# Patient Record
Sex: Male | Born: 1960 | Hispanic: Yes | Marital: Single | Smoking: Never smoker
Health system: Southern US, Community
[De-identification: ages and names within clinical notes are randomized; demographics above are authoritative.]

## PROBLEM LIST (undated history)

## (undated) DIAGNOSIS — I5032 Chronic diastolic (congestive) heart failure: Secondary | ICD-10-CM

## (undated) DIAGNOSIS — I10 Essential (primary) hypertension: Secondary | ICD-10-CM

## (undated) DIAGNOSIS — E039 Hypothyroidism, unspecified: Secondary | ICD-10-CM

## (undated) DIAGNOSIS — E119 Type 2 diabetes mellitus without complications: Secondary | ICD-10-CM

## (undated) DIAGNOSIS — I2510 Atherosclerotic heart disease of native coronary artery without angina pectoris: Secondary | ICD-10-CM

## (undated) DIAGNOSIS — G4733 Obstructive sleep apnea (adult) (pediatric): Secondary | ICD-10-CM

## (undated) DIAGNOSIS — D649 Anemia, unspecified: Secondary | ICD-10-CM

## (undated) DIAGNOSIS — I4819 Other persistent atrial fibrillation: Secondary | ICD-10-CM

## (undated) DIAGNOSIS — E785 Hyperlipidemia, unspecified: Secondary | ICD-10-CM

## (undated) DIAGNOSIS — N183 Chronic kidney disease, stage 3 unspecified: Secondary | ICD-10-CM

## (undated) DIAGNOSIS — I712 Thoracic aortic aneurysm, without rupture, unspecified: Secondary | ICD-10-CM

## (undated) DIAGNOSIS — I4510 Unspecified right bundle-branch block: Secondary | ICD-10-CM

## (undated) DIAGNOSIS — I480 Paroxysmal atrial fibrillation: Secondary | ICD-10-CM

## (undated) DIAGNOSIS — I358 Other nonrheumatic aortic valve disorders: Secondary | ICD-10-CM

## (undated) DIAGNOSIS — G8929 Other chronic pain: Secondary | ICD-10-CM

## (undated) DIAGNOSIS — R7989 Other specified abnormal findings of blood chemistry: Secondary | ICD-10-CM

## (undated) HISTORY — PX: CORONARY ARTERY BYPASS GRAFT: SHX141

## (undated) HISTORY — PX: AORTIC VALVE REPLACEMENT: SHX41

---
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
## 2012-07-03 LAB — COMPREHENSIVE METABOLIC PANEL
Albumin: 3.6 g/dL (ref 3.4–5.0)
Alkaline Phosphatase: 118 U/L (ref 50–136)
Anion Gap: 11 (ref 7–16)
Calcium, Total: 7.9 mg/dL — ABNORMAL LOW (ref 8.5–10.1)
Chloride: 109 mmol/L — ABNORMAL HIGH (ref 98–107)
EGFR (African American): >60
EGFR (Non-African Amer.): >60
Glucose: 130 mg/dL — ABNORMAL HIGH (ref 65–99)
Osmolality: 286 (ref 275–301)
Potassium: 3.4 mmol/L — ABNORMAL LOW (ref 3.5–5.1)
SGOT(AST): 29 U/L (ref 15–37)
Sodium: 142 mmol/L (ref 136–145)
Total Protein: 7.4 g/dL (ref 6.4–8.2)

## 2012-07-03 LAB — TROPONIN I: Troponin-I: 0.05 ng/mL

## 2012-07-03 LAB — PRO B NATRIURETIC PEPTIDE: B-Type Natriuretic Peptide: 1026 pg/mL — ABNORMAL HIGH (ref 0–125)

## 2012-07-03 LAB — CBC
HCT: 37.1 % — ABNORMAL LOW (ref 40.0–52.0)
HGB: 12.3 g/dL — ABNORMAL LOW (ref 13.0–18.0)
MCHC: 33.2 g/dL (ref 32.0–36.0)
MCV: 89 fL (ref 80–100)
RBC: 4.16 10*6/uL — ABNORMAL LOW (ref 4.40–5.90)
WBC: 18.9 10*3/uL — ABNORMAL HIGH (ref 3.8–10.6)

## 2012-07-03 LAB — CK TOTAL AND CKMB (NOT AT ARMC): CK, Total: 311 U/L — ABNORMAL HIGH (ref 35–232)

## 2012-07-04 ENCOUNTER — Inpatient Hospital Stay: Payer: Self-pay | Admitting: Family Medicine

## 2012-07-04 LAB — CBC WITH DIFFERENTIAL/PLATELET
Basophil #: 0.1 10*3/uL (ref 0.0–0.1)
Eosinophil %: 1.1 %
HCT: 34.1 % — ABNORMAL LOW (ref 40.0–52.0)
HGB: 11.2 g/dL — ABNORMAL LOW (ref 13.0–18.0)
Lymphocyte %: 13.1 %
MCHC: 32.8 g/dL (ref 32.0–36.0)
MCV: 89 fL (ref 80–100)
Monocyte %: 7.9 %
Neutrophil #: 12.5 10*3/uL — ABNORMAL HIGH (ref 1.4–6.5)
Neutrophil %: 77.4 %
RBC: 3.84 10*6/uL — ABNORMAL LOW (ref 4.40–5.90)
RDW: 14.2 % (ref 11.5–14.5)
WBC: 16.1 10*3/uL — ABNORMAL HIGH (ref 3.8–10.6)

## 2012-07-04 LAB — COMPREHENSIVE METABOLIC PANEL
Alkaline Phosphatase: 112 U/L (ref 50–136)
Calcium, Total: 7.7 mg/dL — ABNORMAL LOW (ref 8.5–10.1)
Co2: 28 mmol/L (ref 21–32)
Creatinine: 1.12 mg/dL (ref 0.60–1.30)
EGFR (African American): >60
EGFR (Non-African Amer.): >60
Glucose: 99 mg/dL (ref 65–99)
Osmolality: 282 (ref 275–301)
Potassium: 3.2 mmol/L — ABNORMAL LOW (ref 3.5–5.1)
Sodium: 141 mmol/L (ref 136–145)

## 2012-07-04 LAB — LIPID PANEL
Cholesterol: 126 mg/dL (ref 0–200)
HDL Cholesterol: 24 mg/dL — ABNORMAL LOW (ref 40–60)
Triglycerides: 75 mg/dL (ref 0–200)

## 2012-07-04 LAB — TROPONIN I
Troponin-I: 0.36 ng/mL — ABNORMAL HIGH
Troponin-I: 1.7 ng/mL — ABNORMAL HIGH

## 2012-07-04 LAB — CK TOTAL AND CKMB (NOT AT ARMC)
CK, Total: 298 U/L — ABNORMAL HIGH (ref 35–232)
CK-MB: 4.2 ng/mL — ABNORMAL HIGH (ref 0.5–3.6)

## 2012-07-04 LAB — TSH: Thyroid Stimulating Horm: 1.37 u[IU]/mL

## 2012-07-05 LAB — CBC WITH DIFFERENTIAL/PLATELET
Eosinophil #: 0.8 10*3/uL — ABNORMAL HIGH (ref 0.0–0.7)
Eosinophil %: 6.7 %
HCT: 36.6 % — ABNORMAL LOW (ref 40.0–52.0)
HGB: 12.2 g/dL — ABNORMAL LOW (ref 13.0–18.0)
MCV: 88 fL (ref 80–100)
Monocyte #: 0.8 x10 3/mm (ref 0.2–1.0)
Monocyte %: 6.5 %
Neutrophil %: 75.1 %
Platelet: 250 10*3/uL (ref 150–440)
RDW: 14.1 % (ref 11.5–14.5)
WBC: 12.1 10*3/uL — ABNORMAL HIGH (ref 3.8–10.6)

## 2012-07-05 LAB — CK TOTAL AND CKMB (NOT AT ARMC): CK-MB: 1.7 ng/mL (ref 0.5–3.6)

## 2012-07-05 LAB — BASIC METABOLIC PANEL
Anion Gap: 6 — ABNORMAL LOW (ref 7–16)
Calcium, Total: 7.9 mg/dL — ABNORMAL LOW (ref 8.5–10.1)
Chloride: 98 mmol/L (ref 98–107)
Co2: 32 mmol/L (ref 21–32)
Glucose: 109 mg/dL — ABNORMAL HIGH (ref 65–99)
Osmolality: 273 (ref 275–301)
Sodium: 136 mmol/L (ref 136–145)

## 2012-07-05 LAB — HEMOGLOBIN
HGB: 12.3 g/dL — ABNORMAL LOW (ref 13.0–18.0)
HGB: 12.5 g/dL — ABNORMAL LOW (ref 13.0–18.0)

## 2012-07-05 LAB — LIPID PANEL
Cholesterol: 114 mg/dL (ref 0–200)
Ldl Cholesterol, Calc: 67 mg/dL (ref 0–100)
VLDL Cholesterol, Calc: 21 mg/dL (ref 5–40)

## 2012-07-05 LAB — TROPONIN I: Troponin-I: 1.1 ng/mL — ABNORMAL HIGH

## 2012-07-05 LAB — POTASSIUM: Potassium: 3.1 mmol/L — ABNORMAL LOW (ref 3.5–5.1)

## 2012-07-06 LAB — BASIC METABOLIC PANEL
Anion Gap: 7 (ref 7–16)
BUN: 11 mg/dL (ref 7–18)
Chloride: 104 mmol/L (ref 98–107)
Co2: 29 mmol/L (ref 21–32)
Creatinine: 0.99 mg/dL (ref 0.60–1.30)
EGFR (Non-African Amer.): >60
Osmolality: 279 (ref 275–301)
Potassium: 3.4 mmol/L — ABNORMAL LOW (ref 3.5–5.1)

## 2012-07-06 LAB — MAGNESIUM: Magnesium: 2.3 mg/dL

## 2012-07-06 LAB — HEMOGLOBIN: HGB: 12.4 g/dL — ABNORMAL LOW (ref 13.0–18.0)

## 2012-07-09 LAB — CULTURE, BLOOD (SINGLE)

## 2012-10-21 ENCOUNTER — Inpatient Hospital Stay: Payer: Self-pay | Admitting: Internal Medicine

## 2012-10-21 LAB — CBC
HGB: 12.4 g/dL — ABNORMAL LOW (ref 13.0–18.0)
MCH: 29.2 pg (ref 26.0–34.0)
Platelet: 205 10*3/uL (ref 150–440)
RBC: 4.24 10*6/uL — ABNORMAL LOW (ref 4.40–5.90)
RDW: 14.1 % (ref 11.5–14.5)
WBC: 13.7 10*3/uL — ABNORMAL HIGH (ref 3.8–10.6)

## 2012-10-21 LAB — COMPREHENSIVE METABOLIC PANEL
BUN: 14 mg/dL (ref 7–18)
Calcium, Total: 8.5 mg/dL (ref 8.5–10.1)
Co2: 28 mmol/L (ref 21–32)
Creatinine: 1.2 mg/dL (ref 0.60–1.30)
EGFR (African American): >60
Glucose: 118 mg/dL — ABNORMAL HIGH (ref 65–99)
SGPT (ALT): 18 U/L (ref 12–78)
Total Protein: 7.4 g/dL (ref 6.4–8.2)

## 2012-10-21 LAB — HEMOGLOBIN A1C: Hemoglobin A1C: 5.9 % (ref 4.2–6.3)

## 2012-10-21 LAB — PROTIME-INR: INR: 1.1

## 2012-10-21 LAB — CK TOTAL AND CKMB (NOT AT ARMC): CK-MB: 1.4 ng/mL (ref 0.5–3.6)

## 2012-10-21 LAB — LIPID PANEL
HDL Cholesterol: 31 mg/dL — ABNORMAL LOW (ref 40–60)
Triglycerides: 87 mg/dL (ref 0–200)
VLDL Cholesterol, Calc: 17 mg/dL (ref 5–40)

## 2012-10-21 LAB — TROPONIN I: Troponin-I: 0.31 ng/mL — ABNORMAL HIGH

## 2012-10-21 LAB — PRO B NATRIURETIC PEPTIDE: B-Type Natriuretic Peptide: 4511 pg/mL — ABNORMAL HIGH (ref 0–125)

## 2012-10-22 LAB — CBC WITH DIFFERENTIAL/PLATELET
Basophil %: 0.1 %
Eosinophil #: 0 10*3/uL (ref 0.0–0.7)
HCT: 37.2 % — ABNORMAL LOW (ref 40.0–52.0)
Lymphocyte #: 0.9 10*3/uL — ABNORMAL LOW (ref 1.0–3.6)
MCH: 29.2 pg (ref 26.0–34.0)
Monocyte #: 0.9 x10 3/mm (ref 0.2–1.0)
Monocyte %: 5.8 %
Neutrophil #: 14.4 10*3/uL — ABNORMAL HIGH (ref 1.4–6.5)
RBC: 4.39 10*6/uL — ABNORMAL LOW (ref 4.40–5.90)
RDW: 14.4 % (ref 11.5–14.5)
WBC: 16.2 10*3/uL — ABNORMAL HIGH (ref 3.8–10.6)

## 2012-10-22 LAB — BASIC METABOLIC PANEL
Anion Gap: 9 (ref 7–16)
Chloride: 102 mmol/L (ref 98–107)
Co2: 26 mmol/L (ref 21–32)
Creatinine: 0.92 mg/dL (ref 0.60–1.30)
EGFR (Non-African Amer.): >60
Glucose: 138 mg/dL — ABNORMAL HIGH (ref 65–99)

## 2012-10-22 LAB — CK TOTAL AND CKMB (NOT AT ARMC)
CK, Total: 76 U/L (ref 35–232)
CK-MB: 0.6 ng/mL (ref 0.5–3.6)

## 2012-12-04 ENCOUNTER — Inpatient Hospital Stay: Payer: Self-pay | Admitting: Internal Medicine

## 2012-12-04 LAB — COMPREHENSIVE METABOLIC PANEL
Albumin: 4 g/dL (ref 3.4–5.0)
Alkaline Phosphatase: 110 U/L (ref 50–136)
Anion Gap: 7 (ref 7–16)
BUN: 14 mg/dL (ref 7–18)
Bilirubin,Total: 0.6 mg/dL (ref 0.2–1.0)
Calcium, Total: 8.9 mg/dL (ref 8.5–10.1)
Chloride: 102 mmol/L (ref 98–107)
EGFR (African American): >60
Osmolality: 278 (ref 275–301)
Potassium: 3.5 mmol/L (ref 3.5–5.1)
SGOT(AST): 24 U/L (ref 15–37)
SGPT (ALT): 26 U/L (ref 12–78)
Sodium: 137 mmol/L (ref 136–145)
Total Protein: 7.8 g/dL (ref 6.4–8.2)

## 2012-12-04 LAB — TROPONIN I
Troponin-I: <0.02 ng/mL
Troponin-I: 0.16 ng/mL — ABNORMAL HIGH

## 2012-12-04 LAB — CK TOTAL AND CKMB (NOT AT ARMC)
CK, Total: 74 U/L (ref 35–232)
CK-MB: 1.4 ng/mL (ref 0.5–3.6)
CK-MB: 1.3 ng/mL (ref 0.5–3.6)

## 2012-12-04 LAB — PROTIME-INR: Prothrombin Time: 13.3 secs (ref 11.5–14.7)

## 2012-12-04 LAB — APTT: Activated PTT: 48.5 secs — ABNORMAL HIGH (ref 23.6–35.9)

## 2012-12-04 LAB — CBC
HGB: 14.4 g/dL (ref 13.0–18.0)
MCH: 28.7 pg (ref 26.0–34.0)
MCHC: 33.5 g/dL (ref 32.0–36.0)
Platelet: 239 10*3/uL (ref 150–440)
RDW: 13.6 % (ref 11.5–14.5)

## 2012-12-04 LAB — PRO B NATRIURETIC PEPTIDE: B-Type Natriuretic Peptide: 2563 pg/mL — ABNORMAL HIGH (ref 0–125)

## 2012-12-04 LAB — HEMOGLOBIN A1C: Hemoglobin A1C: 6.2 % (ref 4.2–6.3)

## 2012-12-05 LAB — CBC WITH DIFFERENTIAL/PLATELET
Basophil #: 0.1 10*3/uL (ref 0.0–0.1)
Basophil %: 1 %
Eosinophil %: 11.9 %
HGB: 12.8 g/dL — ABNORMAL LOW (ref 13.0–18.0)
Lymphocyte %: 26.2 %
MCH: 28.7 pg (ref 26.0–34.0)
Monocyte #: 0.6 x10 3/mm (ref 0.2–1.0)
Monocyte %: 7.7 %
Neutrophil #: 4.2 10*3/uL (ref 1.4–6.5)
Neutrophil %: 53.2 %
RBC: 4.45 10*6/uL (ref 4.40–5.90)
RDW: 13.3 % (ref 11.5–14.5)
WBC: 7.8 10*3/uL (ref 3.8–10.6)

## 2012-12-05 LAB — URINALYSIS, COMPLETE
Blood: NEGATIVE
Glucose,UR: NEGATIVE mg/dL (ref 0–75)
Ketone: NEGATIVE
Leukocyte Esterase: NEGATIVE
Ph: 5 (ref 4.5–8.0)
Protein: NEGATIVE
RBC,UR: 2 /HPF (ref 0–5)
Squamous Epithelial: <1

## 2012-12-05 LAB — BASIC METABOLIC PANEL
Anion Gap: 6 — ABNORMAL LOW (ref 7–16)
Calcium, Total: 8.3 mg/dL — ABNORMAL LOW (ref 8.5–10.1)
Creatinine: 1.1 mg/dL (ref 0.60–1.30)
Potassium: 3.1 mmol/L — ABNORMAL LOW (ref 3.5–5.1)
Sodium: 138 mmol/L (ref 136–145)

## 2012-12-05 LAB — CK TOTAL AND CKMB (NOT AT ARMC): CK, Total: 69 U/L (ref 35–232)

## 2012-12-05 LAB — CK-MB: CK-MB: 0.9 ng/mL (ref 0.5–3.6)

## 2012-12-05 LAB — TROPONIN I
Troponin-I: 0.12 ng/mL — ABNORMAL HIGH
Troponin-I: 0.05 ng/mL

## 2012-12-05 LAB — APTT: Activated PTT: 62.8 secs — ABNORMAL HIGH (ref 23.6–35.9)

## 2012-12-06 LAB — BASIC METABOLIC PANEL
Anion Gap: 4 — ABNORMAL LOW (ref 7–16)
Calcium, Total: 8.7 mg/dL (ref 8.5–10.1)
Creatinine: 1.01 mg/dL (ref 0.60–1.30)
Glucose: 114 mg/dL — ABNORMAL HIGH (ref 65–99)
Osmolality: 273 (ref 275–301)
Potassium: 3.4 mmol/L — ABNORMAL LOW (ref 3.5–5.1)

## 2012-12-06 LAB — CBC WITH DIFFERENTIAL/PLATELET
Eosinophil #: 1.2 10*3/uL — ABNORMAL HIGH (ref 0.0–0.7)
Eosinophil %: 14.7 %
Lymphocyte %: 19.9 %
MCH: 29.2 pg (ref 26.0–34.0)
Monocyte %: 9.5 %
Neutrophil #: 4.4 10*3/uL (ref 1.4–6.5)
Neutrophil %: 55 %
RDW: 13.3 % (ref 11.5–14.5)
WBC: 8 10*3/uL (ref 3.8–10.6)

## 2012-12-07 LAB — BASIC METABOLIC PANEL
Creatinine: 1 mg/dL (ref 0.60–1.30)
EGFR (African American): >60
EGFR (Non-African Amer.): >60
Potassium: 3.9 mmol/L (ref 3.5–5.1)
Sodium: 138 mmol/L (ref 136–145)

## 2012-12-07 LAB — CBC WITH DIFFERENTIAL/PLATELET
Basophil #: 0.1 10*3/uL (ref 0.0–0.1)
Basophil %: 0.6 %
Eosinophil #: 1.2 10*3/uL — ABNORMAL HIGH (ref 0.0–0.7)
Eosinophil %: 10.1 %
HCT: 32.2 % — ABNORMAL LOW (ref 40.0–52.0)
Lymphocyte #: 1.3 10*3/uL (ref 1.0–3.6)
Monocyte #: 0.7 x10 3/mm (ref 0.2–1.0)
Neutrophil %: 73.1 %

## 2012-12-07 LAB — APTT
Activated PTT: 51.3 secs — ABNORMAL HIGH (ref 23.6–35.9)
Activated PTT: 45.7 secs — ABNORMAL HIGH (ref 23.6–35.9)

## 2012-12-08 LAB — BASIC METABOLIC PANEL
Anion Gap: 5 — ABNORMAL LOW (ref 7–16)
BUN: 12 mg/dL (ref 7–18)
Co2: 29 mmol/L (ref 21–32)
Creatinine: 0.93 mg/dL (ref 0.60–1.30)
EGFR (Non-African Amer.): >60
Glucose: 105 mg/dL — ABNORMAL HIGH (ref 65–99)
Osmolality: 274 (ref 275–301)
Potassium: 3.8 mmol/L (ref 3.5–5.1)

## 2012-12-08 LAB — URINALYSIS, COMPLETE
Bilirubin,UR: NEGATIVE
Blood: NEGATIVE
Ketone: NEGATIVE
Leukocyte Esterase: NEGATIVE
Nitrite: NEGATIVE
RBC,UR: <1 /HPF (ref 0–5)
Specific Gravity: 1.015 (ref 1.003–1.030)
Squamous Epithelial: <1

## 2012-12-08 LAB — CBC WITH DIFFERENTIAL/PLATELET
HCT: 34.1 % — ABNORMAL LOW (ref 40.0–52.0)
HGB: 11.7 g/dL — ABNORMAL LOW (ref 13.0–18.0)
Lymphocyte #: 2.2 10*3/uL (ref 1.0–3.6)
MCH: 29.2 pg (ref 26.0–34.0)
MCHC: 34.3 g/dL (ref 32.0–36.0)
Monocyte #: 0.9 x10 3/mm (ref 0.2–1.0)
Monocyte %: 7.8 %
RBC: 4 10*6/uL — ABNORMAL LOW (ref 4.40–5.90)
WBC: 12.2 10*3/uL — ABNORMAL HIGH (ref 3.8–10.6)

## 2012-12-08 LAB — APTT
Activated PTT: 140.8 secs — ABNORMAL HIGH (ref 23.6–35.9)
Activated PTT: 111.2 secs — ABNORMAL HIGH (ref 23.6–35.9)

## 2012-12-08 LAB — PROTIME-INR
INR: 1.3
Prothrombin Time: 15.8 secs — ABNORMAL HIGH (ref 11.5–14.7)

## 2012-12-09 LAB — CBC WITH DIFFERENTIAL/PLATELET
Basophil #: 0.1 10*3/uL (ref 0.0–0.1)
Basophil #: 0.1 10*3/uL (ref 0.0–0.1)
Basophil %: 0.5 %
Eosinophil #: 1.2 10*3/uL — ABNORMAL HIGH (ref 0.0–0.7)
Eosinophil #: 1 10*3/uL — ABNORMAL HIGH (ref 0.0–0.7)
Eosinophil %: 8.8 %
Eosinophil %: 8.4 %
HCT: 34.3 % — ABNORMAL LOW (ref 40.0–52.0)
HGB: 11.9 g/dL — ABNORMAL LOW (ref 13.0–18.0)
HGB: 11.9 g/dL — ABNORMAL LOW (ref 13.0–18.0)
Lymphocyte #: 1.5 10*3/uL (ref 1.0–3.6)
Lymphocyte %: 12.6 %
Lymphocyte %: 13 %
MCH: 29.1 pg (ref 26.0–34.0)
MCHC: 34.6 g/dL (ref 32.0–36.0)
Monocyte #: 1 x10 3/mm (ref 0.2–1.0)
Monocyte #: 1 x10 3/mm (ref 0.2–1.0)
Monocyte %: 7.6 %
Monocyte %: 8.9 %
Neutrophil %: 70.5 %
Neutrophil %: 69.2 %
Platelet: 186 10*3/uL (ref 150–440)
Platelet: 188 10*3/uL (ref 150–440)
RBC: 4.08 10*6/uL — ABNORMAL LOW (ref 4.40–5.90)
RDW: 13.4 % (ref 11.5–14.5)
WBC: 11.8 10*3/uL — ABNORMAL HIGH (ref 3.8–10.6)

## 2012-12-09 LAB — PROTIME-INR
INR: 1.3
Prothrombin Time: 15.8 secs — ABNORMAL HIGH (ref 11.5–14.7)

## 2012-12-09 LAB — APTT: Activated PTT: 116.2 secs — ABNORMAL HIGH (ref 23.6–35.9)

## 2012-12-10 LAB — CBC WITH DIFFERENTIAL/PLATELET
Basophil #: 0.1 10*3/uL (ref 0.0–0.1)
Basophil %: 0.8 %
Eosinophil %: 10.9 %
HCT: 35.1 % — ABNORMAL LOW (ref 40.0–52.0)
Lymphocyte %: 14.2 %
MCH: 28.8 pg (ref 26.0–34.0)
MCHC: 34.2 g/dL (ref 32.0–36.0)
MCV: 84 fL (ref 80–100)
Neutrophil #: 7.5 10*3/uL — ABNORMAL HIGH (ref 1.4–6.5)
Neutrophil %: 64.8 %
Platelet: 189 10*3/uL (ref 150–440)
RBC: 4.17 10*6/uL — ABNORMAL LOW (ref 4.40–5.90)
WBC: 11.6 10*3/uL — ABNORMAL HIGH (ref 3.8–10.6)

## 2012-12-10 LAB — APTT: Activated PTT: 103.4 secs — ABNORMAL HIGH (ref 23.6–35.9)

## 2012-12-11 LAB — CBC WITH DIFFERENTIAL/PLATELET
Basophil #: 0.1 10*3/uL (ref 0.0–0.1)
Basophil %: 1.1 %
HCT: 34.6 % — ABNORMAL LOW (ref 40.0–52.0)
MCHC: 34.6 g/dL (ref 32.0–36.0)
Neutrophil #: 6.5 10*3/uL (ref 1.4–6.5)
Neutrophil %: 59.9 %
Platelet: 199 10*3/uL (ref 150–440)
RBC: 4.1 10*6/uL — ABNORMAL LOW (ref 4.40–5.90)
RDW: 13.5 % (ref 11.5–14.5)
WBC: 10.8 10*3/uL — ABNORMAL HIGH (ref 3.8–10.6)

## 2012-12-11 LAB — APTT: Activated PTT: 111.2 secs — ABNORMAL HIGH (ref 23.6–35.9)

## 2012-12-12 LAB — PROTIME-INR
INR: 1.9
INR: 2
Prothrombin Time: 22.2 s — ABNORMAL HIGH

## 2012-12-12 LAB — APTT: Activated PTT: 107.5 secs — ABNORMAL HIGH (ref 23.6–35.9)

## 2013-02-24 LAB — COMPREHENSIVE METABOLIC PANEL
Albumin: 4.1 g/dL (ref 3.4–5.0)
Alkaline Phosphatase: 127 U/L (ref 50–136)
Anion Gap: 5 — ABNORMAL LOW (ref 7–16)
BUN: 16 mg/dL (ref 7–18)
Bilirubin,Total: 0.2 mg/dL (ref 0.2–1.0)
Calcium, Total: 9.5 mg/dL (ref 8.5–10.1)
Co2: 28 mmol/L (ref 21–32)
EGFR (African American): >60
EGFR (Non-African Amer.): >60
Glucose: 187 mg/dL — ABNORMAL HIGH (ref 65–99)
Potassium: 3.4 mmol/L — ABNORMAL LOW (ref 3.5–5.1)
SGOT(AST): 24 U/L (ref 15–37)
SGPT (ALT): 31 U/L (ref 12–78)
Sodium: 139 mmol/L (ref 136–145)

## 2013-02-24 LAB — CBC
HCT: 39.7 % — ABNORMAL LOW (ref 40.0–52.0)
MCV: 86 fL (ref 80–100)
Platelet: 259 10*3/uL (ref 150–440)
RBC: 4.65 10*6/uL (ref 4.40–5.90)
RDW: 14.1 % (ref 11.5–14.5)

## 2013-02-25 ENCOUNTER — Inpatient Hospital Stay: Payer: Self-pay | Admitting: Family Medicine

## 2013-02-25 ENCOUNTER — Ambulatory Visit: Payer: Self-pay | Admitting: Hospice and Palliative Medicine

## 2013-02-25 LAB — CK TOTAL AND CKMB (NOT AT ARMC)
CK, Total: 102 U/L (ref 35–232)
CK, Total: 71 U/L (ref 35–232)
CK, Total: 63 U/L (ref 35–232)
CK-MB: 1 ng/mL (ref 0.5–3.6)
CK-MB: 1.2 ng/mL (ref 0.5–3.6)
CK-MB: 1 ng/mL (ref 0.5–3.6)

## 2013-02-25 LAB — TROPONIN I: Troponin-I: 0.07 ng/mL — ABNORMAL HIGH

## 2013-02-25 LAB — PROTIME-INR
INR: 1
Prothrombin Time: 13.2 secs (ref 11.5–14.7)

## 2013-02-26 LAB — BASIC METABOLIC PANEL
Anion Gap: 7 (ref 7–16)
Co2: 26 mmol/L (ref 21–32)
Creatinine: 0.98 mg/dL (ref 0.60–1.30)
EGFR (African American): >60
EGFR (Non-African Amer.): >60
Osmolality: 276 (ref 275–301)
Potassium: 3.5 mmol/L (ref 3.5–5.1)

## 2013-02-26 LAB — CBC WITH DIFFERENTIAL/PLATELET
Basophil #: 0.1 10*3/uL (ref 0.0–0.1)
Basophil %: 1.1 %
Eosinophil #: 1 10*3/uL — ABNORMAL HIGH (ref 0.0–0.7)
Eosinophil %: 8.1 %
HGB: 13.2 g/dL (ref 13.0–18.0)
Lymphocyte #: 2.7 10*3/uL (ref 1.0–3.6)
Lymphocyte %: 21.5 %
MCH: 29.5 pg (ref 26.0–34.0)
MCHC: 34.7 g/dL (ref 32.0–36.0)
MCV: 85 fL (ref 80–100)
Monocyte #: 0.8 x10 3/mm (ref 0.2–1.0)
Monocyte %: 6.3 %
Neutrophil #: 7.9 10*3/uL — ABNORMAL HIGH (ref 1.4–6.5)

## 2013-02-26 LAB — PROTIME-INR: INR: 1.1

## 2013-02-27 LAB — PROTIME-INR
INR: 1.1
Prothrombin Time: 14.7 secs (ref 11.5–14.7)

## 2013-03-19 ENCOUNTER — Ambulatory Visit: Payer: Self-pay | Admitting: Hospice and Palliative Medicine

## 2013-03-25 ENCOUNTER — Inpatient Hospital Stay: Payer: Self-pay | Admitting: Internal Medicine

## 2013-03-25 LAB — CBC
HCT: 41.8 % (ref 40.0–52.0)
HGB: 14.3 g/dL (ref 13.0–18.0)
MCH: 29 pg (ref 26.0–34.0)
RBC: 4.94 10*6/uL (ref 4.40–5.90)
RDW: 13.7 % (ref 11.5–14.5)

## 2013-03-25 LAB — CK TOTAL AND CKMB (NOT AT ARMC)
CK, Total: 165 U/L
CK-MB: 1.1 ng/mL

## 2013-03-25 LAB — COMPREHENSIVE METABOLIC PANEL
Anion Gap: 8 (ref 7–16)
BUN: 10 mg/dL (ref 7–18)
Bilirubin,Total: 0.8 mg/dL (ref 0.2–1.0)
Calcium, Total: 9 mg/dL (ref 8.5–10.1)
Co2: 29 mmol/L (ref 21–32)
SGOT(AST): 23 U/L (ref 15–37)
SGPT (ALT): 33 U/L (ref 12–78)
Sodium: 139 mmol/L (ref 136–145)
Total Protein: 8.2 g/dL (ref 6.4–8.2)

## 2013-03-25 LAB — PRO B NATRIURETIC PEPTIDE: B-Type Natriuretic Peptide: 2206 pg/mL — ABNORMAL HIGH (ref 0–125)

## 2013-03-25 LAB — PROTIME-INR
INR: 1.4
Prothrombin Time: 17.6 secs — ABNORMAL HIGH (ref 11.5–14.7)

## 2013-03-25 LAB — TROPONIN I: Troponin-I: 0.04 ng/mL

## 2013-03-26 LAB — URINALYSIS, COMPLETE
Bilirubin,UR: NEGATIVE
Squamous Epithelial: <1
WBC UR: 14 /HPF (ref 0–5)

## 2013-03-26 LAB — CBC WITH DIFFERENTIAL/PLATELET
Basophil %: 1.2 %
Eosinophil #: 0.8 10*3/uL — ABNORMAL HIGH (ref 0.0–0.7)
HGB: 14 g/dL (ref 13.0–18.0)
Lymphocyte #: 2.2 10*3/uL (ref 1.0–3.6)
Lymphocyte %: 27 %
MCH: 29.6 pg (ref 26.0–34.0)
Monocyte #: 0.7 x10 3/mm (ref 0.2–1.0)
Neutrophil %: 53.1 %
Platelet: 226 10*3/uL (ref 150–440)
WBC: 8.2 10*3/uL (ref 3.8–10.6)

## 2013-03-26 LAB — BASIC METABOLIC PANEL
Anion Gap: 5 — ABNORMAL LOW (ref 7–16)
BUN: 17 mg/dL (ref 7–18)
Calcium, Total: 8.7 mg/dL (ref 8.5–10.1)
Creatinine: 1.04 mg/dL (ref 0.60–1.30)
EGFR (Non-African Amer.): >60
Glucose: 117 mg/dL — ABNORMAL HIGH (ref 65–99)
Osmolality: 278 (ref 275–301)
Potassium: 3.3 mmol/L — ABNORMAL LOW (ref 3.5–5.1)
Sodium: 138 mmol/L (ref 136–145)

## 2013-03-26 LAB — PROTIME-INR
INR: 1.5
Prothrombin Time: 18.4 secs — ABNORMAL HIGH (ref 11.5–14.7)

## 2013-03-27 LAB — BASIC METABOLIC PANEL
Chloride: 106 mmol/L (ref 98–107)
Creatinine: 1.08 mg/dL (ref 0.60–1.30)
EGFR (Non-African Amer.): >60
Osmolality: 281 (ref 275–301)
Sodium: 139 mmol/L (ref 136–145)

## 2013-03-27 LAB — PROTIME-INR: Prothrombin Time: 20.6 secs — ABNORMAL HIGH (ref 11.5–14.7)

## 2013-05-06 ENCOUNTER — Emergency Department: Payer: Self-pay | Admitting: Emergency Medicine

## 2013-05-06 LAB — CBC
HCT: 41.9 % (ref 40.0–52.0)
HGB: 14.1 g/dL (ref 13.0–18.0)
MCH: 28.8 pg (ref 26.0–34.0)
MCHC: 33.6 g/dL (ref 32.0–36.0)
Platelet: 260 10*3/uL (ref 150–440)
RBC: 4.9 10*6/uL (ref 4.40–5.90)
RDW: 13.9 % (ref 11.5–14.5)
WBC: 10.4 10*3/uL (ref 3.8–10.6)

## 2013-05-06 LAB — COMPREHENSIVE METABOLIC PANEL
Alkaline Phosphatase: 100 U/L
Anion Gap: 6 — ABNORMAL LOW (ref 7–16)
BUN: 21 mg/dL — ABNORMAL HIGH (ref 7–18)
Bilirubin,Total: 0.4 mg/dL (ref 0.2–1.0)
Calcium, Total: 8.8 mg/dL (ref 8.5–10.1)
Chloride: 102 mmol/L (ref 98–107)
Co2: 30 mmol/L (ref 21–32)
Creatinine: 1.79 mg/dL — ABNORMAL HIGH (ref 0.60–1.30)
EGFR (African American): 50 — ABNORMAL LOW
EGFR (Non-African Amer.): 43 — ABNORMAL LOW
Glucose: 133 mg/dL — ABNORMAL HIGH (ref 65–99)
Osmolality: 281 (ref 275–301)
SGPT (ALT): 34 U/L (ref 12–78)
Sodium: 138 mmol/L (ref 136–145)

## 2013-05-06 LAB — PROTIME-INR: Prothrombin Time: 13.4 secs (ref 11.5–14.7)

## 2013-05-06 LAB — CK TOTAL AND CKMB (NOT AT ARMC): CK-MB: 1.4 ng/mL (ref 0.5–3.6)

## 2013-05-06 LAB — TROPONIN I: Troponin-I: 0.05 ng/mL

## 2013-06-23 ENCOUNTER — Emergency Department: Payer: Self-pay | Admitting: Emergency Medicine

## 2013-08-07 ENCOUNTER — Inpatient Hospital Stay: Payer: Self-pay | Admitting: Family Medicine

## 2013-08-07 LAB — COMPREHENSIVE METABOLIC PANEL
ALBUMIN: 3.6 g/dL (ref 3.4–5.0)
ALK PHOS: 95 U/L
AST: 35 U/L (ref 15–37)
Anion Gap: 7 (ref 7–16)
BUN: 15 mg/dL (ref 7–18)
Bilirubin,Total: 0.6 mg/dL (ref 0.2–1.0)
CHLORIDE: 106 mmol/L (ref 98–107)
CO2: 28 mmol/L (ref 21–32)
CREATININE: 1.15 mg/dL (ref 0.60–1.30)
Calcium, Total: 8.3 mg/dL — ABNORMAL LOW (ref 8.5–10.1)
EGFR (Non-African Amer.): >60
Glucose: 117 mg/dL — ABNORMAL HIGH (ref 65–99)
Osmolality: 283 (ref 275–301)
Potassium: 3.5 mmol/L (ref 3.5–5.1)
SGPT (ALT): 39 U/L (ref 12–78)
Sodium: 141 mmol/L (ref 136–145)
Total Protein: 7.5 g/dL (ref 6.4–8.2)

## 2013-08-07 LAB — APTT
ACTIVATED PTT: 80.6 s — AB (ref 23.6–35.9)
Activated PTT: 25.8 secs (ref 23.6–35.9)

## 2013-08-07 LAB — CBC
HCT: 38.1 % — ABNORMAL LOW (ref 40.0–52.0)
HGB: 12.7 g/dL — ABNORMAL LOW (ref 13.0–18.0)
MCH: 29 pg (ref 26.0–34.0)
MCHC: 33.2 g/dL (ref 32.0–36.0)
MCV: 87 fL (ref 80–100)
Platelet: 215 10*3/uL (ref 150–440)
RBC: 4.37 10*6/uL — AB (ref 4.40–5.90)
RDW: 14 % (ref 11.5–14.5)
WBC: 17.5 10*3/uL — ABNORMAL HIGH (ref 3.8–10.6)

## 2013-08-07 LAB — URINALYSIS, COMPLETE
Bilirubin,UR: NEGATIVE
Glucose,UR: NEGATIVE mg/dL (ref 0–75)
KETONE: NEGATIVE
Leukocyte Esterase: NEGATIVE
NITRITE: NEGATIVE
Ph: 5 (ref 4.5–8.0)
RBC,UR: 13 /HPF (ref 0–5)
Specific Gravity: 1.017 (ref 1.003–1.030)
Squamous Epithelial: 1
WBC UR: 4 /HPF (ref 0–5)

## 2013-08-07 LAB — CK TOTAL AND CKMB (NOT AT ARMC)
CK, Total: 186 U/L
CK-MB: 1.6 ng/mL (ref 0.5–3.6)

## 2013-08-07 LAB — PROTIME-INR
INR: 1.1
Prothrombin Time: 14 secs (ref 11.5–14.7)

## 2013-08-07 LAB — TROPONIN I
TROPONIN-I: 0.08 ng/mL — AB
Troponin-I: 0.08 ng/mL — ABNORMAL HIGH
Troponin-I: 0.11 ng/mL — ABNORMAL HIGH

## 2013-08-07 LAB — TSH: THYROID STIMULATING HORM: 1.58 u[IU]/mL

## 2013-08-07 LAB — PRO B NATRIURETIC PEPTIDE: B-Type Natriuretic Peptide: 2326 pg/mL — ABNORMAL HIGH (ref 0–125)

## 2013-08-08 LAB — BASIC METABOLIC PANEL
ANION GAP: 4 — AB (ref 7–16)
BUN: 21 mg/dL — ABNORMAL HIGH (ref 7–18)
CALCIUM: 7.3 mg/dL — AB (ref 8.5–10.1)
CREATININE: 1.92 mg/dL — AB (ref 0.60–1.30)
Chloride: 103 mmol/L (ref 98–107)
Co2: 32 mmol/L (ref 21–32)
GFR CALC AF AMER: 46 — AB
GFR CALC NON AF AMER: 40 — AB
Glucose: 145 mg/dL — ABNORMAL HIGH (ref 65–99)
Osmolality: 283 (ref 275–301)
Potassium: 3.3 mmol/L — ABNORMAL LOW (ref 3.5–5.1)
SODIUM: 139 mmol/L (ref 136–145)

## 2013-08-08 LAB — CBC WITH DIFFERENTIAL/PLATELET
BASOS ABS: 0.2 10*3/uL — AB (ref 0.0–0.1)
Basophil %: 1.1 %
Eosinophil #: 0.6 10*3/uL (ref 0.0–0.7)
Eosinophil %: 4.5 %
HCT: 32.7 % — AB (ref 40.0–52.0)
HGB: 11.1 g/dL — ABNORMAL LOW (ref 13.0–18.0)
LYMPHS ABS: 2 10*3/uL (ref 1.0–3.6)
Lymphocyte %: 14 %
MCH: 29.9 pg (ref 26.0–34.0)
MCHC: 33.8 g/dL (ref 32.0–36.0)
MCV: 89 fL (ref 80–100)
MONOS PCT: 6.8 %
Monocyte #: 1 x10 3/mm (ref 0.2–1.0)
NEUTROS PCT: 73.6 %
Neutrophil #: 10.3 10*3/uL — ABNORMAL HIGH (ref 1.4–6.5)
Platelet: 176 10*3/uL (ref 150–440)
RBC: 3.69 10*6/uL — AB (ref 4.40–5.90)
RDW: 14.2 % (ref 11.5–14.5)
WBC: 14.1 10*3/uL — ABNORMAL HIGH (ref 3.8–10.6)

## 2013-08-08 LAB — LIPID PANEL
Cholesterol: 141 mg/dL (ref 0–200)
HDL Cholesterol: 22 mg/dL — ABNORMAL LOW (ref 40–60)
Triglycerides: 422 mg/dL — ABNORMAL HIGH (ref 0–200)

## 2013-08-08 LAB — MAGNESIUM: Magnesium: 1.6 mg/dL — ABNORMAL LOW

## 2013-08-08 LAB — APTT: Activated PTT: 65.4 secs — ABNORMAL HIGH (ref 23.6–35.9)

## 2013-08-08 LAB — TSH: THYROID STIMULATING HORM: 0.4 u[IU]/mL — AB

## 2013-08-09 LAB — BASIC METABOLIC PANEL
Anion Gap: 2 — ABNORMAL LOW (ref 7–16)
BUN: 14 mg/dL (ref 7–18)
CO2: 32 mmol/L (ref 21–32)
Calcium, Total: 7.7 mg/dL — ABNORMAL LOW (ref 8.5–10.1)
Chloride: 105 mmol/L (ref 98–107)
Creatinine: 1.51 mg/dL — ABNORMAL HIGH (ref 0.60–1.30)
EGFR (African American): >60
EGFR (Non-African Amer.): 53 — ABNORMAL LOW
GLUCOSE: 123 mg/dL — AB (ref 65–99)
Osmolality: 279 (ref 275–301)
Potassium: 3.1 mmol/L — ABNORMAL LOW (ref 3.5–5.1)
Sodium: 139 mmol/L (ref 136–145)

## 2013-08-09 LAB — APTT
ACTIVATED PTT: 46.2 s — AB (ref 23.6–35.9)
Activated PTT: 48.7 secs — ABNORMAL HIGH (ref 23.6–35.9)
Activated PTT: 51.3 secs — ABNORMAL HIGH (ref 23.6–35.9)

## 2013-08-09 LAB — CBC WITH DIFFERENTIAL/PLATELET
Basophil #: 0.1 10*3/uL (ref 0.0–0.1)
Basophil %: 0.6 %
EOS ABS: 0.7 10*3/uL (ref 0.0–0.7)
Eosinophil %: 7.9 %
HCT: 31.5 % — ABNORMAL LOW (ref 40.0–52.0)
HGB: 10.6 g/dL — ABNORMAL LOW (ref 13.0–18.0)
LYMPHS ABS: 1 10*3/uL (ref 1.0–3.6)
Lymphocyte %: 11.6 %
MCH: 29.5 pg (ref 26.0–34.0)
MCHC: 33.7 g/dL (ref 32.0–36.0)
MCV: 88 fL (ref 80–100)
Monocyte #: 0.7 x10 3/mm (ref 0.2–1.0)
Monocyte %: 7.6 %
NEUTROS ABS: 6.5 10*3/uL (ref 1.4–6.5)
Neutrophil %: 72.3 %
PLATELETS: 154 10*3/uL (ref 150–440)
RBC: 3.6 10*6/uL — AB (ref 4.40–5.90)
RDW: 14 % (ref 11.5–14.5)
WBC: 9 10*3/uL (ref 3.8–10.6)

## 2013-08-09 LAB — PHOSPHORUS: Phosphorus: 3.2 mg/dL (ref 2.5–4.9)

## 2013-08-09 LAB — MAGNESIUM: MAGNESIUM: 2.5 mg/dL — AB

## 2013-08-10 LAB — APTT
ACTIVATED PTT: 43.7 s — AB (ref 23.6–35.9)
Activated PTT: 46.9 secs — ABNORMAL HIGH (ref 23.6–35.9)
Activated PTT: 56.4 secs — ABNORMAL HIGH (ref 23.6–35.9)

## 2013-08-10 LAB — CBC WITH DIFFERENTIAL/PLATELET
BASOS ABS: 0.1 10*3/uL (ref 0.0–0.1)
Basophil %: 1 %
EOS ABS: 0.8 10*3/uL — AB (ref 0.0–0.7)
Eosinophil %: 6.5 %
HCT: 32.8 % — ABNORMAL LOW (ref 40.0–52.0)
HGB: 10.5 g/dL — ABNORMAL LOW (ref 13.0–18.0)
LYMPHS PCT: 7.2 %
Lymphocyte #: 0.8 10*3/uL — ABNORMAL LOW (ref 1.0–3.6)
MCH: 28.4 pg (ref 26.0–34.0)
MCHC: 32.2 g/dL (ref 32.0–36.0)
MCV: 88 fL (ref 80–100)
Monocyte #: 0.9 x10 3/mm (ref 0.2–1.0)
Monocyte %: 7.4 %
Neutrophil #: 9.1 10*3/uL — ABNORMAL HIGH (ref 1.4–6.5)
Neutrophil %: 77.9 %
Platelet: 182 10*3/uL (ref 150–440)
RBC: 3.7 10*6/uL — AB (ref 4.40–5.90)
RDW: 14.3 % (ref 11.5–14.5)
WBC: 11.7 10*3/uL — AB (ref 3.8–10.6)

## 2013-08-10 LAB — BASIC METABOLIC PANEL
ANION GAP: 3 — AB (ref 7–16)
BUN: 12 mg/dL (ref 7–18)
CO2: 32 mmol/L (ref 21–32)
CREATININE: 1.22 mg/dL (ref 0.60–1.30)
Calcium, Total: 7.7 mg/dL — ABNORMAL LOW (ref 8.5–10.1)
Chloride: 104 mmol/L (ref 98–107)
EGFR (Non-African Amer.): >60
Glucose: 142 mg/dL — ABNORMAL HIGH (ref 65–99)
Osmolality: 280 (ref 275–301)
Potassium: 3.3 mmol/L — ABNORMAL LOW (ref 3.5–5.1)
Sodium: 139 mmol/L (ref 136–145)

## 2013-08-10 LAB — POTASSIUM: Potassium: 3.9 mmol/L (ref 3.5–5.1)

## 2013-08-10 LAB — MAGNESIUM: Magnesium: 1.8 mg/dL

## 2013-08-10 LAB — PHOSPHORUS: PHOSPHORUS: 2.6 mg/dL (ref 2.5–4.9)

## 2013-08-11 LAB — URINALYSIS, COMPLETE
BILIRUBIN, UR: NEGATIVE
Glucose,UR: NEGATIVE mg/dL (ref 0–75)
Ketone: NEGATIVE
NITRITE: NEGATIVE
Ph: 5 (ref 4.5–8.0)
Protein: 30
Specific Gravity: 1.027 (ref 1.003–1.030)
Transitional Epi: <1

## 2013-08-11 LAB — BASIC METABOLIC PANEL
Anion Gap: 6 — ABNORMAL LOW (ref 7–16)
BUN: 19 mg/dL — AB (ref 7–18)
CALCIUM: 8.2 mg/dL — AB (ref 8.5–10.1)
CHLORIDE: 103 mmol/L (ref 98–107)
Co2: 32 mmol/L (ref 21–32)
Creatinine: 1.33 mg/dL — ABNORMAL HIGH (ref 0.60–1.30)
Glucose: 133 mg/dL — ABNORMAL HIGH (ref 65–99)
OSMOLALITY: 285 (ref 275–301)
POTASSIUM: 3.3 mmol/L — AB (ref 3.5–5.1)
Sodium: 141 mmol/L (ref 136–145)

## 2013-08-11 LAB — RAPID HIV-1/2 QL/CONFIRM: HIV-1/2,Rapid Ql: NEGATIVE

## 2013-08-11 LAB — APTT
Activated PTT: 58.2 secs — ABNORMAL HIGH (ref 23.6–35.9)
Activated PTT: 75.2 secs — ABNORMAL HIGH (ref 23.6–35.9)

## 2013-08-11 LAB — MAGNESIUM: Magnesium: 1.9 mg/dL

## 2013-08-12 LAB — CBC WITH DIFFERENTIAL/PLATELET
Basophil #: 0.1 10*3/uL (ref 0.0–0.1)
Basophil %: 0.5 %
Eosinophil #: 1 10*3/uL — ABNORMAL HIGH (ref 0.0–0.7)
Eosinophil %: 7.6 %
HCT: 32.7 % — ABNORMAL LOW (ref 40.0–52.0)
HGB: 10.5 g/dL — AB (ref 13.0–18.0)
LYMPHS PCT: 10.2 %
Lymphocyte #: 1.3 10*3/uL (ref 1.0–3.6)
MCH: 28.5 pg (ref 26.0–34.0)
MCHC: 32.2 g/dL (ref 32.0–36.0)
MCV: 89 fL (ref 80–100)
MONOS PCT: 9.5 %
Monocyte #: 1.2 x10 3/mm — ABNORMAL HIGH (ref 0.2–1.0)
NEUTROS ABS: 9.3 10*3/uL — AB (ref 1.4–6.5)
Neutrophil %: 72.2 %
PLATELETS: 214 10*3/uL (ref 150–440)
RBC: 3.69 10*6/uL — ABNORMAL LOW (ref 4.40–5.90)
RDW: 14.1 % (ref 11.5–14.5)
WBC: 12.8 10*3/uL — ABNORMAL HIGH (ref 3.8–10.6)

## 2013-08-12 LAB — EXPECTORATED SPUTUM ASSESSMENT W GRAM STAIN, RFLX TO RESP C

## 2013-08-12 LAB — BASIC METABOLIC PANEL
Anion Gap: 3 — ABNORMAL LOW (ref 7–16)
BUN: 20 mg/dL — ABNORMAL HIGH (ref 7–18)
CHLORIDE: 102 mmol/L (ref 98–107)
CREATININE: 1.16 mg/dL (ref 0.60–1.30)
Calcium, Total: 8 mg/dL — ABNORMAL LOW (ref 8.5–10.1)
Co2: 33 mmol/L — ABNORMAL HIGH (ref 21–32)
EGFR (African American): >60
EGFR (Non-African Amer.): >60
Glucose: 144 mg/dL — ABNORMAL HIGH (ref 65–99)
Osmolality: 281 (ref 275–301)
Potassium: 3.4 mmol/L — ABNORMAL LOW (ref 3.5–5.1)
SODIUM: 138 mmol/L (ref 136–145)

## 2013-08-12 LAB — POTASSIUM: POTASSIUM: 4.1 mmol/L (ref 3.5–5.1)

## 2013-08-12 LAB — VANCOMYCIN, TROUGH: Vancomycin, Trough: 8 ug/mL — ABNORMAL LOW (ref 10–20)

## 2013-08-12 LAB — APTT: ACTIVATED PTT: 74.5 s — AB (ref 23.6–35.9)

## 2013-08-12 LAB — CULTURE, BLOOD (SINGLE)

## 2013-08-12 LAB — URINE CULTURE

## 2013-08-13 LAB — BASIC METABOLIC PANEL
Anion Gap: 2 — ABNORMAL LOW (ref 7–16)
BUN: 23 mg/dL — ABNORMAL HIGH (ref 7–18)
CHLORIDE: 101 mmol/L (ref 98–107)
CO2: 33 mmol/L — AB (ref 21–32)
Calcium, Total: 8.1 mg/dL — ABNORMAL LOW (ref 8.5–10.1)
Creatinine: 1.02 mg/dL (ref 0.60–1.30)
EGFR (Non-African Amer.): >60
GLUCOSE: 173 mg/dL — AB (ref 65–99)
Osmolality: 280 (ref 275–301)
Potassium: 3.7 mmol/L (ref 3.5–5.1)
Sodium: 136 mmol/L (ref 136–145)

## 2013-08-13 LAB — PHOSPHORUS: Phosphorus: 2.7 mg/dL (ref 2.5–4.9)

## 2013-08-13 LAB — MAGNESIUM: Magnesium: 2.1 mg/dL

## 2013-08-13 LAB — APTT: Activated PTT: 87.1 s — ABNORMAL HIGH

## 2013-08-13 LAB — BRONCHIAL WASH CULTURE

## 2013-08-13 LAB — VANCOMYCIN, TROUGH: VANCOMYCIN, TROUGH: 8 ug/mL — AB (ref 10–20)

## 2013-08-14 LAB — CBC WITH DIFFERENTIAL/PLATELET
BASOS ABS: 0.1 10*3/uL (ref 0.0–0.1)
Basophil %: 0.8 %
Eosinophil #: 0.9 10*3/uL — ABNORMAL HIGH (ref 0.0–0.7)
Eosinophil %: 5.9 %
HCT: 31.7 % — AB (ref 40.0–52.0)
HGB: 10.5 g/dL — ABNORMAL LOW (ref 13.0–18.0)
LYMPHS PCT: 9.3 %
Lymphocyte #: 1.4 10*3/uL (ref 1.0–3.6)
MCH: 29.1 pg (ref 26.0–34.0)
MCHC: 33.1 g/dL (ref 32.0–36.0)
MCV: 88 fL (ref 80–100)
MONOS PCT: 9.2 %
Monocyte #: 1.4 x10 3/mm — ABNORMAL HIGH (ref 0.2–1.0)
NEUTROS ABS: 11.6 10*3/uL — AB (ref 1.4–6.5)
Neutrophil %: 74.8 %
Platelet: 291 10*3/uL (ref 150–440)
RBC: 3.61 10*6/uL — ABNORMAL LOW (ref 4.40–5.90)
RDW: 14 % (ref 11.5–14.5)
WBC: 15.5 10*3/uL — AB (ref 3.8–10.6)

## 2013-08-14 LAB — POTASSIUM: Potassium: 3.6 mmol/L (ref 3.5–5.1)

## 2013-08-14 LAB — CLOSTRIDIUM DIFFICILE(ARMC)

## 2013-08-14 LAB — PHOSPHORUS: PHOSPHORUS: 2.9 mg/dL (ref 2.5–4.9)

## 2013-08-14 LAB — URINE CULTURE

## 2013-08-14 LAB — CREATININE, SERUM
CREATININE: 0.95 mg/dL (ref 0.60–1.30)
EGFR (Non-African Amer.): >60

## 2013-08-14 LAB — APTT: ACTIVATED PTT: 88 s — AB (ref 23.6–35.9)

## 2013-08-14 LAB — MAGNESIUM: Magnesium: 2.3 mg/dL

## 2013-08-14 LAB — VANCOMYCIN, TROUGH: Vancomycin, Trough: 13 ug/mL (ref 10–20)

## 2013-08-15 LAB — BASIC METABOLIC PANEL
Anion Gap: 3 — ABNORMAL LOW (ref 7–16)
BUN: 22 mg/dL — ABNORMAL HIGH (ref 7–18)
Calcium, Total: 8.3 mg/dL — ABNORMAL LOW (ref 8.5–10.1)
Chloride: 101 mmol/L (ref 98–107)
Co2: 35 mmol/L — ABNORMAL HIGH (ref 21–32)
Creatinine: 1.03 mg/dL (ref 0.60–1.30)
EGFR (African American): >60
Glucose: 126 mg/dL — ABNORMAL HIGH (ref 65–99)
Osmolality: 282 (ref 275–301)
POTASSIUM: 3.2 mmol/L — AB (ref 3.5–5.1)
Sodium: 139 mmol/L (ref 136–145)

## 2013-08-15 LAB — VANCOMYCIN, TROUGH: Vancomycin, Trough: 18 ug/mL (ref 10–20)

## 2013-08-15 LAB — CBC WITH DIFFERENTIAL/PLATELET
Basophil #: 0.1 10*3/uL (ref 0.0–0.1)
Basophil %: 0.5 %
EOS ABS: 1.1 10*3/uL — AB (ref 0.0–0.7)
Eosinophil %: 8.6 %
HCT: 31.8 % — ABNORMAL LOW (ref 40.0–52.0)
HGB: 10.5 g/dL — ABNORMAL LOW (ref 13.0–18.0)
LYMPHS PCT: 16.6 %
Lymphocyte #: 2.1 10*3/uL (ref 1.0–3.6)
MCH: 28.7 pg (ref 26.0–34.0)
MCHC: 32.9 g/dL (ref 32.0–36.0)
MCV: 87 fL (ref 80–100)
MONO ABS: 0.9 x10 3/mm (ref 0.2–1.0)
MONOS PCT: 6.7 %
NEUTROS PCT: 67.6 %
Neutrophil #: 8.6 10*3/uL — ABNORMAL HIGH (ref 1.4–6.5)
Platelet: 294 10*3/uL (ref 150–440)
RBC: 3.65 10*6/uL — ABNORMAL LOW (ref 4.40–5.90)
RDW: 14 % (ref 11.5–14.5)
WBC: 12.8 10*3/uL — ABNORMAL HIGH (ref 3.8–10.6)

## 2013-08-15 LAB — APTT
ACTIVATED PTT: 28.8 s (ref 23.6–35.9)
Activated PTT: 122.7 secs — ABNORMAL HIGH (ref 23.6–35.9)

## 2013-08-15 LAB — MAGNESIUM: MAGNESIUM: 2.2 mg/dL

## 2013-08-15 LAB — LIPASE, BLOOD: Lipase: 349 U/L (ref 73–393)

## 2013-08-15 LAB — POTASSIUM: POTASSIUM: 3.8 mmol/L (ref 3.5–5.1)

## 2013-08-15 LAB — PHOSPHORUS: Phosphorus: 3.7 mg/dL (ref 2.5–4.9)

## 2013-08-16 LAB — CBC WITH DIFFERENTIAL/PLATELET
BASOS PCT: 0.4 %
Basophil #: 0.1 10*3/uL (ref 0.0–0.1)
EOS ABS: 0.4 10*3/uL (ref 0.0–0.7)
Eosinophil %: 2.4 %
HCT: 32 % — AB (ref 40.0–52.0)
HGB: 10.5 g/dL — ABNORMAL LOW (ref 13.0–18.0)
Lymphocyte #: 1.1 10*3/uL (ref 1.0–3.6)
Lymphocyte %: 7 %
MCH: 28.9 pg (ref 26.0–34.0)
MCHC: 32.9 g/dL (ref 32.0–36.0)
MCV: 88 fL (ref 80–100)
Monocyte #: 0.7 x10 3/mm (ref 0.2–1.0)
Monocyte %: 4.9 %
NEUTROS ABS: 12.9 10*3/uL — AB (ref 1.4–6.5)
Neutrophil %: 85.3 %
PLATELETS: 346 10*3/uL (ref 150–440)
RBC: 3.65 10*6/uL — AB (ref 4.40–5.90)
RDW: 14.1 % (ref 11.5–14.5)
WBC: 15.1 10*3/uL — AB (ref 3.8–10.6)

## 2013-08-16 LAB — BASIC METABOLIC PANEL
Anion Gap: 4 — ABNORMAL LOW (ref 7–16)
BUN: 25 mg/dL — ABNORMAL HIGH (ref 7–18)
Calcium, Total: 8.6 mg/dL (ref 8.5–10.1)
Chloride: 103 mmol/L (ref 98–107)
Co2: 35 mmol/L — ABNORMAL HIGH (ref 21–32)
Creatinine: 1.06 mg/dL (ref 0.60–1.30)
EGFR (African American): >60
GLUCOSE: 123 mg/dL — AB (ref 65–99)
OSMOLALITY: 289 (ref 275–301)
Potassium: 3.4 mmol/L — ABNORMAL LOW (ref 3.5–5.1)
Sodium: 142 mmol/L (ref 136–145)

## 2013-08-16 LAB — CULTURE, BLOOD (SINGLE)

## 2013-08-16 LAB — POTASSIUM: POTASSIUM: 3.2 mmol/L — AB (ref 3.5–5.1)

## 2013-08-16 LAB — MAGNESIUM: Magnesium: 1.9 mg/dL

## 2013-08-16 LAB — PHOSPHORUS: Phosphorus: 3.4 mg/dL (ref 2.5–4.9)

## 2013-08-17 LAB — CBC WITH DIFFERENTIAL/PLATELET
BASOS ABS: 0.1 10*3/uL (ref 0.0–0.1)
Basophil %: 0.5 %
Eosinophil #: 0.2 10*3/uL (ref 0.0–0.7)
Eosinophil %: 1.1 %
HCT: 33.5 % — ABNORMAL LOW (ref 40.0–52.0)
HGB: 11.1 g/dL — AB (ref 13.0–18.0)
LYMPHS PCT: 6.3 %
Lymphocyte #: 1 10*3/uL (ref 1.0–3.6)
MCH: 29 pg (ref 26.0–34.0)
MCHC: 33.2 g/dL (ref 32.0–36.0)
MCV: 88 fL (ref 80–100)
Monocyte #: 0.8 x10 3/mm (ref 0.2–1.0)
Monocyte %: 5.2 %
Neutrophil #: 13.2 10*3/uL — ABNORMAL HIGH (ref 1.4–6.5)
Neutrophil %: 86.9 %
Platelet: 363 10*3/uL (ref 150–440)
RBC: 3.83 10*6/uL — AB (ref 4.40–5.90)
RDW: 14.5 % (ref 11.5–14.5)
WBC: 15.1 10*3/uL — ABNORMAL HIGH (ref 3.8–10.6)

## 2013-08-17 LAB — BASIC METABOLIC PANEL
ANION GAP: 4 — AB (ref 7–16)
BUN: 21 mg/dL — ABNORMAL HIGH (ref 7–18)
CALCIUM: 8.6 mg/dL (ref 8.5–10.1)
CO2: 35 mmol/L — AB (ref 21–32)
Chloride: 103 mmol/L (ref 98–107)
Creatinine: 0.9 mg/dL (ref 0.60–1.30)
EGFR (African American): >60
EGFR (Non-African Amer.): >60
GLUCOSE: 152 mg/dL — AB (ref 65–99)
Osmolality: 289 (ref 275–301)
Potassium: 3.7 mmol/L (ref 3.5–5.1)
SODIUM: 142 mmol/L (ref 136–145)

## 2013-08-17 LAB — POTASSIUM: Potassium: 3.1 mmol/L — ABNORMAL LOW (ref 3.5–5.1)

## 2013-08-17 LAB — MAGNESIUM: Magnesium: 1.9 mg/dL

## 2013-08-18 LAB — CBC WITH DIFFERENTIAL/PLATELET
BASOS ABS: 0.1 10*3/uL (ref 0.0–0.1)
Basophil %: 0.6 %
Eosinophil #: 0.5 10*3/uL (ref 0.0–0.7)
Eosinophil %: 4.7 %
HCT: 34.5 % — AB (ref 40.0–52.0)
HGB: 11.5 g/dL — AB (ref 13.0–18.0)
Lymphocyte #: 1.3 10*3/uL (ref 1.0–3.6)
Lymphocyte %: 12.7 %
MCH: 28.9 pg (ref 26.0–34.0)
MCHC: 33.2 g/dL (ref 32.0–36.0)
MCV: 87 fL (ref 80–100)
MONO ABS: 0.8 x10 3/mm (ref 0.2–1.0)
Monocyte %: 7.4 %
NEUTROS ABS: 7.7 10*3/uL — AB (ref 1.4–6.5)
NEUTROS PCT: 74.6 %
Platelet: 372 10*3/uL (ref 150–440)
RBC: 3.96 10*6/uL — ABNORMAL LOW (ref 4.40–5.90)
RDW: 13.9 % (ref 11.5–14.5)
WBC: 10.3 10*3/uL (ref 3.8–10.6)

## 2013-08-18 LAB — BASIC METABOLIC PANEL
Anion Gap: 4 — ABNORMAL LOW (ref 7–16)
BUN: 20 mg/dL — AB (ref 7–18)
Calcium, Total: 8.5 mg/dL (ref 8.5–10.1)
Chloride: 102 mmol/L (ref 98–107)
Co2: 35 mmol/L — ABNORMAL HIGH (ref 21–32)
Creatinine: 0.87 mg/dL (ref 0.60–1.30)
EGFR (African American): >60
EGFR (Non-African Amer.): >60
Glucose: 134 mg/dL — ABNORMAL HIGH (ref 65–99)
Osmolality: 286 (ref 275–301)
Potassium: 3.4 mmol/L — ABNORMAL LOW (ref 3.5–5.1)
Sodium: 141 mmol/L (ref 136–145)

## 2013-08-18 LAB — MAGNESIUM: MAGNESIUM: 1.8 mg/dL

## 2013-08-18 LAB — PHOSPHORUS: Phosphorus: 2.6 mg/dL (ref 2.5–4.9)

## 2013-08-19 LAB — BASIC METABOLIC PANEL
Anion Gap: 6 — ABNORMAL LOW (ref 7–16)
BUN: 18 mg/dL (ref 7–18)
CREATININE: 0.93 mg/dL (ref 0.60–1.30)
Calcium, Total: 9.1 mg/dL (ref 8.5–10.1)
Chloride: 105 mmol/L (ref 98–107)
Co2: 29 mmol/L (ref 21–32)
EGFR (Non-African Amer.): >60
Glucose: 173 mg/dL — ABNORMAL HIGH (ref 65–99)
Osmolality: 285 (ref 275–301)
Potassium: 4 mmol/L (ref 3.5–5.1)
Sodium: 140 mmol/L (ref 136–145)

## 2013-08-19 LAB — CBC WITH DIFFERENTIAL/PLATELET
BASOS ABS: 0.1 10*3/uL (ref 0.0–0.1)
Basophil %: 1 %
Eosinophil #: 0.5 10*3/uL (ref 0.0–0.7)
Eosinophil %: 4 %
HCT: 36.2 % — AB (ref 40.0–52.0)
HGB: 11.9 g/dL — ABNORMAL LOW (ref 13.0–18.0)
LYMPHS PCT: 15.3 %
Lymphocyte #: 1.8 10*3/uL (ref 1.0–3.6)
MCH: 29 pg (ref 26.0–34.0)
MCHC: 33 g/dL (ref 32.0–36.0)
MCV: 88 fL (ref 80–100)
Monocyte #: 0.7 x10 3/mm (ref 0.2–1.0)
Monocyte %: 6.4 %
Neutrophil #: 8.4 10*3/uL — ABNORMAL HIGH (ref 1.4–6.5)
Neutrophil %: 73.3 %
PLATELETS: 414 10*3/uL (ref 150–440)
RBC: 4.12 10*6/uL — AB (ref 4.40–5.90)
RDW: 14.3 % (ref 11.5–14.5)
WBC: 11.5 10*3/uL — ABNORMAL HIGH (ref 3.8–10.6)

## 2013-08-19 LAB — PHOSPHORUS: PHOSPHORUS: 3 mg/dL (ref 2.5–4.9)

## 2013-08-19 LAB — MAGNESIUM: Magnesium: 1.8 mg/dL

## 2013-08-22 LAB — CULTURE, BLOOD (SINGLE)

## 2013-09-22 ENCOUNTER — Emergency Department: Payer: Self-pay | Admitting: Emergency Medicine

## 2013-09-22 LAB — TROPONIN I

## 2013-09-22 LAB — CBC
HCT: 36.8 % — ABNORMAL LOW (ref 40.0–52.0)
HGB: 12.2 g/dL — AB (ref 13.0–18.0)
MCH: 28.3 pg (ref 26.0–34.0)
MCHC: 33.1 g/dL (ref 32.0–36.0)
MCV: 85 fL (ref 80–100)
Platelet: 361 10*3/uL (ref 150–440)
RBC: 4.31 10*6/uL — ABNORMAL LOW (ref 4.40–5.90)
RDW: 13.8 % (ref 11.5–14.5)
WBC: 14.7 10*3/uL — ABNORMAL HIGH (ref 3.8–10.6)

## 2013-09-22 LAB — URINALYSIS, COMPLETE
BACTERIA: NONE SEEN
Bilirubin,UR: NEGATIVE
Glucose,UR: NEGATIVE mg/dL (ref 0–75)
Ketone: NEGATIVE
LEUKOCYTE ESTERASE: NEGATIVE
NITRITE: NEGATIVE
PH: 5 (ref 4.5–8.0)
Protein: NEGATIVE
RBC,UR: 6 /HPF (ref 0–5)
Specific Gravity: >1.06 (ref 1.003–1.030)

## 2013-09-22 LAB — BASIC METABOLIC PANEL
ANION GAP: 9 (ref 7–16)
BUN: 12 mg/dL (ref 7–18)
Calcium, Total: 8.7 mg/dL (ref 8.5–10.1)
Chloride: 104 mmol/L (ref 98–107)
Co2: 23 mmol/L (ref 21–32)
Creatinine: 0.77 mg/dL (ref 0.60–1.30)
EGFR (African American): >60
GLUCOSE: 114 mg/dL — AB (ref 65–99)
OSMOLALITY: 273 (ref 275–301)
POTASSIUM: 3.7 mmol/L (ref 3.5–5.1)
SODIUM: 136 mmol/L (ref 136–145)

## 2013-09-22 LAB — TSH: THYROID STIMULATING HORM: 0.426 u[IU]/mL — AB

## 2013-09-22 LAB — T4, FREE: FREE THYROXINE: 1.59 ng/dL — AB (ref 0.76–1.46)

## 2014-09-08 NOTE — Consult Note (Signed)
CC: coughing up blood from nose.  Pt denies vomiting blood, nurse thinks it was on the canula.  Humidified air now.  No EGD and will give clear liquids tonight and full liquids in morning.   Continue Plavix and asprin as needed.  Electronic Signatures: Scot JunElliott, Shakur Lembo T (MD)  (Signed on 17-Feb-14 18:38)  Authored  Last Updated: 17-Feb-14 18:38 by Scot JunElliott, Enolia Koepke T (MD)

## 2014-09-08 NOTE — Consult Note (Signed)
CC bleeding,  no evidence of GI bleed, no abd pain, no nausea, no vomiting.  Will advance diet and I will sign off, reconsult if needed.  Electronic Signatures: Scot JunElliott, Robert T (MD)  (Signed on 18-Feb-14 08:12)  Authored  Last Updated: 18-Feb-14 08:12 by Scot JunElliott, Robert T (MD)

## 2014-09-08 NOTE — Consult Note (Signed)
PATIENT NAME:  Craig Reese, Mayer MR#:  [id number] DATE OF BIRTH:  [birth year]  DATE OF CONSULTATION:  07/04/2012  REFERRING PHYSICIAN:      Ramonita LabAruna Gouru, MD CONSULTING PHYSICIAN:  Laurier NancyShaukat A. Markitta Ausburn, MD  REASON FOR CONSULTATION:  Cardiology evaluation.  HISTORY OF PRESENT ILLNESS:  This is a 54 year old Hispanic male who does not speak English who came into the hospital with severe episode of shortness of breath, PND, orthopnea and chest tightness. His blood pressure was 260/120 when he came in and he was given Lasix 40 mg IV and follow up blood pressures revealed 147/73. He felt like he was dying, he had a heaviness and like an elephant sitting on his chest when he first came in.  His BNP was 1026 and initial troponin was 0.05 and has steadily gone up.    PAST MEDICAL HISTORY:  He states that he has been short of breath for a couple of weeks and he was given tetracycline by [place] doctors in [place] which he has been taking, but without any improvement. He denies history of hypertension, diabetes, hypercholesterolemia, coronary artery disease or heart failure.  His past medical history is insignificant otherwise.  ALLERGIES: None.   SOCIAL HISTORY: He denies smoking but he drinks 12 beers on weekends and family says he drinks too much during the weekends.   FAMILY HISTORY: His father and mother are deceased. Etiology is unknown.   PHYSICAL EXAMINATION: GENERAL: He is alert, oriented x 3, in no acute distress. VITAL SIGNS:  His blood pressure right now is 144/90, respirations 18, temperature 98.9, pulse 97, pulse oximetry 96.  NECK: Positive jugular venous distention.  LUNGS: Clear.  HEART: Regular rate and rhythm. Normal S1, S2. No audible murmur.  ABDOMEN: Soft, nontender, positive bowel sounds.  EXTREMITIES: No pedal edema.  NEUROLOGIC: Appears to be intact.   LABORATORY AND DIAGNOSTIC DATA:  EKG shows sinus tachycardia, 112 beats per minute, LVH, nonspecific ST-T changes. His initial  troponin was 0.05. The second troponin came back 0.36, CPK total was 298, MB fraction 4.2. His LDL was 87, total cholesterol 126, triglycerides 75 and HDL 24.  His BUN is 15 and creatinine is 1.12, potassium 3.2.   ASSESSMENT AND PLAN: The patient is in congestive heart failure. According to chest x-ray had bilateral pulmonary congestion.  Also the troponins are trending up.  His symptoms are suggestive of coronary artery disease.   PLAN: Do left heart catheterization in a.m.   Thank you very much for the referral.    ____________________________ Laurier NancyShaukat A. Jozalyn Baglio, MD sak:ct D: 07/04/2012 09:53:26 ET T: 07/04/2012 12:28:36 ET JOB#: [id number]  cc: Laurier NancyShaukat A. Jsaon Yoo, MD, <Dictator> Laurier NancySHAUKAT A Vinh Sachs MD ELECTRONICALLY SIGNED 08/10/2012 9:04

## 2014-09-08 NOTE — Discharge Summary (Signed)
PATIENT NAME:  Craig Reese, Craig Reese MR#:  [id number] DATE OF BIRTH:  1961/04/23  DATE OF ADMISSION:  07/04/2012 DATE OF DISCHARGE:  07/06/2012  DISPOSITION:  Home.   ADMISSION DIAGNOSIS:  Shortness of breath.   DISCHARGE DIAGNOSES:   1.  Non-ST elevation myocardial infarction. 2.  Acute coronary syndrome. 3.  Congestive heart failure with flash pulmonary edema. 4.  Diastolic dysfunction, ejection fraction 50%. 5.  Underlying pneumonia. 6.  New diagnosis hypertension. 7.  Alcohol dependency.   IMPORTANT RESULTS:  Cardiac catheterization:  Ventricular function was mildly depressed, with ejection fraction calculated at 45%. Transthoracic EKG actually showed ejection fraction was much better than that.   First lesion intervention with a stent performed on a 70% lesion in the proximal LAD. Second lesion, percutaneous intervention was performed on the lesion in the proximal circumflex. Summary: Two-vessel disease with mild left ventricular dysfunction. PCI successful. Stent to proximal LAD.   LAD: 70% stenosis, mid-LAD 75% stenosis,   Proximal circumflex: 70% stenosis.   Proximal RCA: 40% stenosis.   EKG has depression of ST segments.   Potassium was decreased at 3.2, 2.8, and at discharge 4.5. Glucose was 106, 109. Creatinine was 0.99. Magnesium was 1.4, replaced; 2.3 at discharge.    LDL was 87, triglycerides were 75. HDL was decreased at 24. Hemoglobin A1c 6.2.   LFTs were within normal limits. Troponin up to 1.7.   White count was 18.9 on admission and at discharge 12. Hemoglobin was 12 on admission. Blood cultures were negative.   DISPOSITION: Home.   MEDICATIONS ON DISCHARGE: Nitroglycerin 0.4 sublingual, aspirin 81 mg once a day, Plavix 75 mg once a day, metoprolol 25 mg take 1/2 tablet twice daily, levofloxacin 750 mg once a day, Protonix 40 mg once a day, furosemide 20 mg once a day, potassium chloride 10 mEq once a day, lovastatin 20 mg extended-release once a day.   The patient  was not discharged on an ACE inhibitor at this moment. Will plan to initiate later on with Dr. Welton FlakesKhan.   HOSPITAL COURSE: Mr. Craig JamaicaVasquez is a very nice 54 year old gentleman from [place] who has history of being overall healthy or at least not having any medical complaints. He does not see a doctor. He has not been to a doctor in a very long time. He works really hard and his diet is not good.  He drinks a good amount of beers over the weekend, and he presented with a history of a cough for the past week, dry in nature, but it became productive, started bringing yellowish sputum.   He was given tetracycline from a friend who got it from [place], and that did not really make an improvement. For that reason, he came to the ER. He became very short of breath 1 or 2 hours prior to admission, and he felt like he was about to die.   The patient felt like he was drowning inside his chest. His blood pressure was 260/120, his white count was elevated at 18,000, as mentioned above, his BNP was elevated at 1026.   His initial troponin was 0.05. He received Lasix and was put on levofloxacin. He was admitted to the floor and he was doing okay. The day he was going for his echocardiogram, he started developing significant shortness of breath. The patient went down to the echocardiogram, but was rushed back to his room.   At that moment, the patient was severely diaphoretic and started having some chest pain. EKG was done right away  showing changes of ischemia with depression of ST segments and T-wave inversions in lateral leads and septal leads.   The patient was transferred to the CCU. He was put on IV beta blocker, nitroglycerin, aspirin, Plavix, and Integrilin drip was given. The patient was stable during that day, and the following day, he underwent a cardiac catheterization.  His cardiac catheterization, as indicated, showed blockages of the LAD in proximal and distal, for which he had a PCA and a stent placement  at the LAD.  The patient was discharged home on a beta blocker, aspirin, Plavix. Since all of these were new medications for him, we held on an ACE inhibitor, as his blood pressures were low, 100/50, so he needs an ACE inhibitor to be started on soon as his ejection fraction was slightly decreased at 40%.   The patient is discharged in good condition. Dr. Adrian Blackwater is going to see him in the office. They will provide prescriptions and continuation of his medications.   TIME SPENT: I spent about 40 minutes with discharge of this patient the day of discharge.    ____________________________ Felipa Furnace, MD rsg:dm D: 07/10/2012 22:22:00 ET T: 07/11/2012 07:34:00 ET JOB#: [id number]  cc: Felipa Furnace, MD, <Dictator> Laurier Nancy, MD    Regan Rakers Juanda Chance MD ELECTRONICALLY SIGNED 08/03/2012 12:49

## 2014-09-08 NOTE — Consult Note (Signed)
PATIENT NAME:  Craig Reese, Craig Reese MR#:  [id number] DATE OF BIRTH:  November 15, 1960  DATE OF CONSULTATION:  10/21/2012  REFERRING PHYSICIAN:   CONSULTING PHYSICIAN:  Laurier NancyShaukat A. Kristena Wilhelmi, MD  HISTORY OF PRESENT ILLNESS: This is a 54 year old, Hispanic male, who does not speak [place], who came into the hospital with diaphoresis and chest pain, which started this morning, associated with shortness of breath and diaphoresis.   PAST MEDICAL HISTORY: He has a past medical history of PCI and stenting of the LAD. He has other past medical history of hypertension and hypercholesterolemia.   SOCIAL HISTORY:  Unremarkable.  PHYSICAL EXAMINATION:  GENERAL: He is alert and oriented x 3. He is diaphoretic. VITAL SIGNS: Stable. HEENT: Revealed no JVD. LUNGS: Clear. HEART: Regular, rate and rhythm. Normal S1, S2. No audible murmur. ABDOMEN: Soft and nontender. Positive bowel sounds. EXTREMITIES: No pedal edema. NEUROLOGIC: The patient appears to be intact.  EKG shows normal sinus rhythm at 88 beats per minute, LVH and non-specific ST-T changes. First Troponin is 0.34. BUN and creatinine is 14/1.24.  ASSESSMENT:  Non-ST elevation myocardial infarction.  PLAN: To do left heart catheterization.   ____________________________ Laurier NancyShaukat A. Sarena Jezek, MD sak:aw D: 10/21/2012 09:07:30 ET T: 10/21/2012 09:24:14 ET JOB#: [id number]  cc: Laurier NancyShaukat A. Nelsy Madonna, MD, <Dictator> Laurier NancySHAUKAT A Silvester Reierson MD ELECTRONICALLY SIGNED 10/28/2012 14:27

## 2014-09-08 NOTE — Consult Note (Signed)
PATIENT NAME:  Craig Reese, Craig Reese MR#:  [id number] DATE OF BIRTH:  1961/02/14  DATE OF CONSULTATION:  03/25/2013  REFERRING PHYSICIAN:   CONSULTING PHYSICIAN:  Laurier NancyShaukat A. Thierno Hun, MD  INDICATION FOR CONSULTATION:  Chest pain and palpitations and shortness of breath.  HISTORY OF PRESENT ILLNESS:  This is a 54 year old Hispanic male with a history of chronic atrial fibrillation, coronary artery disease status post PCI and stenting, hypertension, CHF, hyperlipidemia, who came into the hospital with chest pain. He was given to 20 mg of IV Cardizem and was started on a drip. It controlled his a-fib, ventricular rate. He started feeling better after that. He denies any chest pain or shortness of breath right now.   PAST MEDICAL HISTORY:  A history of paroxysmal atrial fibrillation, noncompliance with medications.   MEDICATIONS:  He was started on amiodarone tapering dose. Right now he is on 400 mg a day, Cardizem CD 180, Plavix 75 mg p.o. daily, Coumadin 5 mg tablets Monday, Wednesday, Friday and 5 mg 1 tablet the rest of the days, furosemide 40 mg, lisinopril 20 mg, metoprolol tartrate 25 b.i.d., lisinopril 20 mg, Protonix 40 mg once a day, simvastatin 20 mg a 1/2 tablet a day.   SOCIAL HISTORY:  Denies EtOH abuse or smoking.   ALLERGIES:  None.   FAMILY HISTORY:  Positive for coronary artery disease.   PHYSICAL EXAMINATION:  GENERAL:   Alert, oriented x 3, in no acute distress.  VITAL SIGNS:  Heart rate is 85 and the monitor shows atrial fibrillation. Blood pressure is 118/57, respirations 18, temperature 98.7.  HEENT:  No JVD.  LUNGS:  Clear.  HEART:  Irregularly irregular. Normal S1, S2. No audible murmur.  ABDOMEN:  Soft, nontender, positive bowel sounds.  EXTREMITIES:  No pedal edema.   DIAGNOSTIC DATA:  EKG done this morning shows atrial fibrillation, 134 beats per minute with rapid ventricular response rates, LVH, nonspecific ST-T changes.   ASSESSMENT AND PLAN:  The patient has paroxysmal  atrial fibrillation because of noncompliance. Agree with starting metoprolol, Cardizem, along with amiodarone for possible chemical cardioversion, continue Coumadin. He is noncompliant with his medication and that may be the reason why he keeps going back into atrial fibrillation. He was in atrial fibrillation on the last admission and converted to sinus rhythm by starting beta blockers. Right now his INR is 1.4 suggestive that he is not taking his Coumadin. He needs to see social worker to advise him getting these medications because of language problem. He may not be taking his medicines either every day or not taking it properly. This is the second time he is being admitted with noncompliance of medication. We will advise transferring the patient to telemetry and continue these rate control for atrial fibrillation and give 10 mg of Coumadin today to get the INR therapeutic.  Thank you very much for the referral.   ____________________________ Laurier NancyShaukat A. Zyra Parrillo, MD sak:jm D: 03/25/2013 17:35:53 ET T: 03/25/2013 17:56:24 ET JOB#: [id number]  cc: Laurier NancyShaukat A. Kason Benak, MD, <Dictator> Laurier NancySHAUKAT A Lavonna Lampron MD ELECTRONICALLY SIGNED 03/31/2013 9:02

## 2014-09-08 NOTE — H&P (Signed)
PATIENT NAME:  Craig Reese, Craig Reese MR#:  [name] DATE OF BIRTH:  1960/12/05  DATE OF ADMISSION:  03/25/2013  PRIMARY DOCTOR:  Nonlocal.  CARDIOLOGIST:  Dr. Adrian BlackwaterShaukat Khan.   CHIEF COMPLAINT:  Chest pain and shortness of breath.   HISTORY OF PRESENT ILLNESS:  The patient is a 54 year old male patient with a history of chronic atrial fibrillation, coronary artery disease with stent placement, hypertension, CHF, hyperlipidemia, came in because of chest pain and shortness of breath. The patient's heart rate was in the 150s. When he came with rapid a-fib, the patient received Cardizem 20 mg IV push and Cardizem drip being started. The patient's heart rate right now is around 130. Started to have chest pain this morning around 6:30 a.m., left-sided chest pain, not radiating to the arms or the back, also associated with shortness of breath. The patient also has some cough, but no dizziness. No nausea. No vomiting. No relieving factors and no aggravating factors. Shortness of breath since this morning, has some pedal edema.   PAST MEDICAL HISTORY:  Significant for chronic atrial fibrillation, a history of left atrial smoke and has coronary artery disease, hypertension, chronic CHF, and hyperlipidemia, and the patient was admitted in October on October 10th and discharged on October 12th.   The patient's MEDICATIONS:  1.  Amiodarone 400 mg 1 tablet p.o. daily.  2.  Cardizem CD 180 mg po  daily.  3.  Plavix 75 mg p.o. daily.  4.  Coumadin 5 mg 2 tablets Monday, Wednesday, Friday and 5 mg 1 tablet the rest of the days. 5.  Furosemide 40 mg p.o. daily.  6.  Imodium 230 mg p.o. daily.  7.  KCL 20 mEq p.o. daily.  8.  Lisinopril 20 mg daily.  9.  Metoprolol tartrate 25 mg p.o. b.i.d.  10.  Lisinopril 20 mg daily.  11.  Protonix 40 mg p.o. daily.  12.  Simvastatin 20 mg 1/2 tablet daily.   SOCIAL HISTORY:  No smoking, no drinking, no drugs.   PAST SURGICAL HISTORY:  Significant for coronary artery disease  and stent placement.   ALLERGIES:  None.   FAMILY HISTORY:  Cardiac history in the family.  REVIEW OF SYSTEMS:  CONSTITUTIONAL:  No fever. No chills. No fatigue. No weight loss.  EYES:  No blurred vision, no glaucoma.  ENT:  No tinnitus. No ear pain. No hearing loss. No epistaxis.  RESPIRATORY:  Shortness of breath today and also mild cough, but no wheezing.  CARDIOVASCULAR:  Has chest pain and palpitations today. No syncope.  GASTROINTESTINAL:  No nausea. No vomiting. No diarrhea.  GENITOURINARY:  No dysuria.  HEMATOLOGICALLY:  No anemia or easy bruising.  SKIN:  No skin rashes.  MUSCULOSKELETAL:  No joint pain.  NEUROLOGIC:  No CVAs, no TIAs. No seizures.  PSYCHIATRIC:  No anxiety or insomnia.   PHYSICAL EXAMINATION:  VITAL SIGNS:  Temperature 98.3, heart rate 155, initially blood pressure 137/92, sats 98% on room air. During my visit, the heart rate was around 130s. The patient says he feels tired.  GENERAL:  Well-developed, well-nourished, Spanish male not in distress.  HEENT:  Head normocephalic, atraumatic.  EYES:  Pupils equally reacting to light and no conjunctival pallor. No scleral icterus.  NOSE: turbinate hypertrophy  No drainage.  EARS:  No tympanic membrane congestion.  MOUTH:  No lesions. No exudates.  NECK:  Supple. No JVD. No carotid bruit. Thyroid is in the midline. Normal range of motion.  LUNGS:  Clear to auscultation. No  wheeze. No rales.  CARDIOVASCULAR:  S1, S2 regular. Tachycardic. Does have 1+ pitting edema up to ankles.   GASTROINTESTINAL:  Abdomen is soft, nontender, nondistended. Bowel sounds present. No organomegaly.  MUSCULOSKELETAL:  The patient is able to move all extremities. No tenderness or effusion.  SKIN:  Inspection is within normal limits.  VASCULAR:  Good pedal pulses.  NEUROLOGIC:  Cranial nerves II through XII intact. Power 5/5 in upper and lower extremities. Senses are intact. DTRs 2+ bilaterally. PSYCHIATRIC:  Judgment and insight are  adequate.   LABORATORY AND RADIOLOGICAL DATA:  Sodium 139, potassium 3.1, chloride 102, bicarb 29, BUN 10, creatinine 0.96. Glucose 131. CK total is 165, CPK-MB 1.1. Chest x-ray shows suboptimal inspiration. No acute cardiopulmonary abnormalities. Troponin 0.04. INR is 1.4, WBC 14.3, hemoglobin 14.3, hematocrit 41.8, platelets 280. BNP is 2206. EKG shows atrial fibrillation with RVR, 134 beats per minute.   ASSESSMENT AND PLAN:  This is a 54 year old male patient with chronic atrial fibrillation, comes in with atrial fibrillation, rapid ventricular response.  1.  The patient is right now started on Cardizem drip and I already ordered a morning dose of Cardizem CD along with his amiodarone and see if patient can be weaned off the drip. Also Cardiology consult with Dr. Adrian Blackwater is placed.  2.  Anticoagulation for atrial fibrillation with Coumadin. INR is 1.4. The patient needs an extra dose of Coumadin today.  3.  Coronary artery disease with stent placement. Continue aspirin, beta blockers, statins, ACE inhibitors, and the patient has mild chest pain due to the atrial fibrillation with rapid ventricular response but his troponins are negative. Continue to cycle the troponins and use Imdur and also nitroglycerin patch as needed.  3.  A history of chronic systolic heart failure and the patient's ejection fraction was like 45% by June. Cardiac cath. He is already on ACE inhibitors and Lasix. Continue them.  4.  Mild hypokalemia, replace the potassium.  5.  The patient's condition is stable.   CODE STATUS:  FULL CODE.   TIME SPENT:  About 55 minutes. This is a critical care H and P.   ____________________________ Katha Hamming, MD sk:jm D: 03/25/2013 09:15:19 ET T: 03/25/2013 11:09:47 ET JOB#: [id number]  cc: Katha Hamming, MD, <Dictator> Katha Hamming MD ELECTRONICALLY SIGNED 04/18/2013 15:23

## 2014-09-08 NOTE — Consult Note (Signed)
52 YOHM came with chest pain since this am, appears unstabel angina. Plan cardiac cath.  Electronic Signatures: Radene KneeKhan, Evelynne Spiers Ali (MD)  (Signed on 05-Jun-14 09:05)  Authored  Last Updated: 05-Jun-14 09:05 by Radene KneeKhan, Daniell Paradise Ali (MD)

## 2014-09-08 NOTE — Discharge Summary (Signed)
PATIENT NAME:  Craig Reese, Craig Reese MR#:  [id number] DATE OF BIRTH:  09-Mar-1961  DATE OF ADMISSION:  02/25/2013 DATE OF DISCHARGE:  02/27/2013  REASON FOR ADMISSION: Palpitations and heart racing.   DISCHARGE DIAGNOSES:  1. Atrial fibrillation with rapid ventricular response, possible noncompliance with medications due to the patient not able to afford it.  2. Chest pain secondary to atrial fibrillation, with mild elevation of troponins due to demand ischemia.  3. Chronic anticoagulation.  4. History of noncompliance.  5. History of chronic congestive heart failure, diastolic, stable.  6. Hyperlipidemia.  7. Hypertension.  8. Coronary artery disease.   HOSPITAL COURSE: Craig Reese is a very nice 54 year old gentleman who I know from previous hospitalizations. The patient has a history of coronary artery disease, atrial fibrillation. He had a cardiac catheterization in June 2014 showing ejection fraction of 45% with moderate decrease in mid circumflex, mid LAD stent with first septal branch with 60% disease. The patient came with a previous history of left atrial smoke, meaning that there was a possibility of clot formation on his left atrial for which he was started on anticoagulation. He INR has not been therapeutic. The patient was [facility] due to similar complaints, and he was discharged on medications. Apparently, the patient has been off medications for several days due to the fact that he is not able to afford it, and he started having episode of palpitations, but all of a sudden, it hit him really hard, heart rate spiked up and started having chest discomfort, for which he came to the ER. In the ER, his heart rate was 127, atrial fibrillation with RVR and up to 150s. He received Cardizem, and then he was put on oral medications with Cardizem as well. The patient was monitored closely. His heart rate has slowed down to a safe basis in the 70s. I had a long conversation with the  patient as far as why he was not making his appointments. He states that he has been going to the clinic, and the last time he was told that he did not have to come back again, and they did not prescribe any medications. The patient apparently has not been showing up, and this is from records of the clinic, for which they have not been giving him any medication refills.   DISCHARGE MEDICATIONS: The patient is going to be discharged on his home medications that include:  1. Lisinopril 20 mg daily.  2. Isosorbide 30 mg daily.  3. Amiodarone 400 mg daily. 4. Coumadin 5 mg 2 tablets once a day.  5. Lovastatin 20 mg once a day. 6. Plavix 75 mg once a day.  7. Metoprolol 25 mg 2 times daily. 8. Cardizem 60 mg 3 times daily.  9. Furosemide 40 mg once a day. 10. Klor-Con 20 mEq once daily. 11. Omeprazole 20 mg.  FOLLOWUP: open [facility] next week. need INF checks periodically    After his rate was controlled, no other issues were identified. The patient was discharged in good condition.   As far as his other medical problems, they were all stable during this hospitalization. His congestive failure was compensated. His coronary artery disease has been stable. The patient is to continue his medications, Lasix, ACE inhibitor, calcium channel blocker.   TIME SPENT: I spent about 45 minutes with this patient at discharge.  ____________________________ Felipa Furnaceoberto Sanchez Gutierrez, MD rsg:lb D: 03/02/2013 07:18:57 ET T: 03/02/2013 08:15:50 ET JOB#: [id number]  cc: Felipa Furnaceoberto Sanchez Gutierrez, MD, <  Dictator> Bobie Caris Juanda Chance MD ELECTRONICALLY SIGNED 03/12/2013 22:53

## 2014-09-08 NOTE — Consult Note (Signed)
Brief Consult Note: Diagnosis: A fib with RVR.   Patient was seen by consultant.   Consult note dictated.   Orders entered.   Comments: Patient with CAD, h/o afib with SMOKE on previous echo, who has been out of meds for couple of days. He presented with palpitations, SOB, rapid HR and found to be in a fib with RVR, put on Diltiazem, still in afib with HR in 90's. Feels better today and denies CP, elevated TNI likely from demand ischemia. Will get echo to r/o thrombus and check wall motion, d/c Diltiazem gtt and change to Amio gtt. Will follow.  Electronic Signatures: Radene KneeKhan, Shaukat Ali (MD)   (Signed 12-Oct-14 14:28)  Co-Signer: Brief Consult Note Maia PlanManzi, Per Beagley A (PA-C)   (Signed 10-Oct-14 08:54)  Authored: Brief Consult Note  Last Updated: 12-Oct-14 14:28 by Radene KneeKhan, Shaukat Ali (MD)

## 2014-09-08 NOTE — Discharge Summary (Signed)
PATIENT NAME:  Craig Reese, Craig Reese MR#:  [id number] DATE OF BIRTH:  1960-09-13  DATE OF ADMISSION:  12/04/2012 DATE OF DISCHARGE:  12/12/2012  Refer to interim discharge dictation done by Dr. Auburn BilberryShreyang Patel which covers from July 19th through 25th.  I followed the patient from July 26th and 27th.  PATIENT'S DISCHARGE DIAGNOSES: 1.  Syncope, secondary to rapid ventricular response. Atrial fibrillation with rapid ventricular response status post cardioversion. The patient was found a lot of smoke  on the transesophageal echocardiogram. .  2.  Chest pain, secondary to gastroesophageal reflux disease.  3.  History of coronary artery disease.  4.  Hyperlipidemia.  5.  Chronic systolic heart failure.  6.  Hypertension. The patient has right groin cellulitis.   CONSULTATIONS: Cardiology consult: The patient's EF is around 45%.   Refer to interim discharge dictation done by Dr. Auburn BilberryShreyang Patel.   The patient is a 54 year old Spanish-speaking male admitted for recurrent episodes. Had a previous MI and stent placed in the LAD in February. The found to have a-fib with  RVR, and the patient was on Coumadin before, but he was admitted to hospitalist service, started on amiodarone drip along with heparin drip. The patient had a TEE which showed a lot of smoke BY ECHO,pt had cardioversion. The patient continued on heparin and  Coumadin. INR today is 2, so he will be going home with 10 mg of Coumadin. The patient was kept in the hospital because of the echo findings which showed a lot of smoke, so cardiologist recommended   INR to be 2. Today, the INR is 2, before discharge.so he can go home with Coumadin.  chest pain, so GI cocktail helped  him. The patient's chest pain, s improved with GI cocktail, so he will be going home with PPIs.  The patient has right groin pain and  worse with movement,.  so I started him on a heating pad and also Keflex. He said it is better today, so I am going to discharge him with Keflex  and suggest to continue heating pad. The patient can continue the aspirin and Plavix for his stent, and also he can continue metoprolol, aspirin 81 mg daily.   DISCHARGE MEDICATIONS: Include aspirin 81 mg daily, Coumadin 10 mg daily, Keppra 500 mg every 8 hours for 7 days, Imdur 30 mg p.o. daily, amiodarone 400 mg p.o. daily, furosemide 40 mg p.o. daily, potassium chloride 20 mEq p.o. daily, aspirin 81 mg daily, Plavix 75 mg p.o. daily, metoprolol 25 mg, one-half tablet p.o. b.i.d. and Skelaxin 800 mg p.o. t.i.d. for 5 days as needed for muscle spasms, omeprazole 40 mg p.o. b.i.d.  Follow up with primary cardiologist Dr. Adrian BlackwaterShaukat Khan in 1 week to 2 weeks.   Time spent on discharge preparation: More than 30 minutes.    ____________________________ Katha HammingSnehalatha Lyan Holck, MD sk:dm D: 12/12/2012 10:24:13 ET T: 12/12/2012 10:45:30 ET JOB#: [id number]  cc: Katha HammingSnehalatha Julyana Woolverton, MD, <Dictator> Laurier NancyShaukat A. Khan, MD Katha HammingSNEHALATHA Rosalie Gelpi MD ELECTRONICALLY SIGNED 12/13/2012 19:20

## 2014-09-08 NOTE — H&P (Signed)
PATIENT NAME:  Craig Reese, Craig Reese MR#:  [id number] DATE OF BIRTH:  08/24/1960  DATE OF ADMISSION:  12/04/2012  PRIMARY CARE PROVIDER: None.   PRIMARY CARDIOLOGIST: Laurier NancyShaukat A. Khan, MD  EMERGENCY DEPARTMENT REFERRING PHYSICIAN: Sheryl L. Mindi JunkerGottlieb, MD  CHIEF COMPLAINT: Syncope x 2.   HISTORY OF PRESENT ILLNESS: The patient is a 54 year old Spanish-speaking male who has had a history of having a non-ST MI in February 2014. At that time, he had a cardiac catheterization and had a stent placed in for a 75% stenosis, who was doing okay up until date of 06/05 when he got readmitted with cough, dyspnea and chest pain. At that time, he was diagnosed with acute systolic CHF. The patient stayed in the hospital for a few days and was subsequently discharged home. The patient reports that since being discharged, he has had 3 episodes of syncope with collapse. First episode happened on June 16. The second episode happened 2 weeks ago. He states that throughout this past few weeks, he has been having a feeling of pressure in his chest. Has had palpitations and has felt like his heart is beating very fast. He also has been short of breath, which he describes as coming and going, worse in the morning. He came to the ED because he passed out again and completely collapsed. Came to the ED when his heart rate was noted to be in the 140s with A. fib with RVR. The patient has no history of having atrial fibrillation. In the ED, he received Cardizem x 1. Heart rate continued to stay elevated. He was given nitroglycerin and his blood pressure dropped, so he had to receive a fluid bolus. The patient was started on IV Cardizem drip. He continues to feel unwell. A feeling of pressure-like sensation in his chest and continued palpitations. His heart rate has continued to be 130s. He otherwise denies any fevers, chills or cough. He denies any significant swelling in his lower extremity. Denies any abdominal pain or swelling. Denies any  nausea, vomiting or diarrhea.   PAST MEDICAL HISTORY:  1.  Significant for a history of non-ST MI with repeat cardiac catheterization done in 10/2012, which showed EF was 45%. There was moderate disease in the mid circumflex, mid LAD stent with jailed first septal branch which has 60% disease. His echo showed a 45% EF.  2.  History of malignant hypertension.  3.  Chronic CHF.  4.  Hyperlipidemia.   ALLERGIES: None.   CURRENT MEDICATIONS: Imdur 30 daily, nitroglycerin 0.4 sublingual p.r.n., lovastatin 20 daily, aspirin 81 one tab p.o. daily, Plavix 75 p.o. daily, Robitussin 5 mL q.6 p.r.n. cough, Lasix 40 daily, metoprolol 12.5 p.o. b.i.d., Klor-Con 20 mEq p.o. daily.   PAST SURGICAL HISTORY: None.   SOCIAL HISTORY: Denies any smoking. He used to drink alcohol in the past, but stopped several years ago.   FAMILY HISTORY: Both parents are deceased and of unknown cause. No cardiac history as per he can remember.   REVIEW OF SYSTEMS:    CONSTITUTIONAL: Denies any fevers. Complains of fatigue, weakness, chest pressure. No weight loss. No weight gains.  EYES: No blurred or double vision. No pain. No redness. No  inflammation. No glaucoma. No cataracts.  EARS, NOSE, THROAT: No tinnitus. No ear pain. No hearing loss. No epistaxis. No nasal discharge.  RESPIRATORY: Complains of shortness of breath. No cough. No wheezing. No hemoptysis. No COPD.  CARDIOVASCULAR: Complains of chest pressure, palpitations, syncope. Complains of dyspnea on exertion. Denies any orthopnea  or lower extremity swelling.  GASTROINTESTINAL: Complains of some nausea, but no vomiting or diarrhea. No abdominal pain. No hematemesis or melena.  GENITOURINARY: Denies any dysuria, hematuria, renal calculus or frequency.  ENDOCRINE: Denies any polyuria, nocturia or thyroid problems.  HEMATOLOGIC AND LYMPHATIC: Denies any major bruisability or bleeding.  SKIN: No acne. No rash. No changes in mole, hair or skin.  MUSCULOSKELETAL:  Denies any pain in the neck, back or shoulder.  NEUROLOGIC: No numbness. No CVA. No TIA. No seizures.  PSYCHIATRIC: No anxiety. No insomnia. No ADD.   PHYSICAL EXAMINATION: VITAL SIGNS: Temperature 99.1. Pulse currently is in the 130s, respirations 18, blood pressure 105/64.  GENERAL: The patient is a well-developed [place] male, is in distress secondary to his A. fib with RVR.  HEENT: Normocephalic, atraumatic. Pupils equally round, reactive to light and accommodation. Extraocular movements intact. Oropharynx is clear. There are no exudates. Mouth: There are no exudate.  NECK: Supple and symmetric. No masses. Thyroid midline.  RESPIRATORY: Good respiratory effort. Clear to auscultation. There are no rales, rhonchi or wheezing.  CARDIOVASCULAR: Irregularly irregular rhythm. No murmurs, rubs, clicks, gallops or heaves.  ABDOMEN: Soft, nontender, nondistended. Positive bowel sounds x 4. No hepatosplenomegaly.  GENITOURINARY: Deferred.  MUSCULOSKELETAL: There is no erythema or swelling.  SKIN: There is no rash.  LYMPHATICS: No cervical lymph nodes or any other lymphadenopathy palpable.  VASCULAR: Good DP, PT pulses.  NEUROLOGICAL: Cranial nerves II through XII grossly intact. DTRs are symmetric. Strength intact.  PSYCHIATRIC: Not anxious or depressed.   LABORATORY, DIAGNOSTIC AND RADIOLOGICAL DATA: His glucose is 164, BUN 14, creatinine 1.17, sodium 137, potassium 3.5, chloride 102, CO2 was 28, calcium 8.9. LFTs were normal. CPK 90, CK-MB 1.4, troponin less than 0.02. WBC 9.9, hemoglobin 14.4. INR 1.0.   EKG: A. fib with RVR with nonspecific ST-T wave changes.   CT scan of the head without contrast showed no acute intracranial processes. Chest x-ray shows no pulmonary infiltrates noted.   ASSESSMENT AND PLAN: The patient is a 54 year old Spanish speaking male with history of coronary artery disease, history of stent in February, readmitted in June with acute congestive heart failure, who  presents with 3 episodes of syncope with collapse, noted to be in atrial fibrillation with rapid ventricular response.  1.  Atrial fibrillation with rapid ventricular response: Currently on Cardizem drip. At this time due to his heart rate still being poorly controlled, he was hypotensive. We will go ahead and switch him to amiodarone drip. I have discussed the case with Dr. Adrian Blackwater, who agrees with the plan. The patient also has a CHADS score of 2 based on his congestive heart failure and hypertension. We will start him on a heparin drip. Risk and benefits of long-term anticoagulation will need to be weighed in light of patient being on aspirin, Plavix, and then now he will require chronic long-term anticoagulation. Will continue his low-dose metoprolol as taking at home. I will check a TSH. Since he had an echo done recently, we will not go ahead and repeat the echo for the time being. We will check serial cardiac enzymes. 2.  Coronary artery disease: Continue aspirin and Plavix. I will hold his Imdur in light of his blood pressure being low. We will use sublingual nitroglycerin as needed. 3.  Chronic systolic congestive heart failure: Appears compensated based on chest x-ray and exam. We will hold his Lasix for the time being.  4.  Hypertension: We will continue metoprolol.  5.  Hyperlipidemia: Continue  lovastatin as taking at home.  6.  Miscellaneous: We will use heparin for deep vein thrombosis prophylaxis.   Based on the patient's significant arrhythmia requiring intravenous amiodarone, the patient is critically ill and is at risk of cardiovascular collapse and complications. This is a critical care history and physical.   TIME SPENT: 45 minutes of critical care time spent.     ____________________________ Lacie Scotts. Allena Katz, MD shp:jm D: 12/04/2012 13:01:30 ET T: 12/04/2012 14:00:49 ET JOB#: [id number]  cc: Rendy Lazard H. Allena Katz, MD, <Dictator> Charise Carwin MD ELECTRONICALLY SIGNED  12/05/2012 13:12

## 2014-09-08 NOTE — Consult Note (Signed)
PATIENT NAME:  Craig Reese, Octavius MR#:  [id number] DATE OF BIRTH:  March 24, 1961  DATE OF CONSULTATION:  12/04/2012  REFERRING PHYSICIAN:   CONSULTING PHYSICIAN:  Laurier NancyShaukat A. Khan, MD  INDICATION FOR CONSULTATION: Syncope and atrial fibrillation.   HISTORY OF PRESENT ILLNESS: This is a 54 year old Hispanic male with a past medical history of non-STEMI in February 2014, came in with an episode where he passed out x 2. In the past, in February, he had a PCI and stenting of the LAD.   PAST MEDICAL HISTORY:  History of EXCEPT FOR of 45% and history of CHF, history of malignant hypertension.  ALLERGIES:  None.   MEDICATIONS:  Imdur, lovastatin, aspirin, Plavix and Robitussin cough syrup.    SOCIAL HISTORY:  Denies EtOH abuse or smoking.   FAMILY HISTORY:  Positive for coronary artery disease.   PHYSICAL EXAMINATION: GENERAL: He is alert, oriented x 3, in no acute distress.  VITAL SIGNS: Stable.  NECK: No JVD.  LUNGS: Clear.  HEART: Irregularly irregular pulse. Normal S1, S2. No audible murmur.  ABDOMEN: Soft, nontender, positive bowel sounds.  EXTREMITIES: No pedal edema.  NEUROLOGIC: The patient appears to be intact.   DIAGNOSTIC STUDIES: EKG shows Afib with rapid ventricular response. There is nonspecific ST-T changes. CT of the head was negative. His BUN/creatinine is normal. His troponin is negative.   ASSESSMENT AND PLAN:  The patient has atrial fibrillation with rapid ventricular response rate. CHADS score is at least 2 to 3. Advised IV heparin. Since he has not converted to IV amiodarone, will set him up for tomorrow for TEE and cardioversion.   Thank you very much for the referral.  ____________________________ Laurier NancyShaukat A. Khan, MD sak:NTS D: 12/05/2012 10:38:00 ET T: 12/05/2012 11:38:11 ET JOB#: [id number]  cc: Laurier NancyShaukat A. Khan, MD, <Dictator> Laurier NancySHAUKAT A KHAN MD ELECTRONICALLY SIGNED 12/27/2012 8:12

## 2014-09-08 NOTE — H&P (Signed)
PATIENT NAME:  Craig Reese, Craig Reese MR#:  [id number] DATE OF BIRTH:  12-06-1960  DATE OF ADMISSION:  07/04/2012  PRIMARY CARE PHYSICIAN:  None.  REFERRING PHYSICIAN:  Dr. Margarita Grizzle.   CHIEF COMPLAINT:  Shortness of breath and cough.   HISTORY OF PRESENT ILLNESS:  The patient is a 54 year old Hispanic male with no past medical history and not seen by any doctors in the past 20 years, has been experiencing cough for the past one week.  It was dry in nature, but today became productive and he started bringing up whitish-yellow phlegm.  The patient has been using tetracycline which he brought in from [place] and started using that since last Monday.  The patient did not notice any improvement.  Denies any sick contacts.  The cough has been getting worse and today patient suddenly became short of breath approximately for 1 to 2 hours without any chest pain.  The patient came into the ER for shortness of breath as he was unable to breathe.  Initially his blood pressure was 260/120.  White count was elevated at 18,000.  Chest x-ray has revealed bilateral pulmonary edema and his BNP was elevated at 1026.  Initial troponin is at 0.05.  The patient was given Lasix 40 mg IV and blood cultures were obtained prior to giving levofloxacin in the IV form.  Nitroglycerin 1 inch paste was attached to the right shoulder as his blood pressure was severely elevated.  The patient denies any headache or blurry vision.  After giving Lasix and applying nitro paste his blood pressure dropped to 147/73 and eventually patient's shortness of breath is significantly improved.  The patient denies any other complaints.   PAST MEDICAL HISTORY:  None.   PAST SURGICAL HISTORY:  None.   ALLERGIES:  No known drug allergies.   HOME MEDICATIONS:  Occasionally he takes Tylenol or Advil as needed basis and he has been using tetracycline for the past five days regarding cough.   PSYCHOSOCIAL HISTORY:  Lives at home.  Denies smoking.   Denies illicit drugs.  Drinks alcohol 12 beer on weekends.   FAMILY HISTORY:  Both his father and mother are deceased and he does not know the etiology of the death.    REVIEW OF SYSTEMS:  CONSTITUTIONAL:  Denies any fever, but complaining of fatigue.  Denies any weight gain or weight loss.  EYES:  No blurry vision, glaucoma, cataracts.  EARS, NOSE, THROAT:  Denies tinnitus, ear pain.  Denies hearing loss.  RESPIRATORY:  Denies any COPD.  The patient is complaining of cough for the past one week.  Denies any asthma.  Positive shortness of breath.  CARDIOVASCULAR:  Denies chest pain, palpitations, syncope. GASTROINTESTINAL:  The patient denies any nausea, vomiting, diarrhea.  No abdominal pain.  Denies GERD. GENITOURINARY:  No dysuria, hematuria.  ENDOCRINE:  Denies polyuria, nocturia, thyroid problems. HEMATOLOGIC AND LYMPHATIC:  Denies anemia, easy bruising, bleeding.  INTEGUMENTARY:  No acne, rash, lesions.  MUSCULOSKELETAL:  No joint pain.  Denies any neck pain, back pain, gout.  NEUROLOGIC:  Denies any vertigo, ataxia, dementia, headache.  PSYCHIATRIC:  Denies insomnia, ADD, OCD.   PHYSICAL EXAMINATION: VITAL SIGNS:  Temperature 97.5, pulse initially 125, eventually it was 107, respirations 28, blood pressure 147/73, pulse oximetry 95% on 2 liters.  GENERAL APPEARANCE:  Not under acute distress, well-built and well-nourished.  HEENT:  Normocephalic, atraumatic.  Pupils are equally reacting to light and accommodation.  No conjunctival injection.  No scleral icterus.  No sinus tenderness.  No postnasal drip.  NECK:  Supple.  No JVD.  No thyromegaly.  LUNGS:  Some distant breath sounds, positive rales and rhonchi.  No anterior chest wall tenderness on palpation.  CARDIOVASCULAR:  S1, S2 normal.  Regular rate and rhythm.  No anterior chest wall tenderness on palpation.  ABDOMEN:  Soft.  Bowel sounds are positive in all four quadrants.  Nontender, nondistended.  No masses felt.  NEUROLOGIC:   Awake, alert, oriented x 3.  Motor and sensory are grossly intact.  Answering questions appropriately.  SKIN:  No rashes.  No lesions.  No acne.  MUSCULOSKELETAL:  No joint effusion, tenderness or erythema.  PSYCHIATRIC:  Normal mood and affect.   LABORATORY AND IMAGING STUDIES:  CK total 311, CPK-MB 3.3, troponin 0.05.  BNP 1026, glucose 130, BUN is 16, creatinine 0.95, sodium 140, potassium 3.4, chloride 109, CO2 22.  GFR is greater than 60, anion gap is 11, serum osmolality 286, calcium 7.9, WBC 18.9, hemoglobin 12.3, hematocrit 37.1, platelet count is 262, MCV 89.  Chest x-ray, bilateral pulmonary edema, ? underlying infiltrate.  A 12-lead EKG reveals sinus tachycardia at 112 beats per minute.  Normal PR and QRS interval, nonspecific ST-T wave changes were noticed, no ST-T elevations or depressions are noticed.   ASSESSMENT AND PLAN:  This is a 54 year old Hispanic male coming to the ER with a chief complaint of one week history of cough which has been productive since today, presents with sudden onset of shortness of breath, will be admitted with the following assessment and plan.  1.  Congestive heart failure with flash pulmonary edema.  This is new onset.  We will rule out underlying acute myocardial infarction.  Provide oxygen 2 liters by nasal cannula, Lasix 40 mg IV q. 8 hours.  We will give him metoprolol 25 mg by mouth twice daily.  Aspirin is added to regimn.  We will get echocardiogram.  Cardiology consult is placed to Dr. Welton FlakesKhan.  We will check daily weights and ins and outs.  2.  Productive cough with probable underlying pneumonia.  We will give him antibiotic IV levofloxacin.  3.  New diagnosis of hypertension.  The patient is placed on metoprolol and ACE inhibitor and titrate meds as needed basis.  4.  Alcohol dependence.  The patient was counseled to stop drinking alcohol.  We will check CBC and CMP in a.m.  5.  We will provide him GI and deep vein thrombosis prophylaxis with Protonix  and Lovenox subQ.   CODE STATUS:  HE IS A FULL CODE.   The diagnosis and plan of care was discussed in detail with the help of Spanish interpreter, Mr. Alinda MoneyRaefel.    TOTAL TIME SPENT ON THE ADMISSION:  Is 60 minutes.    The patient needs to be followed up by primary care physician and rounding physician to consider sending him to an unassigned primary care physician in a.m.    ____________________________ Ramonita LabAruna Anton Cheramie, MD ag:ea D: 07/04/2012 00:33:37 ET T: 07/04/2012 06:54:08 ET JOB#: [id number]  cc: Ramonita LabAruna Inas Avena, MD, <Dictator> Ramonita LabARUNA Reinaldo Helt MD ELECTRONICALLY SIGNED 07/06/2012 6:44

## 2014-09-08 NOTE — Consult Note (Signed)
PATIENT NAME:  Craig Reese, Brylon MR#:  [id number] DATE OF BIRTH:  05-02-1961  DATE OF CONSULTATION:  02/25/2013  REFERRING PHYSICIAN:  Huey Bienenstockawood Elgergawy, MD CONSULTING PHYSICIAN:  Adrian BlackwaterShaukat Khan, MD / Verta EllenMonica A. Stana Bayon, PA-C  REASON FOR CONSULTATION: A-fib with RVR.   HISTORY OF PRESENT ILLNESS: This is a 54 year old Hispanic male who is known to our practice. He has a past medical history significant for atrial fibrillation with history of left atrial smoke (was on anticoagulation), coronary artery disease, hypertension, congestive heart failure, hyperlipidemia and hypertension. The patient yesterday complained of chest tightness, palpitations and shortness of breath, and was found to have A-fib with rapid ventricular response. The patient [facility] and was given 2 months of medication; however, he ran out of medication several days ago. The patient had IV Cardizem given and  heart rate reduced from the 150s down into the 90s. The patient has been started on oral anticoagulation. Denies any chest pain at this time.   PAST MEDICAL HISTORY: 1.  Atrial fibrillation with multiple admissions with rapid ventricular response. Had left atrial smoke on transesophageal echocardiogram and is now on warfarin. 2.  History of coronary artery disease with cardiac cath in June 2014 with LVEF 45%, moderate disease in the mid circumflex, mid LAD stent, first septal branch had 60% occlusion.  3.  Hypertension.  4.  Chronic congestive heart failure.  5.  Hyperlipidemia.  6.  Medical noncompliance.   ALLERGIES: No known drug allergies.   HOME MEDICATIONS: (The patient has not been taking any medicine for the last several days to 2 weeks). Previous medicines include the following: 1.  Aspirin 81 mg p.o. daily.  2.  Lisinopril 20 mg p.o. daily.  3.  Imdur 30 mg daily.  4.  Sublingual nitro as needed.  5.  Amiodarone 200 mg 1 tablet daily.  6.  Lovenox 100 mg subcutaneously twice  daily.  7.  Warfarin 8 mg p.o. daily.  8.  Lovastatin 20 mg at bedtime.  9.  Metoprolol tartrate 25 mg b.i.d.  10.  Lasix 60 mg p.o. daily.  11.  Omeprazole 40 mg daily.   PAST SURGICAL HISTORY: None.   SOCIAL HISTORY: The patient denies any smoking, illicit drug use. He did have heavy alcohol use in the past.  FAMILY HISTORY: No cardiac history.  REVIEW OF SYSTEMS: Very limited at this time due to language barrier. He denies any chest pain.   PHYSICAL EXAMINATION: GENERAL: This is a pleasant Hispanic male who is not in acute distress. He is alert and oriented x 3.  VITAL SIGNS: Temperature 98.6, heart rate 70, respiratory rate 20, blood pressure 133/54 and O2 sat is 96% on room air.  HEENT: Head atraumatic, normocephalic. Eyes: Pupils are round, equal. Conjunctivae are pale, pink. Ears and nose are normal to external inspection. Mouth: Moist mucous membranes.  NECK: Supple. Trachea is midline. No carotid bruit. No JVD.  LUNGS: Clear to auscultation bilaterally.  HEART: Irregularly irregular rate. No murmurs appreciated.  ABDOMEN: Nondistended. Bowel sounds are present. It is soft.  EXTREMITIES: No cyanosis, clubbing or edema.   ANCILLARY DATA: EKG initially with A-fib with rapid ventricular response.   Chest x-ray: Mild cardiomegaly.   LABORATORY DATA: Glucose 108, BUN 16, creatinine 0.89, sodium 139, potassium 3.4, chloride 106, CO2 28. Estimated GFR is greater than 60. Total protein 8.2, albumin 4.1, total bilirubin 0.2, alkaline phosphatase 127, AST 24, ALT 31. Total CK 71, CK-MB 1.2. Troponin I  0.09 followed by 0.13. White blood cell count is 10.8, hemoglobin 13.9, hematocrit 39.7 and platelet count 259,000. PT 13.2. INR 1.  ASSESSMENT AND PLAN: 1.  Atrial fibrillation with rapid ventricular response. The patient had multiple admissions for atrial fibrillation and has a history of left atrial smoke on previous TEE. He has been noncompliant with his medication. The patient was  given Cardizem which did help to control his rate, but will switch from diltiazem drip to amiodarone drip in an attempt to chemically convert. The patient has already been started on anticoagulation at this time.  2.  Elevated troponin. Most likely this is secondary to demand ischemia with atrial fibrillation with rapid ventricular response. We will get echocardiogram to look at wall motion abnormalities, also rule out any potential thrombus from atrial fibrillation. We will continue to cycle troponins. Continue aspirin, beta blocker and ACE inhibitor.  3.  Noncompliance with medication. Case management consult has been ordered by the hospitalist.  4.  History of congestive heart failure. No signs of acute decompensation at this time.  5.  Hyperlipidemia. The patient is currently on a statin.  6.  Hypotension. The patient's blood pressure has been low in the CCU and currently is on beta blocker. Lasix, ACE inhibitor, and Imdur have been on hold.   Thank you very much for this consultation and allowing [place] to participate in this patient's care. We will continue to follow this patient with you. ____________________________ Verta Ellen, PA-C mam:sb D: 02/25/2013 09:01:21 ET T: 02/25/2013 09:18:13 ET JOB#: [id number]  cc: Verta Ellen, PA-C, <Dictator> Erendida Wrenn A Jalisia Puchalski PA ELECTRONICALLY SIGNED 02/25/2013 14:43

## 2014-09-08 NOTE — Discharge Summary (Signed)
PATIENT NAME:  Craig Reese, Craig Reese MR#:  [id number] DATE OF BIRTH:  October 27, 1960  DATE OF ADMISSION:  03/25/2013 DATE OF DISCHARGE:  03/27/2013  ADMITTING PHYSICIAN: Katha Hamming, MD  DISCHARGING PHYSICIAN: Enid Baas, MD  PRIMARY CARE PHYSICIAN: At [facility].   PRIMARY CARDIOLOGIST: Laurier Nancy, MD   CONSULTATIONS IN THE HOSPITAL:  Cardiology consultation by Dr. Adrian Blackwater.   DISCHARGE DIAGNOSES: 1.  Stable angina.  2.  Atrial fibrillation with rapid ventricular response.  3.  Coronary artery disease, status post percutaneous coronary intervention.   4.  Chronic systolic congestive heart failure with ejection fraction 45%.  5.  Hypertension.  6.  Medication noncompliance.   DISCHARGE HOME MEDICATIONS:  1.  Lisinopril 20 mg p.o. daily.  2.  Imdur 30 mg p.o. b.i.d.  3.  Sublingual nitroglycerin 0.4 mg every 5 minutes p.r.n. for chest pain.  4.  Amiodarone 400 mg p.o. daily.  5.  Coumadin 7.5 mg p.o. daily.  6.  Plavix 75 mg p.o. daily.  7.  Metoprolol 25 mg p.o. b.i.d.  8.  Cardizem 180 mg p.o. daily.  9.  Lasix 40 mg p.o. daily.  10.  Potassium chloride 20 mEq p.o. daily.  11.  Pravachol 20 mg p.o. daily.   DISCHARGE DIET: Low-sodium diet.   DISCHARGE ACTIVITY: As tolerated.    FOLLOWUP INSTRUCTIONS: 1.  Follow up with Dr. Adrian Blackwater on 03/29/2013 at 3:00 p.m.  2.  Follow up with the [facility] 1 to 2 weeks.  4.  PT/INR check in 3 days at Dr. Milta Deiters office.   LABS AND IMAGING STUDIES PRIOR TO DISCHARGE:  1.  INR prior to discharge is 1.8.  2.  Sodium 139, potassium 3.5, chloride 106, bicarb 29, BUN 19, creatinine 1.08, glucose 115 and calcium of 8.7.  3.  Urinalysis negative for any infection.  4.  WBC 8.2, hemoglobin 14.3, hematocrit 40.2, platelet count 226.  5.  Chest x-ray on admission showing suboptimal inspiration, stable cardiomegaly. No acute cardiopulmonary disease. Lungs are clear on exam.  6.  BNP slightly elevated at 2206 on  admission.   BRIEF HOSPITAL COURSE: [name] is a Spanish-speaking Hispanic male with past medical history significant for coronary artery disease, status post stent placement in 2014; hypertension, systolic CHF with EF of 45%, presents to the hospital secondary to chest pain and dyspnea. The patient has had multiple admissions secondary to chronic angina in the past and also being off of his medications due to financial concerns. The patient was also noted to be in atrial fibrillation with heart rate in the 150s.  1.  Atrial fibrillation with rapid ventricular response. This time, the patient states that he has been taking his medications other than his Cardizem which he ran out of recently. He was started back on his p.o. Cardizem and also metoprolol. His INR was subtherapeutic at 1.4 so he was started on Coumadin and Lovenox at the same time. INR improved up to 1.8 prior to discharge. The patient does not have any history of prior TIAs or CVAs and so does not need immediate bridging and will have an INR check with Dr. Welton Flakes as an outpatient. His heart rate was in the 60 to 70 range prior to discharge on metoprolol and Cardizem CD. The patient is also on amiodarone.  2.  Stable angina and history of coronary artery disease, status post prior PCI. The patient has been compliant with his medications. This time, according to him he did not  have any chest pain. It was more he had dyspnea and chest tightness with deep inspiration. Troponins were negative and Dr. Park BreedKahn did not recommend any stress test this admission. Imdur dose was increased to 30 mg p.o. b.i.d. at this time. The patient's symptoms have improved since admission, not completely resolved though.  He will follow up with Dr. Welton FlakesKhan as an outpatient. His other cardiac medications including aspirin, Plavix, statin, metoprolol, lisinopril along with Imdur were being continued at this time.   3.  Chronic systolic congestive heart failure, EF of 45%. Does  not appear to be in fluid overload at this time.  He is well compensated. He is on Lasix and potassium supplements for the same, and all his other cardiac medications were continued.   His course has been otherwise uneventful in the hospital.   DISCHARGE CONDITION: Stable.   DISCHARGE DISPOSITION: Home.   TIME SPENT ON DISCHARGE: 45 minutes.   ____________________________ Enid Baasadhika Delayni Streed, MD rk:cs D: 03/27/2013 11:20:02 ET T: 03/27/2013 18:15:34 ET JOB#: [id number]  cc: Enid Baasadhika Jericho Alcorn, MD, <Dictator> [facility] Laurier NancyShaukat A. Khan, MD Enid BaasADHIKA Georgeanne Frankland MD ELECTRONICALLY SIGNED 04/10/2013 15:26

## 2014-09-08 NOTE — Discharge Summary (Signed)
PATIENT NAME:  Craig Reese, Craig Reese MR#:  [id number] DATE OF BIRTH:  May 12, 1961  DATE OF ADMISSION:  10/21/2012 DATE OF DISCHARGE:  10/22/2012  ADMITTING PHYSICIAN: Enid Baasadhika Yolanda Dockendorf, MD   DISCHARGING PHYSICIAN: Enid Baasadhika Nakeyia Menden, MD   PRIMARY CARE PHYSICIAN: Adrian BlackwaterShaukat Khan, MD   CONSULTATIONS IN THE HOSPITAL: Cardiology consultation by Dr. Adrian BlackwaterShaukat Khan.   DISCHARGE DIAGNOSES: 1.  Non-ST segment elevation myocardial infarction.  2. Acute on chronic congestive heart failure with ejection fraction of 45% with systolic dysfunction.  3.  Malignant hypertension.  4.  Intractable cough on admission secondary to congestive heart failure and bronchospasm.   DISCHARGE MEDICATIONS: 1.  Imdur 30 mg p.o. daily.  2.  Nitroglycerin sublingual 0.4 mg every 5 minutes as needed for chest pain.  3.  Lovastatin 20 mg p.o. daily.  4.  Aspirin 81 mg p.o. daily.  5.  Plavix 75 mg p.o. daily.  6.  Robitussin 5 mL q. 6 hours p.r.n. for cough.  7.  Lasix 40 mg p.o. daily.  8.  Metoprolol 12.5 mg p.o. b.i.d.  9.  Klor-Con 20 mEq tablet p.o. daily.  10.  Levaquin 500 mg p.o. daily for 3 more days.   DISCHARGE DIET: Low-sodium diet.   DISCHARGE ACTIVITY: As tolerated.    FOLLOWUP INSTRUCTIONS:  Follow up with Dr. Adrian BlackwaterShaukat Khan in 1 week.   LABS AND IMAGING STUDIES:  Prior to discharge, WBC was elevated at 16.2; that was secondary to Solu-Medrol given on admission. Hemoglobin 12.8, hematocrit 37.2, platelet count 223.   Sodium 137, potassium 3.1, chloride 102, bicarbonate 26, BUN 22, creatinine 0.92, glucose 138 and calcium of 8.4. Troponin was elevated at 0.18 and 0.12. CK and CK-MB were within normal limits. Cardiac catheterization showing global left ventricular function was mildly depressed, EF was calculated to be around 45%, moderate disease in mid circumflex and mid LAD stent with jailed first septal branch, which has 60% disease, mild LV dysfunction is also noted.  D-dimer was within normal limits. BNP is  elevated at 4511. HbA1c 5.9. LDL 57, HDL 31, total cholesterol 295105, triglycerides 37 and VLDL of 17. Chest x-ray on admission revealing findings consistent with congestive heart failure and mild interstitial edema.  No focal pneumonia is seen.   BRIEF HOSPITAL COURSE: The patient is a 54 year old, Spanish-speaking male with past medical history significant for coronary artery disease, hypertension, who was recently in the hospital about 3 months ago in 06/2012 for acute myocardial infarction and had a stent put in his mid LAD.  He comes to the hospital secondary to sudden onset of chest pain, dyspnea and intractable cough.   1.  Acute non-ST segment elevation myocardial infarction. The patient had some ST depression changes in the lateral leads with chest pain and also elevated troponin. He was admitted to Critical Care Unit, started on heparin and Integrilin drip, was taken to the cardiac catheterization the same evening, which showed a jailed septal branch from the LAD being blocked by his previous stent. He already had collaterals supplying  in that area.  Therefore, medical management was recommended. His medications were adjusted. Imdur was added. He was on nitroglycerin patch while in the hospital with improvement in his symptoms. He will follow up with Dr. Welton FlakesKhan as an outpatient. All his other cardiac medications, including his aspirin, Plavix, metoprolol and statin were continued. His stent was patent. 2.  Acute on chronic congestive heart failure with systolic dysfunction, ejection fraction of 45%, did have congestive heart failure exacerbation on admission. Diuresed with  intravenous Lasix. Changed over to p.o. Lasix at the time of discharge.  3.  Hypertension. Fairly well controlled with metoprolol, Imdur and Lasix at this point.  4.  Intractable cough was seen to be secondary to bronchospasm from his coronary artery disease and also from his congestive heart failure. Did improve after 1 dose of  Solu-Medrol intravenous, and also was on Levaquin. He will finish off a 3-day course of Levaquin for the same. Cough has improved at the time of discharge. His course has been otherwise uneventful in the hospital.   DISCHARGE CONDITION: Stable.   DISCHARGE DISPOSITION: Home.   TIME SPENT ON DISCHARGE: 45 minutes.   ____________________________ Enid Baas, MD rk:rw D: 10/25/2012 15:40:13 ET T: 10/25/2012 16:08:27 ET JOB#: [id number]  cc: Enid Baas, MD, <Dictator> Laurier Nancy, MD  Enid Baas MD ELECTRONICALLY SIGNED 11/08/2012 20:38

## 2014-09-08 NOTE — Discharge Summary (Signed)
PATIENT NAME:  Craig Reese, Craig Reese MR#:  [id number] DATE OF BIRTH:  [birth year]  DATE OF ADMISSION:  07/04/2012 DATE OF DISCHARGE:  07/06/2012  DISPOSITION:  Home.   ADMISSION DIAGNOSIS:  Shortness of breath.   DISCHARGE DIAGNOSES:   1.  Non-ST elevation myocardial infarction. 2.  Acute coronary syndrome. 3.  Congestive heart failure with flash pulmonary edema. 4.  Diastolic dysfunction, ejection fraction 50%. 5.  Underlying pneumonia. 6.  New diagnosis hypertension. 7.  Alcohol dependency.   IMPORTANT RESULTS:  Cardiac catheterization:  Ventricular function was mildly depressed, with ejection fraction calculated at 45%. Transthoracic EKG actually showed ejection fraction was much better than that.   First lesion intervention with a stent performed on a 70% lesion in the proximal LAD. Second lesion, percutaneous intervention was performed on the lesion in the proximal circumflex. Summary: Two-vessel disease with mild left ventricular dysfunction. PCI successful. Stent to proximal LAD.   LAD: 70% stenosis, mid-LAD 75% stenosis,   Proximal circumflex: 70% stenosis.   Proximal RCA: 40% stenosis.   EKG has depression of ST segments.   Potassium was decreased at 3.2, 2.8, and at discharge 4.5. Glucose was 106, 109. Creatinine was 0.99. Magnesium was 1.4, replaced; 2.3 at discharge.    LDL was 87, triglycerides were 75. HDL was decreased at 24. Hemoglobin A1c 6.2.   LFTs were within normal limits. Troponin up to 1.7.   White count was (Dictation Anomaly) <<MISSING TEXT>>. Hemoglobin was 12 on admission. Blood cultures were negative.   DISPOSITION:  Home.   MEDICATIONS ON DISCHARGE: Nitroglycerin 0.4 sublingual, aspirin 81 mg once a day, Plavix 75 mg once a day, metoprolol 25 mg take 1/2 tablet twice daily, levofloxacin 750 mg once a day, Protonix 40 mg once a day, furosemide 20 mg once a day, potassium chloride 10 mEq once a day, lovastatin 20 mg extended-release once a day.   The  patient was not discharged on an ACE inhibitor at this moment. Will plan to initiate later on with Dr. Rudell Cobb.   HOSPITAL COURSE:  Mr. Ellingson is a very nice 54 year old gentleman from [place] who has history of being overall healthy or at least not having any medical complaints. He does not see a doctor. He has not been to a doctor in a very long time. 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 Drank a good amount of beers over the weekend, and he presented with a history of a cough for the past week, dry in nature, but it became productive, started bringing yellowish sputum.   He was given tetracycline from a friend who got it from [place], and that did not really make an improvement. For that reason, he came to the ER. He became very short of breath 1 or 2 hours prior to admission, and he felt like he was about to die.   The patient felt like he was drowning inside his chest. His blood pressure was 260/120, his white count was elevated at 18,000, as mentioned above, his BNP was elevated at 1026.   His initial troponin was 0.05. He received Lasix and was put on levofloxacin. He was admitted to the (Dictation Anomaly) <<MISSING TEXT>>. He was doing okay. The day he was going for his echocardiogram, he started developing significant shortness of breath. The patient went down to the echocardiogram, but was rushed back to his room.   At that moment, the patient was severely diaphoretic and started having some chest pain. EKG was done right away  showing changes of (Dictation Anomaly) <<MISSING TEXT>> with depression of ST segments and T-wave inversions in lateral leads and septal leads.   The patient was transferred to the CCU. He was put on IV beta blocker, nitroglycerin, aspirin, Plavix, and Integrilin drip was given. The patient was stable during that day, and the following day, he underwent a cardiac catheterization.  His cardiac catheterization, as indicated,  showed blockages of the LAD in proximal and distal, for which he had a PCA placement and a stent placement (Dictation Anomaly) <<MISSING TEXT>>.  The patient was discharged home on a beta blocker, aspirin, Plavix. Since all of these were new medications for him, we held on an ACE inhibitor, as his blood pressures were low, 100/50, so he needs an ACE inhibitor to be started on soon as his ejection fraction was slightly decreased at 40%.   The patient is discharged in good condition. Dr. (Dictation Anomaly) <<MISSING TEXT>> is going to see him in the office. They will provide prescriptions and continuation of his medications.   TIME SPENT: I spent about 40 minutes with discharge of this patient the day of discharge.     ____________________________ Felipa Furnaceoberto Sanchez Gutierrez, MD rsg:dm D: 07/10/2012 22:22:00 ET T: 07/11/2012 07:34:00 ET JOB#: [id number]  cc: Felipa Furnaceoberto Sanchez Gutierrez, MD, <Dictator> Dr. Rudell CobbKent

## 2014-09-08 NOTE — Consult Note (Signed)
  DATE OF BIRTH:  [birth year]  DATE OF CONSULTATION:  07/04/2012  CONSULTING PHYSICIAN:  Scot Junobert T. Elliott, MD  REPORT OF CONSULTATION:  The patient is a 54 year old Hispanic male who was admitted with flash pulmonary edema, and thought to have a non-ST MI. Was taken to the cath lab, and a stent was put in the LAD. He was given Integrilin drip as well as nitroglycerin drip and Lovenox shots, a loading dose of Plavix and aspirin. He had nasal cannula on, and there was noted some bleeding, some blood in the cannula, and the nurse thought that there was a nosebleed and that he may have been coughing up some blood that trickled down his throat. He has not seen any blood since this morning. He has not had any vomiting, not had any melena. No bright red blood per rectum. He denies ever having any of these in the past.   Currently he denies any abdominal pain. When he drinks water and takes his medicine, there is no chest pain or esophageal pain. His hemoglobin has remained stable.   PHYSICAL EXAMINATION:  GENERAL:  Hispanic male in no acute distress, interviewed with an interpreter.  VITAL SIGNS:  Temp 99.3, pulse 84, respirations 24, blood pressure 137/60, 92% on 2 liters.  HEENT:  Sclerae nonicteric, conjunctivae negative.  HEAD:  Atraumatic.  CHEST:  Clear in anterolateral fields.  HEART:  Shows no murmurs or gallops I can hear.  ABDOMEN: Soft, nontender. No hepatosplenomegaly. No masses. No bruits.   LAB DATA:  White count 18.9 on the 15th, 16.1 on the 16th, hemoglobin 11.2 on the 16th, hemoglobin this morning 12.2, hemoglobin this afternoon 12.3, white count 12.1, platelet count 250.   ASSESSMENT: Bleeding, probably from nasal cannula irritation in the nose and then coughing up some blood rather than vomiting. His hemoglobin has remained stable. He is still on Integrilin, which can be continued, and he can continue to take his Plavix and aspirin. Lovenox has been held. This can be restarted if  felt necessary. I have ordered him to have a clear liquid diet tonight and a full liquid diet in the morning.      ____________________________ Scot Junobert T. Elliott, MD rte:mr D: 07/05/2012 18:42:00 ET T: 07/05/2012 19:03:15 ET JOB#: [id number]  cc: Felipa Furnaceoberto Sanchez Gutierrez, MD Yevonne PaxSaadat A. Khan, MD Scot Junobert T. Elliott, MD, <Dictator>      Scot JunOBERT T ELLIOTT MD ELECTRONICALLY SIGNED 07/08/2012 7:59

## 2014-09-08 NOTE — H&P (Signed)
PATIENT NAME:  Craig Reese, Craig Reese MR#:  [id number] DATE OF BIRTH:  [birth year]  DATE OF ADMISSION:  02/25/2013  REFERRING PHYSICIAN:  Dr. Janalyn Harder.    PRIMARY CARE PHYSICIAN: None.   CARDIOLOGY:  Dr. Adrian Blackwater.   CHIEF COMPLAINT: Palpitations.   Spanish interpreter  [place] was used.   CHIEF COMPLAINT: Palpitation, heart racing  HISTORY OF PRESENT ILLNESS: This is a 54 year old male with significant past medical history of atrial fibrillation with a history of left atrial smoke, where he is on anticoagulation for that, history of coronary artery disease, hypertension, chronic congestive heart failure, hyperlipidemia, hypertension, who presents with complaints of heart racing, palpitation. The patient reports he was recently and [facility] for similar complaints last month, where he ran out initially of his medication.  He was admitted to [facility] where he was discharged with a total of two months' supply of oral meds, including subcutaneous Lovenox, bridging dose until his INR becomes therapeutic. The patient reports ran out for two weeks assist and since then has not been taking any medication. The patient reports this evening he started to have an episode of palpitations and heart racing all of a sudden, as well was accompanied by some mild shortness of breath and chest discomfort. Upon presentation to the ED, the patient was found to be in atrial fibrillation with RVR. EKG showing heart rate at 127. The patient's heart rate at one point was in the 150s. The patient is received IV Cardizem 20 mg x 2 and p.o. Cardizem 30 mg p.o. with temporary improvement of his heart rate, so he was started on Cardizem drip currently. The patient currently denies any chest pain, any shortness of breath. The patient was ordered 324 mg oral aspirin. The patient's first troponin came back elevated at 0.09.  By reviewing the patient's old blood work, it seems it has been elevated as well in the past, but last value was  within normal limit 0.05. The patient's INR was at 1. As mentioned earlier, he was not taking any meds for the last two weeks, hospitalist service was requested to admit the patient for further management and work-up of his atrial fibrillation with RVR.   PAST MEDICAL HISTORY: 1.  Atrial fibrillation with multiple admissions with RVR due to noncompliance with meds. As well, the patient was found to have atrial smoke on transesophageal echocardiogram where he is being fully anticoagulated.  2.  History of coronary artery disease with cardiac cath done in June 2014, showing ejection fraction of 45%, with moderate disease in the mid circumflex, mid LAD stent with first septal branch with which has 60% disease.  3.  History of hypertension.  4.  Chronic congestive heart failure.  5.  Hyperlipidemia.   ALLERGIES: None.   HOME MEDICATIONS: A stated earlier, the patient has not been taking any meds for the last two weeks, but he was discharged from [facility] on these meds:  1.  Aspirin 81 mg oral daily.  2.  Lisinopril 20 mg oral daily.  3.  Imdur 30 mg oral daily.  4.  Sublingual nitroglycerin as needed.  5.  Amiodarone 200 mg oral daily.  6.  Lovenox 100 mg subcutaneous 2 times a day.  7.  Warfarin 8 mg oral daily.  8.  Lovastatin 20 mg oral daily.  9.  Metoprolol tartrate 25 mg oral 2 times a day.  10.  Lasix 60 mg oral daily.  11.  Omeprazole 40 mg oral daily.   PAST SURGICAL HISTORY: None.  SOCIAL HISTORY: Denies any smoking, any alcohol abuse or illicit drug abuse. Currently he is unemployed.   FAMILY HISTORY: There is cardiac history in the family, as the patient recalls.    REVIEW OF SYSTEMS: CONSTITUTIONAL: The patient denies any fever, chills, fatigue, weakness, weight gain, weight loss.  EYES: Denies blurry vision, double vision, inflammation, glaucoma.  ENT: Denies any tinnitus, ear pain, hearing loss, epistaxis or discharge.   RESPIRATORY: Denies any cough, productive sputum,  wheezing, chronic obstructive pulmonary disease, reports mild shortness of breath initially, currently resolved.  CARDIOVASCULAR: Has complaints of some chest pressure and palpitations. Denies any syncope, dyspnea on exertion, orthopnea or lower extremity swelling.  GASTROINTESTINAL: Denies any nausea or vomiting, diarrhea, abdominal pain, hematemesis, melena.  GENITOURINARY: Denies dysuria, hematuria, or renal colic.  ENDOCRINE:  Denies polyuria, polydipsia, heat or cold intolerance.  HEMATOLOGY: Denies easy bleeding or bruising.  SKIN:  Denies acne, rash or skin the skin lesions.  MUSCULOSKELETAL: Denies any pain in the neck, shoulder, joints.  NEUROLOGIC: Denies any history of CVA, transient ischemic attack, seizures, headache, lightheadedness or dizziness.  PSYCHIATRIC: Denies any anxiety, insomnia, no ADD.   PHYSICAL EXAMINATION: VITAL SIGNS: Temperature 98.3, pulse 100, respiratory rate 20, blood pressure 94/63, saturating 96% on room air.  GENERAL: Well-nourished male, appears comfortable and no apparent distress.  HEENT: Head atraumatic, normocephalic. Pupils equal, reactive to light. Pink conjunctivae. Anicteric sclerae. Moist oral mucosa.  NECK: Supple. No thyromegaly. No JVD.  CHEST: Good air entry bilaterally. No wheezes, rales, rhonchi.  CARDIOVASCULAR: S1, S2 heard. No rubs, murmurs, gallops. Regular rate, tachycardiac.  ABDOMEN: Obese, soft, nontender, nondistended. Bowel sounds present.  EXTREMITIES: No edema. No clubbing. No cyanosis. Dorsalis pedis pulse and radial pulse felt bilaterally.  PSYCHIATRIC: Appropriate affect. Awake, alert x 3. Intact judgment and insight.  NEUROLOGIC: Cranial nerves grossly intact. Motor 5/5. No focal deficits.  LYMPHATIC: No cervical or supraclavicular lymphadenopathy.  MUSCULOSKELETAL: No joint effusion or erythema.   PERTINENT LABORATORY DATA: Glucose 187, BUN 16, creatinine 0.89, sodium 139, potassium 3.4, chloride 106, CO2 28. Troponin  0.09. White blood cells 10.8, hemoglobin 13.9, hematocrit 39.7, platelets 259, INR is 1.   EKG showing atrial fibrillation with RVR at 127 beats per minute.    ASSESSMENT AND PLAN: 1.  Atrial fibrillation with rapid ventricular response.  This is most likely due to noncompliance with medication. The patient was given IV Cardizem push x 2 in the ED, with oral Cardizem only with temporary relief, so he is started on Cardizem drip. He will be admitted to Critical Care Unit. He will be continued on Cardizem drip. He will be resumed back on his oral metoprolol and amiodarone, and hopefully will transition from IV to oral meds. Meanwhile, we will resume him back on anticoagulation especially with transesophageal echo in past showing evidence of smoke,  so he will be started on Lovenox and will consult pharmacy to dose his warfarin.  2.  Chest pain with elevated troponins. This is most likely due to atrial fibrillation with rapid ventricular rate, and demand ischemia, currently his chest pain is resolved. The patient was given 324 mg of aspirin. Continue to cycle his troponin and follow the trend. We will consult cardiology. The patient is already on aspirin, beta blocker, ACE inhibitor and on anticoagulation.  3.  History of noncompliance with medication. The patient was counseled at length. Case management consult was ordered.  4.  History of chronic congestive heart failure. Seems to be stable and compensated at this  point. The patient is already on beta blockers, ACE inhibitor, aspirin and statin.  5.  Hyperlipidemia. Continue with statin.  6.  Hypertension. Currently, blood pressure seems to be on the lower side, so will hold his Imdur,  Lasix and ACE inhibitor. We will continue only with beta blockers, mainly for heart rate control.  7.  Deep vein thrombosis prophylaxis. The patient is on full dose anticoagulation for his atrial fibrillation.   CODE STATUS: Full code.   Total time spent on admission  and patient care: 55 minutes    ____________________________ Starleen Arms, MD dse:cc D: 02/25/2013 01:31:17 ET T: 02/25/2013 01:44:58 ET JOB#: [id number]  cc: Starleen Arms, MD, <Dictator> Carleigh Buccieri Teena Irani MD ELECTRONICALLY SIGNED 02/26/2013 3:39

## 2014-09-08 NOTE — H&P (Signed)
PATIENT NAME:  Craig Reese, MCCLANAHAN MR#:  [id number] DATE OF BIRTH:  1960/06/09  DATE OF ADMISSION:  10/21/2012  ADMITTING PHYSICIAN:  Dr. Gladstone Lighter  PRIMARY CARE PHYSICIAN:  Dr. Neoma Laming  REASON FOR ADMISSION:  Cough, dyspnea and chest pain.   HISTORY OF PRESENT ILLNESS:  Mr. Craig Reese is a 54 year old Spanish-speaking Hispanic male with past medical history significant for hypertension and recent admission February 2014 for her non-ST segment elevation MI at which time he had a cardiac cath and had a LAD stent placed in for 75% stenosis. He was doing fine up until this morning. 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 This morning he was sleeping and got awakened secondary to very dry cough associated with severe diaphoresis, chest tightness and also dyspnea; so he presented to the Emergency Room. His lab work showed a slightly elevated white count of 13,000 and first set of troponins elevated at 0.31. He appears in extreme distress secondary to his dry cough especially when talking associated with diaphoresis and also dyspnea. Chest x-ray shows some pulmonary edema and he is being admitted for CHF and also NSTEMI.  PAST MEDICAL HISTORY: 1.  Hypertension.  2.  Coronary artery disease with recent LAD stent in February 2014.  3.  Congestive heart failure with systolic dysfunction, EF of 45% from the last cardiac catheterization. 4.  Hypertension.  PAST SURGICAL HISTORY:  None.   ALLERGIES:  No known drug allergies.  CURRENT HOME MEDICATIONS: 1.  Aspirin 81 mg p.o. daily.  2.  Plavix 75 mg p.o. daily.  3.  Nitroglycerin sublingual 0.4 mg p.o. every 5 minutes for chest pain as needed. 4.  Metoprolol 12.5 mg p.o. b.i.d.  5.  Protonix 1 tab 40 mg p.o. daily.  6.  Lasix 20 mg p.o. daily. 7.  Potassium chloride 10 mEq p.o. daily.  8.  Lovastatin 20 mg p.o. daily.   SOCIAL HISTORY:  The patient denies any smoking history. He used to  drink alcohol in the past, but stopped several years ago.   FAMILY HISTORY:  Both parents are deceased and deceased  of unknown causes. No cardiac history as far as he can remember.  REVIEW OF SYSTEMS:  CONSTITUTIONAL:  No fever, fatigue or weakness.  EYES:  No blurred vision, double vision, glaucoma or cataracts.  ENT:  No tinnitus, ear pain, hearing loss, epistaxis or discharge. RESPIRATORY:  Positive for dry cough. No wheeze, hemoptysis or COPD.  CARDIOVASCULAR: Positive for chest pain and orthopnea. No arrhythmias. Positive for dyspnea on exertion. No consultations or syncope.  GASTROINTESTINAL: Positive for nausea.  No vomiting, diarrhea, abdominal pain, hematemesis or melena.  GENITOURINARY:  No dysuria, hematuria, renal calculus, frequency or incontinence.  ENDOCRINE:  No polyuria, nocturia, thyroid problems, heat or cold intolerance.  HEMATOLOGY:  No anemia, easy bruising or bleeding.  SKIN:  No acne, rash or lesions.  MUSCULOSKELETAL:  No neck, back, shoulder pain, arthritis or gout.  NEUROLOGIC:  No numbness, weakness, CVA, TIA or seizures.  PSYCHOLOGICAL:  No anxiety, insomnia or depression.   PHYSICAL EXAMINATION: VITAL SIGNS:  Temperature 98.4 degrees Fahrenheit, pulse 91, respirations 18, blood pressure 182/77, pulse ox 88% on room air.  GENERAL: Well-built, well-nourished man sitting in bed, appears to be in acute respiratory distress secondary to cough and dyspnea.  HEENT: Normocephalic, atraumatic. Pupils equal, round, reacting to light. Anicteric sclerae. Extraocular movements intact. Oropharynx is clear. Normal tongue. No obvious swelling seen. No asthma,  mass or exudates. Nasopharynx showing no lesions or drainage. Ears:  No external lesions or drainage noted.  NECK:  Supple. No thyromegaly, JVD or carotid bruits. No lymphadenopathy. Full range of motion of neck without pain is present.  LUNGS:  Moving air bilaterally but has diffuse crackles at the bases. No minimal use  of accessory muscles on minimal exertion. No wheeze.  CARDIOVASCULAR:  S1, S2 regular rate and rhythm. No murmurs, rubs or gallops.  ABDOMEN: Obese, soft, nontender, nondistended. No hepatosplenomegaly. Normal bowel sounds. No guarding, rigidity or rebound tenderness.  MUSCULOSKELETAL: Normal gait and station.  EXTREMITIES:  Moving.  No joint effusion or tenderness seen.  No pedal edema.  2+ dorsalis pedis pulses palpable bilaterally.  No clubbing or cyanosis.  SKIN:  No acne, rash or lesions.  LYMPHATICS: No cervical lymphadenopathy. NEUROLOGIC:  Cranial nerves II through XII are intact. Normal deep tendon reflexes. Motor strength is symmetrical and equal in all 4 extremities and normal sensation intact and deep tendon reflexes are 2+ bilateral lower and upper extremities. PSYCHOLOGIC:  The patient's mood is within normal limits and he is alert, oriented x 3.   LABORATORY DATA:  WBC 13.7, hemoglobin 12.4, hematocrit 36.7, platelet count 205.  Sodium 139, potassium 3.3, chloride 105, bicarb 28, BUN 14, creatinine 1.2, glucose 118 and calcium of 8.5.  ALT 18, AST 18, alk phos 104, total bilirubin 0.9 and albumin of 3.6. D-dimer is 0.35. BNP is slightly elevated at 4511. First troponin is 0.3, CK 123, CK-MB 1.4.  Chest x-ray showing pulmonary interstitial edema findings. No focal pneumonia.  EKG showing normal sinus rhythm, heart rate of 88. T-wave inversions noted in the lateral leads V4 to V6.   ASSESSMENT AND PLAN: A 54 year old Spanish-speaking male with past medical history significant for hypertension and congestive heart failure and coronary artery disease with recent left anterior descending stent in February 2014 admitted for chest pain, dyspnea and intractable cough.  1.  Non-ST segment elevation myocardial infarction. Will admit to Critical Care Unit. Cardiology has been consulted for possible cardiac catheterization today. Continue to recycle enzymes and monitor on telemetry. He is  started on heparin drip, Integrilin drip. Continue aspirin, Plavix, beta blocker and statin. 2.  Acute on chronic congestive heart failure exacerbation with acute hypoxic respiratory failure. He was hypoxic when he came in last year for 45%. Will give one dose of IV Lasix stat and start on Lasix b.i.d., continue medications and follow cardiac catheterization reports.  3.  Dry cough intractable, likely from his congestive heart failure. No bronchospasm noted, but cannot be ruled out from his underlying coronary artery disease. Will give one dose of Solu-Medrol, empiric Levaquin though chest x-ray did not show any infiltrates and cough medicine p.r.n.  Hopefully the Lasix IV should help and hold off any IV fluids at this time.  4.  Hypertension. Continue metoprolol and IV hydralazine p.r.n. for now.  5.  Gastrointestinal and deep vein thrombosis prophylaxis.  Ranitidine and on heparin drip.  CODE STATUS:  FULL CODE.  TIME SPENT ON ADMISSION:  65 minutes    ____________________________ Gladstone Lighter, MD rk:ce D: 10/21/2012 10:43:17 ET T: 10/21/2012 11:00:10 ET JOB#: [id number]  cc: Gladstone Lighter, MD, <Dictator> Dionisio David, MD  Gladstone Lighter MD ELECTRONICALLY SIGNED 11/08/2012 13:46

## 2014-09-09 NOTE — Consult Note (Signed)
PATIENT NAME:  Craig Reese, Craig Reese MR#:  [id number] DATE OF BIRTH:  1961-04-15  DATE OF CONSULTATION:  08/07/2013  CONSULTING PHYSICIAN:  Laurier NancyShaukat A. Annaliah Rivenbark, MD  INDICATION FOR CONSULTATION: CHF.  HISTORY OF PRESENT ILLNESS: This is a 54 year old Hispanic male with a past medical history of chronic atrial fibrillation, coronary artery disease, came to the Emergency Room with severe shortness of breath. He ended up being intubated after initially being put on BiPAP. He got worse and got intubated. I was asked to evaluate the patient because of CHF. The patient is sedated, unable to get much history.   PAST MEDICAL HISTORY: History of hypertension, CHF, chronic A. fib, coronary artery disease, hyperlipidemia.   SOCIAL HISTORY: No history documented of smoking or drinking.   PAST SURGICAL HISTORY: Apparently had a PCI and stenting in the past.   ALLERGIES: None.   FAMILY HISTORY: Positive for coronary artery disease.   PHYSICAL EXAMINATION: GENERAL: He is alert and oriented x 0, intubated, sedated.  VITAL SIGNS: Blood pressure is 101/50, respirations 14. Pulse is 80. Saturation is 96. Temperature is 99.3.  NECK: Positive JVD.  LUNGS: Good air entry.  HEART: Tachycardic. Normal S1, S2. No audible murmur.  ABDOMEN: Soft, nontender, positive bowel sounds.  EXTREMITIES: No pedal edema.  NEUROLOGIC: The patient is intubated and sedated.   LABORATORY, DIAGNOSTIC, AND RADIOLOGICAL DATA: EKG shows sinus tachycardia, 115 beats per minute, left axis deviation, left atrial enlargement, nonspecific ST-T changes. Chest x-ray showed bilateral pulmonary edema. Troponin is mildly elevated at 0.11 and 0.08. Blood cultures are negative. ABG shows a pH of 7.24, pCO2 of 60, pO2 of 123. White count is 17.5; hemoglobin is 12.7 and hematocrit 38.1.   ASSESSMENT AND PLAN: The patient has congestive heart failure with history of coronary artery disease. The patient is currently on heparin drip, Lasix 40 IV q.12. We will  get an echocardiogram to evaluate ejection fraction, also will place the patient on enalapril and Coreg, and will follow the patient closely with you.   Thank you very much for the referral.   ____________________________ Laurier NancyShaukat A. Avid Guillette, MD sak:jcm D: 08/07/2013 18:35:17 ET T: 08/07/2013 18:52:40 ET JOB#: [id number]  cc: Laurier NancyShaukat A. Christina Gintz, MD, <Dictator> Laurier NancySHAUKAT A Ariadna Setter MD ELECTRONICALLY SIGNED 09/23/2013 11:02

## 2014-09-09 NOTE — Discharge Summary (Signed)
PATIENT NAME:  Craig Reese, Craig Reese MR#:  [id number] DATE OF BIRTH:  1960/11/28  DATE OF ADMISSION:  08/07/2013 DATE OF DISCHARGE:  08/19/2013   DISPOSITION: Skilled nursing facility.   CONSULTANTS: Dr. Erin FullingKurian Kasa, Dr. Clydie Braunavid Fitzgerald, Dr. Adrian BlackwaterShaukat Khan of cardiology.   Please refer to discharge summary dictated by Dr. Imogene Burnhen on August 17, 2013. My discharge summary will cover mostly the days of the 2nd and 3rd.   DISCHARGE DIAGNOSES: Remain the same: 1.  Acute respiratory failure.  1.  Acute diastolic congestive heart failure.  3.  Pneumonia.  4.  Sepsis.  5.  Gardnerella bacteremia.  6.  Urinary tract infection.  7.  Elevation of troponins due to demand ischemia.  8.  Acute kidney failure.  9.  Coronary artery disease.  10.  Atrial fibrillation with rapid ventricular response.  11.  The patient underwent intubation and was on a ventilator.   MEDICATIONS AT DISCHARGE: Isosorbide 30 mg twice daily, nitroglycerin 0.4 mg as needed for chest pain, Plavix 75 mg once a day, metoprolol 25 mg twice daily, potassium 20 mEq once a day, Pravachol 20 mg once a day, metronidazole 500 mg every 8 hours for 2 days, lisinopril 5 mg once a day, Flomax 0.4 mg once a day, amiodarone 200 mg once a day, Lovenox 40 mg once a day, aspirin 81 mg once daily, diltiazem 60 mg every 6 hours, furosemide 40 mg twice daily.   FOLLOWUP: Dr. Adrian BlackwaterShaukat Khan, cardiology at [facility]. in 2 weeks. Referral to [facility].   HOSPITAL COURSE: As mentioned above, there is a discharge summary by Dr. Imogene Burnhen dictated on April 1 that covers the date of hospitalization up to 08/16/2013. The patient has the same discharge diagnoses. The patient was admitted to the hospital with acute respiratory failure on severe CHF, pulmonary edema. The patient unfortunately is noncompliant with his medications and with medical visits. He says he was taking his medications on and off since the last discharge, but he stopped taking most of  them 2 months prior to this admission, especially Coumadin. He has been off Coumadin and blood thinners for over 2 months. The patient was evaluated in the Emergency Department, was intubated and transferred to the Critical Care Unit. As far as his acute respiratory failure, the patient had this due to congestive heart failure. The patient was extubated on 08/16/2013 and had increased secretions and cough. Incentive spirometry was started. The patient was on high-flow nasal cannula, and he was able to come out of that on 08/18/2013. Right now, the patient is on room air. The patient continued to have Lasix at 40 mg twice daily orally since the patient had significant pulmonary edema. At this moment, he is euvolemic. As far as his acute respiratory failure, since this is secondary to CHF and the patient has underlying coronary artery disease, is status post PCI, the patient continues on aspirin, Plavix, Coreg, and statin.  As far as his sepsis, the patient had gram-negative rod sepsis secondary to Gardnerella. Blood cultures were negative on the repeat blood cultures. The patient was treated with meropenem and Flagyl. A total of 14 days was recommended starting on March 22. The patient is going to be going to the facility with 2 more days of Flagyl alone. I discussed this with Dr. Sampson GoonFitzgerald. He also had a UTI, which was already treated.   His elevation of troponins was secondary to acute CHF and atrial fibrillation.   He developed acute kidney failure that resolved.  For his atrial fibrillation, the patient was on a heparin drip but developed vomiting with black coffee-ground emesis, for which he was taken off the heparin and started on low dose of Lovenox. At this moment, I do not feel like he is safe to be anticoagulated, again due to his noncompliance, for which we are going to allow him to be off anticoagulation, only on aspirin, and let him follow up with Dr. Adrian Blackwater if possible. If the patient  shows signs of compliance, will start on medication again.   Today, I have talked to them at length. We used an interpreter to talk to the Child psychotherapist. I helped with interpretation myself. We had long discussions about his care. I have been in and out of his room helping him with understanding what his medical situation is, and I spoke with social workers at length as well.   I spent 75 minutes with this discharge today.    ____________________________ Felipa Furnace, MD rsg:jcm D: 08/19/2013 16:27:19 ET T: 08/19/2013 17:04:43 ET JOB#: [id number]  cc: Felipa Furnace, MD, <Dictator> Milessa Hogan Juanda Chance MD ELECTRONICALLY SIGNED 08/23/2013 21:23

## 2014-09-09 NOTE — Discharge Summary (Signed)
PATIENT NAME:  Craig Reese, Craig Reese MR#:  [id number] DATE OF BIRTH:  Jan 30, 1961  DATE OF ADMISSION:  08/07/2013  DATE OF DISCHARGE: 08/16/2013   CONSULTANTS:  Dr. Belia HemanKasa, cardiology Dr. Welton FlakesKhan.   CURRENT DIAGNOSES:  1.  Acute respiratory failure due to acute diastolic congestive heart failure.  2.  Acute on chronic diastolic congestive heart failure.  3.  Pneumonia.  4.  Sepsis.  5.  Urinary tract infection.  6.  Elevated troponin due to demand ischemia.  7.  Acute renal failure.  8.  Coronary artery disease.  9.  Atrial fibrillation with rapid ventricular response.   PROCEDURE: Intubation, central line placement, and ventilation.   REASON FOR ADMISSION: Shortness of breath.   HOSPITAL COURSE: The patient is a 54 year old male with a history of CHF, chronic AFib, CAD, was sent to ED due to shortness of breath two hours prior to this admission. The patient was intubated in the ED due to respiratory failure and then put on ventilation. For detailed history and physical examination, please refer to the admission note dictated by me.   1.  Acute respiratory failure, possibly due to acute congestive heart failure and pneumonia. After admission, the patient has been placed on ventilation for about 10 days. During this period, the patient has been treated with Lasix, antibiotics. The patient, we tried several times to wean off ventilation, but failed. Patient was finally extubated 2 days ago and now on high flow oxygen.  2.  Acute on chronic diastolic congestive heart failure. After admission, the patient has been treated with Lasix b.i.d. IV. Patient has also had elevated troponin, probably due to congestive heart failure. Dr. Welton FlakesKhan suggests continue Lasix and start aspirin, Plavix, Coreg and statin. The patient was on heparin drip.  3.   Atrial fibrillation. Patient developed atrial fibrillation with rapid ventricular response. The patient was put on Cardizem and amiodarone drip with heparin drip according  to Dr. Milta DeitersKhan's suggestion. Since patient has coffee ground vomiting heparin drip was discontinued. The patient's heart rate is under control.   4.  Sepsis with pneumonia and urinary tract infection. The patient's blood culture showed gram-negative rods. ID consult was requested. Dr. Sampson GoonFitzgerald suggested meropenem and Flagyl for 14 days.  5.  Acute renal failure likely due to ATN, improved after IV fluid support. The patient is still on high flow oxygen. We will continue current treatment.   FOLLOWUP:   Dr. Welton FlakesKhan and Dr. Belia HemanKasa.    ____________________________ Shaune PollackQing Aldon Hengst, MD qc:tc D: 08/17/2013 16:10:23 ET T: 08/17/2013 20:53:14 ET JOB#: [id number]  cc: Shaune PollackQing Yurani Fettes, MD, <Dictator> Shaune PollackQING Manha Amato MD ELECTRONICALLY SIGNED 08/18/2013 18:05

## 2014-09-09 NOTE — H&P (Signed)
PATIENT NAME:  Craig Reese, Craig Reese MR#:  [id number] DATE OF BIRTH:  [birth year]  DATE OF ADMISSION:  08/07/2013  PRIMARY CARE PHYSICIAN: None local.   REFERRING PHYSICIAN: Dr. Dolores Frame   CHIEF COMPLAINT: Shortness of breath today.   HISTORY OF PRESENT ILLNESS: A 54 year old male with a history of congestive heart failure, chronic atrial fibrillation, coronary artery disease, was sent by EMS to ED due to shortness of breath two hours prior to the ED. The patient was intubated in the ED. According to Dr. Dolores Frame, the patient suddenly developed shortness of breath this morning two hours prior to ED. EMS sent the patient to ED. The patient was noted to have respiratory failure with oxygen saturation 80s,  blood pressure was high more than 200. The patient's chest x-ray shows severe pulmonary edema. The patient was put on BiPAP initially without relief. Then, the patient was intubated and placed on ventilation. The patient had some pink secretion suctioned from ventilation tube. The patient is placed on observation, heparin drip. Since the patient has low-grade fever the patient was treated with Vanco and Zosyn. Dr. Shaune Pollack tried to contact a family member. Since the patient's family members speaks Spanish, Dr. Shaune Pollack is waiting for interpreter.   PAST MEDICAL HISTORY: Hypertension, chronic CHF, chronic atrial fibrillation, coronary artery disease, hyperlipidemia.   SOCIAL HISTORY: According to previous documents, no smoking or drinking or illicit drugs.   PAST SURGICAL HISTORY: Coronary artery disease with stent placement.   ALLERGIES: No.   FAMILY HISTORY: Cardiac history in his family.   HOME MEDICATIONS: Valium 5 mg p.o. 4 times a day p.r.n., Pravachol 20 mg p.o. once daily, potassium 20 mEq once daily, nitroglycerin 0.4 mg 1 tablet sublingual every five minutes p.r.n. for chest pain, Lopressor 25 mg p.o. b.i.d., lisinopril 20 mg p.o. once a day, Imdur 30 mg p.o. b.i.d., ibuprofen 800 mg p.o. t.i.d. p.r.n., Lasix 40  mg p.o. once a day Lovenox 18 mg injectable every 12 hours, Coumadin 7.5 mg p.o. daily, Plavix 75 mg p.o. daily, Cardizem 180 mg p.o. once daily, amiodarone 400 mg p.o. daily.   REVIEW OF SYSTEMS: Unable to obtain at this time.   PHYSICAL EXAMINATION: VITAL SIGNS: Temperature 100.7, blood pressure was 199/99 and now decreased to 175/83, decreased  to 103/75, pulse 115, oxygen saturation 100% on ventilation.  GENERAL: The patient is intubated without response on sedation.  HEENT: Pupils round, equal and reactive to light. A lot of pink secretions suctioned from trach tube. No discharge from nose or ear.  NECK: Supple. No JVD or carotid bruit. No lymphadenopathy. no thyromegaly.  CARDIOVASCULAR: S1 and S2. Regular rate and tachycardia. No murmurs or gallops.  PULMONARY: Bilateral air entry, bilateral  crackles. No wheezing.  ABDOMEN: Obese, soft, weak bowel sounds, no organomegaly, no distention.  EXTREMITIES: Trace edema. No clubbing or cyanosis. Bilateral pedal pulses present.  SKIN: No rash or jaundice.  NEUROLOGY: Unable to examine due to the patient's ventilation status.  LABORATORY DATA: ABG showed pH of 7.24, pCO2 of 60, pO2 123 with FiO2 of 75%.   Chest x-ray shows bilateral pulmonary edema, endotracheal tube grossly in good position.    CK 186, CK-MB 1.6, glucose 117, BUN 15, creatinine 1.15. Electrolytes are normal. WBC 17.5, hemoglobin 12.7, platelets 215. INR 1.1. Troponin 0.08. TSH 1.57, BNP 2326.   EKG shows sinus tachycardia at 115 BPM, left axis deviation   IMPRESSIONS: 1. Acute respiratory failure, possibly due to acute congestive heart failure.  2. Acute on chronic congestive heart  failure, ejection fraction 45%, possible diastolic dysfunction.  3. Hypertension.  4. Coronary artery disease.  5. Hyperlipidemia.  6. History of atrial fibrillation, now sinus tachycardia.  7. Obesity.   PLAN OF TREATMENT: 1. The patient will be admitted to Critical Care Unit. We will  keep n.p.o. Continue ventilation. Pulmonary consult.  2. Start Lasix IV. Start congestive heart failure protocol. Follow up troponin level. The elevated troponin level is possibly due to demand ischemia from congestive heart failure and respiratory failure.  3. We will follow up cardiology consult from Dr. Welton FlakesKhan, who is the patient's cardiologist.  4. The patient blood pressure now is 103/75. The patient may need Levophed if the patient's pressure decreased. We will continue heparin drip.  5. Since the patient has low high-grade fever, we need to draw blood culture. We will follow up CBC, blood culture, urinalysis, BMP and magnesium level. We will continue vanc and Zosyn. I discussed the patient's critical condition with Dr. Dolores FrameSung and Dr. Shaune PollackLord. Dr. Shaune PollackLord is going to put a central line. She is waiting for interpreter to contact to talk with patient's family member.   CRITICAL TIME: About 66 minutes    ____________________________ Shaune PollackQing Zerek Litsey, MD qc:sg D: 08/07/2013 08:08:00 ET T: 08/07/2013 11:09:54 ET JOB#: [id number]  cc: Shaune PollackQing Brendaliz Kuk, MD, <Dictator> Shaune PollackQING Dyani Babel MD ELECTRONICALLY SIGNED 08/07/2013 16:06

## 2014-09-09 NOTE — Consult Note (Signed)
PATIENT NAME:  Craig Reese, Craig Reese MR#:  [id number] DATE OF BIRTH:  16-May-1961  DATE OF CONSULTATION:  08/11/2013  REFERRING PHYSICIAN:  Enid Baasadhika Kalisetti, MD CONSULTING PHYSICIAN:  Stann Mainlandavid P. Sampson GoonFitzgerald, MD  REASON FOR CONSULTATION: Fevers and respiratory failure and bacteremia.   HISTORY OF PRESENT ILLNESS: This is a 54 year old gentleman with a history of CHF, chronic A-fib, and coronary artery disease who was admitted March 22nd with acute onset of shortness of breath. When he was admitted he was found to be in respiratory failure and required intubation. His white count was 17,000. It was felt he likely had pulmonary edema due to his CHF. The patient was initially started on Zosyn on admission due to his leukocytosis and his temperature of 100.7. However, he has continued to have fevers despite the Zosyn. His blood cultures are also growing gram-negative rods yet to be further identified. In fact his fever curve has been trending up. On the 23rd, his T-max was 101.1, on the 24th 101.8, on the 25th 101.4, and then over the last 24 hours 102.9. He is relatively hemodynamically stable, not on pressors but is having difficulty weaning off the vent. He has thick, than secretions from his ET tube per nursing. There is no evidence of rash or joint pain or swelling. There are no bed sores.   PAST MEDICAL HISTORY: 1.  CHF.  2.  A-fib. 3.  Coronary artery disease.  4.  Hypertension.  5.  Hyperlipidemia.   SOCIAL HISTORY: Unable to be obtained directly from the patient as he is intubated. However, per prior documents, he does not smoke, drink or use drugs. No family is present.   PAST SURGICAL HISTORY: Apparently he had stent placement.   ALLERGIES: No known drug allergies.   FAMILY HISTORY: Per records there is cardiac history in his family.  REVIEW OF SYSTEMS: Unable to be obtained as the patient is intubated.   ANTIBIOTICS SINCE ADMISSION: Include Zosyn begun 03/21 through 03/25 and vancomycin started  03/25.   PHYSICAL EXAMINATION: VITAL SIGNS: T-max over the last 24 hours 102.9, fever curve as discussed in his HPI. Pulse 132, blood pressure 102/46, respirations 20, sat 92% on 35% FiO2 on the ventilator.  GENERAL: He is intubated, sedated, has an ET tube in place and OG tube. He has a left neck central line. He has a Foley catheter in place.  HEENT: Pupils equal, round, and reactive to light and accommodation. Extraocular movements are intact. His sclerae are anicteric. His oropharynx has a ET tube in place.  NECK: Appears supple to passive flexion. There is no lymphadenopathy noted.  HEART: Tachy but regular.  LUNGS: Have rhonchi bilaterally.  ABDOMEN: Mildly distended. Does not appear tender. The patient does not grimace. Normal bowel sounds.  EXTREMITIES: He has trace edema bilaterally.  SKIN: There is no obvious rash, ecchymoses or petechiae.  MUSCULOSKELETAL: No obvious joint swelling, tenderness.  NEUROLOGIC: He is sedated.   DIAGNOSTIC DATA: Chest x-ray done March 26th shows low lung volumes, enlarged cardiac silhouette, NG tube in place, increased density within the left retrocardiac region.   White blood count on admission was 17.5; currently it is down to 11.7 yesterday. Hemoglobin 10.5, platelets 182,000. LFTs on admission were normal when last checked March 22nd. Renal function shows a creatinine of 1.33. TSH slightly low at 0.4. Urinalysis on admission showed 4 white cells.   Blood cultures March 22nd, on admission, are growing gram-negative rods in 2 of 2 bottles and on March 24th sputum culture is growing  no growth. Repeat blood cultures are pending from today.   IMPRESSION: A 54 year old with congestive heart failure, atrial fibrillation, coronary artery disease admitted with what appeared to be pulmonary edema and respiratory failure. He also had a temperature of 100.7 and white count 17. Blood cultures are growing gram-negative rods without a source. They have yet to be  further. He has been persistently febrile on Zosyn.   RECOMMENDATIONS: 1.  Repeat blood cultures, which are pending.  2.  Check HIV test, which I have ordered.  3.  We will change the Zosyn to meropenem as Zosyn can cause drug fever and we do not want to confuse the issue. I agree with adding vancomycin until this is further clear.  4.  I will discuss with the micro lab further. We may need to add other gram-negative coverage if he continues to be febrile.  5.  Consider CT of the chest and abdomen if fevers continue.  6.  His line appears clear and no evidence of an active line infection, but that is always a source especially with candidemia.  7.  I would also check a urinalysis and urine culture given that he has a Foley catheter in place.   Thank you for the consult. I will be glad to follow with you.   ____________________________ Stann Mainland. Sampson Goon, MD dpf:sb D: 08/11/2013 11:01:17 ET T: 08/11/2013 11:30:19 ET JOB#: [id number]  cc: Stann Mainland. Sampson Goon, MD, <Dictator> Panagiota Perfetti Sampson Goon MD ELECTRONICALLY SIGNED 08/28/2013 16:21

## 2017-06-10 DIAGNOSIS — Z85850 Personal history of malignant neoplasm of thyroid: Secondary | ICD-10-CM | POA: Insufficient documentation

## 2018-04-14 ENCOUNTER — Emergency Department
Admission: EM | Admit: 2018-04-14 | Discharge: 2018-04-14 | Disposition: A | Discharge status: Home / Self Care | Payer: Self-pay | Attending: Emergency Medicine | Admitting: Emergency Medicine

## 2018-04-14 ENCOUNTER — Other Ambulatory Visit: Payer: Self-pay

## 2018-04-14 DIAGNOSIS — Z7901 Long term (current) use of anticoagulants: Secondary | ICD-10-CM | POA: Insufficient documentation

## 2018-04-14 DIAGNOSIS — L918 Other hypertrophic disorders of the skin: Secondary | ICD-10-CM | POA: Insufficient documentation

## 2018-04-14 DIAGNOSIS — R58 Hemorrhage, not elsewhere classified: Secondary | ICD-10-CM

## 2018-04-14 MED ORDER — LIDOCAINE-EPINEPHRINE 2 %-1:100000 IJ SOLN
INTRAMUSCULAR | Status: AC
  Administered 2018-04-14: 19:00:00
  Filled 2018-04-14: qty 1

## 2018-04-14 MED ORDER — SILVER NITRATE-POT NITRATE 75-25 % EX MISC
CUTANEOUS | Status: AC
  Administered 2018-04-14: 19:00:00
  Filled 2018-04-14: qty 1

## 2018-04-14 NOTE — ED Triage Notes (Signed)
Pt comes from home after his arm has been bleeding for the past two days. He has a skin tag/wart that he "popped" and it has been bleeding since. Pt is on blood thinners for PE dx in June. Pt did not take them today. Pt has dressing saturated in blood that was changed an hour ago.

## 2018-04-14 NOTE — ED Provider Notes (Signed)
[facility] Emergency Department Provider Note       Time seen: ----------------------------------------- 5:49 PM on 04/14/2018 -----------------------------------------   I have reviewed the triage vital signs and the nursing notes.  HISTORY   Chief Complaint Wound Check    HPI Craig Reese is a 57 y.o. male with a history of coronary artery disease, diabetes who presents to the ED for bleeding from his left arm for the past 2 days.  Patient has a skin tag that has started bleeding and he has tried numerous things to get the bleeding to stop.  He is on blood thinners after he was diagnosed with a pulmonary embolus in June.  Patient did not take anticoagulants today.  Dressing was saturated with blood on arrival. History reviewed. No pertinent past medical history.  There are no active problems to display for this patient.  Allergies Patient has no known allergies.  Social History Social History   Tobacco Use  . Smoking status: Not on file  Substance Use Topics  . Alcohol use: Not on file  . Drug use: Not on file   Review of Systems Constitutional: Negative for fever. Cardiovascular: Negative for chest pain. Respiratory: Negative for shortness of breath. Gastrointestinal: Negative for abdominal pain, vomiting and diarrhea. Musculoskeletal: Positive for bleeding from a left upper arm skin tag Skin: Positive for bleeding skin tag Neurological: Negative for headaches, focal weakness or numbness.  All systems negative/normal/unremarkable except as stated in the HPI  ____________________________________________   PHYSICAL EXAM:  VITAL SIGNS: ED Triage Vitals  Enc Vitals Group     BP 04/14/18 1736 (!) 199/104     Pulse Rate 04/14/18 1736 (!) 108     Resp 04/14/18 1736 18     Temp 04/14/18 1736 98.4 F (36.9 C)     Temp Source 04/14/18 1736 Oral     SpO2 04/14/18 1736 98 %     Weight 04/14/18 1738 260 lb (117.9 kg)     Height 04/14/18  1738 5\' 10"  (1.778 m)     Head Circumference --      Peak Flow --      Pain Score 04/14/18 1736 5     Pain Loc --      Pain Edu? --      Excl. in GC? --    Constitutional: Alert and oriented. Well appearing and in no distress. Musculoskeletal: There is an approximately 1 cm skin tag over the triceps region of the left arm posteriorly,  oozing of bright blood is noted Neurologic:  Normal speech and language. No gross focal neurologic deficits are appreciated.  Skin: Oozing of blood from a skin tag is noted in the left tricep region posteriorly Psychiatric: Mood and affect are normal. Speech and behavior are normal.   ____________________________________________  ED COURSE:  As part of my medical decision making, I reviewed the following data within the electronic MEDICAL RECORD NUMBER History obtained from family if available, nursing notes, old chart and ekg, as well as notes from prior ED visits. Patient presented for bleeding from a skin tag on his left arm, we will assess with labs and imaging as indicated at this time.   Marland Kitchen..Incision and Drainage Date/Time: 04/14/2018 6:16 PM Performed by: Emily FilbertWilliams, Litsy Epting E, MD Authorized by: Emily FilbertWilliams, Prospero Mahnke E, MD   Consent:    Consent obtained:  Verbal and emergent situation   Consent given by:  Patient Location:    Size:  Left triceps skin tag   Location:  Upper extremity   Upper extremity location:  Arm   Arm location:  L upper arm Pre-procedure details:    Skin preparation:  Betadine Anesthesia (see MAR for exact dosages):    Anesthesia method:  Local infiltration   Local anesthetic:  Lidocaine 1% WITH epi Procedure type:    Complexity:  Simple Procedure details:    Incision types:  Single straight   Scalpel blade:  11 Post-procedure details:    Patient tolerance of procedure:  Tolerated well, no immediate complications Comments:     Skin tag was removed using a typical set up for an incision and drainage.  I then utilized 4-0  rapidly absorbing suture and applied in figure-of-eight stitch.  We did use some silver nitrate to further stop the bleeding.  Patient tolerated this well.    ____________________________________________  DIFFERENTIAL DIAGNOSIS   Skin tag, coagulopathy  FINAL ASSESSMENT AND PLAN  Skin tag removal, coagulopathy   Plan: The patient had presented for bleeding from a skin tag.  Patient tolerated this procedure well and there was complete hemostasis.  He is cleared for outpatient follow-up.   Ulice Dash, MD   Note: This note was generated in part or whole with voice recognition software. Voice recognition is usually quite accurate but there are transcription errors that can and very often do occur. I apologize for any typographical errors that were not detected and corrected.     Emily Filbert, MD 04/14/18 1900

## 2018-04-14 NOTE — ED Notes (Addendum)
Skin tag on left posterior upper arm with steady trickle of blood. Area repaired by md, skin tag removed and sent to lab for analysis. Currently covered with non stick bandaging and Tegaderm to observe for continued bleeding.

## 2018-04-14 NOTE — ED Notes (Signed)
Area currently not bleeding

## 2018-04-19 LAB — SURGICAL PATHOLOGY

## 2018-05-13 ENCOUNTER — Emergency Department: Payer: Self-pay

## 2018-05-13 ENCOUNTER — Encounter: Payer: Self-pay | Admitting: Medical Oncology

## 2018-05-13 ENCOUNTER — Inpatient Hospital Stay
Admission: EM | Admit: 2018-05-13 | Discharge: 2018-05-18 | DRG: 287 | Disposition: A | Discharge status: Home / Self Care | Payer: Self-pay | Attending: Internal Medicine | Admitting: Internal Medicine

## 2018-05-13 ENCOUNTER — Other Ambulatory Visit: Payer: Self-pay

## 2018-05-13 DIAGNOSIS — Z951 Presence of aortocoronary bypass graft: Secondary | ICD-10-CM

## 2018-05-13 DIAGNOSIS — Z7901 Long term (current) use of anticoagulants: Secondary | ICD-10-CM

## 2018-05-13 DIAGNOSIS — I25110 Atherosclerotic heart disease of native coronary artery with unstable angina pectoris: Principal | ICD-10-CM | POA: Diagnosis present

## 2018-05-13 DIAGNOSIS — I25119 Atherosclerotic heart disease of native coronary artery with unspecified angina pectoris: Secondary | ICD-10-CM

## 2018-05-13 DIAGNOSIS — Z86711 Personal history of pulmonary embolism: Secondary | ICD-10-CM

## 2018-05-13 DIAGNOSIS — Z7984 Long term (current) use of oral hypoglycemic drugs: Secondary | ICD-10-CM

## 2018-05-13 DIAGNOSIS — E785 Hyperlipidemia, unspecified: Secondary | ICD-10-CM | POA: Diagnosis present

## 2018-05-13 DIAGNOSIS — R51 Headache: Secondary | ICD-10-CM | POA: Diagnosis present

## 2018-05-13 DIAGNOSIS — Z8679 Personal history of other diseases of the circulatory system: Secondary | ICD-10-CM

## 2018-05-13 DIAGNOSIS — E1165 Type 2 diabetes mellitus with hyperglycemia: Secondary | ICD-10-CM | POA: Diagnosis present

## 2018-05-13 DIAGNOSIS — Z953 Presence of xenogenic heart valve: Secondary | ICD-10-CM

## 2018-05-13 DIAGNOSIS — I5022 Chronic systolic (congestive) heart failure: Secondary | ICD-10-CM | POA: Diagnosis present

## 2018-05-13 DIAGNOSIS — I2510 Atherosclerotic heart disease of native coronary artery without angina pectoris: Secondary | ICD-10-CM | POA: Diagnosis present

## 2018-05-13 DIAGNOSIS — Z79890 Hormone replacement therapy (postmenopausal): Secondary | ICD-10-CM

## 2018-05-13 DIAGNOSIS — R000 Tachycardia, unspecified: Secondary | ICD-10-CM | POA: Diagnosis present

## 2018-05-13 DIAGNOSIS — Z79899 Other long term (current) drug therapy: Secondary | ICD-10-CM

## 2018-05-13 DIAGNOSIS — R52 Pain, unspecified: Secondary | ICD-10-CM

## 2018-05-13 DIAGNOSIS — I110 Hypertensive heart disease with heart failure: Secondary | ICD-10-CM | POA: Diagnosis present

## 2018-05-13 DIAGNOSIS — Z86718 Personal history of other venous thrombosis and embolism: Secondary | ICD-10-CM

## 2018-05-13 DIAGNOSIS — M79671 Pain in right foot: Secondary | ICD-10-CM | POA: Diagnosis not present

## 2018-05-13 DIAGNOSIS — M25571 Pain in right ankle and joints of right foot: Secondary | ICD-10-CM

## 2018-05-13 DIAGNOSIS — M25551 Pain in right hip: Secondary | ICD-10-CM | POA: Diagnosis not present

## 2018-05-13 DIAGNOSIS — Z6837 Body mass index (BMI) 37.0-37.9, adult: Secondary | ICD-10-CM

## 2018-05-13 DIAGNOSIS — M25559 Pain in unspecified hip: Secondary | ICD-10-CM

## 2018-05-13 DIAGNOSIS — E876 Hypokalemia: Secondary | ICD-10-CM | POA: Diagnosis present

## 2018-05-13 DIAGNOSIS — R079 Chest pain, unspecified: Secondary | ICD-10-CM

## 2018-05-13 DIAGNOSIS — E890 Postprocedural hypothyroidism: Secondary | ICD-10-CM | POA: Diagnosis present

## 2018-05-13 DIAGNOSIS — Z227 Latent tuberculosis: Secondary | ICD-10-CM

## 2018-05-13 DIAGNOSIS — Z85850 Personal history of malignant neoplasm of thyroid: Secondary | ICD-10-CM

## 2018-05-13 DIAGNOSIS — I4891 Unspecified atrial fibrillation: Secondary | ICD-10-CM | POA: Diagnosis present

## 2018-05-13 DIAGNOSIS — R531 Weakness: Secondary | ICD-10-CM | POA: Diagnosis present

## 2018-05-13 HISTORY — DX: Atherosclerotic heart disease of native coronary artery without angina pectoris: I25.10

## 2018-05-13 LAB — COMPREHENSIVE METABOLIC PANEL
ALBUMIN: 4.2 g/dL (ref 3.5–5.0)
ALK PHOS: 117 U/L (ref 38–126)
ALT: 51 U/L — AB (ref 0–44)
AST: 47 U/L — AB (ref 15–41)
Anion gap: 12 (ref 5–15)
BUN: 11 mg/dL (ref 6–20)
CALCIUM: 8.5 mg/dL — AB (ref 8.9–10.3)
CHLORIDE: 95 mmol/L — AB (ref 98–111)
CO2: 27 mmol/L (ref 22–32)
CREATININE: 0.99 mg/dL (ref 0.61–1.24)
GFR calc Af Amer: >60 mL/min (ref >60)
GFR calc non Af Amer: >60 mL/min (ref >60)
GLUCOSE: 244 mg/dL — AB (ref 70–99)
Potassium: 3.2 mmol/L — ABNORMAL LOW (ref 3.5–5.1)
SODIUM: 134 mmol/L — AB (ref 135–145)
Total Bilirubin: 0.7 mg/dL (ref 0.3–1.2)
Total Protein: 8.2 g/dL — ABNORMAL HIGH (ref 6.5–8.1)

## 2018-05-13 LAB — APTT
aPTT: 33 seconds (ref 24–36)
aPTT: 44 seconds — ABNORMAL HIGH (ref 24–36)

## 2018-05-13 LAB — PROTIME-INR
INR: 1.08
Prothrombin Time: 13.9 seconds (ref 11.4–15.2)

## 2018-05-13 LAB — CBC
HCT: 41.3 % (ref 39.0–52.0)
HEMOGLOBIN: 13.7 g/dL (ref 13.0–17.0)
MCH: 28.2 pg (ref 26.0–34.0)
MCHC: 33.2 g/dL (ref 30.0–36.0)
MCV: 85 fL (ref 80.0–100.0)
PLATELETS: 303 10*3/uL (ref 150–400)
RBC: 4.86 MIL/uL (ref 4.22–5.81)
RDW: 13.3 % (ref 11.5–15.5)
WBC: 11.6 10*3/uL — AB (ref 4.0–10.5)
nRBC: 0 % (ref 0.0–0.2)

## 2018-05-13 LAB — HEPARIN LEVEL (UNFRACTIONATED): Heparin Unfractionated: <0.1 IU/mL — ABNORMAL LOW (ref 0.30–0.70)

## 2018-05-13 LAB — TROPONIN I
Troponin I: 0.05 ng/mL (ref <0.03)
Troponin I: 0.18 ng/mL (ref <0.03)
Troponin I: 0.22 ng/mL (ref <0.03)

## 2018-05-13 MED ORDER — SODIUM CHLORIDE 0.9 % IV BOLUS
1000.0000 mL | Freq: Once | INTRAVENOUS | Status: AC
  Administered 2018-05-13: 1000 mL via INTRAVENOUS

## 2018-05-13 MED ORDER — DILTIAZEM HCL 25 MG/5ML IV SOLN
10.0000 mg | Freq: Once | INTRAVENOUS | Status: AC
  Administered 2018-05-13: 10 mg via INTRAVENOUS
  Filled 2018-05-13: qty 5

## 2018-05-13 MED ORDER — GABAPENTIN 600 MG PO TABS
300.0000 mg | ORAL_TABLET | Freq: Two times a day (BID) | ORAL | Status: DC
  Administered 2018-05-13 – 2018-05-18 (×10): 300 mg via ORAL
  Filled 2018-05-13 (×10): qty 1

## 2018-05-13 MED ORDER — NITROGLYCERIN 0.4 MG SL SUBL
0.4000 mg | SUBLINGUAL_TABLET | SUBLINGUAL | Status: DC | PRN
  Administered 2018-05-13: 0.4 mg via SUBLINGUAL
  Filled 2018-05-13: qty 1

## 2018-05-13 MED ORDER — POTASSIUM CHLORIDE CRYS ER 20 MEQ PO TBCR
10.0000 meq | EXTENDED_RELEASE_TABLET | Freq: Every day | ORAL | Status: DC
  Administered 2018-05-13 – 2018-05-18 (×6): 10 meq via ORAL
  Filled 2018-05-13 (×6): qty 1

## 2018-05-13 MED ORDER — PANTOPRAZOLE SODIUM 40 MG PO TBEC
40.0000 mg | DELAYED_RELEASE_TABLET | Freq: Every day | ORAL | Status: DC
  Administered 2018-05-13 – 2018-05-18 (×6): 40 mg via ORAL
  Filled 2018-05-13 (×6): qty 1

## 2018-05-13 MED ORDER — METFORMIN HCL 500 MG PO TABS
500.0000 mg | ORAL_TABLET | Freq: Two times a day (BID) | ORAL | Status: DC
  Administered 2018-05-13: 500 mg via ORAL
  Filled 2018-05-13: qty 1

## 2018-05-13 MED ORDER — MORPHINE SULFATE (PF) 2 MG/ML IV SOLN: 2.0000 mg | Freq: Four times a day (QID) | INTRAVENOUS | Status: AC | PRN

## 2018-05-13 MED ORDER — ACETAMINOPHEN 325 MG PO TABS
650.0000 mg | ORAL_TABLET | Freq: Four times a day (QID) | ORAL | Status: DC | PRN
  Administered 2018-05-13 – 2018-05-14 (×3): 650 mg via ORAL
  Filled 2018-05-13 (×3): qty 2

## 2018-05-13 MED ORDER — LEVOTHYROXINE SODIUM 50 MCG PO TABS
150.0000 ug | ORAL_TABLET | ORAL | Status: DC
  Administered 2018-05-13 – 2018-05-18 (×6): 150 ug via ORAL
  Filled 2018-05-13 (×6): qty 1

## 2018-05-13 MED ORDER — METOPROLOL TARTRATE 50 MG PO TABS
50.0000 mg | ORAL_TABLET | Freq: Two times a day (BID) | ORAL | Status: DC
  Administered 2018-05-13 – 2018-05-18 (×10): 50 mg via ORAL
  Filled 2018-05-13 (×10): qty 1

## 2018-05-13 MED ORDER — FUROSEMIDE 40 MG PO TABS
40.0000 mg | ORAL_TABLET | Freq: Every day | ORAL | Status: DC
  Administered 2018-05-13 – 2018-05-18 (×6): 40 mg via ORAL
  Filled 2018-05-13 (×6): qty 1

## 2018-05-13 MED ORDER — NITROGLYCERIN 2 % TD OINT
0.5000 [in_us] | TOPICAL_OINTMENT | Freq: Four times a day (QID) | TRANSDERMAL | Status: DC
  Administered 2018-05-13 – 2018-05-15 (×8): 0.5 [in_us] via TOPICAL
  Filled 2018-05-13 (×9): qty 1

## 2018-05-13 MED ORDER — ACETAMINOPHEN 500 MG PO TABS
ORAL_TABLET | ORAL | Status: AC
  Administered 2018-05-13: 1000 mg
  Filled 2018-05-13: qty 2

## 2018-05-13 MED ORDER — ASPIRIN 81 MG PO CHEW
81.0000 mg | CHEWABLE_TABLET | Freq: Every day | ORAL | Status: DC
  Administered 2018-05-13 – 2018-05-18 (×5): 81 mg via ORAL
  Filled 2018-05-13 (×4): qty 1

## 2018-05-13 MED ORDER — ATORVASTATIN CALCIUM 20 MG PO TABS
40.0000 mg | ORAL_TABLET | Freq: Every evening | ORAL | Status: DC
  Administered 2018-05-13 – 2018-05-17 (×5): 40 mg via ORAL
  Filled 2018-05-13 (×5): qty 2

## 2018-05-13 MED ORDER — MORPHINE SULFATE (PF) 4 MG/ML IV SOLN
4.0000 mg | Freq: Once | INTRAVENOUS | Status: AC
  Administered 2018-05-13: 4 mg via INTRAVENOUS
  Filled 2018-05-13: qty 1

## 2018-05-13 MED ORDER — ONDANSETRON HCL 4 MG PO TABS: 4.0000 mg | ORAL_TABLET | Freq: Four times a day (QID) | ORAL | Status: DC | PRN

## 2018-05-13 MED ORDER — HEPARIN (PORCINE) 25000 UT/250ML-% IV SOLN
1950.0000 [IU]/h | INTRAVENOUS | Status: DC
  Administered 2018-05-13: 1350 [IU]/h via INTRAVENOUS
  Administered 2018-05-14: 1450 [IU]/h via INTRAVENOUS
  Administered 2018-05-15: 1600 [IU]/h via INTRAVENOUS
  Administered 2018-05-15 – 2018-05-17 (×3): 1800 [IU]/h via INTRAVENOUS
  Filled 2018-05-13 (×6): qty 250

## 2018-05-13 MED ORDER — HYDRALAZINE HCL 25 MG PO TABS
25.0000 mg | ORAL_TABLET | Freq: Three times a day (TID) | ORAL | Status: DC
  Administered 2018-05-13 – 2018-05-18 (×14): 25 mg via ORAL
  Filled 2018-05-13 (×14): qty 1

## 2018-05-13 MED ORDER — ONDANSETRON HCL 4 MG/2ML IJ SOLN: 4.0000 mg | Freq: Four times a day (QID) | INTRAMUSCULAR | Status: DC | PRN

## 2018-05-13 MED ORDER — BISACODYL 5 MG PO TBEC
5.0000 mg | DELAYED_RELEASE_TABLET | Freq: Every day | ORAL | Status: DC | PRN
  Filled 2018-05-13: qty 1

## 2018-05-13 MED ORDER — ACETAMINOPHEN 650 MG RE SUPP: 650.0000 mg | Freq: Four times a day (QID) | RECTAL | Status: DC | PRN

## 2018-05-13 MED ORDER — DOCUSATE SODIUM 100 MG PO CAPS
100.0000 mg | ORAL_CAPSULE | Freq: Two times a day (BID) | ORAL | Status: DC
  Administered 2018-05-13 – 2018-05-18 (×10): 100 mg via ORAL
  Filled 2018-05-13 (×10): qty 1

## 2018-05-13 NOTE — ED Notes (Signed)
patientv reports pain down to 5/10 with IV meds and 1 nitro. Nitro #2 given. Will monitor

## 2018-05-13 NOTE — ED Notes (Signed)
Floor nurse at bedside with house bed to pick her patient up and trannsfer to 260

## 2018-05-13 NOTE — ED Notes (Signed)
Patient reports feeling better. Vss. Awaiting bed status. CP 3/10

## 2018-05-13 NOTE — H&P (Signed)
[facility] [place] [facility]   PATIENT NAME: Craig PernaJose Reese    MR#:  [id number]  DATE OF BIRTH:  [birth year]  DATE OF ADMISSION:  05/13/2018  PRIMARY CARE PHYSICIAN: [facility]   REQUESTING/REFERRING PHYSICIAN: Dr. Minna AntisKevin Paduchowski  CHIEF COMPLAINT: Chest pain   Chief Complaint  Patient presents with  . Chest Pain    HISTORY OF PRESENT ILLNESS:  Craig PernaJose Reese  is a 57 y.o. male with a known history of CAD, CABG comes in because of central chest pain, he comes chest pressure 10 out of 10 in severity started this morning associated with diaphoresis, nausea relieved with nitroglycerin sublingual, patient also got morphine it helped him.  Complains of chest pain coming on and off.  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  Talk with Spanish interpreter.  Patient EKG showed atrial fibrillation with RVR 158 bpm, wide QRS complex on arrival, patient received 1 dose of Cardizem 10 mg and heart rate improved and now he is maintaining around 82 bpm and he is in sinus rhythm.  PAST MEDICAL HISTORY:   Past Medical History:  Diagnosis Date  . Coronary artery disease   Currently chest pain-free.  PAST SURGICAL HISTOIRY:    SOCIAL HISTORY:   Social History   Tobacco Use  . Smoking status: Not on file  Substance Use Topics  . Alcohol use: Not on file    FAMILY HISTORY:  No family history on file.  DRUG ALLERGIES:  No Known Allergies  REVIEW OF SYSTEMS:  CONSTITUTIONAL: No fever, fatigue or weakness.  EYES: No blurred or double vision.  EARS, NOSE, AND THROAT: No tinnitus or ear pain.  RESPIRATORY: No cough, shortness of breath, wheezing or hemoptysis.  CARDIOVASCULAR: Chest pressure since morning. GASTROINTESTINAL: No nausea, vomiting, diarrhea or abdominal pain.  GENITOURINARY: No dysuria, hematuria.  ENDOCRINE: No polyuria, nocturia,  HEMATOLOGY: No anemia, easy bruising or bleeding SKIN: No rash or  lesion. MUSCULOSKELETAL: No joint pain or arthritis.   NEUROLOGIC: No tingling, numbness, weakness.  PSYCHIATRY: No anxiety or depression.   MEDICATIONS AT HOME:   Prior to Admission medications   Medication Sig Start Date End Date Taking? Authorizing Provider  atorvastatin (LIPITOR) 40 MG tablet Take 1 tablet by mouth daily. 06/16/17 06/16/18 Yes [provider]  furosemide (LASIX) 40 MG tablet Take 1 tablet by mouth daily. 11/07/16 06/16/18 Yes [provider]  gabapentin (NEURONTIN) 300 MG capsule Take 1 capsule by mouth 2 (two) times daily. 10/07/17 12/23/18 Yes [provider]  hydrALAZINE (APRESOLINE) 25 MG tablet Take 1 tablet by mouth 3 (three) times daily. 12/23/17  Yes [provider]  levothyroxine (SYNTHROID) 150 MCG tablet Take 1 tablet by mouth daily. 06/16/17 12/23/18 Yes [provider]  metFORMIN (GLUCOPHAGE) 500 MG tablet Take 1 tablet by mouth 2 (two) times daily. 06/16/17 12/23/18 Yes [provider]  metoprolol tartrate (LOPRESSOR) 50 MG tablet Take 1 tablet by mouth 2 (two) times daily. 11/07/16 06/16/18 Yes [provider]  omeprazole (PRILOSEC) 20 MG capsule Take 2 capsules by mouth daily. 06/16/17 12/23/18 Yes [provider]  potassium chloride (K-DUR) 10 MEQ tablet Take 1 tablet by mouth daily. 06/16/17 06/16/18 Yes [provider]  rivaroxaban (XARELTO) 20 MG TABS tablet Take 1 tablet by mouth daily. 05/09/15 07/05/18 Yes [provider]      VITAL SIGNS:  Blood pressure (!) 147/93, pulse 81, temperature 98.3 F (36.8 C), temperature source Oral, resp. rate  20, height 5\' 10"  (1.778 m), weight 117 kg, SpO2 100 %.  PHYSICAL EXAMINATION:  GENERAL:  57 y.o.-year-old patient lying in the bed with no acute distress.  EYES: Pupils equal, round, reactive to light. No scleral icterus. Extraocular muscles intact.  HEENT: Head atraumatic, normocephalic. Oropharynx and nasopharynx clear.  NECK:  Supple,  no jugular venous distention. No thyroid enlargement, no tenderness.  LUNGS: Normal breath sounds bilaterally, no wheezing, rales,rhonchi or crepitation. No use of accessory muscles of respiration.  CARDIOVASCULAR: S1, S2 normal. No murmurs, rubs, or gallops.  ABDOMEN: Soft, nontender, nondistended. Bowel sounds present. No organomegaly or mass.  EXTREMITIES: No pedal edema, cyanosis, or clubbing.  NEUROLOGIC: Cranial nerves II through XII are intact. Muscle strength 5/5 in all extremities. Sensation intact. Gait not checked.  PSYCHIATRIC: The patient is alert and oriented x 3.  SKIN: No obvious rash, lesion, or ulcer.   LABORATORY PANEL:   CBC Recent Labs  Lab 05/13/18 1051  WBC 11.6*  HGB 13.7  HCT 41.3  PLT 303   ------------------------------------------------------------------------------------------------------------------  Chemistries  Recent Labs  Lab 05/13/18 1051  NA 134*  K 3.2*  CL 95*  CO2 27  GLUCOSE 244*  BUN 11  CREATININE 0.99  CALCIUM 8.5*  AST 47*  ALT 51*  ALKPHOS 117  BILITOT 0.7   ------------------------------------------------------------------------------------------------------------------  Cardiac Enzymes Recent Labs  Lab 05/13/18 1051  TROPONINI 0.05*   ------------------------------------------------------------------------------------------------------------------  RADIOLOGY:  Dg Chest Portable 1 View  Result Date: 05/13/2018 CLINICAL DATA:  Chest pain. EXAM: PORTABLE CHEST 1 VIEW COMPARISON:  Chest x-ray dated Sep 22, 2013. FINDINGS: Lordotic positioning. Mild cardiomegaly status post CABG. Normal pulmonary vascularity. Low lung volumes with mild bibasilar atelectasis. No focal consolidation, pleural effusion, or pneumothorax. No acute osseous abnormality. IMPRESSION: Cardiomegaly.  Low lung volumes and mild bibasilar atelectasis. Electronically Signed   By: Obie Dredge M.D.   On: 05/13/2018 11:04    EKG:   Orders placed or  performed during the hospital encounter of 05/13/18  . EKG 12-Lead  . EKG 12-Lead  . EKG 12-Lead  . EKG 12-Lead  EKG shows atrial fibrillation at 158 bpm on arrival now he is in normal sinus rhythm with 82 bpm.  IMPRESSION AND PLAN:   57 year old male patient with history of CAD, CABG, diabetes mellitus type 2 complains of midsternal chest pressure since morning associated with diaphoresis, found to have A. fib with RVR on arrival, improved with Cardizem IV push. Assessment and plan 1.  A. fib with RVR: Resolved with 1 dose of Cardizem 10 mg push, monitor on telemetry, consult cardiology, but on heparin drip, discontinue Xarelto in the hospital just to see if he needs cardiac cath again because of history of CAD, CABG and now comes has chest pain.    2.  Central chest pressure, in a patient with CAD, CABG, he says he had CABG 2015 and also has a bioprosthetic cardiac valve, history of atrial fibrillation status post cardioversion as per [facility] cardiology note 2 years ago.  Patient troponins are negative, KG did not show new changes, cycle troponins, consult cardiology to see if needs repeat cardiac cath.  Start on heparin drip.  Continue beta-blockers, statins, start aspirin, start nitro patch, see how he does.  #3. history of papillary thyroid carcinoma status post surgery in 2015.  And has postoperative hypothyroidism, continue thyroid supplements.  4 .chronic systolic heart failure EF 45 to 50% by echocardiogram as per cardiology note from [facility], repeat echo,. 5 /hypokalemia: Replace the  potassium.  Spoke with the help of Spanish interpreter Adela LankJacqueline. All the records are reviewed and case discussed with ED provider. Management plans discussed with the patient, family and they are in agreement.  CODE STATUS: Full code  TOTAL TIME TAKING CARE OF THIS PATIENT: 55 minutes.    Katha HammingSnehalatha Demetrie Borge M.D on 05/13/2018 at 2:04 PM  Between 7am to 6pm - Pager - [phone]  After 6pm go to  [email or web address] - password [email or web address] [facility]  [facility]  Office  [phone]  CC: Primary care physician; [facility]  Note: This dictation was prepared with Nurse, children'sDragon dictation along with smaller phrase technology. Any transcriptional errors that result from this process are unintentional.

## 2018-05-13 NOTE — Progress Notes (Signed)
Talked to Dr. Luberta MutterKonidena about patient's mid sternal chest pain 3/10. Patient took nitro in the ED which is giving him a head ache. Will also place nitro patch, order given for Morphine 2mg  IV for severe pain. RN will continue to monitor.

## 2018-05-13 NOTE — H&P (Signed)
[facility] [place] [facility]   PATIENT NAME: Craig Reese    MR#:  [id number]  DATE OF BIRTH:  1961/03/03  DATE OF ADMISSION:  05/13/2018  PRIMARY CARE PHYSICIAN: [facility]   REQUESTING/REFERRING PHYSICIAN: Dr. Minna AntisKevin Paduchowski  CHIEF COMPLAINT: Chest pain   Chief Complaint  Patient presents with  . Chest Pain    HISTORY OF PRESENT ILLNESS:  Craig Reese  is a 57 y.o. male with a known history of CAD, CABG comes in because of central chest pain, he comes chest pressure 10 out of 10 in severity started this morning associated with diaphoresis, nausea relieved with nitroglycerin sublingual, patient also got morphine it helped him.  Complains of chest pain coming on and off.  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  Talk with Spanish interpreter.  Patient EKG showed atrial fibrillation with RVR 158 bpm, wide QRS complex on arrival, patient received 1 dose of Cardizem 10 mg and heart rate improved and now he is maintaining around 82 bpm and he is in sinus rhythm.  PAST MEDICAL HISTORY:   Past Medical History:  Diagnosis Date  . Coronary artery disease   Currently chest pain-free.  PAST SURGICAL HISTOIRY:    SOCIAL HISTORY:   Social History   Tobacco Use  . Smoking status: Not on file  Substance Use Topics  . Alcohol use: Not on file    FAMILY HISTORY:  No family history on file.  DRUG ALLERGIES:  No Known Allergies  REVIEW OF SYSTEMS:  CONSTITUTIONAL: No fever, fatigue or weakness.  EYES: No blurred or double vision.  EARS, NOSE, AND THROAT: No tinnitus or ear pain.  RESPIRATORY: No cough, shortness of breath, wheezing or hemoptysis.  CARDIOVASCULAR: Chest pressure since morning. GASTROINTESTINAL: No nausea, vomiting, diarrhea or abdominal pain.  GENITOURINARY: No dysuria, hematuria.  ENDOCRINE: No polyuria, nocturia,  HEMATOLOGY: No anemia, easy bruising or bleeding SKIN: No rash or  lesion. MUSCULOSKELETAL: No joint pain or arthritis.   NEUROLOGIC: No tingling, numbness, weakness.  PSYCHIATRY: No anxiety or depression.   MEDICATIONS AT HOME:   Prior to Admission medications   Medication Sig Start Date End Date Taking? Authorizing Provider  atorvastatin (LIPITOR) 40 MG tablet Take 1 tablet by mouth daily. 06/16/17 06/16/18 Yes [provider]  furosemide (LASIX) 40 MG tablet Take 1 tablet by mouth daily. 11/07/16 06/16/18 Yes [provider]  gabapentin (NEURONTIN) 300 MG capsule Take 1 capsule by mouth 2 (two) times daily. 10/07/17 12/23/18 Yes [provider]  hydrALAZINE (APRESOLINE) 25 MG tablet Take 1 tablet by mouth 3 (three) times daily. 12/23/17  Yes [provider]  levothyroxine (SYNTHROID) 150 MCG tablet Take 1 tablet by mouth daily. 06/16/17 12/23/18 Yes [provider]  metFORMIN (GLUCOPHAGE) 500 MG tablet Take 1 tablet by mouth 2 (two) times daily. 06/16/17 12/23/18 Yes [provider]  metoprolol tartrate (LOPRESSOR) 50 MG tablet Take 1 tablet by mouth 2 (two) times daily. 11/07/16 06/16/18 Yes [provider]  omeprazole (PRILOSEC) 20 MG capsule Take 2 capsules by mouth daily. 06/16/17 12/23/18 Yes [provider]  potassium chloride (K-DUR) 10 MEQ tablet Take 1 tablet by mouth daily. 06/16/17 06/16/18 Yes [provider]  rivaroxaban (XARELTO) 20 MG TABS tablet Take 1 tablet by mouth daily. 05/09/15 07/05/18 Yes [provider]      VITAL SIGNS:  Blood pressure (!) 89/68, pulse 80, temperature 98.3 F (36.8 C), temperature source Oral, resp. rate  12, height 5\' 10"  (1.778 m), weight 117 kg, SpO2 94 %.  PHYSICAL EXAMINATION:  GENERAL:  57 y.o.-year-old patient lying in the bed with no acute distress.  EYES: Pupils equal, round, reactive to light. No scleral icterus. Extraocular muscles intact.  HEENT: Head atraumatic, normocephalic. Oropharynx and nasopharynx clear.  NECK:  Supple, no  jugular venous distention. No thyroid enlargement, no tenderness.  LUNGS: Normal breath sounds bilaterally, no wheezing, rales,rhonchi or crepitation. No use of accessory muscles of respiration.  CARDIOVASCULAR: S1, S2 normal. No murmurs, rubs, or gallops.  ABDOMEN: Soft, nontender, nondistended. Bowel sounds present. No organomegaly or mass.  EXTREMITIES: No pedal edema, cyanosis, or clubbing.  NEUROLOGIC: Cranial nerves II through XII are intact. Muscle strength 5/5 in all extremities. Sensation intact. Gait not checked.  PSYCHIATRIC: The patient is alert and oriented x 3.  SKIN: No obvious rash, lesion, or ulcer.   LABORATORY PANEL:   CBC Recent Labs  Lab 05/13/18 1051  WBC 11.6*  HGB 13.7  HCT 41.3  PLT 303   ------------------------------------------------------------------------------------------------------------------  Chemistries  Recent Labs  Lab 05/13/18 1051  NA 134*  K 3.2*  CL 95*  CO2 27  GLUCOSE 244*  BUN 11  CREATININE 0.99  CALCIUM 8.5*  AST 47*  ALT 51*  ALKPHOS 117  BILITOT 0.7   ------------------------------------------------------------------------------------------------------------------  Cardiac Enzymes Recent Labs  Lab 05/13/18 1051  TROPONINI 0.05*   ------------------------------------------------------------------------------------------------------------------  RADIOLOGY:  Dg Chest Portable 1 View  Result Date: 05/13/2018 CLINICAL DATA:  Chest pain. EXAM: PORTABLE CHEST 1 VIEW COMPARISON:  Chest x-ray dated Sep 22, 2013. FINDINGS: Lordotic positioning. Mild cardiomegaly status post CABG. Normal pulmonary vascularity. Low lung volumes with mild bibasilar atelectasis. No focal consolidation, pleural effusion, or pneumothorax. No acute osseous abnormality. IMPRESSION: Cardiomegaly.  Low lung volumes and mild bibasilar atelectasis. Electronically Signed   By: Obie Dredge M.D.   On: 05/13/2018 11:04    EKG:   Orders placed or  performed during the hospital encounter of 05/13/18  . EKG 12-Lead  . EKG 12-Lead  . EKG 12-Lead  . EKG 12-Lead  EKG shows atrial fibrillation at 158 bpm on arrival now he is in normal sinus rhythm with 82 bpm.  IMPRESSION AND PLAN:   57 year old male patient with history of CAD, CABG, diabetes mellitus type 2 complains of midsternal chest pressure since morning associated with diaphoresis, found to have A. fib with RVR on arrival, improved with Cardizem IV push. Assessment and plan 1.  A. fib with RVR: Resolved with 1 dose of Cardizem 10 mg push, monitor on telemetry, consult cardiology, continue Xarelto, continue metoprolol tartrate 50 mg p.o. twice daily that he is on. 2.  Central chest pressure, in a patient with CAD, CABG, he says he had CABG 2015 and also has a bioprosthetic cardiac valve, history of atrial fibrillation status post cardioversion as per [facility] cardiology note 2 years ago.  Patient troponins are negative, cycle troponins, consult cardiology to see if needs repeat cardiac cath.  Start on heparin drip.  Continue beta-blockers, statins, start aspirin, start nitro patch, see how he does.  #3. history of papillary thyroid carcinoma status post surgery in 2015.  And has postoperative hypothyroidism, continue thyroid supplements.  4 .chronic systolic heart failure EF 45 to 50% by echocardiogram as per cardiology note from [facility], repeat echo,. All the records are reviewed and case discussed with ED provider. Management plans discussed with the patient, family and they are in agreement.  CODE STATUS: Full code  TOTAL TIME TAKING CARE OF THIS PATIENT: 55 minutes.    Katha HammingSnehalatha Folashade Gamboa M.D on 05/13/2018 at 1:47 PM  Between 7am to 6pm - Pager - [phone]  After 6pm go to [email or web address] - password [email or web address] [facility]  [facility]  Office  [phone]  CC: Primary care physician; [facility]  Note: This dictation was prepared with Nurse, children'sDragon  dictation along with smaller phrase technology. Any transcriptional errors that result from this process are unintentional.

## 2018-05-13 NOTE — ED Provider Notes (Signed)
[facility] Emergency Department Provider Note  Time seen: 11:01 AM  I have reviewed the triage vital signs and the nursing notes.   HISTORY  Chief Complaint Chest Pain    HPI Craig Reese is a 57 y.o. male with a past medical history of CAD, CABG, hypothyroidism, hypertension, diabetes, taking Xarelto presents to the emergency department for chest pain.  According to the patient since this morning around 7:00 he developed severe 10/10 central sharp chest pain along with a rapid heartbeat and mild shortness of breath.  Patient denies any trouble breathing at this time denies any significant cough of the daughter states a mild cough.  Denies any fever.  Patient appears to be in atrial fibrillation at this time, but denies any history of the same.  Spanish interpreter used for this evaluation.    Past Medical History:  Diagnosis Date  . Coronary artery disease     There are no active problems to display for this patient.   No Known Allergies   Current Outpatient Prescriptions  Medication Sig Dispense Refill  . levothyroxine (SYNTHROID, LEVOTHROID) 150 MCG tablet Take 150 mcg by mouth daily at 0600.  Marland Kitchen. atorvastatin (LIPITOR) 40 MG tablet Take 1 tablet (40 mg total) by mouth every evening. 30 tablet 11  . furosemide (LASIX) 40 MG tablet Take 1 tablet (40 mg total) by mouth daily. 30 tablet 11  . hydrALAZINE (APRESOLINE) 25 MG tablet Take 1 tablet (25 mg total) by mouth every eight (8) hours. 90 tablet 11  . liothyronine (CYTOMEL) 25 MCG tablet Take 1 tablet (25 mcg total) by mouth daily. 30 tablet 0  . metFORMIN (GLUCOPHAGE) 500 MG tablet Take 1 tablet (500 mg total) by mouth 2 (two) times a day with meals. 180 tablet 3  . metoprolol tartrate (LOPRESSOR) 50 MG tablet Take 1 tablet (50 mg total) by mouth Two (2) times a day. 60 tablet 11  . rivaroxaban (XARELTO) 20 mg tablet Take 1 tablet (20 mg total) by mouth daily with evening meal. 30 tablet 11   No  current facility-administered medications for this visit.   Past Medical History  Diagnosis Date  . Myocardial infarction (RAF-HCC)  history of  . History of atrial fibrillation  . Hypertension 01/12/2013  s/p cardioversion  . Papillary thyroid carcinoma (RAF-HCC) 10/02/2013  . Hyperlipidemia 01/12/2013  . 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 Aortic valve endocarditis 11/01/2013  . History of aortic valve replacement with bioprosthetic valve  Freestyle  . History of CHF (congestive heart failure)  Currently stable >55% EF up from 50%   Past Surgical History  Procedure Laterality Date  . Pr replace aort valv,prosth valv N/A 10/12/2013  Procedure: REPLACEMENT, AORTIC VALVE, W/CARDIOPULMO BYPASS; W/PROSTHETIC VALVE OTHER THAN HOMOGRAFT OR STENTLESS VALVE; Surgeon: Cain Sievehomas Caranasos, MD; [facility]; Service: Cardiothoracic  . Pr cabg, arterial, single N/A 10/12/2013  Procedure: CORONARY ARTERY BYPASS, USING ARTERIAL GRAFT(S); SINGLE ARTERIAL GRAFT; Surgeon: Cain Sievehomas Caranasos, MD; [facility]; Service: Cardiothoracic  . Pr removal nodes, neck,cerv mod rad N/A 12/26/2013  Procedure: CERVICAL LYMPHADENECTOMY (MODIFIED RADICAL NECK DISSECTION); Surgeon: Bethel BornSamip N Patel, MD; [facility]; Service: ENT  . Pr thyroidectomy,malig,ltd neck surg N/A 12/26/2013  Procedure: THYROIDECTOMY, TOTAL OR SUBTOTAL FOR MALIGNANCY; WITH LIMITED NECK DISSECTION; Surgeon: Bethel BornSamip N Patel, MD; [facility]; Service: ENT  .  Dental extraction 10/07/13    Prior to Admission medications   Not on File    No Known Allergies  No family history on file.  Social History Social History   Tobacco Use  . Smoking status: Not on file  Substance Use Topics  . Alcohol use: Not on file  . Drug use: Not on file    Review of Systems Constitutional:  Negative for fever. Cardiovascular: 10/10 chest pain Respiratory: Negative for shortness of breath. Gastrointestinal: Negative for abdominal pain, vomiting  Musculoskeletal: Negative for leg pain Skin: Negative for skin complaints  Neurological: Negative for headache All other ROS negative  ____________________________________________   PHYSICAL EXAM:  VITAL SIGNS: ED Triage Vitals  Enc Vitals Group     BP 05/13/18 1035 (!) 135/107     Pulse Rate 05/13/18 1035 74     Resp 05/13/18 1035 (!) 24     Temp 05/13/18 1037 97.9 F (36.6 C)     Temp Source 05/13/18 1037 Oral     SpO2 05/13/18 1035 99 %     Weight 05/13/18 1035 257 lb 15 oz (117 kg)     Height 05/13/18 1035 5\' 10"  (1.778 m)     Head Circumference --      Peak Flow --      Pain Score 05/13/18 1035 10     Pain Loc --      Pain Edu? --      Excl. in GC? --    Constitutional: Alert and oriented. Well appearing and in no distress. Eyes: Normal exam ENT   Head: Normocephalic and atraumatic.   Mouth/Throat: Mucous membranes are moist. Cardiovascular: Normal rate, regular rhythm.  Old chest scar present. Respiratory: Normal respiratory effort without tachypnea nor retractions. Breath sounds are clear Gastrointestinal: Soft and nontender. No distention.   Musculoskeletal: Nontender with normal range of motion in all extremities. Neurologic:  Normal speech and language. No gross focal neurologic deficits Skin:  Skin is warm, dry and intact.  Psychiatric: Mood and affect are normal.   ____________________________________________    EKG  EKG viewed and interpreted by myself shows atrial fibrillation with rapid ventricular response at 158 bpm with a widened QRS, normal axis, slight QTC prolongation nonspecific ST changes.  ____________________________________________    RADIOLOGY  Chest x-ray shows cardiomegaly.  ____________________________________________   INITIAL IMPRESSION / ASSESSMENT AND PLAN / ED  COURSE  Pertinent labs & imaging results that were available during my care of the patient were reviewed by me and considered in my medical decision making (see chart for details).  Patient presents emergency department for chest pain found to be in atrial fibrillation with rapid ventricular response.  Describes 10 out of 10 chest pain.  History of a CABG greater than 10 years ago.  We will dose pain medication, nitroglycerin, diltiazem, IV fluids and continue to closely monitor while awaiting lab results.  Labs have resulted showing a troponin 0.05.  Patient's chest pain is down to a 3 or 4/10 with nitroglycerin but now has a moderate headache.  Patient will be admitted to the hospitalist service for ongoing care.   CRITICAL CARE Performed by: Minna Antis   Total critical care time: 30 minutes  Critical care time was exclusive of separately billable procedures and treating other patients.  Critical care was necessary to treat or prevent imminent or life-threatening deterioration.  Critical care was time spent personally by me on the following activities: development of treatment plan with patient and/or surrogate as well  as nursing, discussions with consultants, evaluation of patient's response to treatment, examination of patient, obtaining history from patient or surrogate, ordering and performing treatments and interventions, ordering and review of laboratory studies, ordering and review of radiographic studies, pulse oximetry and re-evaluation of patient's condition.  ____________________________________________   FINAL CLINICAL IMPRESSION(S) / ED DIAGNOSES  New onset atrial fibrillation with rapid ventricular response Chest pain   Minna AntisPaduchowski, Neftali Abair, MD 05/13/18 1223

## 2018-05-13 NOTE — ED Notes (Signed)
reporty given to receiving nurse Lynwood Dawleyasha Rn, will be down to pick patient up and transfer to unit.

## 2018-05-13 NOTE — Consult Note (Signed)
ANTICOAGULATION CONSULT NOTE  Pharmacy Consult for heparin drip management Indication: chest pain/ACS  No Known Allergies  Patient Measurements: Height: 5\' 10"  (177.8 cm) Weight: 257 lb 15 oz (117 kg) IBW/kg (Calculated) : 73 Heparin Dosing Weight: 99kg  Vital Signs: Temp: 98.3 F (36.8 C) (12/26 1213) Temp Source: Oral (12/26 1213) BP: 147/93 (12/26 1330) Pulse Rate: 81 (12/26 1330)  Labs: Recent Labs    05/13/18 1051  HGB 13.7  HCT 41.3  PLT 303  CREATININE 0.99  TROPONINI 0.05*    Estimated Creatinine Clearance: 108 mL/min (by C-G formula based on SCr of 0.99 mg/dL).   Medical History: Past Medical History:  Diagnosis Date  . Coronary artery disease    Assessment: 57 y.o. male with a past medical history of CAD, CABG, hypothyroidism, hypertension, diabetes, taking Xarelto presents to the emergency department for chest pain. His last dose of Xarelto was 12/25 around 2100 per the patient as told through an interpreter. Initial troponin is elevated. Baseline labs have been ordered.  Goal of Therapy:  Heparin level 0.3-0.7 units/ml APTT 66-102s Monitor platelets by anticoagulation protocol: Yes   Plan:  Start heparin infusion at 1350 units/hr Bolus is being withheld due to the proximity of last Xarelto dose Check aPTT level in 6 hours and anti-Xa daily while on heparin. APTT will be used to guide dosing until aPTT and ant-Xa correlate. Continue to monitor H&H and platelets  Lowella Bandyodney D Staphanie Harbison, PharmD 05/13/2018,2:03 PM

## 2018-05-13 NOTE — ED Notes (Signed)
ED TO INPATIENT HANDOFF REPORT  Name/Age/Gender Craig Reese 57 y.o. male  Code Status    Code Status Orders  (From admission, onward)         Start     Ordered   05/13/18 1403  Full code  Continuous     05/13/18 1408        Code Status History    This patient has a current code status but no historical code status.      Home/SNF/Other Patient from home  Chief Complaint chest pain  Level of Care/Admitting Diagnosis ED Disposition    ED Disposition Condition Comment   Admit  Hospital Area: [facility] [[id number]]  Level of Care: Telemetry [5]  Diagnosis: Chest pain due to CAD [facility]) [[id number]]  Admitting Physician: Katha Hamming [[id number]]  Attending Physician: Katha Hamming [phone]  Estimated length of stay: past midnight tomorrow  Certification:: I certify this patient will need inpatient services for at least 2 midnights  PT Class (Do Not Modify): Inpatient [101]  PT Acc Code (Do Not Modify): Private [1]       Medical History Past Medical History:  Diagnosis Date  . Coronary artery disease     Allergies No Known Allergies  IV Location/Drains/Wounds Patient Lines/Drains/Airways Status   Active Line/Drains/Airways    Name:   Placement date:   Placement time:   Site:   Days:   Peripheral IV 05/13/18 Left Antecubital   05/13/18    -    Antecubital   less than 1          Labs/Imaging Results for orders placed or performed during the hospital encounter of 05/13/18 (from the past 48 hour(s))  CBC     Status: Abnormal   Collection Time: 05/13/18 10:51 AM  Result Value Ref Range   WBC 11.6 (H) 4.0 - 10.5 K/uL   RBC 4.86 4.22 - 5.81 MIL/uL   Hemoglobin 13.7 13.0 - 17.0 g/dL   HCT 82.9 56.2 - 13.0 %   MCV 85.0 80.0 - 100.0 fL   MCH 28.2 26.0 - 34.0 pg   MCHC 33.2 30.0 - 36.0 g/dL   RDW 86.5 78.4 - 69.6 %   Platelets 303 150 - 400 K/uL   nRBC 0.0 0.0 - 0.2 %    Comment: Performed at [facility], [address] [place], [place] [address]  Comprehensive metabolic panel     Status: Abnormal   Collection Time: 05/13/18 10:51 AM  Result Value Ref Range   Sodium 134 (L) 135 - 145 mmol/L   Potassium 3.2 (L) 3.5 - 5.1 mmol/L   Chloride 95 (L) 98 - 111 mmol/L   CO2 27 22 - 32 mmol/L   Glucose, Bld 244 (H) 70 - 99 mg/dL   BUN 11 6 - 20 mg/dL   Creatinine, Ser 4.13 0.61 - 1.24 mg/dL   Calcium 8.5 (L) 8.9 - 10.3 mg/dL   Total Protein 8.2 (H) 6.5 - 8.1 g/dL   Albumin 4.2 3.5 - 5.0 g/dL   AST 47 (H) 15 - 41 U/L   ALT 51 (H) 0 - 44 U/L   Alkaline Phosphatase 117 38 - 126 U/L   Total Bilirubin 0.7 0.3 - 1.2 mg/dL   GFR calc non Af Amer >60 >60 mL/min   GFR calc Af Amer >60 >60 mL/min   Anion gap 12 5 - 15    Comment: Performed at [facility], [address] [place], [place] [address]  Troponin  I - ONCE - STAT     Status: Abnormal   Collection Time: 05/13/18 10:51 AM  Result Value Ref Range   Troponin I 0.05 (HH) <0.03 ng/mL    Comment: CRITICAL RESULT CALLED TO, READ BACK BY AND VERIFIED WITH  HEATHER FISHER AT 1124 05/13/18 SDR Performed at [facility], [address] [place], [place] [address]    Dg Chest Portable 1 View  Result Date: 05/13/2018 CLINICAL DATA:  Chest pain. EXAM: PORTABLE CHEST 1 VIEW COMPARISON:  Chest x-ray dated Sep 22, 2013. FINDINGS: Lordotic positioning. Mild cardiomegaly status post CABG. Normal pulmonary vascularity. Low lung volumes with mild bibasilar atelectasis. No focal consolidation, pleural effusion, or pneumothorax. No acute osseous abnormality. IMPRESSION: Cardiomegaly.  Low lung volumes and mild bibasilar atelectasis. Electronically Signed   By: Obie DredgeWilliam T Derry M.D.   On: 05/13/2018 11:04    Pending Labs Unresulted Labs (From admission, onward)    Start     Ordered   05/14/18 0500  Basic metabolic panel  Tomorrow morning,   STAT     05/13/18 1408   05/14/18 0500  CBC  Tomorrow morning,   STAT     05/13/18 1408   05/14/18  0500  Heparin level (unfractionated)  Tomorrow morning,   STAT     05/13/18 1412   05/13/18 2200  APTT  Once-Timed,   STAT     05/13/18 1412   05/13/18 1404  Troponin I - Now Then Q6H  Now then every 6 hours,   STAT     05/13/18 1408   05/13/18 1402  HIV antibody (Routine Testing)  Once,   STAT     05/13/18 1408   05/13/18 1402  Protime-INR  ONCE - STAT,   STAT     05/13/18 1412   05/13/18 1401  APTT  ONCE - STAT,   STAT     05/13/18 1412   05/13/18 1401  Heparin level (unfractionated)  ONCE - STAT,   STAT     05/13/18 1412          Vitals/Pain Today's Vitals   05/13/18 1430 05/13/18 1445 05/13/18 1500 05/13/18 1515  BP: 138/88  124/84   Pulse: 78 80 78 81  Resp: 16 13 13 11   Temp:      TempSrc:      SpO2: 96% 99% 99% 97%  Weight:      Height:      PainSc:        Isolation Precautions No active isolations  Medications Medications  nitroGLYCERIN (NITROSTAT) SL tablet 0.4 mg (0.4 mg Sublingual Given 05/13/18 1116)  atorvastatin (LIPITOR) tablet 40 mg (has no administration in time range)  furosemide (LASIX) tablet 40 mg (has no administration in time range)  hydrALAZINE (APRESOLINE) tablet 25 mg (has no administration in time range)  metoprolol tartrate (LOPRESSOR) tablet 50 mg (50 mg Oral Given 05/13/18 1509)  levothyroxine (SYNTHROID, LEVOTHROID) tablet 150 mcg (has no administration in time range)  metFORMIN (GLUCOPHAGE) tablet 500 mg (has no administration in time range)  pantoprazole (PROTONIX) EC tablet 40 mg (has no administration in time range)  potassium chloride SA (K-DUR,KLOR-CON) CR tablet 10 mEq (has no administration in time range)  gabapentin (NEURONTIN) tablet 300 mg (has no administration in time range)  acetaminophen (TYLENOL) tablet 650 mg (has no administration in time range)    Or  acetaminophen (TYLENOL) suppository 650 mg (has no administration in time range)  docusate sodium (COLACE) capsule 100 mg (has no administration in  time range)   bisacodyl (DULCOLAX) EC tablet 5 mg (has no administration in time range)  ondansetron (ZOFRAN) tablet 4 mg (has no administration in time range)    Or  ondansetron (ZOFRAN) injection 4 mg (has no administration in time range)  nitroGLYCERIN (NITROGLYN) 2 % ointment 0.5 inch (0.5 inches Topical Given 05/13/18 1510)  aspirin chewable tablet 81 mg (81 mg Oral Given 05/13/18 1518)  heparin ADULT infusion 100 units/mL (25000 units/23650mL sodium chloride 0.45%) (1,350 Units/hr Intravenous New Bag/Given 05/13/18 1512)  sodium chloride 0.9 % bolus 1,000 mL (0 mLs Intravenous Stopped 05/13/18 1358)  diltiazem (CARDIZEM) injection 10 mg (10 mg Intravenous Given 05/13/18 1203)  morphine 4 MG/ML injection 4 mg (4 mg Intravenous Given 05/13/18 1116)  acetaminophen (TYLENOL) 500 MG tablet (1,000 mg  Given 05/13/18 1221)    Mobility ambulaates at home

## 2018-05-13 NOTE — ED Notes (Signed)
pharmacy called on 2 occasions to clarify why scheduled meds on mar are not correlating with meds in pixis. pixis does not allow me to take out 1400 scheduled meds. It was explained to the pharmacy tech that when I try to pull a medication from the pixis it brings up  a hard stop and states this meds are not due until 12/27 1000 and 2200. I explained to the pharmacist that heparin drip is scheduled for 1400 and it does not even come up at all. As per pharmacy tech I was instructed to override this drip and any other due medication that is showing grey on the monitor.

## 2018-05-13 NOTE — ED Triage Notes (Signed)
Pt here with niece who reports that pt began having central chest pain around 0700 this am. Pt arrived diaphoretic. Reports that this feels similar to MI in past.

## 2018-05-13 NOTE — Consult Note (Signed)
ANTICOAGULATION CONSULT NOTE  Pharmacy Consult for heparin drip management Indication: chest pain/ACS  No Known Allergies  Patient Measurements: Height: 5\' 10"  (177.8 cm) Weight: 178 lb 3.2 oz (80.8 kg) IBW/kg (Calculated) : 73 Heparin Dosing Weight: 99kg  Vital Signs: Temp: 98.3 F (36.8 C) (12/26 1931) Temp Source: Oral (12/26 1931) BP: 124/77 (12/26 1931) Pulse Rate: 76 (12/26 1931)  Labs: Recent Labs    05/13/18 1051 05/13/18 1656 05/13/18 2008 05/13/18 2146  HGB 13.7  --   --   --   HCT 41.3  --   --   --   PLT 303  --   --   --   APTT  --  33  --  44*  LABPROT  --  13.9  --   --   INR  --  1.08  --   --   HEPARINUNFRC  --  <0.10*  --   --   CREATININE 0.99  --   --   --   TROPONINI 0.05* 0.18* 0.22*  --     Estimated Creatinine Clearance: 87.1 mL/min (by C-G formula based on SCr of 0.99 mg/dL).   Medical History: Past Medical History:  Diagnosis Date  . Coronary artery disease    Assessment: 57 y.o. male with a past medical history of CAD, CABG, hypothyroidism, hypertension, diabetes, taking Xarelto presents to the emergency department for chest pain. His last dose of Xarelto was 12/25 around 2100 per the patient as told through an interpreter. Initial troponin is elevated. Baseline labs have been ordered.  Goal of Therapy:  Heparin level 0.3-0.7 units/ml APTT 66-102s Monitor platelets by anticoagulation protocol: Yes   Plan:  Start heparin infusion at 1350 units/hr Bolus is being withheld due to the proximity of last Xarelto dose Check aPTT level in 6 hours and anti-Xa daily while on heparin. APTT will be used to guide dosing until aPTT and ant-Xa correlate. Continue to monitor H&H and platelets  12/26 PM aPTT 44. Increase to 1450 units/hr and recheck in 6 hours.  Craig Reese S, PharmD 05/13/2018,11:47 PM

## 2018-05-14 LAB — APTT: aPTT: 58 seconds — ABNORMAL HIGH (ref 24–36)

## 2018-05-14 LAB — BASIC METABOLIC PANEL
Anion gap: 11 (ref 5–15)
BUN: 17 mg/dL (ref 6–20)
CO2: 25 mmol/L (ref 22–32)
CREATININE: 0.97 mg/dL (ref 0.61–1.24)
Calcium: 7.7 mg/dL — ABNORMAL LOW (ref 8.9–10.3)
Chloride: 99 mmol/L (ref 98–111)
GFR calc Af Amer: >60 mL/min (ref >60)
GFR calc non Af Amer: >60 mL/min (ref >60)
GLUCOSE: 219 mg/dL — AB (ref 70–99)
Potassium: 3.2 mmol/L — ABNORMAL LOW (ref 3.5–5.1)
Sodium: 135 mmol/L (ref 135–145)

## 2018-05-14 LAB — CBC
HCT: 34.4 % — ABNORMAL LOW (ref 39.0–52.0)
Hemoglobin: 11.2 g/dL — ABNORMAL LOW (ref 13.0–17.0)
MCH: 27.9 pg (ref 26.0–34.0)
MCHC: 32.6 g/dL (ref 30.0–36.0)
MCV: 85.8 fL (ref 80.0–100.0)
Platelets: 259 10*3/uL (ref 150–400)
RBC: 4.01 MIL/uL — ABNORMAL LOW (ref 4.22–5.81)
RDW: 13.5 % (ref 11.5–15.5)
WBC: 9.4 10*3/uL (ref 4.0–10.5)
nRBC: 0 % (ref 0.0–0.2)

## 2018-05-14 LAB — HEPARIN LEVEL (UNFRACTIONATED)
Heparin Unfractionated: 0.28 IU/mL — ABNORMAL LOW (ref 0.30–0.70)
Heparin Unfractionated: 0.45 IU/mL (ref 0.30–0.70)
Heparin Unfractionated: 0.36 IU/mL (ref 0.30–0.70)

## 2018-05-14 LAB — GLUCOSE, CAPILLARY: Glucose-Capillary: 192 mg/dL — ABNORMAL HIGH (ref 70–99)

## 2018-05-14 LAB — TROPONIN I: Troponin I: 0.22 ng/mL (ref <0.03)

## 2018-05-14 MED ORDER — ACETAMINOPHEN 325 MG PO TABS
650.0000 mg | ORAL_TABLET | Freq: Four times a day (QID) | ORAL | Status: DC | PRN
  Administered 2018-05-15 – 2018-05-18 (×4): 650 mg via ORAL
  Filled 2018-05-14 (×3): qty 2

## 2018-05-14 MED ORDER — ACETAMINOPHEN 650 MG RE SUPP: 650.0000 mg | Freq: Four times a day (QID) | RECTAL | Status: DC | PRN

## 2018-05-14 MED ORDER — HEPARIN BOLUS VIA INFUSION
1500.0000 [IU] | Freq: Once | INTRAVENOUS | Status: AC
  Administered 2018-05-14: 1500 [IU] via INTRAVENOUS
  Filled 2018-05-14: qty 1500

## 2018-05-14 MED ORDER — POTASSIUM CHLORIDE CRYS ER 20 MEQ PO TBCR
40.0000 meq | EXTENDED_RELEASE_TABLET | Freq: Once | ORAL | Status: AC
  Administered 2018-05-14: 40 meq via ORAL
  Filled 2018-05-14: qty 2

## 2018-05-14 NOTE — Progress Notes (Addendum)
Inpatient Diabetes Program Recommendations  AACE/ADA: New Consensus Statement on Inpatient Glycemic Control (2015)  Target Ranges:  Prepandial:   less than 140 mg/dL      Peak postprandial:   less than 180 mg/dL (1-2 hours)      Critically ill patients:  140 - 180 mg/dL   Results for Craig Reese, Craig Reese (MRN [id number]) as of 05/14/2018 10:40  Ref. Range 05/14/2018 07:50  Glucose-Capillary Latest Ref Range: 70 - 99 mg/dL 409192 (H)    Admit with:  CP/ AFib with RVR  History: DM, CABG  Home DM Meds: Metformin 500 mg BID  Current Orders:  Metformin 500 mg BID    MD- Since patient admitted with CP, may want to stop Metformin for now in case he needs Cardiac intervention.  Please consider placing orders for Novolog Sensitive Correction Scale/ SSI (0-9 units) TID AC + HS      --Will follow patient during hospitalization--  Ambrose FinlandJeannine Johnston Baylei Siebels RN, MSN, CDE Diabetes Coordinator Inpatient Glycemic Control Team Team Pager: [phone] (8a-5p)

## 2018-05-14 NOTE — Progress Notes (Addendum)
[employer] [facility] [place] at [facility]   PATIENT NAME: Craig Reese    MR#:  [id number]  DATE OF BIRTH:  11/26/1960  SUBJECTIVE:  Patient without complaint, patient evaluated in the room with nursing staff, patient feels ill, noted hyperglycemia-start sliding scale insulin with Accu-Cheks per routine, start carbohydrate consistent diet/heart healthy, check hemoglobin A1c, patient denies pain/shortness of breath for heart catheterization on Monday  REVIEW OF SYSTEMS:  CONSTITUTIONAL: No fever, fatigue or weakness.  EYES: No blurred or double vision.  EARS, NOSE, AND THROAT: No tinnitus or ear pain.  RESPIRATORY: No cough, shortness of breath, wheezing or hemoptysis.  CARDIOVASCULAR: No chest pain, orthopnea, edema.  GASTROINTESTINAL: No nausea, vomiting, diarrhea or abdominal pain.  GENITOURINARY: No dysuria, hematuria.  ENDOCRINE: No polyuria, nocturia,  HEMATOLOGY: No anemia, easy bruising or bleeding SKIN: No rash or lesion. MUSCULOSKELETAL: No joint pain or arthritis.   NEUROLOGIC: No tingling, numbness, weakness.  PSYCHIATRY: No anxiety or depression.   ROS  DRUG ALLERGIES:  No Known Allergies  VITALS:  Blood pressure (!) 144/89, pulse 73, temperature 98.3 F (36.8 C), temperature source Oral, resp. rate 18, height 5\' 10"  (1.778 m), weight 124.3 kg, SpO2 96 %.  PHYSICAL EXAMINATION:  GENERAL:  57 y.o.-year-old patient lying in the bed with no acute distress.  EYES: Pupils equal, round, reactive to light and accommodation. No scleral icterus. Extraocular muscles intact.  HEENT: Head atraumatic, normocephalic. Oropharynx and nasopharynx clear.  NECK:  Supple, no jugular venous distention. No thyroid enlargement, no tenderness.  LUNGS: Normal breath sounds bilaterally, no wheezing, rales,rhonchi or crepitation. No use of accessory muscles of respiration.  CARDIOVASCULAR: S1, S2 normal. No murmurs, rubs, or gallops.  ABDOMEN: Soft, nontender, nondistended. Bowel  sounds present. No organomegaly or mass.  EXTREMITIES: No pedal edema, cyanosis, or clubbing.  NEUROLOGIC: Cranial nerves II through XII are intact. Muscle strength 5/5 in all extremities. Sensation intact. Gait not checked.  PSYCHIATRIC: The patient is alert and oriented x 3.  SKIN: No obvious rash, lesion, or ulcer.   Physical Exam LABORATORY PANEL:   CBC Recent Labs  Lab 05/14/18 0605  WBC 9.4  HGB 11.2*  HCT 34.4*  PLT 259   ------------------------------------------------------------------------------------------------------------------  Chemistries  Recent Labs  Lab 05/13/18 1051 05/14/18 0605  NA 134* 135  K 3.2* 3.2*  CL 95* 99  CO2 27 25  GLUCOSE 244* 219*  BUN 11 17  CREATININE 0.99 0.97  CALCIUM 8.5* 7.7*  AST 47*  --   ALT 51*  --   ALKPHOS 117  --   BILITOT 0.7  --    ------------------------------------------------------------------------------------------------------------------  Cardiac Enzymes Recent Labs  Lab 05/13/18 2008 05/14/18 0151  TROPONINI 0.22* 0.22*   ------------------------------------------------------------------------------------------------------------------  RADIOLOGY:  Dg Chest Portable 1 View  Result Date: 05/13/2018 CLINICAL DATA:  Chest pain. EXAM: PORTABLE CHEST 1 VIEW COMPARISON:  Chest x-ray dated Sep 22, 2013. FINDINGS: Lordotic positioning. Mild cardiomegaly status post CABG. Normal pulmonary vascularity. Low lung volumes with mild bibasilar atelectasis. No focal consolidation, pleural effusion, or pneumothorax. No acute osseous abnormality. IMPRESSION: Cardiomegaly.  Low lung volumes and mild bibasilar atelectasis. Electronically Signed   By: Obie DredgeWilliam T Derry M.D.   On: 05/13/2018 11:04    ASSESSMENT AND PLAN:  57 year old male patient with history of CAD, CABG, diabetes mellitus type 2 complains of midsternal chest pressure since morning associated with diaphoresis, found to have A. fib with RVR on arrival  *A. fib  with RVR Stable Continue heparin drip, beta-blocker therapy,  Xarelto discontinued with plans for heart catheterization on Monday in d/w Dr. Callwood/cardiology  *Acute chest pain Resolved Likely secondary to above Troponins inconsistent for acute coronary syndrome History of coronary artery disease status post CABG in 2015, bioprosthetic cardiac valve For heart catheterization on Monday for further evaluation, cardiology following, continue heparin drip for 48 hours, beta-blocker therapy, statin therapy, aspirin, nitroglycerin, IV morphine PRN breakthrough pain  *history of papillary thyroid carcinoma status post surgery in 2015 With postoperative hypothyroidism continue thyroid supplementation   *chronic systolic heart failure  EF 45 to 50% [facility]  *Acute hypokalemia Repleted  *Acute hyperglycemia ?  Secondary to stress response versus diabetes  Check hemoglobin A1c, sliding scale insulin with Accu-Cheks per routine, carbohydrate consistent diet/heart healthy, Accu-Cheks per routine   *Acute headache Topamax PRN   Discharge to home after cardiac cath on Monday with cardiology clearance barring any complications   All the records are reviewed and case discussed with Care Management/Social Workerr. Management plans discussed with the patient, family and they are in agreement.  CODE STATUS: full  TOTAL TIME TAKING CARE OF THIS PATIENT: 30 minutes.    POSSIBLE D/C IN 1-2 DAYS, DEPENDING ON CLINICAL CONDITION.   Evelena AsaMontell D Salary M.D on 05/14/2018   Between 7am to 6pm - Pager - [phone]  After 6pm go to [email or web address] - Social research officer, governmentpassword [email or web address]  [facility]  Office  [phone]  CC: Primary care physician; Center, [facility]  Note: This dictation was prepared with Nurse, children'sDragon dictation along with smaller phrase technology. Any transcriptional errors that result from this process are unintentional.

## 2018-05-14 NOTE — Consult Note (Signed)
ANTICOAGULATION CONSULT NOTE  Pharmacy Consult for heparin drip management Indication: chest pain/ACS  No Known Allergies  Patient Measurements: Height: 5\' 10"  (177.8 cm) Weight: 274 lb 1.6 oz (124.3 kg) IBW/kg (Calculated) : 73 Heparin Dosing Weight: 99 kg  Vital Signs: Temp: 97.6 F (36.4 C) (12/27 0434) Temp Source: Oral (12/27 0434) BP: 136/86 (12/27 0434) Pulse Rate: 75 (12/27 0434)  Labs: Recent Labs    05/13/18 1051 05/13/18 1656 05/13/18 2008 05/13/18 2146 05/14/18 0151 05/14/18 0605  HGB 13.7  --   --   --   --  11.2*  HCT 41.3  --   --   --   --  34.4*  PLT 303  --   --   --   --  259  APTT  --  33  --  44*  --  58*  LABPROT  --  13.9  --   --   --   --   INR  --  1.08  --   --   --   --   HEPARINUNFRC  --  <0.10*  --   --   --  0.28*  CREATININE 0.99  --   --   --   --  0.97  TROPONINI 0.05* 0.18* 0.22*  --  0.22*  --     Estimated Creatinine Clearance: 113.8 mL/min (by C-G formula based on SCr of 0.97 mg/dL).    Assessment: 57 y.o. male with a past medical history of CAD, CABG, hypothyroidism, hypertension, diabetes, taking Xarelto presents to the emergency department for chest pain. His last dose of Xarelto was 12/25 around 2100 per the patient as told through an interpreter. Initial troponin is elevated. Baseline labs have been ordered.  Heparin course 12/26 Start heparin infusion at 1350 units/hr; bolus is being withheld due to the proximity of last Xarelto dose  12/26 2146  aPTT 44, HL <0.10. Increase to 1450 units/hr and recheck in 6 hours.   Goal of Therapy:  Heparin level 0.3-0.7 units/ml APTT 66-102s Monitor platelets by anticoagulation protocol: Yes   Plan:  12/27 0605 aPTT 58, HL 0.28 - both are low. RN confirms Heparin drip running at 14.5 ml/hr; no s/sx of bleeding noted, nothing in report from night shift. Will bolus heparin 1500 units IV x1 and increase heparin drip to 1600 units/h (=16 ml/hr). Will dose based on HL as initial  baseline HL was <0.10 and both are low this check. Next HL 6h after rate change. CBC in AM. Continue to monitor H&H and platelets. Also asked RN to continue monitoring for s/sx of bleeding.   Marty HeckWang, Destynie Toomey L, PharmD, B 05/14/2018,7:54 AM

## 2018-05-14 NOTE — Plan of Care (Signed)

## 2018-05-14 NOTE — Consult Note (Signed)
ANTICOAGULATION CONSULT NOTE  Pharmacy Consult for heparin drip management Indication: chest pain/ACS  No Known Allergies  Patient Measurements: Height: 5\' 10"  (177.8 cm) Weight: 274 lb 1.6 oz (124.3 kg) IBW/kg (Calculated) : 73 Heparin Dosing Weight: 99 kg  Vital Signs: Temp: 98.3 F (36.8 C) (12/27 0833) Temp Source: Oral (12/27 0833) BP: 144/89 (12/27 1256) Pulse Rate: 73 (12/27 1256)  Labs: Recent Labs    05/13/18 1051 05/13/18 1656 05/13/18 2008 05/13/18 2146 05/14/18 0151 05/14/18 0605 05/14/18 1503  HGB 13.7  --   --   --   --  11.2*  --   HCT 41.3  --   --   --   --  34.4*  --   PLT 303  --   --   --   --  259  --   APTT  --  33  --  44*  --  58*  --   LABPROT  --  13.9  --   --   --   --   --   INR  --  1.08  --   --   --   --   --   HEPARINUNFRC  --  <0.10*  --   --   --  0.28* 0.45  CREATININE 0.99  --   --   --   --  0.97  --   TROPONINI 0.05* 0.18* 0.22*  --  0.22*  --   --     Estimated Creatinine Clearance: 113.8 mL/min (by C-G formula based on SCr of 0.97 mg/dL).    Assessment: 57 y.o. male with a past medical history of CAD, CABG, hypothyroidism, hypertension, diabetes, taking Xarelto presents to the emergency department for chest pain. His last dose of Xarelto was 12/25 around 2100 per the patient as told through an interpreter. Initial troponin is elevated. Baseline labs have been ordered.  Heparin course 12/26 Start heparin infusion at 1350 units/hr; bolus is being withheld due to the proximity of last Xarelto dose  12/26 2146  aPTT 44, HL <0.10. Increase to 1450 units/hr and recheck in 6 hours.  12/27 0605 aPTT 58, HL 0.28 - both are low. RN confirms Heparin drip running at 14.5 ml/hr; no s/sx of bleeding noted, nothing in report from night shift. Will bolus heparin 1500 units IV x1 and increase heparin drip to 1600 units/h (=16 ml/hr). Will dose based on HL as initial baseline HL was <0.10 and both are low this check. Next HL 6h after rate  change. CBC in AM. Continue to monitor H&H and platelets. Also asked RN to continue monitoring for s/sx of bleeding.    Goal of Therapy:  Heparin level 0.3-0.7 units/ml APTT 66-102s Monitor platelets by anticoagulation protocol: Yes   Plan:  HL 0.45. Continue current rate. Will order confirmatory HL in 6 hours.  Carola FrostNathan A Delynda Sepulveda, PharmD, BCPS Clinical Pharmacist 05/14/2018,3:37 PM

## 2018-05-14 NOTE — Consult Note (Signed)
ANTICOAGULATION CONSULT NOTE  Pharmacy Consult for heparin drip management Indication: chest pain/ACS  No Known Allergies  Patient Measurements: Height: 5\' 10"  (177.8 cm) Weight: 274 lb 1.6 oz (124.3 kg) IBW/kg (Calculated) : 73 Heparin Dosing Weight: 99 kg  Vital Signs: Temp: 98.6 F (37 C) (12/27 1925) Temp Source: Oral (12/27 1544) BP: 171/96 (12/27 1925) Pulse Rate: 87 (12/27 1925)  Labs: Recent Labs    05/13/18 1051  05/13/18 1656 05/13/18 2008 05/13/18 2146 05/14/18 0151 05/14/18 0605 05/14/18 1503 05/14/18 2048  HGB 13.7  --   --   --   --   --  11.2*  --   --   HCT 41.3  --   --   --   --   --  34.4*  --   --   PLT 303  --   --   --   --   --  259  --   --   APTT  --   --  33  --  44*  --  58*  --   --   LABPROT  --   --  13.9  --   --   --   --   --   --   INR  --   --  1.08  --   --   --   --   --   --   HEPARINUNFRC  --    < > <0.10*  --   --   --  0.28* 0.45 0.36  CREATININE 0.99  --   --   --   --   --  0.97  --   --   TROPONINI 0.05*  --  0.18* 0.22*  --  0.22*  --   --   --    < > = values in this interval not displayed.    Estimated Creatinine Clearance: 113.8 mL/min (by C-G formula based on SCr of 0.97 mg/dL).    Assessment: 57 y.o. male with a past medical history of CAD, CABG, hypothyroidism, hypertension, diabetes, taking Xarelto presents to the emergency department for chest pain. His last dose of Xarelto was 12/25 around 2100 per the patient as told through an interpreter. Initial troponin is elevated. Baseline labs have been ordered.  Heparin course 12/26 Start heparin infusion at 1350 units/hr; bolus is being withheld due to the proximity of last Xarelto dose  12/26 2146  aPTT 44, HL <0.10. Increase to 1450 units/hr and recheck in 6 hours.  12/27 0605 aPTT 58, HL 0.28 - both are low. RN confirms Heparin drip running at 14.5 ml/hr; no s/sx of bleeding noted, nothing in report from night shift. Will bolus heparin 1500 units IV x1 and  increase heparin drip to 1600 units/h (=16 ml/hr). Will dose based on HL as initial baseline HL was <0.10 and both are low this check. Next HL 6h after rate change. CBC in AM. Continue to monitor H&H and platelets. Also asked RN to continue monitoring for s/sx of bleeding.   12/27 15:03 HL 0.45, continue current rate  12/27 20:48 HL 0.36, 2nd therapeutic level  Goal of Therapy:  Heparin level 0.3-0.7 units/ml APTT 66-102s Monitor platelets by anticoagulation protocol: Yes   Plan:  Continue current rate. Will order HL with AM labs. Daily CBC while on Heparin drip.  Clovia CuffLisa Maronda Caison, PharmD, BCPS 05/14/2018 10:04 PM

## 2018-05-15 LAB — CBC
HCT: 35 % — ABNORMAL LOW (ref 39.0–52.0)
Hemoglobin: 11.5 g/dL — ABNORMAL LOW (ref 13.0–17.0)
MCH: 28.2 pg (ref 26.0–34.0)
MCHC: 32.9 g/dL (ref 30.0–36.0)
MCV: 85.8 fL (ref 80.0–100.0)
Platelets: 271 10*3/uL (ref 150–400)
RBC: 4.08 MIL/uL — ABNORMAL LOW (ref 4.22–5.81)
RDW: 13.3 % (ref 11.5–15.5)
WBC: 9 10*3/uL (ref 4.0–10.5)
nRBC: 0 % (ref 0.0–0.2)

## 2018-05-15 LAB — GLUCOSE, CAPILLARY
Glucose-Capillary: 285 mg/dL — ABNORMAL HIGH (ref 70–99)
Glucose-Capillary: 275 mg/dL — ABNORMAL HIGH (ref 70–99)
Glucose-Capillary: 182 mg/dL — ABNORMAL HIGH (ref 70–99)

## 2018-05-15 LAB — HEPARIN LEVEL (UNFRACTIONATED)
HEPARIN UNFRACTIONATED: 0.54 [IU]/mL (ref 0.30–0.70)
Heparin Unfractionated: 0.29 IU/mL — ABNORMAL LOW (ref 0.30–0.70)
Heparin Unfractionated: 0.48 IU/mL (ref 0.30–0.70)

## 2018-05-15 LAB — BASIC METABOLIC PANEL
Anion gap: 9 (ref 5–15)
BUN: 19 mg/dL (ref 6–20)
CO2: 28 mmol/L (ref 22–32)
Calcium: 7.9 mg/dL — ABNORMAL LOW (ref 8.9–10.3)
Chloride: 98 mmol/L (ref 98–111)
Creatinine, Ser: 1.05 mg/dL (ref 0.61–1.24)
GFR calc Af Amer: >60 mL/min (ref >60)
GFR calc non Af Amer: >60 mL/min (ref >60)
Glucose, Bld: 277 mg/dL — ABNORMAL HIGH (ref 70–99)
Potassium: 3.5 mmol/L (ref 3.5–5.1)
Sodium: 135 mmol/L (ref 135–145)

## 2018-05-15 LAB — HIV ANTIBODY (ROUTINE TESTING W REFLEX): HIV Screen 4th Generation wRfx: NONREACTIVE

## 2018-05-15 LAB — HEMOGLOBIN A1C
Hgb A1c MFr Bld: 9.3 % — ABNORMAL HIGH (ref 4.8–5.6)
Mean Plasma Glucose: 220.21 mg/dL

## 2018-05-15 MED ORDER — INSULIN ASPART 100 UNIT/ML ~~LOC~~ SOLN
0.0000 [IU] | Freq: Three times a day (TID) | SUBCUTANEOUS | Status: DC
  Administered 2018-05-15: 11 [IU] via SUBCUTANEOUS
  Administered 2018-05-16: 7 [IU] via SUBCUTANEOUS
  Administered 2018-05-16: 11 [IU] via SUBCUTANEOUS
  Administered 2018-05-16 – 2018-05-18 (×4): 4 [IU] via SUBCUTANEOUS
  Filled 2018-05-15 (×7): qty 1

## 2018-05-15 MED ORDER — TOPIRAMATE 25 MG PO TABS
50.0000 mg | ORAL_TABLET | Freq: Two times a day (BID) | ORAL | Status: DC | PRN
  Administered 2018-05-15: 50 mg via ORAL
  Filled 2018-05-15 (×2): qty 2

## 2018-05-15 MED ORDER — HEPARIN BOLUS VIA INFUSION
1500.0000 [IU] | Freq: Once | INTRAVENOUS | Status: AC
  Administered 2018-05-15: 1500 [IU] via INTRAVENOUS
  Filled 2018-05-15: qty 1500

## 2018-05-15 MED ORDER — INSULIN ASPART 100 UNIT/ML ~~LOC~~ SOLN
0.0000 [IU] | Freq: Every day | SUBCUTANEOUS | Status: DC
  Administered 2018-05-16: 3 [IU] via SUBCUTANEOUS
  Filled 2018-05-15: qty 1

## 2018-05-15 NOTE — Consult Note (Signed)
ANTICOAGULATION CONSULT NOTE  Pharmacy Consult for heparin drip management Indication: chest pain/ACS  No Known Allergies  Patient Measurements: Height: 5\' 10"  (177.8 cm) Weight: 272 lb 6.4 oz (123.6 kg) IBW/kg (Calculated) : 73 Heparin Dosing Weight: 99 kg  Vital Signs: Temp: 99.2 F (37.3 C) (12/28 0904) BP: 170/102 (12/28 0905) Pulse Rate: 77 (12/28 0905)  Labs: Recent Labs    05/13/18 1051  05/13/18 1656 05/13/18 2008 05/13/18 2146 05/14/18 0151 05/14/18 0605  05/14/18 2048 05/15/18 0314 05/15/18 1058  HGB 13.7  --   --   --   --   --  11.2*  --   --  11.5*  --   HCT 41.3  --   --   --   --   --  34.4*  --   --  35.0*  --   PLT 303  --   --   --   --   --  259  --   --  271  --   APTT  --   --  33  --  44*  --  58*  --   --   --   --   LABPROT  --   --  13.9  --   --   --   --   --   --   --   --   INR  --   --  1.08  --   --   --   --   --   --   --   --   HEPARINUNFRC  --    < > <0.10*  --   --   --  0.28*   < > 0.36 0.29* 0.54  CREATININE 0.99  --   --   --   --   --  0.97  --   --  1.05  --   TROPONINI 0.05*  --  0.18* 0.22*  --  0.22*  --   --   --   --   --    < > = values in this interval not displayed.    Estimated Creatinine Clearance: 104.8 mL/min (by C-G formula based on SCr of 1.05 mg/dL).    Assessment: 57 y.o. male with a past medical history of CAD, CABG, hypothyroidism, hypertension, diabetes, taking Xarelto presents to the emergency department for chest pain. His last dose of Xarelto was 12/25 around 2100 per the patient as told through an interpreter. Initial troponin is elevated. Baseline labs have been ordered.  Heparin course 12/26 Start heparin infusion at 1350 units/hr; bolus is being withheld due to the proximity of last Xarelto dose  12/26 2146  aPTT 44, HL <0.10. Increase to 1450 units/hr and recheck in 6 hours.  12/27 0605 aPTT 58, HL 0.28 - both are low. RN confirms Heparin drip running at 14.5 ml/hr; no s/sx of bleeding noted,  nothing in report from night shift. Will bolus heparin 1500 units IV x1 and increase heparin drip to 1600 units/h (=16 ml/hr). Will dose based on HL as initial baseline HL was <0.10 and both are low this check. Next HL 6h after rate change. CBC in AM. Continue to monitor H&H and platelets. Also asked RN to continue monitoring for s/sx of bleeding.   12/27 15:03 HL 0.45, continue current rate  12/27 20:48 HL 0.36, 2nd therapeutic level  12/28 03:14 HL 0.29  12/28 10:58 HL 0.54   Goal of Therapy:  Heparin level 0.3-0.7 units/ml APTT  66-102s Monitor platelets by anticoagulation protocol: Yes   Plan:  12/28 @ 10:58 Level is therapeutic x 1. Will continue heparin 1800 units/hr. Next Heparin level in 6 hours. Daily CBC while on Heparin drip per protocol.   Gardner CandleSheema M Calysta Craigo, PharmD, BCPS Clinical Pharmacist 05/15/2018 11:35 AM

## 2018-05-15 NOTE — Consult Note (Signed)
ANTICOAGULATION CONSULT NOTE  Pharmacy Consult for heparin drip management Indication: chest pain/ACS  No Known Allergies  Patient Measurements: Height: 5\' 10"  (177.8 cm) Weight: 274 lb 1.6 oz (124.3 kg) IBW/kg (Calculated) : 73 Heparin Dosing Weight: 99 kg  Vital Signs: Temp: 98.6 F (37 C) (12/27 1925) BP: 154/85 (12/28 0111) Pulse Rate: 75 (12/28 0111)  Labs: Recent Labs    05/13/18 1051  05/13/18 1656 05/13/18 2008 05/13/18 2146 05/14/18 0151 05/14/18 0605 05/14/18 1503 05/14/18 2048 05/15/18 0314  HGB 13.7  --   --   --   --   --  11.2*  --   --  11.5*  HCT 41.3  --   --   --   --   --  34.4*  --   --  35.0*  PLT 303  --   --   --   --   --  259  --   --  271  APTT  --   --  33  --  44*  --  58*  --   --   --   LABPROT  --   --  13.9  --   --   --   --   --   --   --   INR  --   --  1.08  --   --   --   --   --   --   --   HEPARINUNFRC  --    < > <0.10*  --   --   --  0.28* 0.45 0.36 0.29*  CREATININE 0.99  --   --   --   --   --  0.97  --   --  1.05  TROPONINI 0.05*  --  0.18* 0.22*  --  0.22*  --   --   --   --    < > = values in this interval not displayed.    Estimated Creatinine Clearance: 105.1 mL/min (by C-G formula based on SCr of 1.05 mg/dL).    Assessment: 57 y.o. male with a past medical history of CAD, CABG, hypothyroidism, hypertension, diabetes, taking Xarelto presents to the emergency department for chest pain. His last dose of Xarelto was 12/25 around 2100 per the patient as told through an interpreter. Initial troponin is elevated. Baseline labs have been ordered.  Heparin course 12/26 Start heparin infusion at 1350 units/hr; bolus is being withheld due to the proximity of last Xarelto dose  12/26 2146  aPTT 44, HL <0.10. Increase to 1450 units/hr and recheck in 6 hours.  12/27 0605 aPTT 58, HL 0.28 - both are low. RN confirms Heparin drip running at 14.5 ml/hr; no s/sx of bleeding noted, nothing in report from night shift. Will bolus  heparin 1500 units IV x1 and increase heparin drip to 1600 units/h (=16 ml/hr). Will dose based on HL as initial baseline HL was <0.10 and both are low this check. Next HL 6h after rate change. CBC in AM. Continue to monitor H&H and platelets. Also asked RN to continue monitoring for s/sx of bleeding.   12/27 15:03 HL 0.45, continue current rate  12/27 20:48 HL 0.36, 2nd therapeutic level  12/28 03:14 HL 0.29  Goal of Therapy:  Heparin level 0.3-0.7 units/ml APTT 66-102s Monitor platelets by anticoagulation protocol: Yes   Plan:  HL is subtherapeutic,will order Heparin 1500 units IV bolus and increase drip rate to 1800 units/hr. Next Heparin level in 6 hours. Daily CBC  while on Heparin drip.  Clovia CuffLisa Javae Braaten, PharmD, BCPS 05/15/2018 4:30 AM

## 2018-05-15 NOTE — Progress Notes (Signed)
ANTICOAGULATION CONSULT NOTE - Initial Consult  Pharmacy Consult for Heparin  Indication: chest pain/ACS  No Known Allergies  Patient Measurements: Height: 5\' 10"  (177.8 cm) Weight: 272 lb 6.4 oz (123.6 kg) IBW/kg (Calculated) : 73 Heparin Dosing Weight: 99 kg   Vital Signs: Temp: 98.8 F (37.1 C) (12/28 1747) Temp Source: Oral (12/28 1747) BP: 152/92 (12/28 1813) Pulse Rate: 79 (12/28 1813)  Labs: Recent Labs    05/13/18 1051  05/13/18 1656 05/13/18 2008 05/13/18 2146 05/14/18 0151 05/14/18 0605  05/15/18 0314 05/15/18 1058 05/15/18 1921  HGB 13.7  --   --   --   --   --  11.2*  --  11.5*  --   --   HCT 41.3  --   --   --   --   --  34.4*  --  35.0*  --   --   PLT 303  --   --   --   --   --  259  --  271  --   --   APTT  --   --  33  --  44*  --  58*  --   --   --   --   LABPROT  --   --  13.9  --   --   --   --   --   --   --   --   INR  --   --  1.08  --   --   --   --   --   --   --   --   HEPARINUNFRC  --    < > <0.10*  --   --   --  0.28*   < > 0.29* 0.54 0.48  CREATININE 0.99  --   --   --   --   --  0.97  --  1.05  --   --   TROPONINI 0.05*  --  0.18* 0.22*  --  0.22*  --   --   --   --   --    < > = values in this interval not displayed.    Estimated Creatinine Clearance: 104.8 mL/min (by C-G formula based on SCr of 1.05 mg/dL).   Medical History: Past Medical History:  Diagnosis Date  . Coronary artery disease     Medications:  Medications Prior to Admission  Medication Sig Dispense Refill Last Dose  . gabapentin (NEURONTIN) 300 MG capsule Take 1 capsule by mouth 2 (two) times daily.   05/12/2018 at 2100  . hydrALAZINE (APRESOLINE) 25 MG tablet Take 1 tablet by mouth 3 (three) times daily.   05/12/2018 at 2100  . levothyroxine (SYNTHROID) 150 MCG tablet Take 1 tablet by mouth daily.   05/12/2018 at 0600  . metFORMIN (GLUCOPHAGE) 500 MG tablet Take 1 tablet by mouth 2 (two) times daily.   05/12/2018 at 2100  . omeprazole (PRILOSEC) 20 MG capsule  Take 2 capsules by mouth daily.   05/12/2018 at 0600  . rivaroxaban (XARELTO) 20 MG TABS tablet Take 1 tablet by mouth daily.   05/12/2018 at 2100  . atorvastatin (LIPITOR) 40 MG tablet Take 1 tablet by mouth daily.   Not Taking at Unknown time  . furosemide (LASIX) 40 MG tablet Take 1 tablet by mouth daily.   Not Taking at Unknown time  . metoprolol tartrate (LOPRESSOR) 50 MG tablet Take 1 tablet by mouth 2 (two) times daily.   Not Taking  at Unknown time  . potassium chloride (K-DUR) 10 MEQ tablet Take 1 tablet by mouth daily.   Not Taking at Unknown time    Assessment: 57 y.o.malewith a past medical history of CAD, CABG, hypothyroidism, hypertension, diabetes, taking Xarelto presents to the emergency department for chest pain. His last dose of Xarelto was 12/25 around 2100 per the patient as told through an interpreter. Initial troponin is elevated. Baseline labs have been ordered.  Heparin course 12/26 Start heparin infusion at 1350 units/hr; bolus is being withheld due to the proximity of last Xarelto dose  12/26 2146  aPTT 44, HL <0.10. Increase to 1450 units/hr and recheck in 6 hours.  12/27 0605 aPTT 58, HL 0.28 - both are low. RN confirms Heparin drip running at 14.5 ml/hr; no s/sx of bleeding noted, nothing in report from night shift. Will bolus heparin 1500 units IV x1 and increase heparin drip to 1600 units/h (=16 ml/hr). Will dose based on HL as initial baseline HL was <0.10 and both are low this check. Next HL 6h after rate change. CBC in AM. Continue to monitor H&H and platelets. Also asked RN to continue monitoring for s/sx of bleeding.   12/27 15:03 HL 0.45, continue current rate  12/27 20:48 HL 0.36, 2nd therapeutic level  12/28 03:14 HL 0.29  12/28 10:58 HL 0.54   Goal of Therapy:  Heparin level 0.3-0.7 units/ml APTT 66-102s Monitor platelets by anticoagulation protocol: Yes   Plan:  12/28 @ 10:58 Level is therapeutic x 1. Will continue heparin 1800  units/hr. Next Heparin level in 6 hours. Daily CBC while on Heparin drip per protocol.   12/28 :  HL @ 1921 = 0.48 Will continue pt on current rate and recheck HL on 12/29 with AM labs.  Kristia Jupiter D 05/15/2018,7:43 PM

## 2018-05-15 NOTE — Progress Notes (Signed)
Patient complaining of headache, but no chest pain. Patient refused scheduled nitroglycerin ointment. MD paged to ask to put in orders for headache and sliding scale as patient is diabetic with no orders for insulin. MD aware of this situation as MD was notified on morning rounds in patient's room. Orders from MD to put give 50mg  Topamax BID PRN for headache. Will administer and continue to monitor patient.

## 2018-05-16 LAB — GLUCOSE, CAPILLARY
GLUCOSE-CAPILLARY: 296 mg/dL — AB (ref 70–99)
Glucose-Capillary: 224 mg/dL — ABNORMAL HIGH (ref 70–99)
Glucose-Capillary: 258 mg/dL — ABNORMAL HIGH (ref 70–99)
Glucose-Capillary: 187 mg/dL — ABNORMAL HIGH (ref 70–99)

## 2018-05-16 LAB — HEPARIN LEVEL (UNFRACTIONATED): Heparin Unfractionated: 0.47 IU/mL (ref 0.30–0.70)

## 2018-05-16 LAB — CBC
HCT: 36.8 % — ABNORMAL LOW (ref 39.0–52.0)
Hemoglobin: 12 g/dL — ABNORMAL LOW (ref 13.0–17.0)
MCH: 28 pg (ref 26.0–34.0)
MCHC: 32.6 g/dL (ref 30.0–36.0)
MCV: 85.8 fL (ref 80.0–100.0)
Platelets: 288 10*3/uL (ref 150–400)
RBC: 4.29 MIL/uL (ref 4.22–5.81)
RDW: 13.5 % (ref 11.5–15.5)
WBC: 11.9 10*3/uL — ABNORMAL HIGH (ref 4.0–10.5)
nRBC: 0 % (ref 0.0–0.2)

## 2018-05-16 MED ORDER — ASPIRIN 81 MG PO CHEW
81.0000 mg | CHEWABLE_TABLET | ORAL | Status: AC
  Administered 2018-05-17: 81 mg via ORAL
  Filled 2018-05-16: qty 1

## 2018-05-16 MED ORDER — SODIUM CHLORIDE 0.9 % WEIGHT BASED INFUSION
1.0000 mL/kg/h | INTRAVENOUS | Status: DC
  Administered 2018-05-17 (×3): 1 mL/kg/h via INTRAVENOUS

## 2018-05-16 MED ORDER — SODIUM CHLORIDE 0.9 % IV SOLN: 250.0000 mL | INTRAVENOUS | Status: DC | PRN

## 2018-05-16 MED ORDER — SODIUM CHLORIDE 0.9 % WEIGHT BASED INFUSION
3.0000 mL/kg/h | INTRAVENOUS | Status: AC
  Administered 2018-05-17: 3 mL/kg/h via INTRAVENOUS

## 2018-05-16 MED ORDER — SODIUM CHLORIDE 0.9% FLUSH: 3.0000 mL | INTRAVENOUS | Status: DC | PRN

## 2018-05-16 MED ORDER — OXYCODONE-ACETAMINOPHEN 5-325 MG PO TABS
1.0000 | ORAL_TABLET | Freq: Four times a day (QID) | ORAL | Status: DC | PRN
  Administered 2018-05-16 – 2018-05-18 (×6): 1 via ORAL
  Filled 2018-05-16 (×6): qty 1

## 2018-05-16 MED ORDER — MORPHINE SULFATE (PF) 4 MG/ML IV SOLN
4.0000 mg | INTRAVENOUS | Status: DC | PRN
  Administered 2018-05-18: 4 mg via INTRAVENOUS
  Filled 2018-05-16: qty 1

## 2018-05-16 MED ORDER — SODIUM CHLORIDE 0.9% FLUSH
3.0000 mL | Freq: Two times a day (BID) | INTRAVENOUS | Status: DC
  Administered 2018-05-16 (×2): 3 mL via INTRAVENOUS

## 2018-05-16 NOTE — Progress Notes (Signed)
Subjective:  Patient still does not feel well no discrete significant chest pain no worsening shortness of breath now here for follow-up evaluation and possible cardiac  Objective:  Vital Signs in the last 24 hours: Temp:  [97.8 F (36.6 C)-98.5 F (36.9 C)] 98.3 F (36.8 C) (12/29 1727) Pulse Rate:  [72-83] 76 (12/29 1727) Resp:  [18-20] 20 (12/29 1727) BP: (143-145)/(90-94) 145/90 (12/29 1727) SpO2:  [89 %-96 %] 96 % (12/29 1727) Weight:  [123.9 kg] 123.9 kg (12/29 0331)  Intake/Output from previous day: 12/28 0701 - 12/29 0700 In: 0  Out: 1600 [Urine:1600] Intake/Output from this shift: Total I/O In: -  Out: 500 [Urine:500]  Physical Exam: General appearance: appears older than stated age Neck: no adenopathy, no carotid bruit, no JVD, supple, symmetrical, trachea midline and thyroid not enlarged, symmetric, no tenderness/mass/nodules Lungs: clear to auscultation bilaterally Heart: regular rate and rhythm, S1, S2 normal, no murmur, click, rub or gallop Abdomen: soft, non-tender; bowel sounds normal; no masses,  no organomegaly Extremities: extremities normal, atraumatic, no cyanosis or edema Pulses: 2+ and symmetric Skin: Skin color, texture, turgor normal. No rashes or lesions Neurologic: Alert and oriented X 3, normal strength and tone. Normal symmetric reflexes. Normal coordination and gait  Lab Results: Recent Labs    05/15/18 0314 05/16/18 0505  WBC 9.0 11.9*  HGB 11.5* 12.0*  PLT 271 288   Recent Labs    05/14/18 0605 05/15/18 0314  NA 135 135  K 3.2* 3.5  CL 99 98  CO2 25 28  GLUCOSE 219* 277*  BUN 17 19  CREATININE 0.97 1.05   Recent Labs    05/13/18 2008 05/14/18 0151  TROPONINI 0.22* 0.22*   Hepatic Function Panel No results for input(s): PROT, ALBUMIN, AST, ALT, ALKPHOS, BILITOT, BILIDIR, IBILI in the last 72 hours. No results for input(s): CHOL in the last 72 hours. No results for input(s): PROTIME in the last 72  hours.  Imaging: Imaging results have been reviewed  Cardiac Studies:  Assessment/Plan:  Angina CABG Chest Pain Coronary Artery Disease Shortness of Breath  Borderline troponin Obesity Chest pain History of DVT Atrial fibrillation rapid ventricular response Mild cardiomyopathy systolic ejection fraction 45 to 50% Hypokalemia . Plan Agree with admit rule out for myocardial infarction Status post bioprosthetic valve Short-term IV anticoagulation for atrial fibrillation as well as DVT with heparin Recommend cardiac cath because of borderline troponins Evaluate coronary disease as well as coronary bypass graft Correct electrolytes including hypokalemia Resume Xarelto for DVT and A. fib upon discharge Would recommend weight loss exercise portion control Consider evaluation for obstructive sleep apnea   LOS: 3 days    Craig Reese 05/16/2018, 6:19 PM

## 2018-05-16 NOTE — Consult Note (Signed)
ANTICOAGULATION CONSULT NOTE  Pharmacy Consult for heparin drip management Indication: chest pain/ACS  No Known Allergies  Patient Measurements: Height: 5\' 10"  (177.8 cm) Weight: 273 lb 3.2 oz (123.9 kg) IBW/kg (Calculated) : 73 Heparin Dosing Weight: 99 kg  Vital Signs: Temp: 98.1 F (36.7 C) (12/29 0331) Temp Source: Oral (12/29 0331) BP: 143/92 (12/29 0331) Pulse Rate: 75 (12/29 0331)  Labs: Recent Labs    05/13/18 1051  05/13/18 1656 05/13/18 2008 05/13/18 2146 05/14/18 0151 05/14/18 0605  05/15/18 0314 05/15/18 1058 05/15/18 1921 05/16/18 0505  HGB 13.7  --   --   --   --   --  11.2*  --  11.5*  --   --  12.0*  HCT 41.3  --   --   --   --   --  34.4*  --  35.0*  --   --  36.8*  PLT 303  --   --   --   --   --  259  --  271  --   --  288  APTT  --   --  33  --  44*  --  58*  --   --   --   --   --   LABPROT  --   --  13.9  --   --   --   --   --   --   --   --   --   INR  --   --  1.08  --   --   --   --   --   --   --   --   --   HEPARINUNFRC  --    < > <0.10*  --   --   --  0.28*   < > 0.29* 0.54 0.48 0.47  CREATININE 0.99  --   --   --   --   --  0.97  --  1.05  --   --   --   TROPONINI 0.05*  --  0.18* 0.22*  --  0.22*  --   --   --   --   --   --    < > = values in this interval not displayed.    Estimated Creatinine Clearance: 105 mL/min (by C-G formula based on SCr of 1.05 mg/dL).    Assessment: 57 y.o. male with a past medical history of CAD, CABG, hypothyroidism, hypertension, diabetes, taking Xarelto presents to the emergency department for chest pain. His last dose of Xarelto was 12/25 around 2100 per the patient as told through an interpreter. Initial troponin is elevated. Baseline labs have been ordered.  Heparin course 12/26 Start heparin infusion at 1350 units/hr; bolus is being withheld due to the proximity of last Xarelto dose  12/26 2146  aPTT 44, HL <0.10. Increase to 1450 units/hr and recheck in 6 hours.  12/27 0605 aPTT 58, HL 0.28 -  both are low. RN confirms Heparin drip running at 14.5 ml/hr; no s/sx of bleeding noted, nothing in report from night shift. Will bolus heparin 1500 units IV x1 and increase heparin drip to 1600 units/h (=16 ml/hr). Will dose based on HL as initial baseline HL was <0.10 and both are low this check. Next HL 6h after rate change. CBC in AM. Continue to monitor H&H and platelets. Also asked RN to continue monitoring for s/sx of bleeding.   12/27 15:03 HL 0.45, continue current rate  12/27 20:48  HL 0.36, 2nd therapeutic level  12/28 03:14 HL 0.29  12/28 10:58 HL 0.54  12/29 05:05 HL 0.47  Goal of Therapy:  Heparin level 0.3-0.7 units/ml APTT 66-102s Monitor platelets by anticoagulation protocol: Yes   Plan:  Level is therapeutic x 2. Will continue heparin 1800 units/hr. Next Heparin level with AM labs. Daily CBC while on Heparin drip per protocol.   Clovia CuffLisa Rad Gramling, PharmD, BCPS 05/16/2018 5:41 AM

## 2018-05-16 NOTE — Consult Note (Signed)
Reason for Consult: Elevated troponin atrial fibrillation rapid ventricular response known coronary disease Referring Physician: Dr. Luberta MutterKonidena hospitalist, [facility] primary Cardiologist [facility]  Craig PernaJose Reese is an 57 y.o. male.  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  Patient also had SVG to OM.  He has had paroxysmal atrial fibrillation in the past postop patient is been on Xarelto because of DVT and possibly atrial fibrillation.  Comes in with palpitations tachycardia as well as some chest pain generalized weakness and fatigue no blackout spells or syncope.  Denies any significant fever chills or sweats.  [facility] some [facility].  Now admitted for further assessment and evaluation  Past Medical History:  Diagnosis Date  . Coronary artery disease     Past Surgical History:  Procedure Laterality Date  . CORONARY ARTERY BYPASS GRAFT      History reviewed. No pertinent family history.  Social History:  reports previous alcohol use. No history on file for tobacco and drug.  Allergies: No Known Allergies  Medications: I have reviewed the patient's current medications.  Results for orders placed or performed during the hospital encounter of 05/13/18 (from the past 48 hour(s))  Heparin level (unfractionated)     Status: None   Collection Time: 05/14/18  8:48 PM  Result Value Ref Range   Heparin Unfractionated 0.36 0.30 - 0.70 IU/mL    Comment: (NOTE) If heparin results are below expected values, and patient dosage has  been confirmed, suggest follow up testing of antithrombin III levels. Performed at [facility], [address] [place], [place] [address]   CBC     Status: Abnormal   Collection Time: 05/15/18  3:14 AM  Result Value Ref Range   WBC 9.0 4.0 - 10.5 K/uL   RBC 4.08 (L) 4.22 -  5.81 MIL/uL   Hemoglobin 11.5 (L) 13.0 - 17.0 g/dL   HCT 60.435.0 (L) 54.039.0 - 98.152.0 %   MCV 85.8 80.0 - 100.0 fL   MCH 28.2 26.0 - 34.0 pg   MCHC 32.9 30.0 - 36.0 g/dL   RDW 19.113.3 47.811.5 - 29.515.5 %   Platelets 271 150 - 400 K/uL   nRBC 0.0 0.0 - 0.2 %    Comment: Performed at [facility], [address] [place], [place] [address]  Basic metabolic panel     Status: Abnormal   Collection Time: 05/15/18  3:14 AM  Result Value Ref Range   Sodium 135 135 - 145 mmol/L   Potassium 3.5 3.5 - 5.1 mmol/L   Chloride 98 98 - 111 mmol/L   CO2 28 22 - 32 mmol/L   Glucose, Bld 277 (H) 70 - 99 mg/dL   BUN 19 6 - 20 mg/dL   Creatinine, Ser 8.651.05 0.61 - 1.24 mg/dL   Calcium 7.9 (L) 8.9 - 10.3 mg/dL   GFR calc non Af Amer >60 >60 mL/min   GFR calc Af Amer >60 >60 mL/min   Anion gap 9 5 - 15    Comment: Performed at [facility], [address] [place], [place] [address]  Heparin level (unfractionated)     Status: Abnormal   Collection Time: 05/15/18  3:14 AM  Result Value Ref Range   Heparin Unfractionated 0.29 (L) 0.30 - 0.70 IU/mL    Comment: (NOTE) If heparin results are below expected values, and  patient dosage has  been confirmed, suggest follow up testing of antithrombin III levels. Performed at [facility], [address] [place], [place] [address]   Glucose, capillary     Status: Abnormal   Collection Time: 05/15/18  8:17 AM  Result Value Ref Range   Glucose-Capillary 285 (H) 70 - 99 mg/dL  Heparin level (unfractionated)     Status: None   Collection Time: 05/15/18 10:58 AM  Result Value Ref Range   Heparin Unfractionated 0.54 0.30 - 0.70 IU/mL    Comment: (NOTE) If heparin results are below expected values, and patient dosage has  been confirmed, suggest follow up testing of antithrombin III levels. Performed at [facility], [address] [place], [place] [address]   Glucose, capillary     Status: Abnormal   Collection Time: 05/15/18  4:49  PM  Result Value Ref Range   Glucose-Capillary 275 (H) 70 - 99 mg/dL   Comment 1 Notify RN   Heparin level (unfractionated)     Status: None   Collection Time: 05/15/18  7:21 PM  Result Value Ref Range   Heparin Unfractionated 0.48 0.30 - 0.70 IU/mL    Comment: (NOTE) If heparin results are below expected values, and patient dosage has  been confirmed, suggest follow up testing of antithrombin III levels. Performed at [facility], [address] [place], [place] [address]   Hemoglobin A1c     Status: Abnormal   Collection Time: 05/15/18  7:21 PM  Result Value Ref Range   Hgb A1c MFr Bld 9.3 (H) 4.8 - 5.6 %    Comment: (NOTE) Pre diabetes:          5.7%-6.4% Diabetes:              >6.4% Glycemic control for   <7.0% adults with diabetes    Mean Plasma Glucose 220.21 mg/dL    Comment: Performed at [facility], [address] [place], [place] [address]  Glucose, capillary     Status: Abnormal   Collection Time: 05/15/18  9:08 PM  Result Value Ref Range   Glucose-Capillary 182 (H) 70 - 99 mg/dL   Comment 1 Notify RN   CBC     Status: Abnormal   Collection Time: 05/16/18  5:05 AM  Result Value Ref Range   WBC 11.9 (H) 4.0 - 10.5 K/uL   RBC 4.29 4.22 - 5.81 MIL/uL   Hemoglobin 12.0 (L) 13.0 - 17.0 g/dL   HCT 46.936.8 (L) 62.939.0 - 52.852.0 %   MCV 85.8 80.0 - 100.0 fL   MCH 28.0 26.0 - 34.0 pg   MCHC 32.6 30.0 - 36.0 g/dL   RDW 41.313.5 24.411.5 - 01.015.5 %   Platelets 288 150 - 400 K/uL   nRBC 0.0 0.0 - 0.2 %    Comment: Performed at [facility], [address] [place], [place] [address]  Heparin level (unfractionated)     Status: None   Collection Time: 05/16/18  5:05 AM  Result Value Ref Range   Heparin Unfractionated 0.47 0.30 - 0.70 IU/mL    Comment: (NOTE) If heparin results are below expected values, and patient dosage has  been confirmed, suggest follow up testing of antithrombin III levels. Performed at [facility], [address]  [place], [place] [address]   Glucose, capillary     Status: Abnormal   Collection Time: 05/16/18  8:21 AM  Result Value Ref Range   Glucose-Capillary 224 (H) 70 -  99 mg/dL  Glucose, capillary     Status: Abnormal   Collection Time: 05/16/18 12:33 PM  Result Value Ref Range   Glucose-Capillary 258 (H) 70 - 99 mg/dL  Glucose, capillary     Status: Abnormal   Collection Time: 05/16/18  5:27 PM  Result Value Ref Range   Glucose-Capillary 187 (H) 70 - 99 mg/dL    No results found.  Review of Systems  Constitutional: Positive for diaphoresis and malaise/fatigue.  HENT: Positive for congestion.   Eyes: Negative.   Respiratory: Positive for cough and shortness of breath.   Cardiovascular: Positive for chest pain, palpitations, leg swelling and PND.  Gastrointestinal: Positive for heartburn.  Genitourinary: Negative.   Musculoskeletal: Positive for back pain and myalgias.  Skin: Negative.   Neurological: Positive for dizziness and weakness.  Endo/Heme/Allergies: Negative.   Psychiatric/Behavioral: Negative.    Blood pressure (!) 145/90, pulse 76, temperature 98.3 F (36.8 C), temperature source Oral, resp. rate 20, height 5\' 10"  (1.778 m), weight 123.9 kg, SpO2 96 %. Physical Exam  Nursing note and vitals reviewed. Constitutional: He is oriented to person, place, and time. He appears well-developed and well-nourished.  HENT:  Head: Normocephalic and atraumatic.  Eyes: Pupils are equal, round, and reactive to light. Conjunctivae and EOM are normal.  Neck: Normal range of motion. Neck supple.  Cardiovascular: Normal heart sounds. An irregularly irregular rhythm present. Tachycardia present.  Respiratory: Effort normal and breath sounds normal.  GI: Soft. Bowel sounds are normal.  Musculoskeletal: Normal range of motion.        General: Edema present.  Neurological: He is alert and oriented to person, place, and time. He has normal reflexes.  Skin: Skin is warm and dry.  Psychiatric:  He has a normal mood and affect.    Assessment/Plan: Elevated troponin Atrial fibrillation rapid ventricular response History of DVT on Xarelto Obesity Coronary disease History of coronary bypass surgery 2015 History of bioprosthetic valve aortic 2015 History of endocarditis of aortic valve coronary disease Generalized weakness . Plan Agree with admit rule out for myocardial infarction Continue IV heparin therapy short-term for anticoagulation Consider antiarrhythmic for atrial fibrillation rapid ventricular response Consider echocardiogram for assessment of bioprosthetic aortic valve replacement Consider inpatient cardiac cath for evaluation of aortic valve as well as bypass graft Borderline troponin may be related to demand ischemia Consider evaluation by endocrine for thyroid disease We will delay cardiac cath because patient is been on Xarelto will perform on Monday     Marieke Lubke D Diona Peregoy 05/16/2018, 6:28 PM

## 2018-05-16 NOTE — Plan of Care (Signed)
  Problem: Clinical Measurements: Goal: Respiratory complications will improve Outcome: Progressing   Problem: Activity: Goal: Risk for activity intolerance will decrease Outcome: Progressing   Problem: Pain Managment: Goal: General experience of comfort will improve Outcome: Progressing   Problem: Safety: Goal: Ability to remain free from injury will improve Outcome: Progressing   Problem: Activity: Goal: Ability to tolerate increased activity will improve Outcome: Progressing

## 2018-05-16 NOTE — Progress Notes (Signed)
[employer] [facility] [place] [facility]   PATIENT NAME: Craig Reese    MR#:  [id number]  DATE OF BIRTH:  1960/07/20  SUBJECTIVE:  Patient complains of right foot pain. REVIEW OF SYSTEMS:  CONSTITUTIONAL: No fever, fatigue or weakness.  EYES: No blurred or double vision.  EARS, NOSE, AND THROAT: No tinnitus or ear pain.  RESPIRATORY: No cough, shortness of breath, wheezing or hemoptysis.  CARDIOVASCULAR: No chest pain, orthopnea, edema.  GASTROINTESTINAL: No nausea, vomiting, diarrhea or abdominal pain.  GENITOURINARY: No dysuria, hematuria.  ENDOCRINE: No polyuria, nocturia,  HEMATOLOGY: No anemia, easy bruising or bleeding SKIN: No rash or lesion. MUSCULOSKELETAL: No joint pain or arthritis.   right foot pain. NEUROLOGIC: No tingling, numbness, weakness.  PSYCHIATRY: No anxiety or depression.   ROS  DRUG ALLERGIES:  No Known Allergies  VITALS:  Blood pressure (!) 143/91, pulse 72, temperature 97.8 F (36.6 C), resp. rate 18, height 5\' 10"  (1.778 m), weight 123.9 kg, SpO2 (!) 89 %.  PHYSICAL EXAMINATION:  GENERAL:  57 y.o.-year-old patient lying in the bed with no acute distress.  EYES: Pupils equal, round, reactive to light and accommodation. No scleral icterus. Extraocular muscles intact.  HEENT: Head atraumatic, normocephalic. Oropharynx and nasopharynx clear.  NECK:  Supple, no jugular venous distention. No thyroid enlargement, no tenderness.  LUNGS: Normal breath sounds bilaterally, no wheezing, rales,rhonchi or crepitation. No use of accessory muscles of respiration.  CARDIOVASCULAR: S1, S2 normal. No murmurs, rubs, or gallops.  ABDOMEN: Soft, nontender, nondistended. Bowel sounds present. No organomegaly or mass.  EXTREMITIES: No pedal edema, cyanosis, or clubbing.  No erythema or swelling on the right foot.  Tenderness on palpation. NEUROLOGIC: Cranial nerves II through XII are intact. Muscle strength 5/5 in all extremities. Sensation intact. Gait not  checked.  PSYCHIATRIC: The patient is alert and oriented x 3.  SKIN: No obvious rash, lesion, or ulcer.   Physical Exam LABORATORY PANEL:   CBC Recent Labs  Lab 05/16/18 0505  WBC 11.9*  HGB 12.0*  HCT 36.8*  PLT 288   ------------------------------------------------------------------------------------------------------------------  Chemistries  Recent Labs  Lab 05/13/18 1051  05/15/18 0314  NA 134*   < > 135  K 3.2*   < > 3.5  CL 95*   < > 98  CO2 27   < > 28  GLUCOSE 244*   < > 277*  BUN 11   < > 19  CREATININE 0.99   < > 1.05  CALCIUM 8.5*   < > 7.9*  AST 47*  --   --   ALT 51*  --   --   ALKPHOS 117  --   --   BILITOT 0.7  --   --    < > = values in this interval not displayed.   ------------------------------------------------------------------------------------------------------------------  Cardiac Enzymes Recent Labs  Lab 05/13/18 2008 05/14/18 0151  TROPONINI 0.22* 0.22*   ------------------------------------------------------------------------------------------------------------------  RADIOLOGY:  No results found.  ASSESSMENT AND PLAN:  57 year old male patient with history of CAD, CABG, diabetes mellitus type 2 complains of midsternal chest pressure since morning associated with diaphoresis, found to have A. fib with RVR on arrival  *A. fib with RVR Rate controlled. Continue heparin drip, beta-blocker therapy, Xarelto was discontinued with plans for heart catheterization tomorrow.  *Acute chest pain Resolved Likely secondary to above Troponins inconsistent for acute coronary syndrome History of coronary artery disease status post CABG in 2015, bioprosthetic cardiac valve For heart catheterization on Monday for further evaluation, cardiology  following, continue heparin drip for 48 hours, beta-blocker therapy, statin therapy, aspirin, nitroglycerin, IV morphine PRN breakthrough pain  *history of papillary thyroid carcinoma status post surgery  in 2015 With postoperative hypothyroidism continue thyroid supplementation   *chronic systolic heart failure  EF 45 to 50% by echocardiogram as [facility]  *Acute hypokalemia Repleted and improved.  *Acute hyperglycemia due to diabetes T2, new diagnosis.. ?  Secondary to stress response versus diabetes  Hmoglobin A1c 9.3, on sliding scale insulin with Accu-Cheks per routine, carbohydrate consistent diet/heart healthy, Accu-Cheks per routine   *Acute headache Topamax PRN  Morbid obesity.  Advised diet control and follow-up PCP as outpatient.  Discharge to home after cardiac cath on Monday with cardiology clearance barring any complications  All the records are reviewed and case discussed with Care Management/Social Workerr. Management plans discussed with the patient, family and they are in agreement.  CODE STATUS: full  TOTAL TIME TAKING CARE OF THIS PATIENT: 30 minutes.    POSSIBLE D/C IN 1-2 DAYS, DEPENDING ON CLINICAL CONDITION.   Shaune PollackQing Elizet Kaplan M.D on 05/16/2018   Between 7am to 6pm - Pager - [phone]  After 6pm go to [email or web address] - Social research officer, governmentpassword EPAS [facility]  [facility] [place] [facility]  Office  [phone]  CC: Primary care physician; [facility]  Note: This dictation was prepared with Nurse, children'sDragon dictation along with smaller phrase technology. Any transcriptional errors that result from this process are unintentional.

## 2018-05-17 ENCOUNTER — Encounter
Admission: EM | Disposition: A | Discharge status: Home / Self Care | Payer: Self-pay | Source: Home / Self Care | Attending: Internal Medicine

## 2018-05-17 ENCOUNTER — Inpatient Hospital Stay: Payer: Self-pay

## 2018-05-17 HISTORY — PX: LEFT HEART CATH AND CORS/GRAFTS ANGIOGRAPHY: CATH118250

## 2018-05-17 LAB — CBC
HEMATOCRIT: 37 % — AB (ref 39.0–52.0)
Hemoglobin: 11.9 g/dL — ABNORMAL LOW (ref 13.0–17.0)
MCH: 27.7 pg (ref 26.0–34.0)
MCHC: 32.2 g/dL (ref 30.0–36.0)
MCV: 86 fL (ref 80.0–100.0)
Platelets: 279 10*3/uL (ref 150–400)
RBC: 4.3 MIL/uL (ref 4.22–5.81)
RDW: 13.3 % (ref 11.5–15.5)
WBC: 12.9 10*3/uL — ABNORMAL HIGH (ref 4.0–10.5)
nRBC: 0 % (ref 0.0–0.2)

## 2018-05-17 LAB — GLUCOSE, CAPILLARY
Glucose-Capillary: 168 mg/dL — ABNORMAL HIGH (ref 70–99)
Glucose-Capillary: 172 mg/dL — ABNORMAL HIGH (ref 70–99)
Glucose-Capillary: 171 mg/dL — ABNORMAL HIGH (ref 70–99)
Glucose-Capillary: 176 mg/dL — ABNORMAL HIGH (ref 70–99)
Glucose-Capillary: 185 mg/dL — ABNORMAL HIGH (ref 70–99)

## 2018-05-17 LAB — HEPARIN LEVEL (UNFRACTIONATED): Heparin Unfractionated: 0.23 IU/mL — ABNORMAL LOW (ref 0.30–0.70)

## 2018-05-17 SURGERY — LEFT HEART CATH AND CORS/GRAFTS ANGIOGRAPHY
Anesthesia: Moderate Sedation

## 2018-05-17 MED ORDER — SODIUM CHLORIDE 0.9 % IV SOLN: 250.0000 mL | INTRAVENOUS | Status: DC | PRN

## 2018-05-17 MED ORDER — SODIUM CHLORIDE 0.9% FLUSH
3.0000 mL | Freq: Two times a day (BID) | INTRAVENOUS | Status: DC
  Administered 2018-05-17 – 2018-05-18 (×2): 3 mL via INTRAVENOUS

## 2018-05-17 MED ORDER — MIDAZOLAM HCL 2 MG/2ML IJ SOLN
INTRAMUSCULAR | Status: AC
  Filled 2018-05-17: qty 2

## 2018-05-17 MED ORDER — HEPARIN BOLUS VIA INFUSION
1500.0000 [IU] | Freq: Once | INTRAVENOUS | Status: AC
  Administered 2018-05-17: 1500 [IU] via INTRAVENOUS
  Filled 2018-05-17: qty 1500

## 2018-05-17 MED ORDER — SODIUM CHLORIDE 0.9% FLUSH: 3.0000 mL | INTRAVENOUS | Status: DC | PRN

## 2018-05-17 MED ORDER — FENTANYL CITRATE (PF) 100 MCG/2ML IJ SOLN
INTRAMUSCULAR | Status: AC
  Filled 2018-05-17: qty 2

## 2018-05-17 MED ORDER — GLIMEPIRIDE 2 MG PO TABS: 2.0000 mg | ORAL_TABLET | Freq: Every day | ORAL | 1 refills | Status: DC

## 2018-05-17 MED ORDER — IOPAMIDOL (ISOVUE-300) INJECTION 61%
INTRAVENOUS | Status: DC | PRN
  Administered 2018-05-17: 140 mL via INTRA_ARTERIAL

## 2018-05-17 MED ORDER — GLIMEPIRIDE 2 MG PO TABS
2.0000 mg | ORAL_TABLET | Freq: Every day | ORAL | Status: DC
  Filled 2018-05-17 (×2): qty 1

## 2018-05-17 MED ORDER — LABETALOL HCL 5 MG/ML IV SOLN
INTRAVENOUS | Status: AC
  Filled 2018-05-17: qty 4

## 2018-05-17 MED ORDER — LABETALOL HCL 5 MG/ML IV SOLN
INTRAVENOUS | Status: DC | PRN
  Administered 2018-05-17 (×2): 10 mg via INTRAVENOUS

## 2018-05-17 MED ORDER — SODIUM CHLORIDE 0.9 % WEIGHT BASED INFUSION: 1.0000 mL/kg/h | INTRAVENOUS | Status: DC

## 2018-05-17 MED ORDER — ACETAMINOPHEN 325 MG PO TABS
ORAL_TABLET | ORAL | Status: AC
  Administered 2018-05-17: 16:00:00
  Filled 2018-05-17: qty 2

## 2018-05-17 MED ORDER — HEPARIN (PORCINE) IN NACL 1000-0.9 UT/500ML-% IV SOLN
INTRAVENOUS | Status: AC
  Filled 2018-05-17: qty 1000

## 2018-05-17 MED ORDER — ONDANSETRON HCL 4 MG/2ML IJ SOLN: 4.0000 mg | Freq: Four times a day (QID) | INTRAMUSCULAR | Status: DC | PRN

## 2018-05-17 MED ORDER — MIDAZOLAM HCL 2 MG/2ML IJ SOLN
INTRAMUSCULAR | Status: DC | PRN
  Administered 2018-05-17: 1 mg via INTRAVENOUS

## 2018-05-17 MED ORDER — FENTANYL CITRATE (PF) 100 MCG/2ML IJ SOLN
INTRAMUSCULAR | Status: DC | PRN
  Administered 2018-05-17 (×2): 25 ug via INTRAVENOUS

## 2018-05-17 MED ORDER — LIVING WELL WITH DIABETES BOOK - IN SPANISH
Freq: Once | Status: AC
  Administered 2018-05-17: 16:00:00
  Filled 2018-05-17: qty 1

## 2018-05-17 MED ORDER — ACETAMINOPHEN 325 MG PO TABS
650.0000 mg | ORAL_TABLET | ORAL | Status: DC | PRN
  Filled 2018-05-17: qty 2

## 2018-05-17 MED ORDER — METFORMIN HCL 500 MG PO TABS: 1000.0000 mg | ORAL_TABLET | Freq: Two times a day (BID) | ORAL | 2 refills | Status: DC

## 2018-05-17 SURGICAL SUPPLY — 11 items
CATH INFINITI 5 FR MPA2 (CATHETERS) ×3 IMPLANT
CATH INFINITI 5FR ANG PIGTAIL (CATHETERS) ×3 IMPLANT
CATH INFINITI 5FR JL4 (CATHETERS) ×3 IMPLANT
CATH INFINITI 5FR JL5 (CATHETERS) ×3 IMPLANT
CATH INFINITI JR4 5F (CATHETERS) ×3 IMPLANT
DEVICE CLOSURE MYNXGRIP 5F (Vascular Products) ×3 IMPLANT
KIT MANI 3VAL PERCEP (MISCELLANEOUS) ×3 IMPLANT
NEEDLE PERC 18GX7CM (NEEDLE) ×3 IMPLANT
PACK CARDIAC CATH (CUSTOM PROCEDURE TRAY) ×3 IMPLANT
SHEATH AVANTI 5FR X 11CM (SHEATH) ×3 IMPLANT
WIRE GUIDERIGHT .035X150 (WIRE) ×3 IMPLANT

## 2018-05-17 NOTE — Progress Notes (Signed)
[employer] - [place] [facility]   PATIENT NAME: Craig PernaJose Reese    MR#:  [id number]  DATE OF BIRTH:  April 11, 1961  SUBJECTIVE:  Patient complains of right foot pain.  Status post cardiac cath just now. REVIEW OF SYSTEMS:  CONSTITUTIONAL: No fever, fatigue or weakness.  EYES: No blurred or double vision.  EARS, NOSE, AND THROAT: No tinnitus or ear pain.  RESPIRATORY: No cough, shortness of breath, wheezing or hemoptysis.  CARDIOVASCULAR: No chest pain, orthopnea, edema.  GASTROINTESTINAL: No nausea, vomiting, diarrhea or abdominal pain.  GENITOURINARY: No dysuria, hematuria.  ENDOCRINE: No polyuria, nocturia,  HEMATOLOGY: No anemia, easy bruising or bleeding SKIN: No rash or lesion. MUSCULOSKELETAL: No joint pain or arthritis.   right foot pain. NEUROLOGIC: No tingling, numbness, weakness.  PSYCHIATRY: No anxiety or depression.   ROS  DRUG ALLERGIES:  No Known Allergies  VITALS:  Blood pressure (!) 148/91, pulse 72, temperature 98.2 F (36.8 C), resp. rate 18, height 5\' 10"  (1.778 m), weight 123.2 kg, SpO2 96 %.  PHYSICAL EXAMINATION:  GENERAL:  57 y.o.-year-old patient lying in the bed with no acute distress.  EYES: Pupils equal, round, reactive to light and accommodation. No scleral icterus. Extraocular muscles intact.  HEENT: Head atraumatic, normocephalic. Oropharynx and nasopharynx clear.  NECK:  Supple, no jugular venous distention. No thyroid enlargement, no tenderness.  LUNGS: Normal breath sounds bilaterally, no wheezing, rales,rhonchi or crepitation. No use of accessory muscles of respiration.  CARDIOVASCULAR: S1, S2 normal. No murmurs, rubs, or gallops.  ABDOMEN: Soft, nontender, nondistended. Bowel sounds present. No organomegaly or mass.  EXTREMITIES: No pedal edema, cyanosis, or clubbing.  No erythema or swelling on the right foot.  Tenderness on palpation. NEUROLOGIC: Cranial nerves II through XII are intact. Muscle strength 5/5 in all  extremities. Sensation intact. Gait not checked.  PSYCHIATRIC: The patient is alert and oriented x 3.  SKIN: No obvious rash, lesion, or ulcer.   Physical Exam LABORATORY PANEL:   CBC Recent Labs  Lab 05/17/18 0314  WBC 12.9*  HGB 11.9*  HCT 37.0*  PLT 279   ------------------------------------------------------------------------------------------------------------------  Chemistries  Recent Labs  Lab 05/13/18 1051  05/15/18 0314  NA 134*   < > 135  K 3.2*   < > 3.5  CL 95*   < > 98  CO2 27   < > 28  GLUCOSE 244*   < > 277*  BUN 11   < > 19  CREATININE 0.99   < > 1.05  CALCIUM 8.5*   < > 7.9*  AST 47*  --   --   ALT 51*  --   --   ALKPHOS 117  --   --   BILITOT 0.7  --   --    < > = values in this interval not displayed.   ------------------------------------------------------------------------------------------------------------------  Cardiac Enzymes Recent Labs  Lab 05/13/18 2008 05/14/18 0151  TROPONINI 0.22* 0.22*   ------------------------------------------------------------------------------------------------------------------  RADIOLOGY:  No results found.  ASSESSMENT AND PLAN:  57 year old male patient with history of CAD, CABG, diabetes mellitus type 2 complains of midsternal chest pressure since morning associated with diaphoresis, found to have A. fib with RVR on arrival  *A. fib with RVR Rate controlled. He is treated with heparin drip, beta-blocker therapy, Xarelto was discontinued with plans for heart catheterization.  *Acute chest pain Resolved Likely secondary to above Troponins inconsistent for acute coronary syndrome History of coronary artery disease status post CABG in 2015, bioprosthetic cardiac valve He  is on heparin drip, beta-blocker therapy, statin therapy, aspirin, nitroglycerin, IV morphine PRN breakthrough pain. Status posterior cardiac cath just now, pending report.  *history of papillary thyroid carcinoma status post  surgery in 2015 With postoperative hypothyroidism continue thyroid supplementation   *chronic systolic heart failure  EF 45 to 50% by echocardiogram [facility]  *Acute hypokalemia Repleted and improved.  *Acute hyperglycemia due to diabetes T2, new diagnosis.. ?  Secondary to stress response versus diabetes  Hmoglobin A1c 9.3, on sliding scale insulin with Accu-Cheks per routine, carbohydrate consistent diet/heart healthy, Accu-Cheks per routine   *Acute headache Topamax PRN  Right foot pain.  Controlled.  X-ray and uric acid for Dr. Orland Jarredroxler.  Morbid obesity.  Advised diet control and follow-up PCP as outpatient.  I discussed with Dr. Orland Jarredroxler. Discharge to home after cardiac cath on Monday with cardiology clearance barring any complications  All the records are reviewed and case discussed with Care Management/Social Workerr. Management plans discussed with the patient, family and they are in agreement.  CODE STATUS: full  TOTAL TIME TAKING CARE OF THIS PATIENT: 28 minutes.    POSSIBLE D/C IN 1-2 DAYS, DEPENDING ON CLINICAL CONDITION.   Shaune PollackQing Merida Alcantar M.D on 05/17/2018   Between 7am to 6pm - Pager - [phone]  After 6pm go to [email or web address] - Social research officer, governmentpassword [email or web address] [email or web address]  [facility]  Office  [phone]  CC: Primary care physician; [facility]  Note: This dictation was prepared with Nurse, children'sDragon dictation along with smaller phrase technology. Any transcriptional errors that result from this process are unintentional.

## 2018-05-17 NOTE — Consult Note (Signed)
ANTICOAGULATION CONSULT NOTE  Pharmacy Consult for heparin drip management Indication: chest pain/ACS  No Known Allergies  Patient Measurements: Height: 5\' 10"  (177.8 cm) Weight: 273 lb 3.2 oz (123.9 kg) IBW/kg (Calculated) : 73 Heparin Dosing Weight: 99 kg  Vital Signs: Temp: 98.5 F (36.9 C) (12/29 1931) Temp Source: Oral (12/29 1931) BP: 142/82 (12/29 1933) Pulse Rate: 75 (12/29 1933)  Labs: Recent Labs    05/14/18 0605  05/15/18 0314  05/15/18 1921 05/16/18 0505 05/17/18 0314  HGB 11.2*  --  11.5*  --   --  12.0* 11.9*  HCT 34.4*  --  35.0*  --   --  36.8* 37.0*  PLT 259  --  271  --   --  288 279  APTT 58*  --   --   --   --   --   --   HEPARINUNFRC 0.28*   < > 0.29*   < > 0.48 0.47 0.23*  CREATININE 0.97  --  1.05  --   --   --   --    < > = values in this interval not displayed.    Estimated Creatinine Clearance: 105 mL/min (by C-G formula based on SCr of 1.05 mg/dL).    Assessment: 57 y.o. male with a past medical history of CAD, CABG, hypothyroidism, hypertension, diabetes, taking Xarelto presents to the emergency department for chest pain. His last dose of Xarelto was 12/25 around 2100 per the patient as told through an interpreter. Initial troponin is elevated. Baseline labs have been ordered.  Heparin course 12/26 Start heparin infusion at 1350 units/hr; bolus is being withheld due to the proximity of last Xarelto dose  12/26 2146  aPTT 44, HL <0.10. Increase to 1450 units/hr and recheck in 6 hours.  12/27 0605 aPTT 58, HL 0.28 - both are low. RN confirms Heparin drip running at 14.5 ml/hr; no s/sx of bleeding noted, nothing in report from night shift. Will bolus heparin 1500 units IV x1 and increase heparin drip to 1600 units/h (=16 ml/hr). Will dose based on HL as initial baseline HL was <0.10 and both are low this check. Next HL 6h after rate change. CBC in AM. Continue to monitor H&H and platelets. Also asked RN to continue monitoring for s/sx of  bleeding.   12/27 15:03 HL 0.45, continue current rate  12/27 20:48 HL 0.36, 2nd therapeutic level  12/28 03:14 HL 0.29  12/28 10:58 HL 0.54  12/29 05:05 HL 0.47  Goal of Therapy:  Heparin level 0.3-0.7 units/ml APTT 66-102s Monitor platelets by anticoagulation protocol: Yes   Plan:  12/30 @ 0300 HL 0.23 subtherapeutic. Will rebolus w/ heparin 1500 units IV x 1 and increase rate to 1950 units/hr and will recheck HL @ 1000, CBC stable will continue to monitor.  Thomasene Rippleavid Sueann Brownley, PharmD, BCPS Clinical Pharmacist 05/17/2018

## 2018-05-17 NOTE — OR Nursing (Signed)
Pt reports right foot pain, acetaminophen given. Pt reports pain spontaneously developed yesterday and MD aware of it.

## 2018-05-17 NOTE — OR Nursing (Signed)
Consent and pt instruction given using spanish interpretor

## 2018-05-17 NOTE — Progress Notes (Signed)
Inpatient Diabetes Program Recommendations  AACE/ADA: New Consensus Statement on Inpatient Glycemic Control (2019)  Target Ranges:  Prepandial:   less than 140 mg/dL      Peak postprandial:   less than 180 mg/dL (1-2 hours)      Critically ill patients:  140 - 180 mg/dL   Results for Craig Reese, Craig Reese (MRN [id number]) as of 05/17/2018 08:28  Ref. Range 05/16/2018 08:21 05/16/2018 12:33 05/16/2018 17:27 05/16/2018 21:09 05/17/2018 07:45 05/17/2018 08:05  Glucose-Capillary Latest Ref Range: 70 - 99 mg/dL 409224 (H) 811258 (H) 914187 (H) 296 (H) 168 (H) 172 (H)  Results for Craig Reese, Craig Reese (MRN [id number]) as of 05/17/2018 08:28  Ref. Range 05/15/2018 19:21  Hemoglobin A1C Latest Ref Range: 4.8 - 5.6 % 9.3 (H)   Review of Glycemic Control  Diabetes history: DM2 Outpatient Diabetes medications: Metformin 500 mg BID Current orders for Inpatient glycemic control: Novolog 0-20 units TID with meals, Novolog 0-5 units QHS  Inpatient Diabetes Program Recommendations:   Oral Agents: Please consider ordering Amaryl 2 mg daily.  HgbA1C: A1C 9.3% on 05/15/18 indicating an average glucose of 220 mg/dl. Noted patient has no insurance and [facility]; therefore will need affordable DM medications.  Recommend discharging patient on Metformin 1000 mg BID and Amaryl 2 mg daily.  Thanks, Orlando PennerMarie Able Malloy, RN, MSN, CDE Diabetes Coordinator Inpatient Diabetes Program [phone] (Team Pager from 8am to 5pm)

## 2018-05-17 NOTE — Consult Note (Signed)
[facility]                                                      Patient Demographics  Craig Reese, is a 57 y.o. male   MRN: [id number]   DOB - September 09, 1960  Admit Date - 05/13/2018    Outpatient Primary MD for the patient is [facility] requested in the [facility] by Demetrios Loll, MD, On 05/17/2018    Reason for consult right foot and ankle pain   With History of -  Past Medical History:  Diagnosis Date  . Coronary artery disease       Past Surgical History:  Procedure Laterality Date  . CORONARY ARTERY BYPASS GRAFT      in for   Chief Complaint  Patient presents with  . Chest Pain     HPI  Craig Reese  is a 57 y.o. male, I was asked see the patient by Dr. Bridgett Larsson.  Do not have interpreter present at this point but I was able to ask him where his pain is any pointing to the top of his foot and the anterior ankle. I will see him tomorrow again after he gets x-rays and have an interpreter present.  Patient does have diabetes but denies any history of injury to the foot.  Denies any ulcers or open wounds.  Was admitted for some chest pressure and A. fib   Review of Systems    In addition to the HPI above,  No Fever-chills, No Headache, No changes with Vision or hearing, No problems swallowing food or Liquids, No Chest pain, Cough or Shortness of Breath, No Abdominal pain, No Nausea or Vommitting, Bowel movements are regular, No Blood in stool or Urine, No dysuria, No new skin rashes or bruises, Right foot pain and right ankle pain. No new weakness, tingling, numbness in any extremity, No recent weight gain or loss, No polyuria, polydypsia or polyphagia, No significant Mental Stressors.  A full 10 point Review of Systems was done, except as stated above, all other Review of Systems were  negative.   Social History Social History   Tobacco Use  . Smoking status: Never Smoker  . Smokeless tobacco: Never Used  Substance Use Topics  . Alcohol use: Not Currently    Family History History reviewed. No pertinent family history.  Prior to Admission medications   Medication Sig Start Date End Date Taking? Authorizing Provider  gabapentin (NEURONTIN) 300 MG capsule Take 1 capsule by mouth 2 (two) times daily. 10/07/17 12/23/18 Yes [provider]  hydrALAZINE (APRESOLINE) 25 MG tablet Take 1 tablet by mouth 3 (three) times daily. 12/23/17  Yes [provider]  levothyroxine (SYNTHROID) 150 MCG tablet Take 1 tablet by mouth daily. 06/16/17 12/23/18 Yes [provider]  omeprazole (PRILOSEC) 20 MG capsule Take 2 capsules by mouth daily. 06/16/17 12/23/18 Yes [provider]  rivaroxaban (XARELTO) 20 MG TABS tablet Take 1 tablet by mouth daily. 05/09/15 07/05/18 Yes [provider]  atorvastatin (LIPITOR) 40 MG tablet Take 1 tablet by mouth daily. 06/16/17 06/16/18  [provider]  furosemide (LASIX) 40 MG tablet Take 1 tablet by mouth daily. 11/07/16 06/16/18  [provider]  glimepiride (AMARYL) 2 MG tablet Take 1 tablet (2 mg total) by mouth daily with breakfast.  05/18/18   Demetrios Loll, MD  metFORMIN (GLUCOPHAGE) 500 MG tablet Take 2 tablets (1,000 mg total) by mouth 2 (two) times daily. 05/17/18 11/23/19  Demetrios Loll, MD  metoprolol tartrate (LOPRESSOR) 50 MG tablet Take 1 tablet by mouth 2 (two) times daily. 11/07/16 06/16/18  [provider]  potassium chloride (K-DUR) 10 MEQ tablet Take 1 tablet by mouth daily. 06/16/17 06/16/18  [provider]    Anti-infectives (From admission, onward)   None      Scheduled Meds: . aspirin  81 mg Oral Daily  . atorvastatin  40 mg Oral QPM  . docusate sodium  100 mg Oral BID  . furosemide  40 mg Oral Daily  . gabapentin  300 mg Oral BID  . [START ON 05/18/2018]  glimepiride  2 mg Oral Q breakfast  . hydrALAZINE  25 mg Oral TID  . insulin aspart  0-20 Units Subcutaneous TID WC  . insulin aspart  0-5 Units Subcutaneous QHS  . levothyroxine  150 mcg Oral [id number]  . metoprolol tartrate  50 mg Oral BID  . pantoprazole  40 mg Oral Daily  . potassium chloride SA  10 mEq Oral Daily  . [START ON 05/18/2018] sodium chloride flush  3 mL Intravenous Q12H   Continuous Infusions: . [START ON 05/18/2018] sodium chloride    . sodium chloride 1 mL/kg/hr (05/17/18 1618)   PRN Meds:.[START ON 05/18/2018] sodium chloride, acetaminophen **OR** acetaminophen, acetaminophen, bisacodyl, morphine injection, nitroGLYCERIN, ondansetron **OR** ondansetron (ZOFRAN) IV, ondansetron (ZOFRAN) IV, oxyCODONE-acetaminophen, [START ON 05/18/2018] sodium chloride flush  No Known Allergies  Physical Exam  Vitals  Blood pressure (!) 148/91, pulse 72, temperature 98.2 F (36.8 C), resp. rate 18, height _0  (1.778 m), weight 123.2 kg, SpO2 96 %.  Lower Extremity exam:  Vascular: +2/4 bilateral DP and PT pulses  Dermatological: No ulcers or rashes are noted.  Neurological: He denies complete numbness and he has pain with palpation the dorsum of his right foot.  Ortho: Pain with palpation to the dorsum of the foot at the tarsometatarsal and naviculocuneiform regions.  Not a lot of redness or swelling to the region but there is a little fullness at the second third met cuneiform regions which could be associated with some underlying arthritis.  White count was elevated when he came in but there is no open wounds or infection going on.  He does have a history of diabetes.  Data Review  CBC Recent Labs  Lab 05/13/18 1051 05/14/18 0605 05/15/18 0314 05/16/18 0505 05/17/18 0314  WBC 11.6* 9.4 9.0 11.9* 12.9*  HGB 13.7 11.2* 11.5* 12.0* 11.9*  HCT 41.3 34.4* 35.0* 36.8* 37.0*  PLT 303 259 271 288 279  MCV 85.0 85.8 85.8 85.8 86.0  MCH 28.2 27.9 28.2 28.0 27.7  MCHC 33.2  32.6 32.9 32.6 32.2  RDW 13.3 13.5 13.3 13.5 13.3   ------------------------------------------------------------------------------------------------------------------  Chemistries  Recent Labs  Lab 05/13/18 1051 05/14/18 0605 05/15/18 0314  NA 134* 135 135  K 3.2* 3.2* 3.5  CL 95* 99 98  CO2 _1 GLUCOSE 244* 219* 277*  BUN _2 CREATININE 0.99 0.97 1.05  CALCIUM 8.5* 7.7* 7.9*  AST 47*  --   --   ALT 51*  --   --   ALKPHOS 117  --   --   BILITOT 0.7  --   --    ---------- Assessment & Plan: As noted I will need to see the patient with  an interpreter but he does not appear to have any type of skin lesions or sores or open problems associated with diabetes.  This could be his underlying arthritis.  I did order foot and ankle x-rays I can review those when I see him again tomorrow.  Does not appear to have any significant inflammation which would suggest gout but there could be an underlying problem from that standpoint as well. Active Problems:   Chest pain due to CAD [facility])   Family Communication: Plan discussed with patient and Dr. Lajuana Matte M.D on 05/17/2018 at 5:26 PM  Thank you for the consult, we will follow the patient with you in the Hospital.

## 2018-05-18 ENCOUNTER — Inpatient Hospital Stay: Payer: Self-pay

## 2018-05-18 ENCOUNTER — Encounter: Payer: Self-pay | Admitting: Internal Medicine

## 2018-05-18 LAB — URIC ACID: Uric Acid, Serum: 8 mg/dL (ref 3.7–8.6)

## 2018-05-18 LAB — GLUCOSE, CAPILLARY
Glucose-Capillary: 179 mg/dL — ABNORMAL HIGH (ref 70–99)
Glucose-Capillary: 177 mg/dL — ABNORMAL HIGH (ref 70–99)

## 2018-05-18 LAB — BASIC METABOLIC PANEL
Anion gap: 11 (ref 5–15)
BUN: 16 mg/dL (ref 6–20)
CO2: 25 mmol/L (ref 22–32)
Calcium: 8.4 mg/dL — ABNORMAL LOW (ref 8.9–10.3)
Chloride: 100 mmol/L (ref 98–111)
Creatinine, Ser: 1.01 mg/dL (ref 0.61–1.24)
GFR calc non Af Amer: >60 mL/min (ref >60)
Glucose, Bld: 198 mg/dL — ABNORMAL HIGH (ref 70–99)
Potassium: 3.6 mmol/L (ref 3.5–5.1)
Sodium: 136 mmol/L (ref 135–145)

## 2018-05-18 LAB — CBC
HEMATOCRIT: 36.1 % — AB (ref 39.0–52.0)
Hemoglobin: 11.9 g/dL — ABNORMAL LOW (ref 13.0–17.0)
MCH: 28.1 pg (ref 26.0–34.0)
MCHC: 33 g/dL (ref 30.0–36.0)
MCV: 85.3 fL (ref 80.0–100.0)
NRBC: 0 % (ref 0.0–0.2)
Platelets: 267 10*3/uL (ref 150–400)
RBC: 4.23 MIL/uL (ref 4.22–5.81)
RDW: 13.6 % (ref 11.5–15.5)
WBC: 11.6 10*3/uL — ABNORMAL HIGH (ref 4.0–10.5)

## 2018-05-18 MED ORDER — RIVAROXABAN 20 MG PO TABS: 20.0000 mg | ORAL_TABLET | Freq: Every day | ORAL | Status: DC

## 2018-05-18 NOTE — Progress Notes (Signed)
Kerin PernaJose Luedke to be D/C'd Home per MD order.  Discussed prescriptions and follow up appointments with the patient. Prescriptions given to patient, medication list explained in detail. Pt verbalized understanding.  Allergies as of 05/18/2018   No Known Allergies     Medication List    TAKE these medications   atorvastatin 40 MG tablet Commonly known as:  LIPITOR Take 1 tablet by mouth daily.   furosemide 40 MG tablet Commonly known as:  LASIX Take 1 tablet by mouth daily.   gabapentin 300 MG capsule Commonly known as:  NEURONTIN Take 1 capsule by mouth 2 (two) times daily.   glimepiride 2 MG tablet Commonly known as:  AMARYL Take 1 tablet (2 mg total) by mouth daily with breakfast.   hydrALAZINE 25 MG tablet Commonly known as:  APRESOLINE Take 1 tablet by mouth 3 (three) times daily.   metFORMIN 500 MG tablet Commonly known as:  GLUCOPHAGE Take 2 tablets (1,000 mg total) by mouth 2 (two) times daily. What changed:  how much to take   metoprolol tartrate 50 MG tablet Commonly known as:  LOPRESSOR Take 1 tablet by mouth 2 (two) times daily.   omeprazole 20 MG capsule Commonly known as:  PRILOSEC Take 2 capsules by mouth daily.   potassium chloride 10 MEQ tablet Commonly known as:  K-DUR Take 1 tablet by mouth daily.   rivaroxaban 20 MG Tabs tablet Commonly known as:  XARELTO Take 1 tablet by mouth daily.   SYNTHROID 150 MCG tablet Generic drug:  levothyroxine Take 1 tablet by mouth daily.       Vitals:   05/18/18 0420 05/18/18 0820  BP: (!) 148/89 (!) 151/94  Pulse: 75 75  Resp:    Temp: 97.9 F (36.6 C) 97.6 F (36.4 C)  SpO2: 98% 97%    Tele box removed and returned. Skin clean, dry and intact without evidence of skin break down, no evidence of skin tears noted. IV catheter discontinued intact. Site without signs and symptoms of complications. Dressing and pressure applied. Pt denies pain at this time. No complaints noted.  An After Visit Summary  was printed and given to the patient. Patient escorted via WC, and D/C home via private auto.  Rigoberto NoelErica Y Telly Jawad

## 2018-05-18 NOTE — Plan of Care (Signed)
Nutrition Education Note   RD consulted for nutrition education regarding diabetes.  57 year old male patient with history of CAD, CABG, diabetes mellitus type 2 complains of midsternal chest pressure associated with diaphoresis, found to have A. fib with RVR   Met with pt in room today along with spanish interpreter.   Lab Results  Component Value Date   HGBA1C 9.3 (H) 05/15/2018    RD provided "Nutrition and Type II Diabetes" handout in spanish from the Academy of [employer] and Dietetics. Discussed different food groups and their effects on blood sugar, emphasizing carbohydrate-containing foods. Provided list of carbohydrates and recommended serving sizes of common foods.  Discussed importance of controlled and consistent carbohydrate intake throughout the day. Provided examples of ways to balance meals/snacks and encouraged intake of high-fiber, whole grain complex carbohydrates. Teach back method used.  Expect fair to poor compliance.  Body mass index is 39 kg/m. Pt meets criteria for obesity based on current BMI.  Current diet order is CHO modified, patient is consuming approximately 100% of meals at this time. Labs and medications reviewed. No further nutrition interventions warranted at this time. RD contact information provided. If additional nutrition issues arise, please re-consult RD.  Koleen Distance MS, RD, LDN Pager #- [phone] Office#- [phone] After Hours Pager: [phone]

## 2018-05-18 NOTE — Care Management Note (Signed)
Case Management Note  Patient Details  Name: Craig Reese MRN: [id number] Date of Birth: July 04, 1960  Subjective/Objective:        Patient is discharging today to home with brother.  [facility] and [facility] [phone].  Will fax prescriptions there prior to discharge.  He is unemployed and uninsured.   Denies difficulties obtaining medications or with medical care.  Independent in all ADL's.  No further needs identified at this time.           Action/Plan:   Expected Discharge Date:  05/18/18               Expected Discharge Plan:  Home/Self Care  In-House Referral:     Discharge planning Services  CM Consult  Post Acute Care Choice:    Choice offered to:     DME Arranged:    DME Agency:     HH Arranged:    HH Agency:     Status of Service:  Completed, signed off  If discussed at [employer] Length of Stay Meetings, dates discussed:    Additional Comments:  Craig KernsJennifer L Hollace Michelli, RN 05/18/2018, 11:21 AM

## 2018-05-18 NOTE — Progress Notes (Signed)
Inpatient Diabetes Program Recommendations  AACE/ADA: New Consensus Statement on Inpatient Glycemic Control (2019)  Target Ranges:  Prepandial:   less than 140 mg/dL      Peak postprandial:   less than 180 mg/dL (1-2 hours)      Critically ill patients:  140 - 180 mg/dL   Results for Craig Reese, Brace (MRN [id number]) as of 05/18/2018 10:53  Ref. Range 05/17/2018 08:05 05/17/2018 13:31 05/17/2018 16:05 05/17/2018 21:30 05/18/2018 08:20  Glucose-Capillary Latest Ref Range: 70 - 99 mg/dL 409172 (H) 811171 (H) 914176 (H) 185 (H) 179 (H)   Review of Glycemic Control  Diabetes history: DM2 Outpatient Diabetes medications: Metformin 500 mg BID Current orders for Inpatient glycemic control: Amaryl 2 mg daily,  Novolog 0-20 units TID with meals, Novolog 0-5 units QHS  Inpatient Diabetes Program Recommendations:   HgbA1C: A1C 9.3% on 05/15/18 indicating an average glucose of 220 mg/dl. Noted patient has no insurance and [facility]; therefore will need affordable DM medications.  Recommend discharging patient on Metformin 1000 mg BID and Amaryl 2 mg daily.  NOTE: Spoke with patient (using onsite interpreter Jacqui) about diabetes and home regimen for diabetes control. Patient reports that he is followed by [facility] for diabetes management and currently he takes Metformin 500 mg BID as an outpatient for diabetes control. Patient reports that he is taking Metformin consistently as prescribed.   Patient states that he checks his glucose 1 time per day in the morning before breakfst and that it is usually 140-200 mg/dl. Patient states that he has checked it from time to time before bedtime and it has been as high as 270 mg/dl.   Inquired about knowledge about an A1C and patient reports that he does not know what A1C is. Explained what an A1C is and discussed A1C results (9.3% on 05/15/18) and explained that his current A1C indicates an average glucose of 220 mg/dl over the past 2-3 months.  Discussed glucose and A1C goals. Discussed importance of checking CBGs and maintaining good CBG control to prevent long-term and short-term complications. Explained how hyperglycemia leads to damage within blood vessels which lead to the common complications seen with uncontrolled diabetes. Stressed to the patient the importance of improving glycemic control to prevent further complications from uncontrolled diabetes. Discussed impact of nutrition, exercise, stress, sickness, and medications on diabetes control. Patient states that he tries to stay away from sugar. However, in talking with patient further he was not aware that main food staples (tortillas, beans, rice, and corn flakes) had carbohydrates which turn into glucose and contribute to hyperglycemia. Discussed carbohydrates, carbohydrate goals per day and meal, along with portion sizes. Patient notes that he stays with several family members and he eats what they provide for him and he notes that he will need to educate his family on what he should and should not eat. Patient notes that he mainly drinks water. Informed patient that a consult for RD would be ordered for further diet education.   Discussed Metformin and how it works for DM control. Also discussed Amaryl and explained how it works for DM control. Discussed risk of hypoglycemia with Amaryl as well along with proper treatment. [facility] for hypoglycemia treatment if needed.  Encouraged patient to check his glucose 1+2 times per day and to keep a log book of glucose readings which he will need to take to doctor appointments. Explained how the doctor he follows up  with can use the log book to continue to make DM medication adjustments if needed. Patient verbalized understanding of information discussed and he states that he has no further questions at this time related to diabetes.  Thanks, Orlando PennerMarie Paxson Harrower, RN, MSN, CDE Diabetes  Coordinator Inpatient Diabetes Program [phone] (Team Pager) ]

## 2018-05-18 NOTE — Consult Note (Addendum)
ANTICOAGULATION CONSULT NOTE - Initial Consult  Pharmacy Consult for Rivaroxaban Indication: atrial fibrillation  No Known Allergies  Patient Measurements: Height: 5\' 10"  (177.8 cm) Weight: 271 lb 12.8 oz (123.3 kg) IBW/kg (Calculated) : 73  Vital Signs: Temp: 97.6 F (36.4 C) (12/31 0820) Temp Source: Oral (12/31 0820) BP: 151/94 (12/31 0820) Pulse Rate: 75 (12/31 0820)  Labs: Recent Labs    05/15/18 1921  05/16/18 0505 05/17/18 0314 05/18/18 0556  HGB  --    < > 12.0* 11.9* 11.9*  HCT  --   --  36.8* 37.0* 36.1*  PLT  --   --  288 279 267  HEPARINUNFRC 0.48  --  0.47 0.23*  --   CREATININE  --   --   --   --  1.01   < > = values in this interval not displayed.    Estimated Creatinine Clearance: 108.8 mL/min (by C-G formula based on SCr of 1.01 mg/dL).   Medical History: Past Medical History:  Diagnosis Date  . Coronary artery disease     Medications:  Scheduled:  . aspirin  81 mg Oral Daily  . atorvastatin  40 mg Oral QPM  . docusate sodium  100 mg Oral BID  . furosemide  40 mg Oral Daily  . gabapentin  300 mg Oral BID  . glimepiride  2 mg Oral Q breakfast  . hydrALAZINE  25 mg Oral TID  . insulin aspart  0-20 Units Subcutaneous TID WC  . insulin aspart  0-5 Units Subcutaneous QHS  . levothyroxine  150 mcg Oral BH-q7a  . metoprolol tartrate  50 mg Oral BID  . pantoprazole  40 mg Oral Daily  . potassium chloride SA  10 mEq Oral Daily  . rivaroxaban  20 mg Oral Q supper  . sodium chloride flush  3 mL Intravenous Q12H    Assessment: 57 y.o.malewith a past medical history of CAD, HF, CABG, hypothyroidism, hypertension, diabetes, taking Xarelto PTA for PE presented to the emergency department for chest pain on 12/26. Patient had cardiac cath on 12/30.  Goal of Therapy:  Monitor platelets by anticoagulation protocol: Yes   Plan:  Ordered patient's home dose of Rivaroxaban 20 mg PO daily with supper. Will order daily CBC. Pharmacy will continue to  monitor.   Mauri ReadingSavanna M Xela Oregel, PharmD Pharmacy Resident  05/18/2018 9:45 AM

## 2018-05-18 NOTE — Discharge Summary (Signed)
[facility] [place] [facility]   PATIENT NAME: Craig Reese    MR#:  [id number]  DATE OF BIRTH:  11/21/1960  DATE OF ADMISSION:  05/13/2018   ADMITTING PHYSICIAN: Katha Hamming, MD  DATE OF DISCHARGE: 05/18/2018  1:29 PM  PRIMARY CARE PHYSICIAN: [name]   ADMISSION DIAGNOSIS:  New onset atrial fibrillation (HCC) [I48.91] Atrial fibrillation with rapid ventricular response (HCC) [I48.91] Chest pain, unspecified type [R07.9] DISCHARGE DIAGNOSIS:  Active Problems:   Chest pain due to CAD [facility])  SECONDARY DIAGNOSIS:   Past Medical History:  Diagnosis Date  . Coronary artery disease    HOSPITAL COURSE:  57 year old male patient with history of CAD, CABG, diabetes mellitus type 2 complains of midsternal chest pressure since morning associated with diaphoresis, found to have A. fib with RVR on arrival  *A. fib with RVR Rate controlled. He is treated with heparin drip, beta-blocker therapy, Xarelto was discontinued with plans for heart catheterization. Resumed the Xarelto.  *Acute chest pain Resolved Likely secondary to above Troponins inconsistent for acute coronary syndrome History of coronary artery disease status post CABG in 2015, bioprosthetic cardiac valve He is on heparin drip, beta-blocker therapy, statin therapy, aspirin, nitroglycerin, IV morphine PRN breakthrough pain. Status posterior cardiac cath. 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  Follow-up [facility] cardiology as outpatient per Dr. Juliann Pares.  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  *Acute hyperglycemia due to diabetes  T2, new diagnosis.Marland Kitchen Hmoglobin A1c 9.3, on sliding scale insulin with Accu-Cheks per routine, carbohydrate consistent diet/heart healthy, Accu-Cheks per routine started Amaryl and metformin.  *Acute headache Topamax PRN  Right foot pain.  X-ray and uric acid unremarkable per Dr. Orland Jarred. The patient has better right foot pain but complains of left foot pain.  Physical examination is unremarkable except local tenderness. Right hip x-ray is unremarkable.  Morbid obesity.  Advised diet control and follow-up PCP as outpatient. I discussed with Dr. Orland Jarred. DISCHARGE CONDITIONS:  Stable, discharged to home today. CONSULTS OBTAINED:  Treatment Team:  Alwyn Pea, MD [place], Blanchie Serve DRUG ALLERGIES:  No Known Allergies DISCHARGE MEDICATIONS:   Allergies as of 05/18/2018   No Known Allergies     Medication List    TAKE these medications   atorvastatin 40 MG tablet Commonly known as:  LIPITOR Take 1 tablet by mouth daily.   furosemide 40 MG tablet Commonly known as:  LASIX Take 1 tablet by mouth daily.   gabapentin 300 MG capsule Commonly known as:  NEURONTIN Take 1 capsule by mouth 2 (two) times daily.   glimepiride 2 MG tablet Commonly known as:  AMARYL Take 1 tablet (2 mg total) by mouth daily with breakfast.   hydrALAZINE 25 MG tablet Commonly known as:  APRESOLINE Take 1 tablet by mouth 3 (three) times daily.   metFORMIN 500 MG tablet Commonly known as:  GLUCOPHAGE Take 2 tablets (1,000 mg total) by mouth 2 (two) times daily. What changed:  how much to take   metoprolol tartrate 50 MG tablet Commonly known as:  LOPRESSOR Take 1 tablet by mouth 2 (two) times daily.   omeprazole 20 MG capsule Commonly known as:  PRILOSEC Take 2 capsules by  mouth daily.   potassium chloride 10 MEQ tablet Commonly known as:  K-DUR Take 1 tablet by mouth daily.   rivaroxaban 20 MG Tabs tablet Commonly known as:  XARELTO Take 1 tablet by mouth daily.     SYNTHROID 150 MCG tablet Generic drug:  levothyroxine Take 1 tablet by mouth daily.        DISCHARGE INSTRUCTIONS:  See AVS. If you experience worsening of your admission symptoms, develop shortness of breath, life threatening emergency, suicidal or homicidal thoughts you must seek medical attention immediately by calling 911 or calling your MD immediately  if symptoms less severe.  You Must read complete instructions/literature along with all the possible adverse reactions/side effects for all the Medicines you take and that have been prescribed to you. Take any new Medicines after you have completely understood and accpet all the possible adverse reactions/side effects.   Please note  You were cared for by a hospitalist during your hospital stay. If you have any questions about your discharge medications or the care you received while you were in the hospital after you are discharged, you can call the unit and asked to speak with the hospitalist on call if the hospitalist that took care of you is not available. Once you are discharged, your primary care physician will handle any further medical issues. Please note that NO REFILLS for any discharge medications will be authorized once you are discharged, as it is imperative that you return to your primary care physician (or establish a relationship with a primary care physician if you do not have one) for your aftercare needs so that they can reassess your need for medications and monitor your lab values.    On the day of Discharge:  VITAL SIGNS:  Blood pressure (!) 151/94, pulse 75, temperature 97.6 F (36.4 C), temperature source Oral, resp. rate 18, height 5\' 10"  (1.778 m), weight 123.3 kg, SpO2 97 %. PHYSICAL EXAMINATION:  GENERAL:  57 y.o.-year-old patient lying in the bed with no acute distress.  Morbid obesity. EYES: Pupils equal, round, reactive to light and accommodation. No scleral icterus. Extraocular muscles intact.  HEENT:  Head atraumatic, normocephalic. Oropharynx and nasopharynx clear.  NECK:  Supple, no jugular venous distention. No thyroid enlargement, no tenderness.  LUNGS: Normal breath sounds bilaterally, no wheezing, rales,rhonchi or crepitation. No use of accessory muscles of respiration.  CARDIOVASCULAR: S1, S2 normal. No murmurs, rubs, or gallops.  ABDOMEN: Soft, non-tender, non-distended. Bowel sounds present. No organomegaly or mass.  EXTREMITIES: No pedal edema, cyanosis, or clubbing.  NEUROLOGIC: Cranial nerves II through XII are intact. Muscle strength 5/5 in all extremities. Sensation intact. Gait not checked.  PSYCHIATRIC: The patient is alert and oriented x 3.  SKIN: No obvious rash, lesion, or ulcer.  DATA REVIEW:   CBC Recent Labs  Lab 05/18/18 0556  WBC 11.6*  HGB 11.9*  HCT 36.1*  PLT 267    Chemistries  Recent Labs  Lab 05/13/18 1051  05/18/18 0556  NA 134*   < > 136  K 3.2*   < > 3.6  CL 95*   < > 100  CO2 27   < > 25  GLUCOSE 244*   < > 198*  BUN 11   < > 16  CREATININE 0.99   < > 1.01  CALCIUM 8.5*   < > 8.4*  AST 47*  --   --   ALT 51*  --   --   ALKPHOS 117  --   --  BILITOT 0.7  --   --    < > = values in this interval not displayed.     Microbiology Results  No results found for this or any previous visit.  RADIOLOGY:  Dg Ankle Complete Right  Result Date: 05/17/2018 CLINICAL DATA:  Foot and ankle pain EXAM: RIGHT ANKLE - COMPLETE 3+ VIEW COMPARISON:  None. FINDINGS: No fracture or malalignment. Ankle mortise symmetric. Ossicle or old injury adjacent to the fibular malleolus. Minimal spurring medial malleolus. Vascular calcification IMPRESSION: No acute osseous abnormality Electronically Signed   By: Jasmine PangKim  Fujinaga M.D.   On: 05/17/2018 18:02   Dg Foot Complete Right  Result Date: 05/17/2018 CLINICAL DATA:  Foot and ankle pain EXAM: RIGHT FOOT COMPLETE - 3+ VIEW COMPARISON:  None. FINDINGS: There is no evidence of fracture or dislocation. There is no  evidence of arthropathy or other focal bone abnormality. Soft tissues are unremarkable. IMPRESSION: Negative. Electronically Signed   By: Jasmine PangKim  Fujinaga M.D.   On: 05/17/2018 18:01   Dg Hip Unilat With Pelvis 2-3 Views Right  Result Date: 05/18/2018 CLINICAL DATA:  Hip pain after heart catheterization. EXAM: DG HIP (WITH OR WITHOUT PELVIS) 2-3V RIGHT COMPARISON:  No prior. FINDINGS: No acute bony abnormality identified. No evidence of fracture dislocation. Peripheral vascular calcification. IMPRESSION: 1.  No acute bony abnormality identified. 2.  Peripheral vascular disease. Electronically Signed   By: Maisie Fushomas  Register   On: 05/18/2018 08:48     Management plans discussed with the patient, family and they are in agreement.  CODE STATUS: Full Code   TOTAL TIME TAKING CARE OF THIS PATIENT: 32 minutes.    Shaune PollackQing Kenedie Dirocco M.D on 05/18/2018 at 2:14 PM  Between 7am to 6pm - Pager - [phone]  After 6pm go to [email or web address] - Scientist, research (life sciences)password EPAS [email or web address]  [facility] [place] [facility]  Office  [phone]  CC: Primary care physician; [facility]   Note: This dictation was prepared with Nurse, children'sDragon dictation along with smaller phrase technology. Any transcriptional errors that result from this process are unintentional.

## 2018-05-21 ENCOUNTER — Telehealth: Payer: Self-pay

## 2018-05-21 NOTE — Telephone Encounter (Signed)
Flagged on EMMI report for not receiving discharge papers, not having a follow up scheduled, and being unsure if he received new prescriptions.  Called and spoke with patient's niece with assistance of [address] [employer] (Interpreter [place], [place]: [phone]) as patient unavailable.  Confirmed he received discharge papers, filled his prescriptions and has a follow up scheduled.  No other questions or concerns noted.  I thanked her for her time and made them aware the patient would receive one more automated call checking in over the next few days.

## 2018-06-06 ENCOUNTER — Inpatient Hospital Stay
Admission: EM | Admit: 2018-06-06 | Discharge: 2018-06-08 | DRG: 309 | Disposition: A | Discharge status: Home / Self Care | Payer: Self-pay | Attending: Internal Medicine | Admitting: Internal Medicine

## 2018-06-06 ENCOUNTER — Emergency Department: Payer: Self-pay

## 2018-06-06 ENCOUNTER — Other Ambulatory Visit: Payer: Self-pay

## 2018-06-06 DIAGNOSIS — I4891 Unspecified atrial fibrillation: Secondary | ICD-10-CM | POA: Diagnosis present

## 2018-06-06 DIAGNOSIS — E782 Mixed hyperlipidemia: Secondary | ICD-10-CM | POA: Diagnosis present

## 2018-06-06 DIAGNOSIS — Z7982 Long term (current) use of aspirin: Secondary | ICD-10-CM

## 2018-06-06 DIAGNOSIS — Z952 Presence of prosthetic heart valve: Secondary | ICD-10-CM

## 2018-06-06 DIAGNOSIS — I248 Other forms of acute ischemic heart disease: Secondary | ICD-10-CM | POA: Diagnosis present

## 2018-06-06 DIAGNOSIS — I2510 Atherosclerotic heart disease of native coronary artery without angina pectoris: Secondary | ICD-10-CM | POA: Diagnosis present

## 2018-06-06 DIAGNOSIS — R079 Chest pain, unspecified: Secondary | ICD-10-CM

## 2018-06-06 DIAGNOSIS — Z7984 Long term (current) use of oral hypoglycemic drugs: Secondary | ICD-10-CM

## 2018-06-06 DIAGNOSIS — Z951 Presence of aortocoronary bypass graft: Secondary | ICD-10-CM

## 2018-06-06 DIAGNOSIS — I480 Paroxysmal atrial fibrillation: Secondary | ICD-10-CM

## 2018-06-06 DIAGNOSIS — Z7901 Long term (current) use of anticoagulants: Secondary | ICD-10-CM

## 2018-06-06 DIAGNOSIS — I5022 Chronic systolic (congestive) heart failure: Secondary | ICD-10-CM | POA: Diagnosis present

## 2018-06-06 DIAGNOSIS — E119 Type 2 diabetes mellitus without complications: Secondary | ICD-10-CM | POA: Diagnosis present

## 2018-06-06 DIAGNOSIS — I4510 Unspecified right bundle-branch block: Secondary | ICD-10-CM | POA: Diagnosis present

## 2018-06-06 DIAGNOSIS — Z793 Long term (current) use of hormonal contraceptives: Secondary | ICD-10-CM

## 2018-06-06 DIAGNOSIS — Z79899 Other long term (current) drug therapy: Secondary | ICD-10-CM

## 2018-06-06 DIAGNOSIS — I482 Chronic atrial fibrillation, unspecified: Principal | ICD-10-CM | POA: Diagnosis present

## 2018-06-06 DIAGNOSIS — I110 Hypertensive heart disease with heart failure: Secondary | ICD-10-CM | POA: Diagnosis present

## 2018-06-06 HISTORY — DX: Paroxysmal atrial fibrillation: I48.0

## 2018-06-06 LAB — BASIC METABOLIC PANEL
Anion gap: 10 (ref 5–15)
BUN: 18 mg/dL (ref 6–20)
CALCIUM: 8.9 mg/dL (ref 8.9–10.3)
CO2: 25 mmol/L (ref 22–32)
CREATININE: 1.12 mg/dL (ref 0.61–1.24)
Chloride: 101 mmol/L (ref 98–111)
GFR calc Af Amer: >60 mL/min (ref >60)
GFR calc non Af Amer: >60 mL/min (ref >60)
Glucose, Bld: 182 mg/dL — ABNORMAL HIGH (ref 70–99)
Potassium: 3.7 mmol/L (ref 3.5–5.1)
Sodium: 136 mmol/L (ref 135–145)

## 2018-06-06 LAB — TROPONIN I
Troponin I: 0.03 ng/mL (ref <0.03)
Troponin I: 0.06 ng/mL (ref <0.03)
Troponin I: 0.11 ng/mL (ref <0.03)
Troponin I: 0.12 ng/mL (ref <0.03)

## 2018-06-06 LAB — CBC
HCT: 41.7 % (ref 39.0–52.0)
Hemoglobin: 13.6 g/dL (ref 13.0–17.0)
MCH: 28.2 pg (ref 26.0–34.0)
MCHC: 32.6 g/dL (ref 30.0–36.0)
MCV: 86.5 fL (ref 80.0–100.0)
Platelets: 348 10*3/uL (ref 150–400)
RBC: 4.82 MIL/uL (ref 4.22–5.81)
RDW: 13.6 % (ref 11.5–15.5)
WBC: 11.2 10*3/uL — ABNORMAL HIGH (ref 4.0–10.5)
nRBC: 0 % (ref 0.0–0.2)

## 2018-06-06 LAB — GLUCOSE, CAPILLARY
GLUCOSE-CAPILLARY: 124 mg/dL — AB (ref 70–99)
Glucose-Capillary: 139 mg/dL — ABNORMAL HIGH (ref 70–99)
Glucose-Capillary: 160 mg/dL — ABNORMAL HIGH (ref 70–99)

## 2018-06-06 LAB — PROTIME-INR
INR: 0.96
Prothrombin Time: 12.7 seconds (ref 11.4–15.2)

## 2018-06-06 LAB — APTT: aPTT: 31 seconds (ref 24–36)

## 2018-06-06 LAB — MAGNESIUM: Magnesium: 2 mg/dL (ref 1.7–2.4)

## 2018-06-06 MED ORDER — INSULIN ASPART 100 UNIT/ML ~~LOC~~ SOLN: 0.0000 [IU] | Freq: Every day | SUBCUTANEOUS | Status: DC

## 2018-06-06 MED ORDER — SODIUM CHLORIDE 0.9 % IV BOLUS
250.0000 mL | Freq: Once | INTRAVENOUS | Status: AC
  Administered 2018-06-06: 250 mL via INTRAVENOUS

## 2018-06-06 MED ORDER — SODIUM CHLORIDE 0.9% FLUSH
3.0000 mL | Freq: Once | INTRAVENOUS | Status: AC
  Administered 2018-06-06: 3 mL via INTRAVENOUS

## 2018-06-06 MED ORDER — SODIUM CHLORIDE 0.9% FLUSH
10.0000 mL | Freq: Two times a day (BID) | INTRAVENOUS | Status: DC
  Administered 2018-06-06 – 2018-06-07 (×2): 10 mL via INTRAVENOUS

## 2018-06-06 MED ORDER — GABAPENTIN 300 MG PO CAPS
300.0000 mg | ORAL_CAPSULE | Freq: Two times a day (BID) | ORAL | Status: DC
  Administered 2018-06-06 – 2018-06-08 (×5): 300 mg via ORAL
  Filled 2018-06-06 (×5): qty 1

## 2018-06-06 MED ORDER — METOPROLOL TARTRATE 5 MG/5ML IV SOLN
INTRAVENOUS | Status: AC
  Administered 2018-06-06: 5 mg via INTRAVENOUS
  Filled 2018-06-06: qty 5

## 2018-06-06 MED ORDER — ASPIRIN 81 MG PO CHEW
324.0000 mg | CHEWABLE_TABLET | Freq: Once | ORAL | Status: AC
  Administered 2018-06-06: 324 mg via ORAL
  Filled 2018-06-06: qty 4

## 2018-06-06 MED ORDER — TRAMADOL HCL 50 MG PO TABS
50.0000 mg | ORAL_TABLET | Freq: Four times a day (QID) | ORAL | Status: DC | PRN
  Administered 2018-06-06: 50 mg via ORAL
  Filled 2018-06-06 (×2): qty 1

## 2018-06-06 MED ORDER — ASPIRIN 81 MG PO CHEW
81.0000 mg | CHEWABLE_TABLET | Freq: Every day | ORAL | Status: DC
  Administered 2018-06-07 – 2018-06-08 (×2): 81 mg via ORAL
  Filled 2018-06-06 (×3): qty 1

## 2018-06-06 MED ORDER — ONDANSETRON HCL 4 MG/2ML IJ SOLN: 4.0000 mg | Freq: Four times a day (QID) | INTRAMUSCULAR | Status: DC | PRN

## 2018-06-06 MED ORDER — METOPROLOL TARTRATE 50 MG PO TABS
50.0000 mg | ORAL_TABLET | Freq: Two times a day (BID) | ORAL | Status: DC
  Administered 2018-06-06 – 2018-06-08 (×4): 50 mg via ORAL
  Filled 2018-06-06 (×5): qty 1

## 2018-06-06 MED ORDER — AMIODARONE LOAD VIA INFUSION
150.0000 mg | Freq: Once | INTRAVENOUS | Status: AC
  Administered 2018-06-06: 150 mg via INTRAVENOUS
  Filled 2018-06-06: qty 83.34

## 2018-06-06 MED ORDER — METOPROLOL TARTRATE 5 MG/5ML IV SOLN
2.5000 mg | Freq: Once | INTRAVENOUS | Status: AC
  Administered 2018-06-06: 2.5 mg via INTRAVENOUS
  Filled 2018-06-06: qty 5

## 2018-06-06 MED ORDER — MORPHINE SULFATE (PF) 2 MG/ML IV SOLN
2.0000 mg | INTRAVENOUS | Status: DC | PRN
  Administered 2018-06-06: 2 mg via INTRAVENOUS
  Filled 2018-06-06: qty 1

## 2018-06-06 MED ORDER — LEVOTHYROXINE SODIUM 50 MCG PO TABS
150.0000 ug | ORAL_TABLET | Freq: Every day | ORAL | Status: DC
  Administered 2018-06-07 – 2018-06-08 (×2): 150 ug via ORAL
  Filled 2018-06-06 (×2): qty 1

## 2018-06-06 MED ORDER — METOPROLOL TARTRATE 5 MG/5ML IV SOLN
5.0000 mg | INTRAVENOUS | Status: AC
  Administered 2018-06-06: 5 mg via INTRAVENOUS

## 2018-06-06 MED ORDER — AMIODARONE HCL IN DEXTROSE 360-4.14 MG/200ML-% IV SOLN
30.0000 mg/h | INTRAVENOUS | Status: DC
  Administered 2018-06-06 – 2018-06-07 (×2): 30 mg/h via INTRAVENOUS
  Filled 2018-06-06: qty 200

## 2018-06-06 MED ORDER — ACETAMINOPHEN 500 MG PO TABS
500.0000 mg | ORAL_TABLET | Freq: Four times a day (QID) | ORAL | Status: DC | PRN
  Administered 2018-06-07: 500 mg via ORAL
  Filled 2018-06-06: qty 1

## 2018-06-06 MED ORDER — INSULIN ASPART 100 UNIT/ML ~~LOC~~ SOLN
0.0000 [IU] | Freq: Three times a day (TID) | SUBCUTANEOUS | Status: DC
  Administered 2018-06-06: 1 [IU] via SUBCUTANEOUS
  Administered 2018-06-06: 2 [IU] via SUBCUTANEOUS
  Administered 2018-06-07 – 2018-06-08 (×4): 1 [IU] via SUBCUTANEOUS
  Filled 2018-06-06 (×6): qty 1

## 2018-06-06 MED ORDER — DOCUSATE SODIUM 100 MG PO CAPS: 100.0000 mg | ORAL_CAPSULE | Freq: Two times a day (BID) | ORAL | Status: DC | PRN

## 2018-06-06 MED ORDER — GLIMEPIRIDE 2 MG PO TABS
2.0000 mg | ORAL_TABLET | Freq: Every day | ORAL | Status: DC
  Administered 2018-06-07 – 2018-06-08 (×2): 2 mg via ORAL
  Filled 2018-06-06 (×2): qty 1

## 2018-06-06 MED ORDER — METOPROLOL TARTRATE 50 MG PO TABS: 50.0000 mg | ORAL_TABLET | Freq: Once | ORAL | Status: DC

## 2018-06-06 MED ORDER — ONDANSETRON HCL 4 MG PO TABS: 4.0000 mg | ORAL_TABLET | Freq: Four times a day (QID) | ORAL | Status: DC | PRN

## 2018-06-06 MED ORDER — RIVAROXABAN 20 MG PO TABS
20.0000 mg | ORAL_TABLET | Freq: Every day | ORAL | Status: DC
  Administered 2018-06-06 – 2018-06-07 (×2): 20 mg via ORAL
  Filled 2018-06-06 (×2): qty 1

## 2018-06-06 MED ORDER — AMIODARONE HCL IN DEXTROSE 360-4.14 MG/200ML-% IV SOLN
60.0000 mg/h | INTRAVENOUS | Status: DC
  Administered 2018-06-06 (×2): 60 mg/h via INTRAVENOUS
  Filled 2018-06-06 (×2): qty 200

## 2018-06-06 NOTE — ED Notes (Addendum)
.  Report was attempted to call report at this time RN will continue to monitor.

## 2018-06-06 NOTE — ED Notes (Signed)
Date and time results received: 06/06/18 0909 (use smartphrase ".now" to insert current time)  Test: Trop Critical Value: 0.03  Name of Provider Notified: Fanny Bien

## 2018-06-06 NOTE — Progress Notes (Signed)
[employer] Interpreter [phone]

## 2018-06-06 NOTE — H&P (Signed)
[facility] [place] [facility]   PATIENT NAME: Craig PernaJose Reese    MR#:  [id number]  DATE OF BIRTH:  [birth year]  DATE OF ADMISSION:  06/06/2018  PRIMARY CARE PHYSICIAN: [facility]   REQUESTING/REFERRING PHYSICIAN:  Sharyn CreamerQuale, Mark, MD  CHIEF COMPLAINT:   Sob, chest pain HISTORY OF PRESENT ILLNESS:  Craig PernaJose Reese  is a 58 y.o. male with a known history of coronary artery disease, chronic atrial fibrillation on Xarelto, diabetes mellitus, systolic congestive heart failure is presenting to the ED with a chief complaint of chest pain which was started at 5:30 AM associated with his palpitations.  He is in atrial fibrillation with RVR, and placed on amiodarone drip by the ED physician after discussing with cardiologist.  Patient is reporting midsternal chest pain history with pressure and discomfort which is nonradiating.  Pain is 6 out of 10 but refusing pain medications.  No family members at bedside.  PAST MEDICAL HISTORY:   Past Medical History:  Diagnosis Date  . Coronary artery disease   Systolic congestive heart failure, atrial fibrillation diabetes mellitus  PAST SURGICAL HISTOIRY:   Past Surgical History:  Procedure Laterality Date  . CORONARY ARTERY BYPASS GRAFT    . LEFT HEART CATH AND CORS/GRAFTS ANGIOGRAPHY N/A 05/17/2018   Procedure: LEFT HEART CATH AND CORS/GRAFTS ANGIOGRAPHY and possible PCI and stent;  Surgeon: Alwyn Peaallwood, Dwayne D, MD;  Location: [facility] INVASIVE CV LAB;  Service: Cardiovascular;  Laterality: N/A;    SOCIAL HISTORY:   Social History   Tobacco Use  . Smoking status: Never Smoker  . Smokeless tobacco: Never Used  Substance Use Topics  . Alcohol use: Not Currently    FAMILY HISTORY:  History reviewed. No pertinent family history.  DRUG ALLERGIES:  No Known Allergies  REVIEW OF SYSTEMS:  CONSTITUTIONAL: No fever, fatigue or weakness.  EYES: No blurred or double vision.  EARS, NOSE, AND THROAT:  No tinnitus or ear pain.  RESPIRATORY: No cough, reporting shortness of breath, denies wheezing or hemoptysis.  CARDIOVASCULAR: chest pain, denies orthopnea, edema.  GASTROINTESTINAL: No nausea, vomiting, diarrhea or abdominal pain.  GENITOURINARY: No dysuria, hematuria.  ENDOCRINE: No polyuria, nocturia,  HEMATOLOGY: No anemia, easy bruising or bleeding SKIN: No rash or lesion. MUSCULOSKELETAL: No joint pain or arthritis.   NEUROLOGIC: No tingling, numbness, weakness.  PSYCHIATRY: No anxiety or depression.   MEDICATIONS AT HOME:   Prior to Admission medications   Medication Sig Start Date End Date Taking? Authorizing Provider  acetaminophen (TYLENOL) 500 MG tablet Take 500 mg by mouth every 6 (six) hours as needed.   Yes [provider]  aspirin 81 MG chewable tablet Chew 81 mg by mouth daily.   Yes [provider]  gabapentin (NEURONTIN) 300 MG capsule Take 1 capsule by mouth 2 (two) times daily. 10/07/17 12/23/18 Yes [provider]  glimepiride (AMARYL) 2 MG tablet Take 1 tablet (2 mg total) by mouth daily with breakfast. 05/18/18  Yes Shaune Pollackhen, Qing, MD  hydrALAZINE (APRESOLINE) 25 MG tablet Take 1 tablet by mouth 3 (three) times daily. 12/23/17  Yes [provider]  levothyroxine (SYNTHROID) 150 MCG tablet Take 1 tablet by mouth daily. 06/16/17 12/23/18 Yes [provider]  metFORMIN (GLUCOPHAGE) 500 MG tablet Take 2 tablets (1,000 mg total) by mouth 2 (two) times daily. 05/17/18 11/23/19 Yes Shaune Pollackhen, Qing, MD  rivaroxaban (XARELTO) 20 MG TABS tablet Take 1 tablet by mouth daily. 05/09/15 07/05/18 Yes [provider]  VITAL SIGNS:  Blood pressure 92/73, pulse (!) 108, resp. rate 20, height 5\' 10"  (1.778 m), weight 122.5 kg, SpO2 97 %.  PHYSICAL EXAMINATION:  GENERAL:  58 y.o.-year-old patient lying in the bed with no acute distress.  EYES: Pupils equal, round, reactive to light and accommodation. No scleral icterus. Extraocular muscles  intact.  HEENT: Head atraumatic, normocephalic. Oropharynx and nasopharynx clear.  NECK:  Supple, no jugular venous distention. No thyroid enlargement, no tenderness.  LUNGS: Normal breath sounds bilaterally, no wheezing, rales,rhonchi or crepitation. No use of accessory muscles of respiration.  CARDIOVASCULAR: Irregularly irregular no murmurs, rubs, or gallops.  ABDOMEN: Soft, nontender, nondistended. Bowel sounds present.  EXTREMITIES: No pedal edema, cyanosis, or clubbing.  NEUROLOGIC: Awake, alert and oriented x3 sensation intact. Gait not checked.  PSYCHIATRIC: The patient is alert and oriented x 3.  SKIN: No obvious rash, lesion, or ulcer.   LABORATORY PANEL:   CBC Recent Labs  Lab 06/06/18 0845  WBC 11.2*  HGB 13.6  HCT 41.7  PLT 348   ------------------------------------------------------------------------------------------------------------------  Chemistries  Recent Labs  Lab 06/06/18 0845  NA 136  K 3.7  CL 101  CO2 25  GLUCOSE 182*  BUN 18  CREATININE 1.12  CALCIUM 8.9   ------------------------------------------------------------------------------------------------------------------  Cardiac Enzymes Recent Labs  Lab 06/06/18 0845  TROPONINI 0.03*   ------------------------------------------------------------------------------------------------------------------  RADIOLOGY:  Dg Chest Portable 1 View  Result Date: 06/06/2018 CLINICAL DATA:  Chest pain EXAM: PORTABLE CHEST 1 VIEW COMPARISON:  May 13, 2018. FINDINGS: There is no edema or consolidation. Heart is mildly enlarged with pulmonary vascularity normal. No adenopathy. There is aortic atherosclerosis. There is evidence of previous coronary artery bypass grafting. There is postoperative change in the left cervical-thoracic junction region. IMPRESSION: No edema or consolidation. Cardiomegaly. Status post coronary artery bypass grafting. There is aortic atherosclerosis. Aortic Atherosclerosis  (ICD10-I70.0). Electronically Signed   By: Bretta BangWilliam  Woodruff III M.D.   On: 06/06/2018 09:04    EKG:   Orders placed or performed during the hospital encounter of 06/06/18  . ED EKG  . ED EKG  . EKG 12-Lead  . EKG 12-Lead  . ED EKG  . ED EKG    IMPRESSION AND PLAN:   #Atrial fibrillation with RVR Admit to telemetry On amiodarone drip titrate as needed and try to wean it off Continue home medication Coreg and Xarelto Cardiology consult placed to Dr. Gwen PoundsKowalski  #Chest pain-probably from A. fib with RVR We will monitor patient on telemetry and cycle cardiac biomarkers Continue home medication Coreg , statin, Imdur Pain management as needed  #Chronic systolic congestive heart failure Continue diuretics monitor intake and output and daily weights  #Diabetes mellitus Hold metformin and continue Amaryl Sliding scale insulin  #History of coronary artery disease status post CABG in year 2015, bioprosthetic cardiac valve, status post cardiac cath recently during the last week of December 2019-seen and evaluated by cardiology and there is an evidence of fistula with arterial branch.  [facility] during previous admission in December 2019 Cardiology consult placed Continue home medication Coreg, Imdur, statin  DVT prophylaxis on Xarelto   All the records are reviewed and case discussed with ED provider. Management plans discussed with the patient, family and they are in agreement.  CODE STATUS: fc   TOTAL TIME TAKING CARE OF THIS PATIENT: 43  minutes.   Note: This dictation was prepared with Dragon dictation along with smaller phrase technology. Any transcriptional errors that  result from this process are unintentional.  Ramonita Lab M.D on 06/06/2018 at 11:14 AM  Between 7am to 6pm - Pager - [phone]  After 6pm go to [email or web address] - password [email or web address] [facility]  [place] [facility]  Office   [phone]  CC: Primary care physician; [name]

## 2018-06-06 NOTE — Progress Notes (Signed)
CRITICAL VALUE ALERT  Critical Value:  Troponin 0.11  Date & Time Notied:  06/06/2018.1743  Provider Notified: Dr. Gwen Pounds  Orders Received/Actions taken: no new orders

## 2018-06-06 NOTE — ED Notes (Signed)
EKG done as pt states his pain is returning. RN will monitor.

## 2018-06-06 NOTE — ED Notes (Signed)
Pt transported to the Unit by this RN and Genelle Bal NA. Bedside report was given toAlcario Drought RN

## 2018-06-06 NOTE — ED Notes (Signed)
EKG completed at this time as Amiodarone is started

## 2018-06-06 NOTE — Progress Notes (Signed)
Patient admitted to unit. Oriented to room, call bell, and staff. Bed in lowest position. Fall safety plan reviewed. Full assessment to Epic. Skin assessment verified with Tammy T. Telemetry box verification with tele clerk- Box#:[id number]. Pt complaining of chest pressure 8/10, Afibw/ RVR on tele, VSS, pt states" it comes and goes" Dr. Amado Coe notified, per MD will put orders in. Will continue to monitor.

## 2018-06-06 NOTE — ED Provider Notes (Signed)
[facility] Emergency Department Provider Note   ____________________________________________   First MD Initiated Contact with Patient 06/06/18 [phone]     (approximate)  I have reviewed the triage vital signs and the nursing notes.  Spanish interpreter [place] utilized for evaluation and re-evaluations  HISTORY  Chief Complaint Chest Pain  EM caveat: Acuity, hemodynamic instability with acute concern for cardiovascular etiology/emergency  HPI Craig Reese is a 58 y.o. male history of coronary artery disease, recent hospitalization with atrial fibrillation started on anticoagulation  Patient reports at 530 this morning sudden onset of a severe chest pain.  Located underneath his chest, racing heart.  He is taking his medications including his Xarelto but has not had his morning medications  Reports a severe pressure, discomfort and shortness of breath underneath the chest.  He reports he had the same 2 weeks ago when he came to the hospital and had to have a heart procedure.  No fevers or chills.  Was in good health yesterday, sudden onset about 530 this morning  Pain is described as severe.  Nonradiating.  Pressure across the chest.   Past Medical History:  Diagnosis Date  . Coronary artery disease     Patient Active Problem List   Diagnosis Date Noted  . Chest pain due to CAD (HCC) 05/13/2018    Past Surgical History:  Procedure Laterality Date  . CORONARY ARTERY BYPASS GRAFT    . LEFT HEART CATH AND CORS/GRAFTS ANGIOGRAPHY N/A 05/17/2018   Procedure: LEFT HEART CATH AND CORS/GRAFTS ANGIOGRAPHY and possible PCI and stent;  Surgeon: Alwyn Pea, MD;  Location: [facility] INVASIVE CV LAB;  Service: Cardiovascular;  Laterality: N/A;    Prior to Admission medications   Medication Sig Start Date End Date Taking? Authorizing Provider  acetaminophen (TYLENOL) 500 MG tablet Take 500 mg by mouth every 6 (six) hours as needed.   Yes [provider]  aspirin 81 MG chewable tablet Chew 81 mg by mouth daily.   Yes [provider]  gabapentin (NEURONTIN) 300 MG capsule Take 1 capsule by mouth 2 (two) times daily. 10/07/17 12/23/18 Yes [provider]  glimepiride (AMARYL) 2 MG tablet Take 1 tablet (2 mg total) by mouth daily with breakfast. 05/18/18  Yes Shaune Pollack, MD  hydrALAZINE (APRESOLINE) 25 MG tablet Take 1 tablet by mouth 3 (three) times daily. 12/23/17  Yes [provider]  levothyroxine (SYNTHROID) 150 MCG tablet Take 1 tablet by mouth daily. 06/16/17 12/23/18 Yes [provider]  metFORMIN (GLUCOPHAGE) 500 MG tablet Take 2 tablets (1,000 mg total) by mouth 2 (two) times daily. 05/17/18 11/23/19 Yes Shaune Pollack, MD  rivaroxaban (XARELTO) 20 MG TABS tablet Take 1 tablet by mouth daily. 05/09/15 07/05/18 Yes [provider]    Allergies Patient has no known allergies.  History reviewed. No pertinent family history.  Social History Social History   Tobacco Use  . Smoking status: Never Smoker  . Smokeless tobacco: Never Used  Substance Use Topics  . Alcohol use: Not Currently  . Drug use: Not on file    Review of Systems Constitutional: No fever/chills Eyes: No visual changes. ENT: No sore throat. Cardiovascular: See HPI Respiratory: Denies shortness of breath. Gastrointestinal: No abdominal pain.   Neurological: Negative for headaches or new weakness.    ____________________________________________   PHYSICAL EXAM:  VITAL SIGNS: ED Triage Vitals  Enc Vitals Group     BP 06/06/18 0845 (!) 136/105     Pulse Rate 06/06/18 0845 Marland Kitchen)  131     Resp 06/06/18 0845 (!) 30     Temp --      Temp src --      SpO2 06/06/18 0845 99 %     Weight 06/06/18 0838 270 lb (122.5 kg)     Height 06/06/18 0838 5\' 10"  (1.778 m)     Head Circumference --      Peak Flow --      Pain Score 06/06/18 0838 10     Pain Loc --      Pain Edu? --      Excl. in GC? --     Constitutional:  Alert and oriented.  Ill-appearing in distress.  Appears to be having active severe chest pain Eyes: Conjunctivae are normal. Head: Atraumatic. Nose: No congestion/rhinnorhea. Mouth/Throat: Mucous membranes are moist. Neck: No stridor.  Cardiovascular: Tachycardic and irregular grossly normal heart sounds.  Good peripheral circulation. Respiratory: Normal respiratory effort.  No retractions. Lungs CTAB. Gastrointestinal: Soft and nontender. No distention. Musculoskeletal: No lower extremity tenderness nor edema. Neurologic:  Normal speech and language. No gross focal neurologic deficits are appreciated.  Skin:  Skin is warm, diaphoretic and intact. No rash noted. Psychiatric: Mood and affect are anxious. Speech and behavior are normal.  ____________________________________________   LABS (all labs ordered are listed, but only abnormal results are displayed)  Labs Reviewed  BASIC METABOLIC PANEL - Abnormal; Notable for the following components:      Result Value   Glucose, Bld 182 (*)    All other components within normal limits  CBC - Abnormal; Notable for the following components:   WBC 11.2 (*)    All other components within normal limits  TROPONIN I - Abnormal; Notable for the following components:   Troponin I 0.03 (*)    All other components within normal limits  PROTIME-INR  APTT   ____________________________________________  EKG  Reviewed entered by me at 842 Heart rate 140 QRS 140 QTc 510 Right bundle branch block, atrial fibrillation with rapid ventricular response. ____________________________________________  RADIOLOGY  Dg Chest Portable 1 View  Result Date: 06/06/2018 CLINICAL DATA:  Chest pain EXAM: PORTABLE CHEST 1 VIEW COMPARISON:  May 13, 2018. FINDINGS: There is no edema or consolidation. Heart is mildly enlarged with pulmonary vascularity normal. No adenopathy. There is aortic atherosclerosis. There is evidence of previous coronary artery bypass  grafting. There is postoperative change in the left cervical-thoracic junction region. IMPRESSION: No edema or consolidation. Cardiomegaly. Status post coronary artery bypass grafting. There is aortic atherosclerosis. Aortic Atherosclerosis (ICD10-I70.0). Electronically Signed   By: Bretta BangWilliam  Woodruff III M.D.   On: 06/06/2018 09:04  Chest x-ray reviewed, no acute findings    ____________________________________________   PROCEDURES  Procedure(s) performed: None  Procedures  Critical Care performed: Yes, see critical care note(s)  CRITICAL CARE Performed by: Sharyn CreamerMark Quale   Total critical care time: 40 minutes  Critical care time was exclusive of separately billable procedures and treating other patients.  Critical care was necessary to treat or prevent imminent or life-threatening deterioration.  Critical care was time spent personally by me on the following activities: development of treatment plan with patient and/or surrogate as well as nursing, discussions with consultants, evaluation of patient's response to treatment, examination of patient, obtaining history from patient or surrogate, ordering and performing treatments and interventions, ordering and review of laboratory studies, ordering and review of radiographic studies, pulse oximetry and re-evaluation of patient's condition.    ____________________________________________   INITIAL IMPRESSION / ASSESSMENT  AND PLAN / ED COURSE  Pertinent labs & imaging results that were available during my care of the patient were reviewed by me and considered in my medical decision making (see chart for details).   Differential diagnosis includes, but is not limited to, ACS, aortic dissection, pulmonary embolism, cardiac tamponade, pneumothorax, pneumonia, pericarditis, myocarditis, GI-related causes including esophagitis/gastritis, and musculoskeletal chest wall pain.  Given the patient's presentation today as well as 2 weeks ago it  appears patient is likely having acute demand ischemia secondary to A. fib with RVR.  Early consultation with cardiology initiated, IV beta-blockers as advised by cardiology.   Clinical Course as of Jun 07 1019  Sun Jun 06, 2018  0900 Dr. Gwen Pounds advises continue rate control with metoprolol IV as BP allows.    [MQ]  0919 Her rate improved to about 120s averaging still in A. fib RVR.  Patient reports his pain is quite a bit better.  He is resting comfortably conversing on the phone.  His blood pressure was maintaining well with systolic above 110, will give additional small dose of metoprolol at this time.   [MQ]    Clinical Course User Index [MQ] Sharyn Creamer, MD   ----------------------------------------- 10:01 AM on 06/06/2018 ----------------------------------------- Repeat EKG reviewed by me at 1008 Heart rate 110 QRS 140 QTc 460 Atrial fibrillation, rapid ventricular response.  Nonspecific T wave abnormality.  No evidence of acute ischemia denoted  Also reviewed case with Dr. Gwen Pounds, as the patient's blood pressure now in the mid 90s to low 100 range on rechecks and his pain is overall improving, will initiate amiodarone infusion and hospitalization at this time.  Cardiology will see in consultation, case admission discussed with Dr. Luberta Mutter.  Reviewed with patient via interpreter, patient understanding agreeable with plan for admission   ____________________________________________   FINAL CLINICAL IMPRESSION(S) / ED DIAGNOSES  Final diagnoses:  Atrial fibrillation with rapid ventricular response (HCC)  Chest pain, moderate coronary artery risk        Note:  This document was prepared using Dragon voice recognition software and may include unintentional dictation errors       Sharyn Creamer, MD 06/06/18 1021

## 2018-06-06 NOTE — ED Triage Notes (Signed)
Central and L CP that began at 5:30. Had heart cath and new onset a fib in December. Also states hands feel numb, is hyperventilating. Takes a blood thinner.   Used spanish interpreter for triage. Family speaks english.

## 2018-06-06 NOTE — Progress Notes (Addendum)
Pt continues to complains of chest pressure, 8/10 pt states its worst with movement, HR currently in Afib with RVR, HR 120-140's, Amio gtt going at 33.3 ml/hr. Dr. Gwen Pounds notified, received verbal orders for metoprolol 50 mg BID. Will administer and continue to monitor.

## 2018-06-06 NOTE — ED Notes (Signed)
Interpreter requested 

## 2018-06-06 NOTE — Plan of Care (Signed)

## 2018-06-06 NOTE — ED Notes (Signed)
EKG done at this time 5 mL of Lopressor administered at this time.

## 2018-06-06 NOTE — Progress Notes (Signed)
Pt converted to NRS, HR in the 80's now, Dr. Gwen PoundsKowalski notified

## 2018-06-06 NOTE — Consult Note (Signed)
  Amiodarone Drug - Drug Interaction Consult Note  Recommendations:   Zofran has been held after a discussion with the attending physician. Glipizide is being continued from home and pharmacy will assist in monitoring blood glucose  Amiodarone is metabolized by the cytochrome P450 system and therefore has the potential to cause many drug interactions. Amiodarone has an average plasma half-life of 50 days (range 20 to 100 days).   There is potential for drug interactions to occur several weeks or months after stopping treatment and the onset of drug interactions may be slow after initiating amiodarone.   [x]  Oral hypoglycemic agents (glyburide, glipizide, glimepiride): increased risk of hypoglycemia. Patient's glucose levels should be monitored closely when initiating amiodarone therapy.   [x]  Drugs that prolong the QT interval:  Torsades de pointes risk may be increased with concurrent use - avoid if possible.  Monitor QTc, also keep magnesium/potassium WNL if concurrent therapy can't be avoided. Marland Kitchen Antibiotics: e.g. fluoroquinolones, erythromycin. . Antiarrhythmics: e.g. quinidine, procainamide, disopyramide, sotalol. . Antipsychotics: e.g. phenothiazines, haloperidol.  . Lithium, tricyclic antidepressants, and methadone.  Thank You,   Lowella Bandy , PharmD 06/06/2018 12:09 PM

## 2018-06-06 NOTE — ED Notes (Signed)
Pt is more relaxed at this time. HR is 117-121. Pt was given 2.5 mg of lopressor and is resting well at this time. RN will monitor.

## 2018-06-06 NOTE — Progress Notes (Signed)
Family Meeting Note  Advance Directive:yes  Today a meeting took place with the Patient.     The following clinical team members were present during this meeting:MD  Spanish interpreter [place]  The following were discussed:Patient's diagnosis: Chest pain, atrial fibrillation with RVR, chronic systolic congestive heart failure, coronary artery disease status post CABG and recent cardiac catheterization with evidence of fistula, diabetes mellitus, treatment plan of care discussed in detail with the patient.  He verbalized understanding of the plan   patient's progosis: Unable to determine and Goals for treatment: Full Code, niece Kerrie [place]  Additional follow-up to be provided: Hospitalist, cardiology  Time spent during discussion:17 MIN  Ramonita Lab, MD

## 2018-06-06 NOTE — Progress Notes (Signed)
. ED TO INPATIENT HANDOFF REPORT  Name/Age/Gender Craig Reese 58 y.o. male  Code Status Code Status History    Date Active Date Inactive Code Status Order ID Comments User Context   05/13/2018 1408 05/18/2018 1647 Full Code [id number]  Katha HammingKonidena, Snehalatha, MD ED      Home/SNF/Other Home  Chief Complaint chest pain here prev Dec same  Level of Care/Admitting Diagnosis ED Disposition    ED Disposition Condition Comment   Admit  Hospital Area: [facility] [[id number]]  Level of Care: Telemetry [5]  Diagnosis: Atrial fibrillation with RVR [facility](HCC) [[id number]]) [[id number]]  Admitting Physician: Ramonita LabGOURU, ARUNA [[id number]]  Attending Physician: Ramonita LabGOURU, ARUNA [[id number]]  Estimated length of stay: past midnight tomorrow  Certification:: I certify this patient will need inpatient services for at least 2 midnights  Bed request comments: 2a  PT Class (Do Not Modify): Inpatient [101]  PT Acc Code (Do Not Modify): Private [1]       Medical History Past Medical History:  Diagnosis Date  . Coronary artery disease     Allergies No Known Allergies  IV Location/Drains/Wounds Patient Lines/Drains/Airways Status   Active Line/Drains/Airways    Name:   Placement date:   Placement time:   Site:   Days:   Peripheral IV 06/06/18 Left Antecubital   06/06/18    1000    Antecubital   less than 1          Labs/Imaging Results for orders placed or performed during the hospital encounter of 06/06/18 (from the past 48 hour(s))  Basic metabolic panel     Status: Abnormal   Collection Time: 06/06/18  8:45 AM  Result Value Ref Range   Sodium 136 135 - 145 mmol/L   Potassium 3.7 3.5 - 5.1 mmol/L    Comment: HEMOLYSIS AT THIS LEVEL MAY AFFECT RESULT   Chloride 101 98 - 111 mmol/L   CO2 25 22 - 32 mmol/L   Glucose, Bld 182 (H) 70 - 99 mg/dL   BUN 18 6 - 20 mg/dL   Creatinine, Ser 0.861.12 0.61 - 1.24 mg/dL   Calcium 8.9 8.9 - 57.810.3 mg/dL   GFR calc non Af Amer >60 >60 mL/min   GFR calc Af Amer >60  >60 mL/min   Anion gap 10 5 - 15    Comment: Performed at [facility], [address] [place], [place] [address]  CBC     Status: Abnormal   Collection Time: 06/06/18  8:45 AM  Result Value Ref Range   WBC 11.2 (H) 4.0 - 10.5 K/uL   RBC 4.82 4.22 - 5.81 MIL/uL   Hemoglobin 13.6 13.0 - 17.0 g/dL   HCT 95.241.7 84.139.0 - 32.452.0 %   MCV 86.5 80.0 - 100.0 fL   MCH 28.2 26.0 - 34.0 pg   MCHC 32.6 30.0 - 36.0 g/dL   RDW 40.113.6 02.711.5 - 25.315.5 %   Platelets 348 150 - 400 K/uL   nRBC 0.0 0.0 - 0.2 %    Comment: Performed at [facility], [address] [place], [place] [address]  Troponin I - ONCE - STAT     Status: Abnormal   Collection Time: 06/06/18  8:45 AM  Result Value Ref Range   Troponin I 0.03 (HH) <0.03 ng/mL    Comment: CRITICAL RESULT CALLED TO, READ BACK BY AND VERIFIED WITH [facility]E [facility] AT 0911 06/06/2018 DAS Performed at [facility], [address] [place], [place] [address]   Protime-INR  Status: None   Collection Time: 06/06/18  8:45 AM  Result Value Ref Range   Prothrombin Time 12.7 11.4 - 15.2 seconds   INR 0.96     Comment: Performed at [facility], [address] [place], [place] [address]  APTT     Status: None   Collection Time: 06/06/18  8:45 AM  Result Value Ref Range   aPTT 31 24 - 36 seconds    Comment: Performed at [facility], [address] [place], [place] [address]   Dg Chest Portable 1 View  Result Date: 06/06/2018 CLINICAL DATA:  Chest pain EXAM: PORTABLE CHEST 1 VIEW COMPARISON:  May 13, 2018. FINDINGS: There is no edema or consolidation. Heart is mildly enlarged with pulmonary vascularity normal. No adenopathy. There is aortic atherosclerosis. There is evidence of previous coronary artery bypass grafting. There is postoperative change in the left cervical-thoracic junction region. IMPRESSION: No edema or consolidation. Cardiomegaly. Status post coronary artery bypass grafting. There is aortic  atherosclerosis. Aortic Atherosclerosis (ICD10-I70.0). Electronically Signed   By: Bretta BangWilliam  Woodruff III M.D.   On: 06/06/2018 09:04    Pending Labs Unresulted Labs (From admission, onward)   None      Vitals/Pain Today's Vitals   06/06/18 1000 06/06/18 1015 06/06/18 1030 06/06/18 1045  BP: (!) 83/64 90/72 92/73    Pulse: (!) 52 88 (!) 123 (!) 108  Resp: (!) 9 17 17 20   SpO2: 97% 94% 96% 97%  Weight:      Height:      PainSc:        Isolation Precautions No active isolations  Medications Medications  sodium chloride 0.9 % bolus 250 mL (250 mLs Intravenous New Bag/Given 06/06/18 1015)  amiodarone (NEXTERONE PREMIX) 360-4.14 MG/200ML-% (1.8 mg/mL) IV infusion (60 mg/hr Intravenous New Bag/Given 06/06/18 1023)    Followed by  amiodarone (NEXTERONE PREMIX) 360-4.14 MG/200ML-% (1.8 mg/mL) IV infusion (has no administration in time range)  sodium chloride flush (NS) 0.9 % injection 3 mL (3 mLs Intravenous Given 06/06/18 0910)  aspirin chewable tablet 324 mg (324 mg Oral Given 06/06/18 0859)  metoprolol tartrate (LOPRESSOR) injection 5 mg (5 mg Intravenous Given 06/06/18 0859)  metoprolol tartrate (LOPRESSOR) injection 2.5 mg (2.5 mg Intravenous Given 06/06/18 0920)  amiodarone (NEXTERONE) 1.8 mg/mL load via infusion 150 mg (150 mg Intravenous Bolus from Bag 06/06/18 1023)    Mobility walks

## 2018-06-07 DIAGNOSIS — I4581 Long QT syndrome: Secondary | ICD-10-CM

## 2018-06-07 LAB — GLUCOSE, CAPILLARY
Glucose-Capillary: 169 mg/dL — ABNORMAL HIGH (ref 70–99)
Glucose-Capillary: 125 mg/dL — ABNORMAL HIGH (ref 70–99)
Glucose-Capillary: 146 mg/dL — ABNORMAL HIGH (ref 70–99)
Glucose-Capillary: 139 mg/dL — ABNORMAL HIGH (ref 70–99)

## 2018-06-07 LAB — COMPREHENSIVE METABOLIC PANEL
ALT: 52 U/L — ABNORMAL HIGH (ref 0–44)
AST: 38 U/L (ref 15–41)
Albumin: 3.7 g/dL (ref 3.5–5.0)
Alkaline Phosphatase: 65 U/L (ref 38–126)
Anion gap: 7 (ref 5–15)
BILIRUBIN TOTAL: 0.5 mg/dL (ref 0.3–1.2)
BUN: 21 mg/dL — ABNORMAL HIGH (ref 6–20)
CO2: 27 mmol/L (ref 22–32)
Calcium: 8.2 mg/dL — ABNORMAL LOW (ref 8.9–10.3)
Chloride: 105 mmol/L (ref 98–111)
Creatinine, Ser: 1.11 mg/dL (ref 0.61–1.24)
GFR calc Af Amer: >60 mL/min (ref >60)
GFR calc non Af Amer: >60 mL/min (ref >60)
Glucose, Bld: 149 mg/dL — ABNORMAL HIGH (ref 70–99)
Potassium: 3.5 mmol/L (ref 3.5–5.1)
Sodium: 139 mmol/L (ref 135–145)
Total Protein: 7 g/dL (ref 6.5–8.1)

## 2018-06-07 LAB — CBC
HCT: 35.6 % — ABNORMAL LOW (ref 39.0–52.0)
Hemoglobin: 11.2 g/dL — ABNORMAL LOW (ref 13.0–17.0)
MCH: 27.9 pg (ref 26.0–34.0)
MCHC: 31.5 g/dL (ref 30.0–36.0)
MCV: 88.8 fL (ref 80.0–100.0)
Platelets: 285 10*3/uL (ref 150–400)
RBC: 4.01 MIL/uL — ABNORMAL LOW (ref 4.22–5.81)
RDW: 13.7 % (ref 11.5–15.5)
WBC: 8.9 10*3/uL (ref 4.0–10.5)
nRBC: 0 % (ref 0.0–0.2)

## 2018-06-07 MED ORDER — AMIODARONE HCL 200 MG PO TABS
200.0000 mg | ORAL_TABLET | Freq: Two times a day (BID) | ORAL | Status: DC
  Administered 2018-06-07 – 2018-06-08 (×3): 200 mg via ORAL
  Filled 2018-06-07 (×2): qty 1

## 2018-06-07 MED ORDER — ATORVASTATIN CALCIUM 20 MG PO TABS
40.0000 mg | ORAL_TABLET | Freq: Every day | ORAL | Status: DC
  Administered 2018-06-07: 40 mg via ORAL
  Filled 2018-06-07: qty 2

## 2018-06-07 NOTE — Progress Notes (Signed)
[employer] [facility] [place] [facility]   PATIENT NAME: Craig PernaJose Reese    MR#:  [id number]  DATE OF BIRTH:  Sep 30, 1960  SUBJECTIVE:  CHIEF COMPLAINT:   Chief Complaint  Patient presents with  . Chest Pain   No new complaint this morning.  No chest pain this morning.  No fevers.  Still feels some mild palpitations.  Amiodarone drip already discontinued.  Seen by cardiologist with recommendations this morning.  REVIEW OF SYSTEMS:  Review of Systems  Constitutional: Negative for chills and fever.  HENT: Negative for hearing loss and tinnitus.   Eyes: Negative for blurred vision and photophobia.  Respiratory: Negative for cough, hemoptysis and sputum production.   Cardiovascular: Positive for palpitations. Negative for chest pain.  Gastrointestinal: Negative for heartburn and nausea.  Genitourinary: Negative for dysuria and urgency.  Musculoskeletal: Negative for myalgias and neck pain.  Skin: Negative for itching and rash.  Neurological: Negative for dizziness, tingling and headaches.  Psychiatric/Behavioral: Negative for hallucinations.    DRUG ALLERGIES:  No Known Allergies VITALS:  Blood pressure (!) 151/83, pulse 75, temperature 97.8 F (36.6 C), temperature source Oral, resp. rate 20, height 5\' 10"  (1.778 m), weight 125.9 kg, SpO2 95 %. PHYSICAL EXAMINATION:   Physical Exam  Constitutional: He is oriented to person, place, and time and well-developed, well-nourished, and in no distress.  HENT:  Head: Normocephalic and atraumatic.  Eyes: Pupils are equal, round, and reactive to light. Conjunctivae and EOM are normal.  Neck: Normal range of motion. Neck supple. No thyromegaly present.  Cardiovascular: Normal heart sounds.  Irregularly irregular.  Pulmonary/Chest: Effort normal and breath sounds normal. No respiratory distress. He has no wheezes.  Abdominal: Soft. Bowel sounds are normal. He exhibits no distension. There is no abdominal tenderness.    Musculoskeletal: Normal range of motion.        General: No edema.  Neurological: He is alert and oriented to person, place, and time. No cranial nerve deficit.  Skin: Skin is warm. He is not diaphoretic. No erythema.  Psychiatric: Affect and judgment normal.   LABORATORY PANEL:  Male CBC Recent Labs  Lab 06/07/18 0417  WBC 8.9  HGB 11.2*  HCT 35.6*  PLT 285   ------------------------------------------------------------------------------------------------------------------ Chemistries  Recent Labs  Lab 06/06/18 1204 06/07/18 0417  NA  --  139  K  --  3.5  CL  --  105  CO2  --  27  GLUCOSE  --  149*  BUN  --  21*  CREATININE  --  1.11  CALCIUM  --  8.2*  MG 2.0  --   AST  --  38  ALT  --  52*  ALKPHOS  --  65  BILITOT  --  0.5   RADIOLOGY:  No results found. ASSESSMENT AND PLAN:   1.Atrial fibrillation with RVR Admitted to telemetry and patient was started on amiodarone drip last night. Already weaned off amiodarone drip this morning and heart rate remains controlled.  Seen by cardiologist Dr. Gwen PoundsKowalski this morning who recommended initiating amiodarone at 200 mg p.o. twice daily.  This was started this morning. Continue anticoagulation with Xarelto for stroke prophylaxis. Patient on Toprol for hypertension which also is helpful for rate control. No further cardiac diagnostics necessary at this time by cardiology. Recommendation is for patient to ambulate and monitor for symptoms with ambulation.  Physical therapy consult placed.  2.Chest pain- Secondary to A. fib with RVR Appear resolved this morning.  Acute coronary syndrome  ruled out. Continue aspirin, metoprolol , statins and monitor clinically  3. Chronic systolic congestive heart failure Stable.  Monitor clinically  4. Diabetes mellitus Hold metformin and continue Amaryl Sliding scale insulin  5. 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  Patient was recommended to follow-up with [facility] cardiology as an outpatient during previous admission in December 2019 Seen by cardiologist.  Appreciate input.  Outpatient follow-up with cardiologist on discharge  DVT prophylaxis on Xarelto  Disposition; plans to discharge patient home in a.m. if patient ambulates well with physical therapy and remains asymptomatic with current regimen   All the records are reviewed and case discussed with Care Management/Social Worker. Management plans discussed with the patient, family and he is in agreement.  CODE STATUS: Full Code  TOTAL TIME TAKING CARE OF THIS PATIENT: 36 minutes.   More than 50% of the time was spent in counseling/coordination of care: YES  POSSIBLE D/C IN 1 DAY, DEPENDING ON CLINICAL CONDITION.   Chiquetta Langner M.D on 06/07/2018 at 11:20 AM  Between 7am to 6pm - Pager - [phone]  After 6pm go to [email or web address] - Scientist, research (life sciences) [facility]  Office  [phone]  CC: Primary care physician; [name]  Note: This dictation was prepared with Nurse, children's dictation along with smaller phrase technology. Any transcriptional errors that result from this process are unintentional.

## 2018-06-07 NOTE — Progress Notes (Signed)
Used the interpreter services on a sick. Interpretor ID number [id number].

## 2018-06-07 NOTE — Progress Notes (Signed)
Nurse paged as patient's telemetry showing increased QTC.  Patient is in sinus rhythm and heart rate is in 60s.  Patient is on amiodarone drip.  Patient is also on metoprolol.  Patient was put on amiodarone drip for A. fib with RVR.  Patient converted to sinus rhythm.  Will discontinue amiodarone drip.  Get twelve-lead EKG for QT monitoring.  Follow-up cardiology recommendations in a.m.

## 2018-06-07 NOTE — Consult Note (Signed)
[facility] Cardiology Consultation Note  Patient ID: Craig Reese, MRN: [id number], DOB/AGE: 08-05-1960 58 y.o. Admit date: 06/06/2018   Date of Consult: 06/07/2018 Primary Physician: [name] Primary Cardiologist: None  Chief Complaint:  Chief Complaint  Patient presents with  . Chest Pain   Reason for Consult: Fibrillation with rapid ventricular rate  HPI: 58 y.o. male with known coronary artery disease status post coronary artery bypass graft as well as aortic valve replacement with essential hypertension mixed hyperlipidemia and paroxysmal nonvalvular atrial fibrillation.  The patient recently was admitted for atrial fibrillation with elevated troponin and had a cardiac catheterization showing three-vessel coronary artery atherosclerosis and the preferred treatment at that time was for further medication management.  The patient did well with appropriate medication management and but did have recurrent episode atrial fibrillation.  When seen in the emergency room yesterday he had atrial fibrillation with rapid ventricular rate.  After additional medication management heart rate control the patient did have some improvements but still had episodes of weakness fatigue shortness of breath and chest discomfort.  The patient's EKG at that time showed atrial fibrillation with rapid ventricular rate and right bundle branch block.  Chest pain was out of proportion true coronary atherosclerosis and troponin elevation was to 0.12 consistent with demand ischemia rather than acute coronary syndrome.  He did receive amiodarone drip and converted to normal sinus rhythm and has been completely pain-free and feeling much better overnight and resting well.  Currently he is hemodynamically stable  Past Medical History:  Diagnosis Date  . Coronary artery disease       Surgical History:  Past Surgical History:  Procedure Laterality Date  . CORONARY ARTERY BYPASS GRAFT    .  LEFT HEART CATH AND CORS/GRAFTS ANGIOGRAPHY N/A 05/17/2018   Procedure: LEFT HEART CATH AND CORS/GRAFTS ANGIOGRAPHY and possible PCI and stent;  Surgeon: Alwyn Pea, MD;  Location: [facility] INVASIVE CV LAB;  Service: Cardiovascular;  Laterality: N/A;     Home Meds: Prior to Admission medications   Medication Sig Start Date End Date Taking? Authorizing Provider  acetaminophen (TYLENOL) 500 MG tablet Take 500 mg by mouth every 6 (six) hours as needed.   Yes [provider]  aspirin 81 MG chewable tablet Chew 81 mg by mouth daily.   Yes [provider]  gabapentin (NEURONTIN) 300 MG capsule Take 1 capsule by mouth 2 (two) times daily. 10/07/17 12/23/18 Yes [provider]  glimepiride (AMARYL) 2 MG tablet Take 1 tablet (2 mg total) by mouth daily with breakfast. 05/18/18  Yes Shaune Pollack, MD  hydrALAZINE (APRESOLINE) 25 MG tablet Take 1 tablet by mouth 3 (three) times daily. 12/23/17  Yes [provider]  levothyroxine (SYNTHROID) 150 MCG tablet Take 1 tablet by mouth daily. 06/16/17 12/23/18 Yes [provider]  metFORMIN (GLUCOPHAGE) 500 MG tablet Take 2 tablets (1,000 mg total) by mouth 2 (two) times daily. 05/17/18 11/23/19 Yes Shaune Pollack, MD  rivaroxaban (XARELTO) 20 MG TABS tablet Take 1 tablet by mouth daily. 05/09/15 07/05/18 Yes [provider]    Inpatient Medications:  . aspirin  81 mg Oral Daily  . gabapentin  300 mg Oral BID  . glimepiride  2 mg Oral Q breakfast  . insulin aspart  0-5 Units Subcutaneous QHS  . insulin aspart  0-9 Units Subcutaneous TID WC  . levothyroxine  150 mcg Oral Daily  . metoprolol tartrate  50 mg Oral BID  . rivaroxaban  20 mg  Oral Daily  . sodium chloride flush  10 mL Intravenous Q12H     Allergies: No Known Allergies  Social History   Socioeconomic History  . Marital status: Single    Spouse name: Not on file  . Number of children: Not on file  . Years of education: Not on file  . Highest  education level: Not on file  Occupational History  . Not on file  Social Needs  . Financial resource strain: Somewhat hard  . Food insecurity:    Worry: Patient refused    Inability: Patient refused  . Transportation needs:    Medical: Patient refused    Non-medical: Patient refused  Tobacco Use  . Smoking status: Never Smoker  . Smokeless tobacco: Never Used  Substance and Sexual Activity  . Alcohol use: Not Currently  . Drug use: Not on file  . Sexual activity: Not Currently  Lifestyle  . Physical activity:    Days per week: Patient refused    Minutes per session: Patient refused  . Stress: To some extent  Relationships  . Social connections:    Talks on phone: Not on file    Gets together: Not on file    Attends religious service: Not on file    Active member of club or organization: Not on file    Attends meetings of clubs or organizations: Not on file    Relationship status: Not on file  . Intimate partner violence:    Fear of current or ex partner: Not on file    Emotionally abused: Not on file    Physically abused: Not on file    Forced sexual activity: Not on file  Other Topics Concern  . Not on file  Social History Narrative  . Not on file     History reviewed. No pertinent family history.   Review of Systems Positive for chest pain shortness of breath palpitations Negative for: General:  chills, fever, night sweats or weight changes.  Cardiovascular: PND orthopnea syncope dizziness  Dermatological skin lesions rashes Respiratory: Cough congestion Urologic: Frequent urination urination at night and hematuria Abdominal: negative for nausea, vomiting, diarrhea, bright red blood per rectum, melena, or hematemesis Neurologic: negative for visual changes, and/or hearing changes  All other systems reviewed and are otherwise negative except as noted above.  Labs: Recent Labs    06/06/18 0845 06/06/18 1204 06/06/18 1645 06/06/18 2237  TROPONINI 0.03*  0.06* 0.11* 0.12*   Lab Results  Component Value Date   WBC 8.9 06/07/2018   HGB 11.2 (L) 06/07/2018   HCT 35.6 (L) 06/07/2018   MCV 88.8 06/07/2018   PLT 285 06/07/2018    Recent Labs  Lab 06/07/18 0417  NA 139  K 3.5  CL 105  CO2 27  BUN 21*  CREATININE 1.11  CALCIUM 8.2*  PROT 7.0  BILITOT 0.5  ALKPHOS 65  ALT 52*  AST 38  GLUCOSE 149*   Lab Results  Component Value Date   CHOL 141 08/08/2013   HDL 22 (L) 08/08/2013   LDLCALC SEE COMMENT 08/08/2013   TRIG 422 (H) 08/08/2013   No results found for: DDIMER  Radiology/Studies:  Dg Ankle Complete Right  Result Date: 05/17/2018 CLINICAL DATA:  Foot and ankle pain EXAM: RIGHT ANKLE - COMPLETE 3+ VIEW COMPARISON:  None. FINDINGS: No fracture or malalignment. Ankle mortise symmetric. Ossicle or old injury adjacent to the fibular malleolus. Minimal spurring medial malleolus. Vascular calcification IMPRESSION: No acute osseous abnormality Electronically Signed  By: Jasmine PangKim  Fujinaga M.D.   On: 05/17/2018 18:02   Dg Chest Portable 1 View  Result Date: 06/06/2018 CLINICAL DATA:  Chest pain EXAM: PORTABLE CHEST 1 VIEW COMPARISON:  May 13, 2018. FINDINGS: There is no edema or consolidation. Heart is mildly enlarged with pulmonary vascularity normal. No adenopathy. There is aortic atherosclerosis. There is evidence of previous coronary artery bypass grafting. There is postoperative change in the left cervical-thoracic junction region. IMPRESSION: No edema or consolidation. Cardiomegaly. Status post coronary artery bypass grafting. There is aortic atherosclerosis. Aortic Atherosclerosis (ICD10-I70.0). Electronically Signed   By: Bretta BangWilliam  Woodruff III M.D.   On: 06/06/2018 09:04   Dg Chest Portable 1 View  Result Date: 05/13/2018 CLINICAL DATA:  Chest pain. EXAM: PORTABLE CHEST 1 VIEW COMPARISON:  Chest x-ray dated Sep 22, 2013. FINDINGS: Lordotic positioning. Mild cardiomegaly status post CABG. Normal pulmonary vascularity. Low  lung volumes with mild bibasilar atelectasis. No focal consolidation, pleural effusion, or pneumothorax. No acute osseous abnormality. IMPRESSION: Cardiomegaly.  Low lung volumes and mild bibasilar atelectasis. Electronically Signed   By: Obie DredgeWilliam T Derry M.D.   On: 05/13/2018 11:04   Dg Foot Complete Right  Result Date: 05/17/2018 CLINICAL DATA:  Foot and ankle pain EXAM: RIGHT FOOT COMPLETE - 3+ VIEW COMPARISON:  None. FINDINGS: There is no evidence of fracture or dislocation. There is no evidence of arthropathy or other focal bone abnormality. Soft tissues are unremarkable. IMPRESSION: Negative. Electronically Signed   By: Jasmine PangKim  Fujinaga M.D.   On: 05/17/2018 18:01   Dg Hip Unilat With Pelvis 2-3 Views Right  Result Date: 05/18/2018 CLINICAL DATA:  Hip pain after heart catheterization. EXAM: DG HIP (WITH OR WITHOUT PELVIS) 2-3V RIGHT COMPARISON:  No prior. FINDINGS: No acute bony abnormality identified. No evidence of fracture dislocation. Peripheral vascular calcification. IMPRESSION: 1.  No acute bony abnormality identified. 2.  Peripheral vascular disease. Electronically Signed   By: Maisie Fushomas  Register   On: 05/18/2018 08:48    EKG: Atrial fibrillation with rapid ventricular rate and right bundle branch block  Weights: Filed Weights   06/06/18 0838 06/06/18 1201 06/07/18 0508  Weight: 122.5 kg 122.9 kg 125.9 kg     Physical Exam: Blood pressure (!) 135/96, pulse 71, temperature (!) 97.5 F (36.4 C), temperature source Oral, resp. rate 20, height 5\' 10"  (1.778 m), weight 125.9 kg, SpO2 96 %. Body mass index is 39.82 kg/m. General: Well developed, well nourished, in no acute distress. Head eyes ears nose throat: Normocephalic, atraumatic, sclera non-icteric, no xanthomas, nares are without discharge. No apparent thyromegaly and/or mass  Lungs: Normal respiratory effort.  no wheezes, no rales, no rhonchi.  Heart: RRR with normal S1 S2.  2+ right upper sternal border murmur gallop, no rub,  PMI is normal size and placement, carotid upstroke normal without bruit, jugular venous pressure is normal Abdomen: Soft, non-tender, non-distended with normoactive bowel sounds. No hepatomegaly. No rebound/guarding. No obvious abdominal masses. Abdominal aorta is normal size without bruit Extremities: No edema. no cyanosis, no clubbing, no ulcers  Peripheral : 2+ bilateral upper extremity pulses, 2+ bilateral femoral pulses, 2+ bilateral dorsal pedal pulse Neuro: Alert and oriented. No facial asymmetry. No focal deficit. Moves all extremities spontaneously. Musculoskeletal: Normal muscle tone without kyphosis Psych:  Responds to questions appropriately with a normal affect.    Assessment: 58 year old male with known coronary disease status post coronary bypass graft essential hypertension mixed hyperlipidemia aortic valve replacement with paroxysmal nonvalvular atrial fibrillation with rapid ventricular rate causing chest discomfort and  no current evidence of congestive heart failure or myocardial infarction  Plan: 1.  Continue amiodarone treatment and change to oral amiodarone at 200 mg twice per day 2.  Continue anticoagulation for further risk reduction in stroke with paroxysmal nonvalvular atrial fibrillation 3.  Toprol for coronary artery disease hypertension 4.  High intensity cholesterol therapy for coronary atherosclerosis 5.  Aspirin for coronary artery disease 6.  No further cardiac diagnostics necessary at this time 7.  Begin ambulation and follow for improvements of symptoms and possible discharge home today if ambulating well on appropriate medication management remaining in normal sinus rhythm with follow-up in the next few weeks for further adjustments of medication management  Signed, Lamar Blinks M.D. [facility] Cardiology 06/07/2018, 7:10 AM

## 2018-06-07 NOTE — Plan of Care (Signed)
  Problem: Clinical Measurements: Goal: Cardiovascular complication will be avoided Outcome: Progressing   Problem: Activity: Goal: Risk for activity intolerance will decrease Outcome: Progressing   Problem: Nutrition: Goal: Adequate nutrition will be maintained Outcome: Progressing   

## 2018-06-07 NOTE — Evaluation (Signed)
Physical Therapy Evaluation Patient Details Name: Craig Reese MRN: [id number] DOB: [birth year] Today's Date: 06/07/2018   History of Present Illness  Patient is a 58 year old male admitted from home with chest pain. Admitted with Afib and RVR. PMH to include CAD, Chronic A fib, DM, CHF, HTN, S/P CABG, aortic valve replacement.     Clinical Impression  Patient received in bed. Agrees to PT evaluation. Reports he has been up to restroom. Reports mild fatigue. Spanish interpreter present throughout evaluation. Patient demonstrates independence with bed mobility and transfers. Ambulated 200 feet and up/down 6 steps with rail, min guard/supervision only. Patient has not difficulties during activity, reports LE weakness only, no LOB, no SOB or chest pain reported. Patient does not require further skilled PT intervention at this time and will be able to return home with brother when medically ready.       Follow Up Recommendations No PT follow up    Equipment Recommendations  None recommended by PT    Recommendations for Other Services       Precautions / Restrictions Precautions Precautions: Fall Restrictions Weight Bearing Restrictions: No      Mobility  Bed Mobility Overal bed mobility: Independent                Transfers Overall transfer level: Independent Equipment used: None                Ambulation/Gait Ambulation/Gait assistance: Independent;Min guard Gait Distance (Feet): 200 Feet Assistive device: None Gait Pattern/deviations: Step-through pattern Gait velocity: decreased      Stairs Stairs: Yes Stairs assistance: Supervision;Min guard Stair Management: One rail Right Number of Stairs: 6 General stair comments: no difficulties  Wheelchair Mobility    Modified Rankin (Stroke Patients Only)       Balance Overall balance assessment: Independent;No apparent balance deficits (not formally assessed)                                           Pertinent Vitals/Pain Pain Assessment: No/denies pain    Home Living Family/patient expects to be discharged to:: Private residence Living Arrangements: Other relatives Available Help at Discharge: Family Type of Home: House Home Access: Stairs to enter Entrance Stairs-Rails: Doctor, general practice of Steps: 4 Home Layout: One level Home Equipment: None      Prior Function Level of Independence: Independent               Hand Dominance        Extremity/Trunk Assessment   Upper Extremity Assessment Upper Extremity Assessment: Overall WFL for tasks assessed    Lower Extremity Assessment Lower Extremity Assessment: Overall WFL for tasks assessed;Generalized weakness       Communication   Communication: Prefers language other than English  Cognition Arousal/Alertness: Awake/alert Behavior During Therapy: WFL for tasks assessed/performed Overall Cognitive Status: Within Functional Limits for tasks assessed                                        General Comments      Exercises     Assessment/Plan    PT Assessment Patent does not need any further PT services  PT Problem List Decreased strength;Decreased activity tolerance       PT Treatment Interventions      PT Goals (  Current goals can be found in the Care Plan section)  Acute Rehab PT Goals Patient Stated Goal: to return home PT Goal Formulation: With patient Time For Goal Achievement: 06/21/18 Potential to Achieve Goals: Good    Frequency     Barriers to discharge        Co-evaluation               AM-PAC PT "6 Clicks" Mobility  Outcome Measure Help needed turning from your back to your side while in a flat bed without using bedrails?: None Help needed moving from lying on your back to sitting on the side of a flat bed without using bedrails?: None Help needed moving to and from a bed to a chair (including a wheelchair)?: None Help needed  standing up from a chair using your arms (e.g., wheelchair or bedside chair)?: None Help needed to walk in hospital room?: None Help needed climbing 3-5 steps with a railing? : None 6 Click Score: 24    End of Session Equipment Utilized During Treatment: Gait belt Activity Tolerance: Patient tolerated treatment well;Patient limited by fatigue Patient left: in chair;with call bell/phone within reach Nurse Communication: Mobility status PT Visit Diagnosis: Muscle weakness (generalized) (M62.81)    Time: 1045-1110 PT Time Calculation (min) (ACUTE ONLY): 25 min   Charges:   PT Evaluation $PT Eval Low Complexity: 1 Low PT Treatments $Gait Training: 8-22 mins        Geanette Buonocore, PT, GCS 06/07/18,11:25 AM

## 2018-06-08 LAB — GLUCOSE, CAPILLARY
Glucose-Capillary: 134 mg/dL — ABNORMAL HIGH (ref 70–99)
Glucose-Capillary: 129 mg/dL — ABNORMAL HIGH (ref 70–99)

## 2018-06-08 LAB — MAGNESIUM: Magnesium: 2.1 mg/dL (ref 1.7–2.4)

## 2018-06-08 LAB — BASIC METABOLIC PANEL
Anion gap: 6 (ref 5–15)
BUN: 18 mg/dL (ref 6–20)
CO2: 30 mmol/L (ref 22–32)
Calcium: 8.6 mg/dL — ABNORMAL LOW (ref 8.9–10.3)
Chloride: 102 mmol/L (ref 98–111)
Creatinine, Ser: 1.25 mg/dL — ABNORMAL HIGH (ref 0.61–1.24)
GFR calc Af Amer: >60 mL/min (ref >60)
GFR calc non Af Amer: >60 mL/min (ref >60)
Glucose, Bld: 147 mg/dL — ABNORMAL HIGH (ref 70–99)
Potassium: 3.9 mmol/L (ref 3.5–5.1)
Sodium: 138 mmol/L (ref 135–145)

## 2018-06-08 MED ORDER — ATORVASTATIN CALCIUM 40 MG PO TABS: 40.0000 mg | ORAL_TABLET | Freq: Every day | ORAL | 0 refills | Status: DC

## 2018-06-08 MED ORDER — METOPROLOL TARTRATE 50 MG PO TABS: 50.0000 mg | ORAL_TABLET | Freq: Two times a day (BID) | ORAL | 0 refills | Status: DC

## 2018-06-08 MED ORDER — HYDRALAZINE HCL 20 MG/ML IJ SOLN: 10.0000 mg | INTRAMUSCULAR | Status: DC | PRN

## 2018-06-08 MED ORDER — HYDRALAZINE HCL 25 MG PO TABS
25.0000 mg | ORAL_TABLET | Freq: Three times a day (TID) | ORAL | Status: DC
  Administered 2018-06-08: 25 mg via ORAL

## 2018-06-08 MED ORDER — AMIODARONE HCL 200 MG PO TABS: 200.0000 mg | ORAL_TABLET | Freq: Two times a day (BID) | ORAL | 0 refills | Status: DC

## 2018-06-08 NOTE — Care Management Note (Signed)
Case Management Note  Patient Details  Name: Craig Reese MRN: [id number] Date of Birth: 02/02/1961  Subjective/Objective:   Patient is from home and independent.  Admitted with atrial fibrillation.  Utilized Catering manager.  [facility] with [facility] for [facility].  Obtains prescriptions by mail from [name] with medication assistance.  He does not have insurance.  Faxed prescriptions to [name] and [name] as he cannot wait a week to receive medications by mail.  Craig Reese at [name] said she can fill a 1 month supply of discharge medications.  Patient is aware that he needs to call the [name] to verify prescriptions before they will fill and mail.  His family member is on the way to pick him up shortly.  No further needs identified at this time.                    Action/Plan:   Expected Discharge Date:  06/08/18               Expected Discharge Plan:  Home/Self Care  In-House Referral:     Discharge planning Services  CM Consult, Medication Assistance  Post Acute Care Choice:    Choice offered to:     DME Arranged:    DME Agency:     HH Arranged:    HH Agency:     Status of Service:  Completed, signed off  If discussed at [employer] of Stay Meetings, dates discussed:    Additional Comments:  Craig Kerns, RN 06/08/2018, 2:15 PM

## 2018-06-08 NOTE — Discharge Summary (Signed)
[facility] [place] [facility]   PATIENT NAME: Craig Reese    MR#:  [id number]  DATE OF BIRTH:  1961-02-11  DATE OF ADMISSION:  06/06/2018   ADMITTING PHYSICIAN: Ramonita Lab, MD  DATE OF DISCHARGE: 06/08/2018  PRIMARY CARE PHYSICIAN: [name]   ADMISSION DIAGNOSIS:  Atrial fibrillation with rapid ventricular response (HCC) [I48.91] Chest pain, moderate coronary artery risk [R07.9] DISCHARGE DIAGNOSIS:  Active Problems:   Atrial fibrillation with RVR (HCC)  SECONDARY DIAGNOSIS:   Past Medical History:  Diagnosis Date  . Coronary artery disease    HOSPITAL COURSE:  Chief complaint; Chest pain and shortness of breath  HPI Diamonte Tugman  is a 58 y.o. male with a known history of coronary artery disease, chronic atrial fibrillation on Xarelto, diabetes mellitus, systolic congestive heart failure who presented to the ED with a chief complaint of chest pain which was started at 5:30 AM associated with his palpitations.  He is in atrial fibrillation with RVR, and placed on amiodarone drip by the ED physician after discussing with cardiologist.  Patient is reporting midsternal chest pain history with pressure and discomfort which is nonradiating.  Patient was admitted to medical service for further evaluation.  Please refer to the H&P dictated for further details.    Discussed with patient extensively including plan of care with using the Spanish interpreter  HOSPITAL COURSE; 1.Atrial fibrillation with RVR Admitted to telemetry and patient was started on amiodarone drip.  Patient subsequently weaned off amiodarone drip. Seen by cardiologist Dr. Gwen Pounds during this admission who recommended initiating amiodarone at 200 mg p.o. twice daily which has been started.Continue anticoagulation with Xarelto for stroke prophylaxis. Patient on Toprol for hypertension which also is helpful for rate control.No further cardiac diagnostics  necessary at this time by cardiology.  Patient ambulated well with physical therapist.  Ambulated at least 200 feet.  No further skilled physical therapy needs recommended.  Back to baseline.  Appointment made to follow-up with cardiologist in 1 week.  2.Chest pain- Secondary to A. fib with RVR.  Resolved.  Acute coronary syndrome ruled out. Continue aspirin, metoprolol , statins and monitor clinically  3. Chronic systolic congestive heart failure Stable.  Monitor clinically  4. Diabetes mellitus Resumed home regimen.  Outpatient monitoring by primary care physician  5. 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 Patient was recommended to follow-up with [facility] cardiology as an outpatient during previous admission in December 2019 Seen by cardiologist.  Appreciate input.  Outpatient follow-up with cardiologist on discharge    DISCHARGE CONDITIONS:  Stable CONSULTS OBTAINED:  Treatment Team:  Lamar Blinks, MD DRUG ALLERGIES:  No Known Allergies DISCHARGE MEDICATIONS:   Allergies as of 06/08/2018   No Known Allergies     Medication List    TAKE these medications   acetaminophen 500 MG tablet Commonly known as:  TYLENOL Take 500 mg by mouth every 6 (six) hours as needed.   amiodarone 200 MG tablet Commonly known as:  PACERONE Take 1 tablet (200 mg total) by mouth 2 (two) times daily.   aspirin 81 MG chewable tablet Chew 81 mg by mouth daily.   atorvastatin 40 MG tablet Commonly known as:  LIPITOR Take 1 tablet (40 mg total) by mouth daily at 6 PM.  gabapentin 300 MG capsule Commonly known as:  NEURONTIN Take 1 capsule by mouth 2 (two) times daily.   glimepiride 2 MG tablet Commonly known as:  AMARYL Take 1 tablet (2 mg total) by mouth daily with  breakfast.   hydrALAZINE 25 MG tablet Commonly known as:  APRESOLINE Take 1 tablet by mouth 3 (three) times daily.   metFORMIN 500 MG tablet Commonly known as:  GLUCOPHAGE Take 2 tablets (1,000 mg total) by mouth 2 (two) times daily.   metoprolol tartrate 50 MG tablet Commonly known as:  LOPRESSOR Take 1 tablet (50 mg total) by mouth 2 (two) times daily.   rivaroxaban 20 MG Tabs tablet Commonly known as:  XARELTO Take 1 tablet by mouth daily.   SYNTHROID 150 MCG tablet Generic drug:  levothyroxine Take 1 tablet by mouth daily.        DISCHARGE INSTRUCTIONS:   DIET:  1800 ADA diet DISCHARGE CONDITION:  Stable ACTIVITY:  Activity as tolerated OXYGEN:  Home Oxygen: No.  Oxygen Delivery: room air DISCHARGE LOCATION:  home   If you experience worsening of your admission symptoms, develop shortness of breath, life threatening emergency, suicidal or homicidal thoughts you must seek medical attention immediately by calling 911 or calling your MD immediately  if symptoms less severe.  You Must read complete instructions/literature along with all the possible adverse reactions/side effects for all the Medicines you take and that have been prescribed to you. Take any new Medicines after you have completely understood and accpet all the possible adverse reactions/side effects.   Please note  You were cared for by a hospitalist during your hospital stay. If you have any questions about your discharge medications or the care you received while you were in the hospital after you are discharged, you can call the unit and asked to speak with the hospitalist on call if the hospitalist that took care of you is not available. Once you are discharged, your primary care physician will handle any further medical issues. Please note that NO REFILLS for any discharge medications will be authorized once you are discharged, as it is imperative that you return to your primary care physician (or  establish a relationship with a primary care physician if you do not have one) for your aftercare needs so that they can reassess your need for medications and monitor your lab values.    On the day of Discharge:  VITAL SIGNS:  Blood pressure (!) 174/104, pulse 71, temperature 97.7 F (36.5 C), temperature source Oral, resp. rate 20, height 5\' 10"  (1.778 m), weight 124.7 kg, SpO2 98 %. PHYSICAL EXAMINATION:  GENERAL:  58 y.o.-year-old patient lying in the bed with no acute distress.  EYES: Pupils equal, round, reactive to light and accommodation. No scleral icterus. Extraocular muscles intact.  HEENT: Head atraumatic, normocephalic. Oropharynx and nasopharynx clear.  NECK:  Supple, no jugular venous distention. No thyroid enlargement, no tenderness.  LUNGS: Normal breath sounds bilaterally, no wheezing, rales,rhonchi or crepitation. No use of accessory muscles of respiration.  CARDIOVASCULAR: S1, S2 normal. No murmurs, rubs, or gallops.  ABDOMEN: Soft, non-tender, non-distended. Bowel sounds present. No organomegaly or mass.  EXTREMITIES: No pedal edema, cyanosis, or clubbing.  NEUROLOGIC: Cranial nerves II through XII are intact. Muscle strength 5/5 in all extremities. Sensation intact. Gait not checked.  PSYCHIATRIC: The patient is alert and oriented x 3.  SKIN: No obvious rash, lesion, or ulcer.  DATA REVIEW:   CBC Recent Labs  Lab 06/07/18 0417  WBC  8.9  HGB 11.2*  HCT 35.6*  PLT 285    Chemistries  Recent Labs  Lab 06/07/18 0417 06/08/18 0453  NA 139 138  K 3.5 3.9  CL 105 102  CO2 27 30  GLUCOSE 149* 147*  BUN 21* 18  CREATININE 1.11 1.25*  CALCIUM 8.2* 8.6*  MG  --  2.1  AST 38  --   ALT 52*  --   ALKPHOS 65  --   BILITOT 0.5  --      Microbiology Results  No results found for this or any previous visit.  RADIOLOGY:  No results found.   Management plans discussed with the patient, family and they are in agreement.  CODE STATUS: Full Code   TOTAL  TIME TAKING CARE OF THIS PATIENT: 40 minutes.    Tomothy Eddins M.D on 06/08/2018 at 9:54 AM  Between 7am to 6pm - Pager - [phone]  After 6pm go to [email or web address] - Scientist, research (life sciences)password EPAS [facility]  [facility]  Office  [phone]  CC: Primary care physician; [facility]   Note: This dictation was prepared with Nurse, children'sDragon dictation along with smaller phrase technology. Any transcriptional errors that result from this process are unintentional.

## 2018-06-08 NOTE — Progress Notes (Signed)
Pt to be discharged to home this afternoon. Iv's and tele removed. dsich instructions given to pt with interpreter present. Medicines provided by care management. Awaiting pt's ride.

## 2018-07-14 ENCOUNTER — Telehealth: Payer: Self-pay | Admitting: Pharmacy Technician

## 2018-07-14 NOTE — Telephone Encounter (Signed)
Patient failed to provide 2020 financial documentation.  No additional medication assistance will be provided by [facility] without the required proof of income documentation.  Patient notified by letter.  Treana Lacour J. Daichi Moris Care Manager [facility] 

## 2018-11-16 ENCOUNTER — Emergency Department
Admission: EM | Admit: 2018-11-16 | Discharge: 2018-11-17 | Disposition: A | Discharge status: Other Acute Inpatient Hospital | Payer: HRSA Program | Attending: Student in an Organized Health Care Education/Training Program | Admitting: Student in an Organized Health Care Education/Training Program

## 2018-11-16 ENCOUNTER — Encounter: Payer: Self-pay | Admitting: Emergency Medicine

## 2018-11-16 ENCOUNTER — Emergency Department: Payer: HRSA Program

## 2018-11-16 ENCOUNTER — Other Ambulatory Visit: Payer: Self-pay

## 2018-11-16 DIAGNOSIS — Z8679 Personal history of other diseases of the circulatory system: Secondary | ICD-10-CM

## 2018-11-16 DIAGNOSIS — Z951 Presence of aortocoronary bypass graft: Secondary | ICD-10-CM | POA: Insufficient documentation

## 2018-11-16 DIAGNOSIS — Z7901 Long term (current) use of anticoagulants: Secondary | ICD-10-CM | POA: Diagnosis not present

## 2018-11-16 DIAGNOSIS — J9601 Acute respiratory failure with hypoxia: Secondary | ICD-10-CM | POA: Insufficient documentation

## 2018-11-16 DIAGNOSIS — Z7984 Long term (current) use of oral hypoglycemic drugs: Secondary | ICD-10-CM | POA: Diagnosis not present

## 2018-11-16 DIAGNOSIS — Z79899 Other long term (current) drug therapy: Secondary | ICD-10-CM | POA: Insufficient documentation

## 2018-11-16 DIAGNOSIS — U071 COVID-19: Secondary | ICD-10-CM | POA: Diagnosis not present

## 2018-11-16 DIAGNOSIS — R079 Chest pain, unspecified: Secondary | ICD-10-CM | POA: Diagnosis present

## 2018-11-16 DIAGNOSIS — I2510 Atherosclerotic heart disease of native coronary artery without angina pectoris: Secondary | ICD-10-CM | POA: Diagnosis not present

## 2018-11-16 DIAGNOSIS — I161 Hypertensive emergency: Secondary | ICD-10-CM

## 2018-11-16 DIAGNOSIS — I480 Paroxysmal atrial fibrillation: Secondary | ICD-10-CM

## 2018-11-16 DIAGNOSIS — Z952 Presence of prosthetic heart valve: Secondary | ICD-10-CM

## 2018-11-16 LAB — CBC WITH DIFFERENTIAL/PLATELET
Abs Immature Granulocytes: 0.02 10*3/uL (ref 0.00–0.07)
Basophils Absolute: 0 10*3/uL (ref 0.0–0.1)
Basophils Relative: 0 %
Eosinophils Absolute: 0.1 10*3/uL (ref 0.0–0.5)
Eosinophils Relative: 1 %
HCT: 36.5 % — ABNORMAL LOW (ref 39.0–52.0)
Hemoglobin: 12 g/dL — ABNORMAL LOW (ref 13.0–17.0)
Immature Granulocytes: 0 %
Lymphocytes Relative: 16 %
Lymphs Abs: 1.3 10*3/uL (ref 0.7–4.0)
MCH: 28.9 pg (ref 26.0–34.0)
MCHC: 32.9 g/dL (ref 30.0–36.0)
MCV: 88 fL (ref 80.0–100.0)
Monocytes Absolute: 0.4 10*3/uL (ref 0.1–1.0)
Monocytes Relative: 4 %
Neutro Abs: 6.5 10*3/uL (ref 1.7–7.7)
Neutrophils Relative %: 79 %
Platelets: 164 10*3/uL (ref 150–400)
RBC: 4.15 MIL/uL — ABNORMAL LOW (ref 4.22–5.81)
RDW: 14.2 % (ref 11.5–15.5)
WBC: 8.3 10*3/uL (ref 4.0–10.5)
nRBC: 0 % (ref 0.0–0.2)

## 2018-11-16 LAB — COMPREHENSIVE METABOLIC PANEL
ALT: 61 U/L — ABNORMAL HIGH (ref 0–44)
AST: 54 U/L — ABNORMAL HIGH (ref 15–41)
Albumin: 4.2 g/dL (ref 3.5–5.0)
Alkaline Phosphatase: 80 U/L (ref 38–126)
Anion gap: 12 (ref 5–15)
BUN: 22 mg/dL — ABNORMAL HIGH (ref 6–20)
CO2: 25 mmol/L (ref 22–32)
Calcium: 8.2 mg/dL — ABNORMAL LOW (ref 8.9–10.3)
Chloride: 103 mmol/L (ref 98–111)
Creatinine, Ser: 1.22 mg/dL (ref 0.61–1.24)
GFR calc Af Amer: >60 mL/min (ref >60)
GFR calc non Af Amer: >60 mL/min (ref >60)
Glucose, Bld: 134 mg/dL — ABNORMAL HIGH (ref 70–99)
Potassium: 3.2 mmol/L — ABNORMAL LOW (ref 3.5–5.1)
Sodium: 140 mmol/L (ref 135–145)
Total Bilirubin: 0.5 mg/dL (ref 0.3–1.2)
Total Protein: 8 g/dL (ref 6.5–8.1)

## 2018-11-16 LAB — TROPONIN I (HIGH SENSITIVITY)
Troponin I (High Sensitivity): 25 ng/L — ABNORMAL HIGH (ref <18)
Troponin I (High Sensitivity): 30 ng/L — ABNORMAL HIGH (ref <18)

## 2018-11-16 LAB — LIPASE, BLOOD: Lipase: 30 U/L (ref 11–51)

## 2018-11-16 LAB — BRAIN NATRIURETIC PEPTIDE: B Natriuretic Peptide: 56 pg/mL (ref 0.0–100.0)

## 2018-11-16 LAB — APTT: aPTT: 28 seconds (ref 24–36)

## 2018-11-16 LAB — SARS CORONAVIRUS 2 BY RT PCR (HOSPITAL ORDER, PERFORMED IN ~~LOC~~ HOSPITAL LAB): SARS Coronavirus 2: POSITIVE — AB

## 2018-11-16 LAB — PROTIME-INR
INR: 1 (ref 0.8–1.2)
Prothrombin Time: 13.3 seconds (ref 11.4–15.2)

## 2018-11-16 MED ORDER — NITROGLYCERIN 2 % TD OINT
1.0000 [in_us] | TOPICAL_OINTMENT | Freq: Once | TRANSDERMAL | Status: AC
  Administered 2018-11-16: 1 [in_us] via TOPICAL
  Filled 2018-11-16: qty 1

## 2018-11-16 MED ORDER — HEPARIN BOLUS VIA INFUSION
4000.0000 [IU] | Freq: Once | INTRAVENOUS | Status: AC
  Administered 2018-11-16: 4000 [IU] via INTRAVENOUS
  Filled 2018-11-16: qty 4000

## 2018-11-16 MED ORDER — MORPHINE SULFATE (PF) 4 MG/ML IV SOLN
INTRAVENOUS | Status: AC
  Administered 2018-11-16: 4 mg via INTRAVENOUS
  Filled 2018-11-16: qty 1

## 2018-11-16 MED ORDER — MORPHINE SULFATE (PF) 4 MG/ML IV SOLN
4.0000 mg | Freq: Once | INTRAVENOUS | Status: AC
  Administered 2018-11-16: 4 mg via INTRAVENOUS

## 2018-11-16 MED ORDER — ONDANSETRON HCL 4 MG/2ML IJ SOLN
4.0000 mg | Freq: Once | INTRAMUSCULAR | Status: AC
  Administered 2018-11-16: 4 mg via INTRAVENOUS

## 2018-11-16 MED ORDER — HEPARIN (PORCINE) 25000 UT/250ML-% IV SOLN
1300.0000 [IU]/h | INTRAVENOUS | Status: DC
  Filled 2018-11-16: qty 250

## 2018-11-16 MED ORDER — METOPROLOL TARTRATE 50 MG PO TABS
50.0000 mg | ORAL_TABLET | Freq: Once | ORAL | Status: AC
  Administered 2018-11-16: 50 mg via ORAL
  Filled 2018-11-16: qty 1

## 2018-11-16 MED ORDER — ONDANSETRON HCL 4 MG/2ML IJ SOLN
INTRAMUSCULAR | Status: AC
  Administered 2018-11-16: 4 mg via INTRAVENOUS
  Filled 2018-11-16: qty 2

## 2018-11-16 MED ORDER — DEXAMETHASONE SODIUM PHOSPHATE 10 MG/ML IJ SOLN
6.0000 mg | Freq: Once | INTRAMUSCULAR | Status: AC
  Administered 2018-11-16: 6 mg via INTRAVENOUS
  Filled 2018-11-16: qty 1

## 2018-11-16 MED ORDER — HYDRALAZINE HCL 50 MG PO TABS
50.0000 mg | ORAL_TABLET | Freq: Once | ORAL | Status: AC
  Administered 2018-11-16: 50 mg via ORAL
  Filled 2018-11-16: qty 1

## 2018-11-16 MED ADMIN — Heparin Sod (Porcine) in NaCl IV Soln 25000 Unit/250ML-0.45%: INTRAVENOUS | @ 19:00:00

## 2018-11-16 NOTE — ED Notes (Signed)
[facility] called to verify pt care received and equipment necessary for transport - he will be placing bed request and will let us know when that has been approved/assigned

## 2018-11-16 NOTE — ED Notes (Signed)
Pt alert laying in bed, in no distress. Equal unlabored breaths noted. Pt sipping on drink.

## 2018-11-16 NOTE — ED Notes (Signed)
Pt placed on 2L O2 for sat 87% on RA.

## 2018-11-16 NOTE — Consult Note (Addendum)
Cardiology Consultation:   Patient ID: Craig Reese MRN: [id number]; DOB: 1960/05/28  Admit date: 11/16/2018 Date of Consult: 11/16/2018  Primary Care Provider: [facility] Primary Electrophysiologist:  None    Patient Profile:   Craig Reese is a 58 y.o. 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 Roxan Hockeyobinson.  History of Present Illness:   Craig Reese presented to the emergency department today via EMS.  History is obtained with the assistance of a Spanish interpreter.  He reports developing abdominal pain and nausea/vomiting last night.  This morning, he also experienced left-sided chest pain that he says is similar to prior heart attacks.  He is unable to characterize the pain further.  He denies shortness of breath and palpitations.  He has been out of most of his medications for approximately 2 weeks and believes that may be contributing to his symptoms.  He has continued to take rivaroxaban, last dose 2 days ago.  He received aspirin and sublingual nitroglycerin by EMS with modest improvement in his chest pain.  Craig Reese denies sick contacts.  He has had occasional chills with his nausea/vomiting that began yesterday.  He denies cough.  Heart Pathway Score:     Past Medical History:  Diagnosis Date  . Coronary artery disease     Past Surgical History:  Procedure Laterality Date  . CORONARY ARTERY BYPASS GRAFT    . LEFT HEART CATH AND CORS/GRAFTS ANGIOGRAPHY N/A 05/17/2018   Procedure: LEFT HEART CATH AND CORS/GRAFTS ANGIOGRAPHY and possible PCI and stent;  Surgeon: Alwyn Peaallwood, Dwayne D, MD;  Location: [facility]  INVASIVE CV LAB;  Service: Cardiovascular;  Laterality: N/A;     Home Medications:  Prior to Admission medications   Medication Sig Start Date Ruthy Forry Date Taking? Authorizing Provider  acetaminophen (TYLENOL) 500 MG tablet Take 500 mg by mouth every 6 (six) hours as needed.    [provider]  amiodarone (PACERONE) 200 MG tablet Take 1 tablet (200 mg total) by mouth 2 (two) times daily. 06/08/18   Enid Baasjie, Jude, MD  aspirin 81 MG chewable tablet Chew 81 mg by mouth daily.    [provider]  atorvastatin (LIPITOR) 40 MG tablet Take 1 tablet (40 mg total) by mouth daily at 6 PM. 06/08/18   Enid Baasjie, Jude, MD  gabapentin (NEURONTIN) 300 MG capsule Take 1 capsule by mouth 2 (two) times daily. 10/07/17 12/23/18  [provider]  glimepiride (AMARYL) 2 MG tablet Take 1 tablet (2 mg total) by mouth daily with breakfast. 05/18/18   Shaune Pollackhen, Qing, MD  hydrALAZINE (APRESOLINE) 25 MG tablet Take 1 tablet by mouth 3 (three) times daily. 12/23/17   [provider]  levothyroxine (SYNTHROID) 150 MCG tablet Take 1 tablet by mouth daily. 06/16/17 12/23/18  [provider]  metFORMIN (GLUCOPHAGE) 500 MG tablet Take 2 tablets (1,000 mg total) by mouth 2 (two) times daily. 05/17/18 11/23/19  Shaune Pollackhen, Qing, MD  metoprolol tartrate (LOPRESSOR) 50 MG tablet Take 1 tablet (50 mg total) by mouth 2 (two) times daily. 06/08/18   Enid Baasjie, Jude, MD  rivaroxaban (XARELTO) 20 MG TABS tablet Take 1 tablet by mouth daily. 05/09/15 07/05/18  [provider]    Inpatient Medications: Scheduled Meds:  Continuous Infusions:  PRN Meds:   Allergies:   No Known Allergies  Social History:   Social History   Tobacco Use  . Smoking status: Never Smoker  . Smokeless tobacco: Never Used  Substance Use Topics  . Alcohol use: Not Currently  . Drug use: Not on file     Family History:   Brother with history of cancer.  ROS:  Please see the history of present illness. All other ROS reviewed and  negative.     Physical Exam/Data:   Vitals:   11/16/18 1546 11/16/18 1559 11/16/18 1600  BP:  (!) 181/101   Pulse: 80    Resp: 10    SpO2: 98%    Weight:   124.7 kg  Height:   5\' 10"  (1.778 m)   No intake or output data in the 24 hours ending 11/16/18 1607 Last 3 Weights 11/16/2018 06/08/2018 06/07/2018  Weight (lbs) 275 lb 275 lb 277 lb 8 oz  Weight (kg) 124.739 kg 124.739 kg 125.873 kg     Body mass index is 39.46 kg/m.  General:  Well nourished, well developed, in no acute distress HEENT: normal Lymph: no adenopathy Neck: no JVD Endocrine:  No thryomegaly Vascular: No carotid bruits; 2+ radial pulses. Cardiac: Regular rate and rhythm with 1/6 systolic murmur.  Normal S1 and S2.  No rubs or gallops. Lungs:  clear to auscultation bilaterally, no wheezing, rhonchi or rales  Abd: soft, nontender, no hepatomegaly  Ext: no edema Musculoskeletal:  No deformities, BUE and BLE strength normal and equal Skin: warm and dry  Neuro:  CNs 2-12 intact, no focal abnormalities noted Psych:  Normal affect   EKG:  The EKG was personally reviewed and demonstrates: Normal sinus rhythm with right bundle branch block and left anterior fascicular block.  There is less than 1 mm of ST elevation in lead III.  No significant change from prior tracing on 06/07/2018.  Relevant CV Studies: LHC (05/17/2018): LMCA normal.  LAD with diffuse 50% proximal disease.  LCx with 90% proximal stenosis.  RCA normal.  SVG to OM patent with 25% proximal stenosis.  Laboratory Data:  High Sensitivity Troponin:  No results for input(s): TROPONINIHS in the last 720 hours.   Cardiac EnzymesNo results for input(s): TROPONINI in the last 168 hours. No results for input(s): TROPIPOC in the last 168 hours.  ChemistryNo results for input(s): NA, K, CL, CO2, GLUCOSE, BUN, CREATININE, CALCIUM, GFRNONAA, GFRAA, ANIONGAP in the last 168 hours.  No results for input(s): PROT, ALBUMIN, AST, ALT, ALKPHOS, BILITOT in the last 168  hours. HematologyNo results for input(s): WBC, RBC, HGB, HCT, MCV, MCH, MCHC, RDW, PLT in the last 168 hours. BNPNo results for input(s): BNP, PROBNP in the last 168 hours.  DDimer No results for input(s): DDIMER in the last 168 hours.   Radiology/Studies:  No results found.  Assessment and Plan:   Hypertensive emergency: Patient noted to be quite hypertensive on arrival in the setting of not having taken any medications (other than rivaroxaban) for approximately 2 weeks.  Though ACS cannot be excluded, his EKG today is unchanged since January.  Additionally, catheterization in late 04/2018 showed moderate LAD disease and patent SVG supplying LCx territory beyond 90% proximal LCx stenosis.  I recommend blood pressure treatment.  IV or transdermal nitroglycerin would be a reasonable first step.  I also recommend restarting metoprolol and hydralazine.  Gentle diuresis could also be considered, though the patient does not appear significantly volume overloaded on exam.  Coronary artery disease: As above, presentation is more consistent with hypertensive emergency and supply-demand mismatch.  I think it is reasonable to trend troponins.  Heparin infusion should be initiated both for ongoing chest pain as well as history of atrial fibrillation with last dose of rivaroxaban greater than 24 hours ago.  EKG does not meet STEMI criteria and is unchanged from prior tracings.  No indication for emergent cardiac catheterization at this time.  History of aortic valve endocarditis status post bioprosthetic aortic valve replacement: No murmur on exam today.  Consider transthoracic echocardiogram.  Recommend continuation of aspirin.  Paroxysmal atrial fibrillation: EKG today demonstrates sinus rhythm.  Patient was previously on metoprolol, amiodarone, and rivaroxaban.  I recommend restarting metoprolol as well as amiodarone 200 mg daily.  I favor using heparin in place of rivaroxaban for now, in case invasive  procedures are needed during this hospitalization.  For questions or updates, please contact [place] Please consult [email or web address] for contact info under [facility].  Signed, Nelva Bush, MD  11/16/2018 4:07 PM

## 2018-11-16 NOTE — Consult Note (Signed)
ANTICOAGULATION CONSULT NOTE - Initial Consult  Pharmacy Consult for Heparin Drip Indication: chest pain/ACS  No Known Allergies  Patient Measurements: Height: 5\' 10"  (177.8 cm) Weight: 275 lb (124.7 kg) IBW/kg (Calculated) : 73 Heparin Dosing Weight: 101.3 kg  Vital Signs: Temp: 98.2 F (36.8 C) (06/30 1611) Temp Source: Oral (06/30 1611) BP: 140/74 (06/30 1730) Pulse Rate: 63 (06/30 1730)  Labs: Recent Labs    11/16/18 1549  HGB 12.0*  HCT 36.5*  PLT 164  LABPROT 13.3  INR 1.0  CREATININE 1.22  TROPONINIHS 25*    Estimated Creatinine Clearance: 89.6 mL/min (by C-G formula based on SCr of 1.22 mg/dL).   Medical History: Past Medical History:  Diagnosis Date  . Coronary artery disease     Medications:  (Not in a hospital admission)  Scheduled:  . dexamethasone (DECADRON) injection  6 mg Intravenous Once  . heparin  4,000 Units Intravenous Once   Infusions:  . heparin     PRN:  Anti-infectives (From admission, onward)   None      Assessment: Pharmacy consulted to initiate Heparin drip on 58 yo patient. Patient on Rivaroxaban 20mg  PTA but hasn't taken dose within the past 3 days, according to patient. Therefore, will initiate therapy immediately. Baseline labs have been ordered.  Patient has tested positive for COVID-19. Will be transferred to [facility] where therapy will continue.    Goal of Therapy:  Heparin level 0.3-0.7 units/ml Monitor platelets by anticoagulation protocol: Yes   Plan:  Give 4000 units bolus x 1 Start heparin infusion at 1300 units/hr Check anti-Xa level in 6 hours and daily while on heparin Continue to monitor H&H and platelets  Jazsmine Macari A Joyous Gleghorn 11/16/2018,5:58 PM

## 2018-11-16 NOTE — ED Notes (Signed)
Pt given meal tray and water at this time 

## 2018-11-16 NOTE — ED Triage Notes (Signed)
Pt presents to ED via [facility] c/o CP starting last night, +emesis en route to ED, pt appears diaphoretic. States he has not taken any of his medications x2 wks because he requested refills but they have not been delivered yet. Hx previous MI.

## 2018-11-16 NOTE — ED Provider Notes (Signed)
[facility] Emergency Department Provider Note    First MD Initiated Contact with Patient 11/16/18 1547     (approximate)  I have reviewed the triage vital signs and the nursing notes.   HISTORY  Chief Complaint Code STEMI    HPI Craig Reese is a 58 y.o. male with a history of known coronary artery disease status post CABG presents the ER with chief complaint of epigastric abdominal pain that was severe in nature starting last night and then woke up this morning with burning chest pain and pressure.  States he does still have some chest discomfort.  EMS found the patient diaphoretic with chest pain and they called code STEMI based on EKG in route.  Repeat EKG upon arrival and review of EKG via EMS does not show any evidence of ST elevation MI.  Appears consistent with previous.    Past Medical History:  Diagnosis Date  . Coronary artery disease    History reviewed. No pertinent family history. Past Surgical History:  Procedure Laterality Date  . CORONARY ARTERY BYPASS GRAFT    . LEFT HEART CATH AND CORS/GRAFTS ANGIOGRAPHY N/A 05/17/2018   Procedure: LEFT HEART CATH AND CORS/GRAFTS ANGIOGRAPHY and possible PCI and stent;  Surgeon: Alwyn Peaallwood, Dwayne D, MD;  Location: [facility] INVASIVE CV LAB;  Service: Cardiovascular;  Laterality: N/A;   Patient Active Problem List   Diagnosis Date Noted  . Atrial fibrillation with RVR (HCC) 06/06/2018  . Chest pain due to CAD (HCC) 05/13/2018      Prior to Admission medications   Medication Sig Start Date End Date Taking? Authorizing Provider  acetaminophen (TYLENOL) 500 MG tablet Take 500 mg by mouth every 6 (six) hours as needed.   Yes [provider]  amiodarone (PACERONE) 200 MG tablet Take 1 tablet (200 mg total) by mouth 2 (two) times daily. 06/08/18  Yes Ojie, Jude, MD  atorvastatin (LIPITOR) 40 MG tablet Take 1 tablet (40 mg total) by mouth daily at 6 PM. 06/08/18  Yes Ojie, Jude, MD  furosemide (LASIX)  40 MG tablet Take 40 mg by mouth daily.   Yes [provider]  gabapentin (NEURONTIN) 300 MG capsule Take 1 capsule by mouth 2 (two) times daily. 10/07/17 12/23/18 Yes [provider]  glimepiride (AMARYL) 2 MG tablet Take 1 tablet (2 mg total) by mouth daily with breakfast. 05/18/18  Yes Shaune Pollackhen, Qing, MD  hydrALAZINE (APRESOLINE) 25 MG tablet Take 1 tablet by mouth 3 (three) times daily. 12/23/17  Yes [provider]  levothyroxine (SYNTHROID) 150 MCG tablet Take 1 tablet by mouth daily. 06/16/17 12/23/18 Yes [provider]  metFORMIN (GLUCOPHAGE) 500 MG tablet Take 2 tablets (1,000 mg total) by mouth 2 (two) times daily. Patient taking differently: Take 500 mg by mouth 2 (two) times daily.  05/17/18 11/23/19 Yes Shaune Pollackhen, Qing, MD  metoprolol tartrate (LOPRESSOR) 50 MG tablet Take 1 tablet (50 mg total) by mouth 2 (two) times daily. 06/08/18  Yes Ojie, Jude, MD  omeprazole (PRILOSEC) 20 MG capsule Take 40 mg by mouth daily.   Yes [provider]  rivaroxaban (XARELTO) 20 MG TABS tablet Take 1 tablet by mouth daily. 05/09/15 11/16/18 Yes [provider]    Allergies Patient has no known allergies.    Social History Social History   Tobacco Use  . Smoking status: Never Smoker  . Smokeless tobacco: Never Used  Substance Use Topics  . Alcohol use: Not Currently  . Drug use: Not on file  Review of Systems Patient denies headaches, rhinorrhea, blurry vision, numbness, shortness of breath, chest pain, edema, cough, abdominal pain, nausea, vomiting, diarrhea, dysuria, fevers, rashes or hallucinations unless otherwise stated above in HPI. ____________________________________________   PHYSICAL EXAM:  VITAL SIGNS: Vitals:   11/16/18 1700 11/16/18 1730  BP: 134/73 140/74  Pulse: 66 63  Resp: 17 17  Temp:    SpO2: 96% 98%    Constitutional: Alert and oriented.  Eyes: Conjunctivae are normal.  Head: Atraumatic. Nose: No  congestion/rhinnorhea. Mouth/Throat: Mucous membranes are moist.   Neck: No stridor. Painless ROM.  Cardiovascular: Normal rate, regular rhythm. Grossly normal heart sounds.  Good peripheral circulation. Respiratory: Normal respiratory effort.  No retractions. Lungs CTAB. Gastrointestinal: Soft and nontender. No distention. No abdominal bruits. No CVA tenderness. Genitourinary:  Musculoskeletal: No lower extremity tenderness nor edema.  No joint effusions. Neurologic:  Normal speech and language. No gross focal neurologic deficits are appreciated. No facial droop Skin:  Skin is warm, dry and intact. No rash noted. Psychiatric: Mood and affect are normal. Speech and behavior are normal.  ____________________________________________   LABS (all labs ordered are listed, but only abnormal results are displayed)  Results for orders placed or performed during the hospital encounter of 11/16/18 (from the past 24 hour(s))  SARS Coronavirus 2 (CEPHEID- Performed in [facility] hospital lab), Hosp Order     Status: Abnormal   Collection Time: 11/16/18  3:49 PM   Specimen: Nasopharyngeal Swab  Result Value Ref Range   SARS Coronavirus 2 POSITIVE (A) NEGATIVE  CBC with Differential/Platelet     Status: Abnormal   Collection Time: 11/16/18  3:49 PM  Result Value Ref Range   WBC 8.3 4.0 - 10.5 K/uL   RBC 4.15 (L) 4.22 - 5.81 MIL/uL   Hemoglobin 12.0 (L) 13.0 - 17.0 g/dL   HCT 16.136.5 (L) 09.639.0 - 04.552.0 %   MCV 88.0 80.0 - 100.0 fL   MCH 28.9 26.0 - 34.0 pg   MCHC 32.9 30.0 - 36.0 g/dL   RDW 40.914.2 81.111.5 - 91.415.5 %   Platelets 164 150 - 400 K/uL   nRBC 0.0 0.0 - 0.2 %   Neutrophils Relative % 79 %   Neutro Abs 6.5 1.7 - 7.7 K/uL   Lymphocytes Relative 16 %   Lymphs Abs 1.3 0.7 - 4.0 K/uL   Monocytes Relative 4 %   Monocytes Absolute 0.4 0.1 - 1.0 K/uL   Eosinophils Relative 1 %   Eosinophils Absolute 0.1 0.0 - 0.5 K/uL   Basophils Relative 0 %   Basophils Absolute 0.0 0.0 - 0.1 K/uL   Immature  Granulocytes 0 %   Abs Immature Granulocytes 0.02 0.00 - 0.07 K/uL  Comprehensive metabolic panel     Status: Abnormal   Collection Time: 11/16/18  3:49 PM  Result Value Ref Range   Sodium 140 135 - 145 mmol/L   Potassium 3.2 (L) 3.5 - 5.1 mmol/L   Chloride 103 98 - 111 mmol/L   CO2 25 22 - 32 mmol/L   Glucose, Bld 134 (H) 70 - 99 mg/dL   BUN 22 (H) 6 - 20 mg/dL   Creatinine, Ser 7.821.22 0.61 - 1.24 mg/dL   Calcium 8.2 (L) 8.9 - 10.3 mg/dL   Total Protein 8.0 6.5 - 8.1 g/dL   Albumin 4.2 3.5 - 5.0 g/dL   AST 54 (H) 15 - 41 U/L   ALT 61 (H) 0 - 44 U/L   Alkaline Phosphatase 80 38 - 126 U/L  Total Bilirubin 0.5 0.3 - 1.2 mg/dL   GFR calc non Af Amer >60 >60 mL/min   GFR calc Af Amer >60 >60 mL/min   Anion gap 12 5 - 15  Troponin I (High Sensitivity)     Status: Abnormal   Collection Time: 11/16/18  3:49 PM  Result Value Ref Range   Troponin I (High Sensitivity) 25 (H) <18 ng/L  Lipase, blood     Status: None   Collection Time: 11/16/18  3:49 PM  Result Value Ref Range   Lipase 30 11 - 51 U/L  Protime-INR     Status: None   Collection Time: 11/16/18  3:49 PM  Result Value Ref Range   Prothrombin Time 13.3 11.4 - 15.2 seconds   INR 1.0 0.8 - 1.2  Brain natriuretic peptide     Status: None   Collection Time: 11/16/18  3:52 PM  Result Value Ref Range   B Natriuretic Peptide 56.0 0.0 - 100.0 pg/mL   ____________________________________________  EKG My review and personal interpretation at Time: 15:46   Indication: chest pain  Rate: 80  Rhythm: sinus Axis: normal Other: rbbb, no stemi, ekg unchanged from previous tracing ____________________________________________  RADIOLOGY  I personally reviewed all radiographic images ordered to evaluate for the above acute complaints and reviewed radiology reports and findings.  These findings were personally discussed with the patient.  Please see medical record for radiology report.  ____________________________________________    PROCEDURES  Procedure(s) performed:  .Critical Care Performed by: Willy Eddyobinson, Malaiyah Achorn, MD Authorized by: Willy Eddyobinson, Rosalin Buster, MD   Critical care provider statement:    Critical care time (minutes):  30   Critical care time was exclusive of:  Separately billable procedures and treating other patients   Critical care was necessary to treat or prevent imminent or life-threatening deterioration of the following conditions:  Respiratory failure   Critical care was time spent personally by me on the following activities:  Development of treatment plan with patient or surrogate, discussions with consultants, evaluation of patient's response to treatment, examination of patient, obtaining history from patient or surrogate, ordering and performing treatments and interventions, ordering and review of laboratory studies, ordering and review of radiographic studies, pulse oximetry, re-evaluation of patient's condition and review of old charts      Critical Care performed: yes ____________________________________________   INITIAL IMPRESSION / ASSESSMENT AND PLAN / ED COURSE  Pertinent labs & imaging results that were available during my care of the patient were reviewed by me and considered in my medical decision making (see chart for details).   DDX: ACS, pericarditis, esophagitis, boerhaaves, pe, dissection, pna, bronchitis, costochondritis   Craig PernaJose Vasquez is a 58 y.o. who presents to the ED with symptoms as described above.  Does not meet criteria for STEMI.  Patient was evaluated by Dr. end of cardiology at bedside in anticipation that it would be a STEMI however certainly more concerning for viral illness, CHF or hypertensive urgency.  The patient will be placed on continuous pulse oximetry and telemetry for monitoring.  Laboratory evaluation will be sent to evaluate for the above complaints.     Clinical Course as of Nov 16 1806  Tue Nov 16, 2018  1650 Blood pressure improved.  Chest x-ray no  pneumothorax.   [PR]  1728 Patient is hypoxic down to 87% requiring supplemental oxygen..  Troponin is equivocal.  Blood pressure improving after nitro pain medication as well.  No sepsis criteria will give Decadron.  Will discuss with hospitalist at  [employer] for for [employer].   [PR]    Clinical Course User Index [PR] Merlyn Lot, MD    The patient was evaluated in Emergency Department today for the symptoms described in the history of present illness. He/she was evaluated in the context of the global COVID-19 pandemic, which necessitated consideration that the patient might be at risk for infection with the SARS-CoV-2 virus that causes COVID-19. Institutional protocols and algorithms that pertain to the evaluation of patients at risk for COVID-19 are in a state of rapid change based on information released by regulatory bodies including the CDC and federal and state organizations. These policies and algorithms were followed during the patient's care in the ED.   As part of my medical decision making, I reviewed the following data within the [facility] notes reviewed and incorporated, Labs reviewed, notes from prior ED visits and  Controlled Substance Database   ____________________________________________   FINAL CLINICAL IMPRESSION(S) / ED DIAGNOSES  Final diagnoses:  Acute respiratory failure with hypoxia ([place])  COVID-19 virus detected      NEW MEDICATIONS STARTED DURING THIS VISIT:  New Prescriptions   No medications on file     Note:  This document was prepared using Dragon voice recognition software and may include unintentional dictation errors.    Merlyn Lot, MD 11/16/18 [phone]

## 2018-11-17 ENCOUNTER — Inpatient Hospital Stay (HOSPITAL_COMMUNITY)
Admission: AD | Admit: 2018-11-17 | Discharge: 2018-11-21 | DRG: 311 | Disposition: A | Discharge status: Home / Self Care | Payer: HRSA Program | Source: Other Acute Inpatient Hospital | Attending: Internal Medicine | Admitting: Internal Medicine

## 2018-11-17 ENCOUNTER — Encounter (HOSPITAL_COMMUNITY): Payer: Self-pay | Admitting: Family Medicine

## 2018-11-17 DIAGNOSIS — E890 Postprocedural hypothyroidism: Secondary | ICD-10-CM

## 2018-11-17 DIAGNOSIS — Z79890 Hormone replacement therapy (postmenopausal): Secondary | ICD-10-CM

## 2018-11-17 DIAGNOSIS — E1169 Type 2 diabetes mellitus with other specified complication: Secondary | ICD-10-CM

## 2018-11-17 DIAGNOSIS — I4891 Unspecified atrial fibrillation: Secondary | ICD-10-CM | POA: Diagnosis present

## 2018-11-17 DIAGNOSIS — R079 Chest pain, unspecified: Secondary | ICD-10-CM | POA: Diagnosis present

## 2018-11-17 DIAGNOSIS — I480 Paroxysmal atrial fibrillation: Secondary | ICD-10-CM | POA: Diagnosis not present

## 2018-11-17 DIAGNOSIS — R740 Nonspecific elevation of levels of transaminase and lactic acid dehydrogenase [LDH]: Secondary | ICD-10-CM | POA: Diagnosis not present

## 2018-11-17 DIAGNOSIS — U071 COVID-19: Secondary | ICD-10-CM | POA: Diagnosis not present

## 2018-11-17 DIAGNOSIS — E785 Hyperlipidemia, unspecified: Secondary | ICD-10-CM | POA: Diagnosis present

## 2018-11-17 DIAGNOSIS — Z7901 Long term (current) use of anticoagulants: Secondary | ICD-10-CM

## 2018-11-17 DIAGNOSIS — I2510 Atherosclerotic heart disease of native coronary artery without angina pectoris: Secondary | ICD-10-CM | POA: Diagnosis present

## 2018-11-17 DIAGNOSIS — I452 Bifascicular block: Secondary | ICD-10-CM | POA: Diagnosis present

## 2018-11-17 DIAGNOSIS — I248 Other forms of acute ischemic heart disease: Principal | ICD-10-CM | POA: Diagnosis present

## 2018-11-17 DIAGNOSIS — I160 Hypertensive urgency: Secondary | ICD-10-CM | POA: Diagnosis present

## 2018-11-17 DIAGNOSIS — I25119 Atherosclerotic heart disease of native coronary artery with unspecified angina pectoris: Secondary | ICD-10-CM

## 2018-11-17 DIAGNOSIS — Z953 Presence of xenogenic heart valve: Secondary | ICD-10-CM

## 2018-11-17 DIAGNOSIS — R9431 Abnormal electrocardiogram [ECG] [EKG]: Secondary | ICD-10-CM | POA: Diagnosis present

## 2018-11-17 DIAGNOSIS — J9601 Acute respiratory failure with hypoxia: Secondary | ICD-10-CM | POA: Diagnosis present

## 2018-11-17 DIAGNOSIS — E039 Hypothyroidism, unspecified: Secondary | ICD-10-CM | POA: Diagnosis present

## 2018-11-17 DIAGNOSIS — I252 Old myocardial infarction: Secondary | ICD-10-CM

## 2018-11-17 DIAGNOSIS — Z951 Presence of aortocoronary bypass graft: Secondary | ICD-10-CM

## 2018-11-17 DIAGNOSIS — E876 Hypokalemia: Secondary | ICD-10-CM | POA: Diagnosis present

## 2018-11-17 DIAGNOSIS — I5032 Chronic diastolic (congestive) heart failure: Secondary | ICD-10-CM | POA: Diagnosis present

## 2018-11-17 DIAGNOSIS — E119 Type 2 diabetes mellitus without complications: Secondary | ICD-10-CM

## 2018-11-17 DIAGNOSIS — I110 Hypertensive heart disease with heart failure: Secondary | ICD-10-CM | POA: Diagnosis present

## 2018-11-17 DIAGNOSIS — Z79899 Other long term (current) drug therapy: Secondary | ICD-10-CM

## 2018-11-17 DIAGNOSIS — E1165 Type 2 diabetes mellitus with hyperglycemia: Secondary | ICD-10-CM | POA: Diagnosis present

## 2018-11-17 DIAGNOSIS — Z7984 Long term (current) use of oral hypoglycemic drugs: Secondary | ICD-10-CM

## 2018-11-17 DIAGNOSIS — I161 Hypertensive emergency: Secondary | ICD-10-CM | POA: Diagnosis present

## 2018-11-17 HISTORY — DX: Chronic diastolic (congestive) heart failure: I50.32

## 2018-11-17 HISTORY — DX: Type 2 diabetes mellitus without complications: E11.9

## 2018-11-17 HISTORY — DX: Hypothyroidism, unspecified: E03.9

## 2018-11-17 LAB — CBC
HCT: 34.9 % — ABNORMAL LOW (ref 39.0–52.0)
Hemoglobin: 11.3 g/dL — ABNORMAL LOW (ref 13.0–17.0)
MCH: 28.6 pg (ref 26.0–34.0)
MCHC: 32.4 g/dL (ref 30.0–36.0)
MCV: 88.4 fL (ref 80.0–100.0)
Platelets: 176 10*3/uL (ref 150–400)
RBC: 3.95 MIL/uL — ABNORMAL LOW (ref 4.22–5.81)
RDW: 13.8 % (ref 11.5–15.5)
WBC: 6.6 10*3/uL (ref 4.0–10.5)
nRBC: 0 % (ref 0.0–0.2)

## 2018-11-17 LAB — BASIC METABOLIC PANEL
Anion gap: 13 (ref 5–15)
BUN: 18 mg/dL (ref 6–20)
CO2: 26 mmol/L (ref 22–32)
Calcium: 7.9 mg/dL — ABNORMAL LOW (ref 8.9–10.3)
Chloride: 101 mmol/L (ref 98–111)
Creatinine, Ser: 1.26 mg/dL — ABNORMAL HIGH (ref 0.61–1.24)
GFR calc Af Amer: >60 mL/min (ref >60)
GFR calc non Af Amer: >60 mL/min (ref >60)
Glucose, Bld: 178 mg/dL — ABNORMAL HIGH (ref 70–99)
Potassium: 3.5 mmol/L (ref 3.5–5.1)
Sodium: 140 mmol/L (ref 135–145)

## 2018-11-17 LAB — APTT: aPTT: 68 seconds — ABNORMAL HIGH (ref 24–36)

## 2018-11-17 LAB — PROCALCITONIN: Procalcitonin: <0.1 ng/mL

## 2018-11-17 LAB — HEMOGLOBIN A1C
Hgb A1c MFr Bld: 6.9 % — ABNORMAL HIGH (ref 4.8–5.6)
Mean Plasma Glucose: 151.33 mg/dL

## 2018-11-17 LAB — HEPARIN LEVEL (UNFRACTIONATED)
Heparin Unfractionated: 0.52 IU/mL (ref 0.30–0.70)
Heparin Unfractionated: 0.4 IU/mL (ref 0.30–0.70)

## 2018-11-17 LAB — ABO/RH: ABO/RH(D): O POS

## 2018-11-17 LAB — C-REACTIVE PROTEIN: CRP: <0.8 mg/dL (ref <1.0)

## 2018-11-17 LAB — GLUCOSE, CAPILLARY: Glucose-Capillary: 210 mg/dL — ABNORMAL HIGH (ref 70–99)

## 2018-11-17 LAB — TROPONIN I (HIGH SENSITIVITY): Troponin I (High Sensitivity): 28 ng/L — ABNORMAL HIGH (ref <18)

## 2018-11-17 LAB — MAGNESIUM: Magnesium: 1.8 mg/dL (ref 1.7–2.4)

## 2018-11-17 MED ORDER — SODIUM CHLORIDE 0.9 % IV SOLN: 250.0000 mL | INTRAVENOUS | Status: DC | PRN

## 2018-11-17 MED ORDER — METOPROLOL TARTRATE 25 MG PO TABS
50.0000 mg | ORAL_TABLET | Freq: Two times a day (BID) | ORAL | Status: DC
  Administered 2018-11-17 – 2018-11-20 (×7): 50 mg via ORAL
  Administered 2018-11-20: 11:00:00 25 mg via ORAL
  Administered 2018-11-21: 10:00:00 50 mg via ORAL
  Filled 2018-11-17: qty 1
  Filled 2018-11-17: qty 2
  Filled 2018-11-17: qty 1
  Filled 2018-11-17 (×5): qty 2
  Filled 2018-11-17: qty 1

## 2018-11-17 MED ORDER — LEVOTHYROXINE SODIUM 75 MCG PO TABS
150.0000 ug | ORAL_TABLET | Freq: Every day | ORAL | Status: DC
  Administered 2018-11-17 – 2018-11-21 (×5): 150 ug via ORAL
  Filled 2018-11-17 (×5): qty 2

## 2018-11-17 MED ORDER — HEPARIN (PORCINE) 25000 UT/250ML-% IV SOLN
1300.0000 [IU]/h | INTRAVENOUS | Status: DC
  Administered 2018-11-17 – 2018-11-18 (×3): 1300 [IU]/h via INTRAVENOUS
  Filled 2018-11-17 (×2): qty 250

## 2018-11-17 MED ORDER — GABAPENTIN 300 MG PO CAPS
300.0000 mg | ORAL_CAPSULE | Freq: Two times a day (BID) | ORAL | Status: DC
  Administered 2018-11-17 – 2018-11-21 (×9): 300 mg via ORAL
  Filled 2018-11-17 (×9): qty 1

## 2018-11-17 MED ORDER — HYDRALAZINE HCL 50 MG PO TABS
25.0000 mg | ORAL_TABLET | Freq: Three times a day (TID) | ORAL | Status: DC
  Administered 2018-11-17 – 2018-11-21 (×13): 25 mg via ORAL
  Filled 2018-11-17 (×13): qty 1

## 2018-11-17 MED ORDER — POTASSIUM CHLORIDE CRYS ER 20 MEQ PO TBCR
40.0000 meq | EXTENDED_RELEASE_TABLET | Freq: Once | ORAL | Status: AC
  Administered 2018-11-17: 40 meq via ORAL
  Filled 2018-11-17: qty 2

## 2018-11-17 MED ORDER — INSULIN ASPART 100 UNIT/ML ~~LOC~~ SOLN
0.0000 [IU] | Freq: Three times a day (TID) | SUBCUTANEOUS | Status: DC
  Administered 2018-11-18: 3 [IU] via SUBCUTANEOUS
  Administered 2018-11-18: 1 [IU] via SUBCUTANEOUS

## 2018-11-17 MED ORDER — FUROSEMIDE 20 MG PO TABS
40.0000 mg | ORAL_TABLET | Freq: Every day | ORAL | Status: DC
  Administered 2018-11-17 – 2018-11-21 (×5): 40 mg via ORAL
  Filled 2018-11-17: qty 2
  Filled 2018-11-17 (×2): qty 1
  Filled 2018-11-17 (×2): qty 2

## 2018-11-17 MED ORDER — ONDANSETRON HCL 4 MG/2ML IJ SOLN: 4.0000 mg | Freq: Four times a day (QID) | INTRAMUSCULAR | Status: DC | PRN

## 2018-11-17 MED ORDER — PANTOPRAZOLE SODIUM 40 MG PO TBEC
40.0000 mg | DELAYED_RELEASE_TABLET | Freq: Every day | ORAL | Status: DC
  Administered 2018-11-17 – 2018-11-21 (×5): 40 mg via ORAL
  Filled 2018-11-17 (×6): qty 1

## 2018-11-17 MED ORDER — ASPIRIN EC 81 MG PO TBEC
81.0000 mg | DELAYED_RELEASE_TABLET | Freq: Every day | ORAL | Status: DC
  Administered 2018-11-18 – 2018-11-21 (×4): 81 mg via ORAL
  Filled 2018-11-17 (×4): qty 1

## 2018-11-17 MED ORDER — SODIUM CHLORIDE 0.9% FLUSH
3.0000 mL | Freq: Two times a day (BID) | INTRAVENOUS | Status: DC
  Administered 2018-11-17 – 2018-11-21 (×6): 3 mL via INTRAVENOUS

## 2018-11-17 MED ORDER — INSULIN ASPART 100 UNIT/ML ~~LOC~~ SOLN
0.0000 [IU] | Freq: Every day | SUBCUTANEOUS | Status: DC
  Administered 2018-11-17: 2 [IU] via SUBCUTANEOUS
  Administered 2018-11-19: 3 [IU] via SUBCUTANEOUS
  Administered 2018-11-20: 5 [IU] via SUBCUTANEOUS

## 2018-11-17 MED ORDER — SODIUM CHLORIDE 0.9% FLUSH
3.0000 mL | INTRAVENOUS | Status: DC | PRN
  Administered 2018-11-19: 3 mL via INTRAVENOUS
  Filled 2018-11-17: qty 3

## 2018-11-17 MED ORDER — NITROGLYCERIN 0.4 MG SL SUBL: 0.4000 mg | SUBLINGUAL_TABLET | SUBLINGUAL | Status: DC | PRN

## 2018-11-17 MED ORDER — ATORVASTATIN CALCIUM 40 MG PO TABS
40.0000 mg | ORAL_TABLET | Freq: Every day | ORAL | Status: DC
  Administered 2018-11-17 – 2018-11-21 (×5): 40 mg via ORAL
  Filled 2018-11-17 (×4): qty 1

## 2018-11-17 MED ORDER — ACETAMINOPHEN 325 MG PO TABS
650.0000 mg | ORAL_TABLET | ORAL | Status: DC | PRN
  Administered 2018-11-17 – 2018-11-19 (×5): 650 mg via ORAL
  Filled 2018-11-17 (×5): qty 2

## 2018-11-17 NOTE — Progress Notes (Signed)
PROGRESS NOTE    Craig Reese  NWG:[id number] DOB: 03-24-1961 DOA: 11/17/2018 [facility]: [facility]    Brief Narrative:  Craig PernaJose Reese is a 58 y.o. male with medical history significant for coronary artery disease status post CABG, type 2 diabetes mellitus, chronic diastolic CHF, atrial fibrillation on Xarelto, hypothyroidism, and hyperlipidemia, now presenting to the emergency department for evaluation of chest pain.  Patient ran out of all of his medications except for Xarelto 2 weeks ago and has only taken the Xarelto since that time.  He continued to be in his usual state until the night of 11/15/2018 when he developed epigastric pain with nausea and nonbloody vomiting.  Following morning, he woke with chest pain, localized to the central and left side of his chest, burning in character, similar to his prior heart attacks.  EMS was called, patient was noted to be diaphoretic, there was concern for possible STEMI on EKG, he was treated with aspirin and nitroglycerin, had improvement in his pain, and he was brought into the ED.  Upon arrival to the [facility] ED, patient is found to be afebrile, saturating 87% on room air, slightly tachypneic, and hypertensive to 184/96.  EKG features sinus rhythm with RBBB, LAFB, and QTc interval 547 ms.  Chest x-ray is negative for acute cardiopulmonary disease.  Chemistry panel notable for potassium of 3.2 and slight elevation in transaminases.  CBC with a slight normocytic anemia.  Troponin elevated to 25, then elevated further to 30.  BNP is normal.  Cardiology was consulted by the ED physician and recommended medical admission, IV heparin, BP control, and trending of troponins. Cardiology did not recommend any further work up with cardiac cath.    Assessment & Plan:   Principal Problem:   Chest pain Active Problems:   CAD (coronary artery disease)   AF (paroxysmal atrial fibrillation) (HCC)   Diabetes mellitus type II, non insulin dependent  (HCC)   Hypothyroidism   Hypokalemia   Acute respiratory failure with hypoxia (HCC)   COVID-19 virus infection   Hypertensive urgency   Chronic diastolic CHF (congestive heart failure) (HCC)   Prolonged QT interval   Chest pain and diaphoresis ?  NSTEMI Elevated high-sensitivity troponins. Patient denies any chest pain at this time. Cardiology consulted and recommendations given. Medical management at this time with IV heparin, resume blood pressure medications. Resume aspirin 81 mg, Lipitor 40 mg daily.   Paroxysmal atrial fibrillation Rate controlled continue with IV heparin for anticoagulation.   Recent history of COVID 19 viral infection Patient is on room air with good oxygen saturations. No wheezing heard on exam.    Chronic diastolic heart failure Resume Lasix.  Hypertension urgency Resolved   Hypokalemia Resolved   Hypothyroidism Resume Synthroid  Type II diabetes mellitus with hyperglycemia Resume sliding scale insulin, get hemoglobin A1c.   DVT prophylaxis: IV heparin Code Status: Full code Family Communication: None at bedside Disposition Plan: Pending clinical improvement  Consultants:   cardiology   Procedures: None  Antimicrobials: None  Subjective: No chest pain or sob.   Objective: Vitals:   11/17/18 0330 11/17/18 0600 11/17/18 0854  BP: (!) 184/96  139/75  Pulse: 69  64  Temp: 98.4 F (36.9 C)  98 F (36.7 C)  TempSrc: Oral  Oral  SpO2: 97%  92%  Weight:  127.9 kg     Intake/Output Summary (Last 24 hours) at 11/17/2018 1742 Last data filed at 11/17/2018 1500 Gross per 24 hour  Intake 119.17 ml  Output -  Net 119.17 ml   Filed Weights   11/17/18 0600  Weight: 127.9 kg    Examination:  General exam: Appears calm and comfortable , not in distress.  Respiratory system: Clear to auscultation. Respiratory effort normal. Cardiovascular system: S1 & S2 heard, RRR.  Gastrointestinal system: Abdomen is nondistended, soft  and nontender. No organomegaly or masses felt. Normal bowel sounds heard. Central nervous system: Alert and oriented. Non focal.  Extremities: Symmetric 5 x 5 power. Skin: No rashes, lesions or ulcers Psychiatry:. Mood & affect appropriate.     Data Reviewed: I have personally reviewed following labs and imaging studies  CBC: Recent Labs  Lab 11/16/18 1549 11/17/18 0838  WBC 8.3 6.6  NEUTROABS 6.5  --   HGB 12.0* 11.3*  HCT 36.5* 34.9*  MCV 88.0 88.4  PLT 164 161   Basic Metabolic Panel: Recent Labs  Lab 11/16/18 1549 11/17/18 0838  NA 140 140  K 3.2* 3.5  CL 103 101  CO2 25 26  GLUCOSE 134* 178*  BUN 22* 18  CREATININE 1.22 1.26*  CALCIUM 8.2* 7.9*  MG  --  1.8   GFR: Estimated Creatinine Clearance: 88 mL/min (A) (by C-G formula based on SCr of 1.26 mg/dL (H)). Liver Function Tests: Recent Labs  Lab 11/16/18 1549  AST 54*  ALT 61*  ALKPHOS 80  BILITOT 0.5  PROT 8.0  ALBUMIN 4.2   Recent Labs  Lab 11/16/18 1549  LIPASE 30   No results for input(s): AMMONIA in the last 168 hours. Coagulation Profile: Recent Labs  Lab 11/16/18 1549  INR 1.0   Cardiac Enzymes: No results for input(s): CKTOTAL, CKMB, CKMBINDEX, TROPONINI in the last 168 hours. BNP (last 3 results) No results for input(s): PROBNP in the last 8760 hours. HbA1C: No results for input(s): HGBA1C in the last 72 hours. CBG: No results for input(s): GLUCAP in the last 168 hours. Lipid Profile: No results for input(s): CHOL, HDL, LDLCALC, TRIG, CHOLHDL, LDLDIRECT in the last 72 hours. Thyroid Function Tests: No results for input(s): TSH, T4TOTAL, FREET4, T3FREE, THYROIDAB in the last 72 hours. Anemia Panel: No results for input(s): VITAMINB12, FOLATE, FERRITIN, TIBC, IRON, RETICCTPCT in the last 72 hours. Sepsis Labs: Recent Labs  Lab 11/17/18 0838  PROCALCITON <0.10    Recent Results (from the past 240 hour(s))  SARS Coronavirus 2 (CEPHEID- Performed in [facility] hospital  lab), Hosp Order     Status: Abnormal   Collection Time: 11/16/18  3:49 PM   Specimen: Nasopharyngeal Swab  Result Value Ref Range Status   SARS Coronavirus 2 POSITIVE (A) NEGATIVE Final    Comment: RESULT CALLED TO, READ BACK BY AND VERIFIED WITH: Gwynn Burly 11/16/18 1717 KLW (NOTE) If result is NEGATIVE SARS-CoV-2 target nucleic acids are NOT DETECTED. The SARS-CoV-2 RNA is generally detectable in upper and lower  respiratory specimens during the acute phase of infection. The lowest  concentration of SARS-CoV-2 viral copies this assay can detect is 250  copies / mL. A negative result does not preclude SARS-CoV-2 infection  and should not be used as the sole basis for treatment or other  patient management decisions.  A negative result may occur with  improper specimen collection / handling, submission of specimen other  than nasopharyngeal swab, presence of viral mutation(s) within the  areas targeted by this assay, and inadequate number of viral copies  (<250 copies / mL). A negative result must be combined with clinical  observations, patient history, and epidemiological  information. If result is POSITIVE SARS-CoV-2 target nucleic acids are DETECTED. The SARS-C oV-2 RNA is generally detectable in upper and lower  respiratory specimens during the acute phase of infection.  Positive  results are indicative of active infection with SARS-CoV-2.  Clinical  correlation with patient history and other diagnostic information is  necessary to determine patient infection status.  Positive results do  not rule out bacterial infection or co-infection with other viruses. If result is PRESUMPTIVE POSTIVE SARS-CoV-2 nucleic acids MAY BE PRESENT.   A presumptive positive result was obtained on the submitted specimen  and confirmed on repeat testing.  While 2019 novel coronavirus  (SARS-CoV-2) nucleic acids may be present in the submitted sample  additional confirmatory testing may be necessary  for epidemiological  and / or clinical management purposes  to differentiate between  SARS-CoV-2 and other Sarbecovirus currently known to infect humans.  If clinically indicated additional testing with an alternate test  methodology [phone]([id number]) is advised.  The SARS-CoV-2 RNA is generally  detectable in upper and lower respiratory specimens during the acute  phase of infection. The expected result is Negative. Fact Sheet for Patients:  BoilerBrush.com.cyhttps://www.fda.gov/media/136312/download Fact Sheet for Healthcare Providers: https://pope.com/https://www.fda.gov/media/136313/download This test is not yet approved or cleared by the [place] FDA and has been authorized for detection and/or diagnosis of SARS-CoV-2 by FDA under an Emergency Use Authorization (EUA).  This EUA will remain in effect (meaning this test can be used) for the duration of the COVID-19 declaration under Section 564(b)(1) of the Act, 21 U.S.C. section 360bbb-3(b)(1), unless the authorization is terminated or revoked sooner. Performed at [facility], [address] [place], [place] [address]          Radiology Studies: Dg Chest Portable 1 View  Result Date: 11/16/2018 CLINICAL DATA:  Chest pain since last evening. EXAM: PORTABLE CHEST 1 VIEW COMPARISON:  06/06/2010 FINDINGS: The stable surgical changes from cardiac surgery. Heart is upper limits of normal in size and stable. Mild tortuosity and calcification of the thoracic aorta. Stable eventration of the right hemidiaphragm. No infiltrates or effusions. The bony thorax is intact. IMPRESSION: No acute cardiopulmonary findings. Electronically Signed   By: Rudie MeyerP.  Gallerani M.D.   On: 11/16/2018 16:53        Scheduled Meds: . [START ON 11/18/2018] aspirin EC  81 mg Oral Daily  . atorvastatin  40 mg Oral q1800  . furosemide  40 mg Oral Daily  . gabapentin  300 mg Oral BID  . hydrALAZINE  25 mg Oral TID  . levothyroxine  150 mcg Oral Q0600  . metoprolol tartrate  50 mg Oral  BID  . pantoprazole  40 mg Oral Daily  . sodium chloride flush  3 mL Intravenous Q12H   Continuous Infusions: . sodium chloride    . heparin 1,300 Units/hr (11/17/18 1131)     LOS: 0 days    Time spent: 34 minutes.     Kathlen ModyVijaya Journey Castonguay, MD [facility] Pager [phone]   If 7PM-7AM, please contact night-coverage [email or web address] Password [email or web address] 11/17/2018, 5:42 PM

## 2018-11-17 NOTE — ED Notes (Signed)
Pt updated and repositioned, delay explained, pt given warm blanket

## 2018-11-17 NOTE — ED Notes (Signed)
Attempted to call report at this time 

## 2018-11-17 NOTE — Progress Notes (Signed)
[place] for heparin Indication: chest pain/ACS  No Known Allergies  Patient Measurements: Weight: 281 lb 15.5 oz (127.9 kg) Heparin Dosing Weight: 101.3 kg  Vital Signs: Temp: 98 F (36.7 C) (07/01 0854) Temp Source: Oral (07/01 0854) BP: 139/75 (07/01 0854) Pulse Rate: 64 (07/01 0854)  Labs: Recent Labs    11/16/18 1549 11/16/18 1749 11/17/18 0113 11/17/18 0838 11/17/18 1330  HGB 12.0*  --   --  11.3*  --   HCT 36.5*  --   --  34.9*  --   PLT 164  --   --  176  --   APTT  --  28  --   --  68*  LABPROT 13.3  --   --   --   --   INR 1.0  --   --   --   --   HEPARINUNFRC  --   --  0.52  --  0.40  CREATININE 1.22  --   --  1.26*  --   TROPONINIHS 25* 30*  --  28*  --     Estimated Creatinine Clearance: 88 mL/min (A) (by C-G formula based on SCr of 1.26 mg/dL (H)).   Medical History: Past Medical History:  Diagnosis Date  . AF (paroxysmal atrial fibrillation) ([place]) 06/06/2018  . CAD (coronary artery disease) 05/13/2018  . Chronic diastolic CHF (congestive heart failure) ([place]) 11/17/2018  . Coronary artery disease   . Diabetes mellitus type II, non insulin dependent ([place]) 11/17/2018  . Hypothyroidism 11/17/2018    Medications:  See medication history  Assessment: 58 yo man transferred from [facility] to start heparin infusion.  Patient was on xarelto PTA, reportedly last dose 3 days ago.    Heparin level and PTT therapeutic now  Goal of Therapy:  APTT 66-102 sec Heparin level 0.3-0.7 units/ml Monitor platelets by anticoagulation protocol: Yes   Plan:  Continue heparin at 1300 units / hr Follow up AM labs  Thank you Anette Guarneri, PharmD Clinical Pharmacist  11/17/2018,2:35 PM

## 2018-11-17 NOTE — H&P (Signed)
History and Physical    Emric Kowalewski VPX:[id number] DOB: [birth year] DOA: 11/17/2018  PCP: [facility] [place]   Patient coming from: Home   Chief Complaint: Chest pain   HPI: Craig Reese is a 58 y.o. male with medical history significant for coronary artery disease status post CABG, type 2 diabetes mellitus, chronic diastolic CHF, atrial fibrillation on Xarelto, hypothyroidism, and hyperlipidemia, now presenting to the emergency department for evaluation of chest pain.  Patient ran out of all of his medications except for Xarelto 2 weeks ago and has only taken the Xarelto since that time.  He continued to be in his usual state until the night of 11/15/2018 when he developed epigastric pain with nausea and nonbloody vomiting.  Following morning, he woke with chest pain, localized to the central and left side of his chest, burning in character, similar to his prior heart attacks.  EMS was called, patient was noted to be diaphoretic, there was concern for possible STEMI on EKG, he was treated with aspirin and nitroglycerin, had improvement in his pain, and he was brought into the ED.  [facility] ED Course: Upon arrival to the [facility] ED, patient is found to be afebrile, saturating 87% on room air, slightly tachypneic, and hypertensive to 184/96.  EKG features sinus rhythm with RBBB, LAFB, and QTc interval 547 ms.  Chest x-ray is negative for acute cardiopulmonary disease.  Chemistry panel notable for potassium of 3.2 and slight elevation in transaminases.  CBC with a slight normocytic anemia.  Troponin elevated to 25, then elevated further to 30.  BNP is normal.  Cardiology was consulted by the ED physician and recommended medical admission, IV heparin, BP control, and trending of troponins.  Review of Systems:  All other systems reviewed and apart from HPI, are negative.  Past Medical History:  Diagnosis Date   AF (paroxysmal atrial fibrillation) ([place]) 06/06/2018   CAD (coronary artery  disease) 05/13/2018   Chronic diastolic CHF (congestive heart failure) ([place]) 11/17/2018   Coronary artery disease    Diabetes mellitus type II, non insulin dependent ([place]) 11/17/2018   Hypothyroidism 11/17/2018    Past Surgical History:  Procedure Laterality Date   CORONARY ARTERY BYPASS GRAFT     LEFT HEART CATH AND CORS/GRAFTS ANGIOGRAPHY N/A 05/17/2018   Procedure: LEFT HEART CATH AND CORS/GRAFTS ANGIOGRAPHY and possible PCI and stent;  Surgeon: Yolonda Kida, MD;  Location: [facility];  Service: Cardiovascular;  Laterality: N/A;     reports that he has never smoked. He has never used smokeless tobacco. He reports previous alcohol use. No history on file for drug.  No Known Allergies  History reviewed. No pertinent family history.   Prior to Admission medications   Medication Sig Start Date End Date Taking? Authorizing Provider  acetaminophen (TYLENOL) 500 MG tablet Take 500 mg by mouth every 6 (six) hours as needed.    [provider]  amiodarone (PACERONE) 200 MG tablet Take 1 tablet (200 mg total) by mouth 2 (two) times daily. 06/08/18   Stark Jock Jude, MD  atorvastatin (LIPITOR) 40 MG tablet Take 1 tablet (40 mg total) by mouth daily at 6 PM. 06/08/18   Stark Jock, Jude, MD  furosemide (LASIX) 40 MG tablet Take 40 mg by mouth daily.    [provider]  gabapentin (NEURONTIN) 300 MG capsule Take 1 capsule by mouth 2 (two) times daily. 10/07/17 12/23/18  [provider]  glimepiride (AMARYL) 2 MG tablet Take 1 tablet (2 mg total) by  mouth daily with breakfast. 05/18/18   Shaune Pollackhen, Qing, MD  hydrALAZINE (APRESOLINE) 25 MG tablet Take 1 tablet by mouth 3 (three) times daily. 12/23/17   [provider]  levothyroxine (SYNTHROID) 150 MCG tablet Take 1 tablet by mouth daily. 06/16/17 12/23/18  [provider]  metFORMIN (GLUCOPHAGE) 500 MG tablet Take 2 tablets (1,000 mg total) by mouth 2 (two) times daily. Patient taking differently: Take 500 mg by  mouth 2 (two) times daily.  05/17/18 11/23/19  Shaune Pollackhen, Qing, MD  metoprolol tartrate (LOPRESSOR) 50 MG tablet Take 1 tablet (50 mg total) by mouth 2 (two) times daily. 06/08/18   Enid Baasjie, Jude, MD  omeprazole (PRILOSEC) 20 MG capsule Take 40 mg by mouth daily.    [provider]  rivaroxaban (XARELTO) 20 MG TABS tablet Take 1 tablet by mouth daily. 05/09/15 11/16/18  [provider]    Physical Exam: Vitals:   11/17/18 0330  BP: (!) 184/96  Pulse: 69  Temp: 98.4 F (36.9 C)  TempSrc: Oral  SpO2: 97%    Constitutional: NAD, calm  Eyes: PERTLA, lids and conjunctivae normal ENMT: Mucous membranes are moist. Posterior pharynx clear of any exudate or lesions.   Neck: normal, supple, no masses, no thyromegaly Respiratory: no wheezing, no crackles. No accessory muscle use.  Cardiovascular: S1 & S2 heard, regular rate and rhythm. No extremity edema.   Abdomen: No distension, no tenderness, soft. Bowel sounds normal.  Musculoskeletal: no clubbing / cyanosis. No joint deformity upper and lower extremities.    Skin: no significant rashes, lesions, ulcers. Warm, dry, well-perfused. Neurologic: CN 2-12 grossly intact. Sensation intactl. Strength 5/5 in all 4 limbs.  Psychiatric: Alert and oriented x 3. Calm, cooperative.     Labs on Admission: I have personally reviewed following labs and imaging studies  CBC: Recent Labs  Lab 11/16/18 1549  WBC 8.3  NEUTROABS 6.5  HGB 12.0*  HCT 36.5*  MCV 88.0  PLT 164   Basic Metabolic Panel: Recent Labs  Lab 11/16/18 1549  NA 140  K 3.2*  CL 103  CO2 25  GLUCOSE 134*  BUN 22*  CREATININE 1.22  CALCIUM 8.2*   GFR: Estimated Creatinine Clearance: 89.6 mL/min (by C-G formula based on SCr of 1.22 mg/dL). Liver Function Tests: Recent Labs  Lab 11/16/18 1549  AST 54*  ALT 61*  ALKPHOS 80  BILITOT 0.5  PROT 8.0  ALBUMIN 4.2   Recent Labs  Lab 11/16/18 1549  LIPASE 30   No results for input(s): AMMONIA in the last  168 hours. Coagulation Profile: Recent Labs  Lab 11/16/18 1549  INR 1.0   Cardiac Enzymes: No results for input(s): CKTOTAL, CKMB, CKMBINDEX, TROPONINI in the last 168 hours. BNP (last 3 results) No results for input(s): PROBNP in the last 8760 hours. HbA1C: No results for input(s): HGBA1C in the last 72 hours. CBG: No results for input(s): GLUCAP in the last 168 hours. Lipid Profile: No results for input(s): CHOL, HDL, LDLCALC, TRIG, CHOLHDL, LDLDIRECT in the last 72 hours. Thyroid Function Tests: No results for input(s): TSH, T4TOTAL, FREET4, T3FREE, THYROIDAB in the last 72 hours. Anemia Panel: No results for input(s): VITAMINB12, FOLATE, FERRITIN, TIBC, IRON, RETICCTPCT in the last 72 hours. Urine analysis:    Component Value Date/Time   COLORURINE Yellow 09/22/2013 2248   APPEARANCEUR Clear 09/22/2013 2248   LABSPEC >1.060 09/22/2013 2248   PHURINE 5.0 09/22/2013 2248   GLUCOSEU Negative 09/22/2013 2248   HGBUR 1+ 09/22/2013 2248   BILIRUBINUR  Negative 09/22/2013 2248   KETONESUR Negative 09/22/2013 2248   PROTEINUR Negative 09/22/2013 2248   NITRITE Negative 09/22/2013 2248   LEUKOCYTESUR Negative 09/22/2013 2248   Sepsis Labs: @LABRCNTIP (procalcitonin:4,lacticidven:4) ) Recent Results (from the past 240 hour(s))  SARS Coronavirus 2 (CEPHEID- Performed [facility]), Hosp Order     Status: Abnormal   Collection Time: 11/16/18  3:49 PM   Specimen: Nasopharyngeal Swab  Result Value Ref Range Status   SARS Coronavirus 2 POSITIVE (A) NEGATIVE Final    Comment: RESULT CALLED TO, READ BACK BY AND VERIFIED WITH: Tonia GhentJANE RYAN 11/16/18 1717 KLW (NOTE) If result is NEGATIVE SARS-CoV-2 target nucleic acids are NOT DETECTED. The SARS-CoV-2 RNA is generally detectable in upper and lower  respiratory specimens during the acute phase of infection. The lowest  concentration of SARS-CoV-2 viral copies this assay can detect is 250  copies / mL. A negative result  does not preclude SARS-CoV-2 infection  and should not be used as the sole basis for treatment or other  patient management decisions.  A negative result may occur with  improper specimen collection / handling, submission of specimen other  than nasopharyngeal swab, presence of viral mutation(s) within the  areas targeted by this assay, and inadequate number of viral copies  (<250 copies / mL). A negative result must be combined with clinical  observations, patient history, and epidemiological information. If result is POSITIVE SARS-CoV-2 target nucleic acids are DETECTED. The SARS-C oV-2 RNA is generally detectable in upper and lower  respiratory specimens during the acute phase of infection.  Positive  results are indicative of active infection with SARS-CoV-2.  Clinical  correlation with patient history and other diagnostic information is  necessary to determine patient infection status.  Positive results do  not rule out bacterial infection or co-infection with other viruses. If result is PRESUMPTIVE POSTIVE SARS-CoV-2 nucleic acids MAY BE PRESENT.   A presumptive positive result was obtained on the submitted specimen  and confirmed on repeat testing.  While 2019 novel coronavirus  (SARS-CoV-2) nucleic acids may be present in the submitted sample  additional confirmatory testing may be necessary for epidemiological  and / or clinical management purposes  to differentiate between  SARS-CoV-2 and other Sarbecovirus currently known to infect humans.  If clinically indicated additional testing with an alternate test  methodology [id number]) is advised.  The SARS-CoV-2 RNA is generally  detectable in upper and lower respiratory specimens during the acute  phase of infection. The expected result is Negative. Fact Sheet for Patients:  BoilerBrush.com.cyhttps://www.fda.gov/media/136312/download Fact Sheet for Healthcare Providers: https://pope.com/https://www.fda.gov/media/136313/download This test is not yet approved or  cleared by the [place] FDA and has been authorized for detection and/or diagnosis of SARS-CoV-2 by FDA under an Emergency Use Authorization (EUA).  This EUA will remain in effect (meaning this test can be used) for the duration of the COVID-19 declaration under Section 564(b)(1) of the Act, 21 U.S.C. section 360bbb-3(b)(1), unless the authorization is terminated or revoked sooner. Performed at [facility], [address] [place], [place] [address]      Radiological Exams on Admission: Dg Chest Portable 1 View  Result Date: 11/16/2018 CLINICAL DATA:  Chest pain since last evening. EXAM: PORTABLE CHEST 1 VIEW COMPARISON:  06/06/2010 FINDINGS: The stable surgical changes from cardiac surgery. Heart is upper limits of normal in size and stable. Mild tortuosity and calcification of the thoracic aorta. Stable eventration of the right hemidiaphragm. No infiltrates or effusions. The bony thorax is intact.  IMPRESSION: No acute cardiopulmonary findings. Electronically Signed   By: Rudie MeyerP.  Gallerani M.D.   On: 11/16/2018 16:53    EKG: Independently reviewed. Sinus rhythm, RBBB, LAFB, QTc 547 ms.   Assessment/Plan   1. ?NSTEMI; CAD  - Patient has hx of CAD s/p 1v CABG in 2015 and multiple PCI's, now presenting with chest pain and diaphoresis, EKG appeared unchanged, and troponin was elevated to 25, then 30   - He was treated with ASA and NTG by EMS with improvement in pain which eventually resolved completely prior to admission   - Cardiology evaluated patient in ED  - Continue IV heparin infusion, continue cardiac monitoring, trend troponin, continue beta-blocker and statin    2. COVID-19 with acute hypoxic respiratory failure  - Patient denies fevers, cough, or SOB, has clear CXR but is noted to desaturate to mid-80's at rest and is positive for COVID-19  - He was treated in ED with Decadron and supplemental O2  - Check/trend inflammatory markers, continue supportive care, continue  infection-control precautions    3. Hypertension with hypertensive urgency  - BP was elevated on arrival to ED in setting of patient being out of his antihypertensives for 2 weeks, improved with nitroglycerin and resumption of his antihypertensives  - Continue metoprolol and hydralazine    4. Chronic diastolic CHF  - Appears compensated  - EF was preserved on echo from 10/18/18  - Continue oral Lasix, beta-blocker, daily wt    5. Paroxysmal atrial fibrillation  - In sinus rhythm on admission  - CHADS-VASc at least 4 (HTN, CHF, DM, CAD)  - On Xarelto pta, started on IV heparin in ED and cardiology recommendation, will continue  - Continue metoprolol, hold amiodarone initially in light of prolonged QT and repeat EKG in am    6. Hypokalemia  - Potassium is 3.2 on admission  - Replaced, repeat chem panel in am    7. Hypothyroidism  - Continue Synthroid   8. Type II DM - A1c was 9.3% in December 2019  - Managed with metformin and glimepiride at home, held on admission  - Check CBG's and use a low-intensity SSI with Novolog for now   9. Prolonged QT-interval  - QTc is 547 ms in ED  - Continue cardiac monitoring, replace potassium, avoid QT-prolonging medications, hold amiodarone initially, and repeat EKG in am     DVT prophylaxis: CAPR, gown, gloves. Patient wearing mask.  Code Status: Full  Family Communication: Discussed with patient  Consults called: [facility]   Admission status: Inpatient. Patient has multiple acute problems including COVID-19 with new supplemental O2-requirement and possible NSTEMI, and will require intensive inpatient treatments and monitoring to address these life-threatening issues and avoid complications.    Briscoe Deutscherimothy S Aldo Sondgeroth, MD [facility] Pager [phone]  If 7PM-7AM, please contact night-coverage [email or web address] Password [email or web address]  11/17/2018, 5:30 AM

## 2018-11-17 NOTE — ED Notes (Addendum)
Pt gives verbal consent to transfer

## 2018-11-17 NOTE — ED Notes (Signed)
EMTALA reviewed. 

## 2018-11-17 NOTE — Progress Notes (Signed)
ANTICOAGULATION CONSULT NOTE - Initial Consult  Pharmacy Consult for heparin Indication: chest pain/ACS  No Known Allergies  Patient Measurements:   Heparin Dosing Weight: 101.3 kg  Vital Signs: Temp: 98.4 F (36.9 C) (07/01 0330) Temp Source: Oral (07/01 0330) BP: 184/96 (07/01 0330) Pulse Rate: 69 (07/01 0330)  Labs: Recent Labs    11/16/18 1549 11/16/18 1749 11/17/18 0113  HGB 12.0*  --   --   HCT 36.5*  --   --   PLT 164  --   --   APTT  --  28  --   LABPROT 13.3  --   --   INR 1.0  --   --   HEPARINUNFRC  --   --  0.52  CREATININE 1.22  --   --   TROPONINIHS 25* 30*  --     Estimated Creatinine Clearance: 89.6 mL/min (by C-G formula based on SCr of 1.22 mg/dL).   Medical History: Past Medical History:  Diagnosis Date  . AF (paroxysmal atrial fibrillation) ([place]) 06/06/2018  . CAD (coronary artery disease) 05/13/2018  . Chronic diastolic CHF (congestive heart failure) ([place]) 11/17/2018  . Coronary artery disease   . Diabetes mellitus type II, non insulin dependent ([place]) 11/17/2018  . Hypothyroidism 11/17/2018    Medications:  See medication history  Assessment: 58 yo man transferred from [facility] to start heparin infusion.  Patient was on xarelto PTA, reportedly last dose 3 days ago.  He was given a 4000 unit bolus heparin at 18:39.  Heparin level drawn 01:13 was 0.52 units/ml. Heparin drip was not infusing on arrival to unit. Goal of Therapy:  APTT 66-102 sec Heparin level 0.3-0.7 units/ml Monitor platelets by anticoagulation protocol: Yes   Plan:  Resume heparin drip at 1300 units/hr Check heparin level and aPTT in 6 hours  Thanks for allowing pharmacy to be a part of this patient's care.  Excell Seltzer, PharmD Clinical Pharmacist  11/17/2018,5:49 AM

## 2018-11-18 ENCOUNTER — Encounter (HOSPITAL_COMMUNITY): Payer: Self-pay | Admitting: Emergency Medicine

## 2018-11-18 DIAGNOSIS — I480 Paroxysmal atrial fibrillation: Secondary | ICD-10-CM

## 2018-11-18 DIAGNOSIS — J9601 Acute respiratory failure with hypoxia: Secondary | ICD-10-CM | POA: Diagnosis not present

## 2018-11-18 DIAGNOSIS — E119 Type 2 diabetes mellitus without complications: Secondary | ICD-10-CM

## 2018-11-18 DIAGNOSIS — I5032 Chronic diastolic (congestive) heart failure: Secondary | ICD-10-CM

## 2018-11-18 DIAGNOSIS — U071 COVID-19: Secondary | ICD-10-CM

## 2018-11-18 DIAGNOSIS — E876 Hypokalemia: Secondary | ICD-10-CM

## 2018-11-18 DIAGNOSIS — I160 Hypertensive urgency: Secondary | ICD-10-CM

## 2018-11-18 DIAGNOSIS — R079 Chest pain, unspecified: Secondary | ICD-10-CM | POA: Diagnosis not present

## 2018-11-18 DIAGNOSIS — E039 Hypothyroidism, unspecified: Secondary | ICD-10-CM

## 2018-11-18 LAB — CBC WITH DIFFERENTIAL/PLATELET
Abs Immature Granulocytes: 0 10*3/uL (ref 0.00–0.07)
Basophils Absolute: 0.1 10*3/uL (ref 0.0–0.1)
Basophils Relative: 1 %
Eosinophils Absolute: 0 10*3/uL (ref 0.0–0.5)
Eosinophils Relative: 0 %
HCT: 37.5 % — ABNORMAL LOW (ref 39.0–52.0)
Hemoglobin: 12 g/dL — ABNORMAL LOW (ref 13.0–17.0)
Lymphocytes Relative: 5 %
Lymphs Abs: 0.6 10*3/uL — ABNORMAL LOW (ref 0.7–4.0)
MCH: 28.8 pg (ref 26.0–34.0)
MCHC: 32 g/dL (ref 30.0–36.0)
MCV: 89.9 fL (ref 80.0–100.0)
Monocytes Absolute: 0.1 10*3/uL (ref 0.1–1.0)
Monocytes Relative: 1 %
Neutro Abs: 11.3 10*3/uL — ABNORMAL HIGH (ref 1.7–7.7)
Neutrophils Relative %: 93 %
Platelets: 155 10*3/uL (ref 150–400)
RBC: 4.17 MIL/uL — ABNORMAL LOW (ref 4.22–5.81)
RDW: 14.3 % (ref 11.5–15.5)
WBC: 12.2 10*3/uL — ABNORMAL HIGH (ref 4.0–10.5)
nRBC: 0 % (ref 0.0–0.2)
nRBC: 0 /100 WBC

## 2018-11-18 LAB — COMPREHENSIVE METABOLIC PANEL
ALT: 51 U/L — ABNORMAL HIGH (ref 0–44)
AST: 53 U/L — ABNORMAL HIGH (ref 15–41)
Albumin: 3.9 g/dL (ref 3.5–5.0)
Alkaline Phosphatase: 76 U/L (ref 38–126)
Anion gap: 12 (ref 5–15)
BUN: 18 mg/dL (ref 6–20)
CO2: 27 mmol/L (ref 22–32)
Calcium: 8.6 mg/dL — ABNORMAL LOW (ref 8.9–10.3)
Chloride: 100 mmol/L (ref 98–111)
Creatinine, Ser: 1.29 mg/dL — ABNORMAL HIGH (ref 0.61–1.24)
GFR calc Af Amer: >60 mL/min (ref >60)
GFR calc non Af Amer: >60 mL/min (ref >60)
Glucose, Bld: 135 mg/dL — ABNORMAL HIGH (ref 70–99)
Potassium: 3.2 mmol/L — ABNORMAL LOW (ref 3.5–5.1)
Sodium: 139 mmol/L (ref 135–145)
Total Bilirubin: 0.3 mg/dL (ref 0.3–1.2)
Total Protein: 7.6 g/dL (ref 6.5–8.1)

## 2018-11-18 LAB — GLUCOSE 6 PHOSPHATE DEHYDROGENASE
G6PDH: 8.4 U/g{Hb} (ref 4.6–13.5)
Hemoglobin: 11.4 g/dL — ABNORMAL LOW (ref 13.0–17.7)

## 2018-11-18 LAB — D-DIMER, QUANTITATIVE: D-Dimer, Quant: <0.27 ug/mL-FEU (ref 0.00–0.50)

## 2018-11-18 LAB — HEPARIN LEVEL (UNFRACTIONATED): Heparin Unfractionated: 0.46 IU/mL (ref 0.30–0.70)

## 2018-11-18 LAB — C-REACTIVE PROTEIN: CRP: 0.8 mg/dL (ref <1.0)

## 2018-11-18 LAB — CBC
HCT: 34.6 % — ABNORMAL LOW (ref 39.0–52.0)
Hemoglobin: 11.4 g/dL — ABNORMAL LOW (ref 13.0–17.0)
MCH: 29.7 pg (ref 26.0–34.0)
MCHC: 32.9 g/dL (ref 30.0–36.0)
MCV: 90.1 fL (ref 80.0–100.0)
Platelets: 182 10*3/uL (ref 150–400)
RBC: 3.84 MIL/uL — ABNORMAL LOW (ref 4.22–5.81)
RDW: 14.1 % (ref 11.5–15.5)
WBC: 10.1 10*3/uL (ref 4.0–10.5)
nRBC: 0 % (ref 0.0–0.2)

## 2018-11-18 LAB — GLUCOSE, CAPILLARY
Glucose-Capillary: 128 mg/dL — ABNORMAL HIGH (ref 70–99)
Glucose-Capillary: 207 mg/dL — ABNORMAL HIGH (ref 70–99)
Glucose-Capillary: 115 mg/dL — ABNORMAL HIGH (ref 70–99)
Glucose-Capillary: 137 mg/dL — ABNORMAL HIGH (ref 70–99)

## 2018-11-18 LAB — FERRITIN: Ferritin: 115 ng/mL (ref 24–336)

## 2018-11-18 LAB — SEDIMENTATION RATE: Sed Rate: 29 mm/hr — ABNORMAL HIGH (ref 0–16)

## 2018-11-18 LAB — LACTATE DEHYDROGENASE: LDH: 286 U/L — ABNORMAL HIGH (ref 98–192)

## 2018-11-18 MED ORDER — RIVAROXABAN 20 MG PO TABS
20.0000 mg | ORAL_TABLET | Freq: Every day | ORAL | Status: DC
  Administered 2018-11-18 – 2018-11-21 (×4): 20 mg via ORAL
  Filled 2018-11-18 (×5): qty 1

## 2018-11-18 MED ORDER — POTASSIUM CHLORIDE CRYS ER 20 MEQ PO TBCR
40.0000 meq | EXTENDED_RELEASE_TABLET | Freq: Once | ORAL | Status: AC
  Administered 2018-11-18: 40 meq via ORAL
  Filled 2018-11-18: qty 2

## 2018-11-18 NOTE — Progress Notes (Signed)
PROGRESS NOTE    Craig PernaJose Kitchen  ZOX:[id number] DOB: [birth year] DOA: 11/17/2018 PCP: [facility]    Brief Narrative:  Craig Reese is a [age] y.o. male with medical history significant for coronary artery disease status post CABG, type 2 diabetes mellitus, chronic diastolic CHF, atrial fibrillation on Xarelto, hypothyroidism, and hyperlipidemia, now presenting to the emergency department for evaluation of chest pain.  Patient ran out of all of his medications except for Xarelto 2 weeks ago and has only taken the Xarelto since that time.  He continued to be in his usual state until the night of 11/15/2018 when he developed epigastric pain with nausea and nonbloody vomiting.  Following morning, he woke with chest pain, localized to the central and left side of his chest, burning in character, similar to his prior heart attacks.  EMS was called, patient was noted to be diaphoretic, there was concern for possible STEMI on EKG, he was treated with aspirin and nitroglycerin, had improvement in his pain, and he was brought into the ED.  Upon [facility], patient is found to be afebrile, saturating 87% on room air, slightly tachypneic, and hypertensive to 184/96.  EKG features sinus rhythm with RBBB, LAFB, and QTc interval 547 ms.  Chest x-ray is negative for acute cardiopulmonary disease.  Chemistry panel notable for potassium of 3.2 and slight elevation in transaminases.  CBC with a slight normocytic anemia.  Troponin elevated to 25, then elevated further to 30.  BNP is normal.  Cardiology was consulted by the ED physician and recommended medical admission, IV heparin, BP control, and trending of troponins. Cardiology did not recommend any further work up with cardiac cath.    Assessment & Plan:   Principal Problem:   Chest pain Active Problems:   CAD (coronary artery disease)   AF (paroxysmal atrial fibrillation) (HCC)   Diabetes mellitus type II, non insulin dependent  (HCC)   Hypothyroidism   Hypokalemia   Acute respiratory failure with hypoxia (HCC)   COVID-19 virus infection   Hypertensive urgency   Chronic diastolic CHF (congestive heart failure) (HCC)   Prolonged QT interval   Chest pain and diaphoresis ?  NSTEMI Elevated high-sensitivity troponins. Patient denies any chest pain at this time. Cardiology consulted and recommended no indication of any cardiac catheterizations.  Medical management at this time with IV heparin ( transition to xarelto), resume blood pressure medications. Resume aspirin 81 mg, Lipitor 40 mg daily.   Paroxysmal atrial fibrillation Rate controlled ,  continue with xarelto for anticoagulation.   Recent history of COVID 19 viral infection Pt continues to be febrile, having myalgias and is requiring upto 2 lit of [place] oxygen to keep sats greater than 92%. CXR does not show any infiltrates or effusions at this time.    Chronic diastolic heart failure Resume Lasix. Continue with strict intake and output.   Hypertension urgency Resolved   Hypokalemia Replaced.   Mild transaminitis:  Possibly from the viral infection.    Hypothyroidism Resume Synthroid  Type II diabetes mellitus with hyperglycemia Resume sliding scale insulin, Hemoglobin A1c is 6.9.   DVT prophylaxis: XARELTO Code Status: Full code Family Communication: None at bedside Disposition Plan:transfer to [facility].   Consultants:   cardiology   Procedures: None  Antimicrobials: None  Subjective: No chest pain, but pt reports feeling miserable. Some sob today.    Objective: Vitals:   11/17/18 1817 11/17/18 2118 11/18/18 0034 11/18/18 0811  BP: 139/75 (!) 144/77  138/82  Pulse: 64   66  Resp: 18     Temp: 98 F (36.7 C)   (!) 100.6 F (38.1 C)  TempSrc: Oral   Oral  SpO2: 90%  93%   Weight:        Intake/Output Summary (Last 24 hours) at 11/18/2018 1056 Last data filed at 11/18/2018 0331 Gross per 24 hour  Intake 999.8 ml   Output 1250 ml  Net -250.2 ml   Filed Weights   11/17/18 0600  Weight: 127.9 kg    Examination:  General exam: Disheveled appearence, on 2lit of [place] oxygen Respiratory system: clear to auscultation, tachypnea present.  Cardiovascular system: S1 & S2 heard, RRR.  Gastrointestinal system: Abdomen is soft NT ND BS+ Central nervous system: Alert and oriented. Non focal.  Extremities: Symmetric 5 x 5 power. Skin: No rashes, lesions or ulcers Psychiatry:. Mood & affect appropriate.     Data Reviewed: I have personally reviewed following labs and imaging studies  CBC: Recent Labs  Lab 11/16/18 1549 11/17/18 0838 11/18/18 0736  WBC 8.3 6.6 12.2*  NEUTROABS 6.5  --  11.3*  HGB 12.0* 11.3* 12.0*  HCT 36.5* 34.9* 37.5*  MCV 88.0 88.4 89.9  PLT 164 176 782   Basic Metabolic Panel: Recent Labs  Lab 11/16/18 1549 11/17/18 0838 11/18/18 0736  NA 140 140 139  K 3.2* 3.5 3.2*  CL 103 101 100  CO2 25 26 27   GLUCOSE 134* 178* 135*  BUN 22* 18 18  CREATININE 1.22 1.26* 1.29*  CALCIUM 8.2* 7.9* 8.6*  MG  --  1.8  --    GFR: Estimated Creatinine Clearance: 85.9 mL/min (A) (by C-G formula based on SCr of 1.29 mg/dL (H)). Liver Function Tests: Recent Labs  Lab 11/16/18 1549 11/18/18 0736  AST 54* 53*  ALT 61* 51*  ALKPHOS 80 76  BILITOT 0.5 0.3  PROT 8.0 7.6  ALBUMIN 4.2 3.9   Recent Labs  Lab 11/16/18 1549  LIPASE 30   No results for input(s): AMMONIA in the last 168 hours. Coagulation Profile: Recent Labs  Lab 11/16/18 1549  INR 1.0   Cardiac Enzymes: No results for input(s): CKTOTAL, CKMB, CKMBINDEX, TROPONINI in the last 168 hours. BNP (last 3 results) No results for input(s): PROBNP in the last 8760 hours. HbA1C: Recent Labs    11/17/18 0021  HGBA1C 6.9*   CBG: Recent Labs  Lab 11/17/18 2038 11/18/18 0807  GLUCAP 210* 128*   Lipid Profile: No results for input(s): CHOL, HDL, LDLCALC, TRIG, CHOLHDL, LDLDIRECT in the last 72 hours. Thyroid  Function Tests: No results for input(s): TSH, T4TOTAL, FREET4, T3FREE, THYROIDAB in the last 72 hours. Anemia Panel: No results for input(s): VITAMINB12, FOLATE, FERRITIN, TIBC, IRON, RETICCTPCT in the last 72 hours. Sepsis Labs: Recent Labs  Lab 11/17/18 0838  PROCALCITON <0.10    Recent Results (from the past 240 hour(s))  SARS Coronavirus 2 (CEPHEID- Performed in [facility] hospital lab), Hosp Order     Status: Abnormal   Collection Time: 11/16/18  3:49 PM   Specimen: Nasopharyngeal Swab  Result Value Ref Range Status   SARS Coronavirus 2 POSITIVE (A) NEGATIVE Final    Comment: RESULT CALLED TO, READ BACK BY AND VERIFIED WITH: Gwynn Burly 11/16/18 1717 KLW (NOTE) If result is NEGATIVE SARS-CoV-2 target nucleic acids are NOT DETECTED. The SARS-CoV-2 RNA is generally detectable in upper and lower  respiratory specimens during the acute phase of infection. The lowest  concentration of SARS-CoV-2 viral copies this  assay can detect is 250  copies / mL. A negative result does not preclude SARS-CoV-2 infection  and should not be used as the sole basis for treatment or other  patient management decisions.  A negative result may occur with  improper specimen collection / handling, submission of specimen other  than nasopharyngeal swab, presence of viral mutation(s) within the  areas targeted by this assay, and inadequate number of viral copies  (<250 copies / mL). A negative result must be combined with clinical  observations, patient history, and epidemiological information. If result is POSITIVE SARS-CoV-2 target nucleic acids are DETECTED. The SARS-C oV-2 RNA is generally detectable in upper and lower  respiratory specimens during the acute phase of infection.  Positive  results are indicative of active infection with SARS-CoV-2.  Clinical  correlation with patient history and other diagnostic information is  necessary to determine patient infection status.  Positive results do   not rule out bacterial infection or co-infection with other viruses. If result is PRESUMPTIVE POSTIVE SARS-CoV-2 nucleic acids MAY BE PRESENT.   A presumptive positive result was obtained on the submitted specimen  and confirmed on repeat testing.  While 2019 novel coronavirus  (SARS-CoV-2) nucleic acids may be present in the submitted sample  additional confirmatory testing may be necessary for epidemiological  and / or clinical management purposes  to differentiate between  SARS-CoV-2 and other Sarbecovirus currently known to infect humans.  If clinically indicated additional testing with an alternate test  methodology [phone]([id number]) is advised.  The SARS-CoV-2 RNA is generally  detectable in upper and lower respiratory specimens during the acute  phase of infection. The expected result is Negative. Fact Sheet for Patients:  BoilerBrush.com.cyhttps://www.fda.gov/media/136312/download Fact Sheet for Healthcare Providers: https://pope.com/https://www.fda.gov/media/136313/download This test is not yet approved or cleared by the [place] FDA and has been authorized for detection and/or diagnosis of SARS-CoV-2 by FDA under an Emergency Use Authorization (EUA).  This EUA will remain in effect (meaning this test can be used) for the duration of the COVID-19 declaration under Section 564(b)(1) of the Act, 21 U.S.C. section 360bbb-3(b)(1), unless the authorization is terminated or revoked sooner. Performed at [facility], [address] [place], [place] [address]          Radiology Studies: Dg Chest Portable 1 View  Result Date: 11/16/2018 CLINICAL DATA:  Chest pain since last evening. EXAM: PORTABLE CHEST 1 VIEW COMPARISON:  06/06/2010 FINDINGS: The stable surgical changes from cardiac surgery. Heart is upper limits of normal in size and stable. Mild tortuosity and calcification of the thoracic aorta. Stable eventration of the right hemidiaphragm. No infiltrates or effusions. The bony thorax is intact.  IMPRESSION: No acute cardiopulmonary findings. Electronically Signed   By: Rudie MeyerP.  Gallerani M.D.   On: 11/16/2018 16:53        Scheduled Meds: . aspirin EC  81 mg Oral Daily  . atorvastatin  40 mg Oral q1800  . furosemide  40 mg Oral Daily  . gabapentin  300 mg Oral BID  . hydrALAZINE  25 mg Oral TID  . insulin aspart  0-5 Units Subcutaneous QHS  . insulin aspart  0-9 Units Subcutaneous TID WC  . levothyroxine  150 mcg Oral Q0600  . metoprolol tartrate  50 mg Oral BID  . pantoprazole  40 mg Oral Daily  . sodium chloride flush  3 mL Intravenous Q12H   Continuous Infusions: . sodium chloride    . heparin 1,300 Units/hr (11/18/18 0331)     LOS:  1 day    Time spent: 34 minutes.     Kathlen ModyVijaya Basel Defalco, MD [facility] Pager [phone]   If 7PM-7AM, please contact night-coverage [email or web address] Password [email or web address] 11/18/2018, 10:56 AM

## 2018-11-18 NOTE — Progress Notes (Signed)
[place] for heparin Indication: chest pain/ACS  No Known Allergies  Patient Measurements: Weight: 281 lb 15.5 oz (127.9 kg) Heparin Dosing Weight: 101.3 kg  Vital Signs: Temp: 100.6 F (38.1 C) (07/02 0811) Temp Source: Oral (07/02 0811) BP: 138/82 (07/02 0811) Pulse Rate: 66 (07/02 0811)  Labs: Recent Labs    11/16/18 1549 11/16/18 1749 11/17/18 0113 11/17/18 0838 11/17/18 1330 11/18/18 0736  HGB 12.0*  --   --  11.3*  --  12.0*  HCT 36.5*  --   --  34.9*  --  37.5*  PLT 164  --   --  176  --  155  APTT  --  28  --   --  68*  --   LABPROT 13.3  --   --   --   --   --   INR 1.0  --   --   --   --   --   HEPARINUNFRC  --   --  0.52  --  0.40 0.46  CREATININE 1.22  --   --  1.26*  --  1.29*  TROPONINIHS 25* 30*  --  28*  --   --     Estimated Creatinine Clearance: 85.9 mL/min (A) (by C-G formula based on SCr of 1.29 mg/dL (H)).   Medical History: Past Medical History:  Diagnosis Date  . AF (paroxysmal atrial fibrillation) ([place]) 06/06/2018  . CAD (coronary artery disease) 05/13/2018  . Chronic diastolic CHF (congestive heart failure) ([place]) 11/17/2018  . Coronary artery disease   . Diabetes mellitus type II, non insulin dependent ([place]) 11/17/2018  . Hypothyroidism 11/17/2018    Medications:  See medication history  Assessment: 58 yo man transferred from [facility] to start heparin infusion.  Patient was on xarelto PTA, reportedly last dose 3 days ago.    Heparin level therapeutic   Goal of Therapy:  APTT 66-102 sec Heparin level 0.3-0.7 units/ml Monitor platelets by anticoagulation protocol: Yes   Plan:  Continue heparin at 1300 units / hr Follow up AM labs  Thank you Anette Guarneri, PharmD Clinical Pharmacist  11/18/2018,9:46 AM

## 2018-11-19 DIAGNOSIS — U071 COVID-19: Secondary | ICD-10-CM | POA: Diagnosis not present

## 2018-11-19 DIAGNOSIS — I480 Paroxysmal atrial fibrillation: Secondary | ICD-10-CM | POA: Diagnosis not present

## 2018-11-19 DIAGNOSIS — I25119 Atherosclerotic heart disease of native coronary artery with unspecified angina pectoris: Secondary | ICD-10-CM | POA: Diagnosis not present

## 2018-11-19 DIAGNOSIS — R079 Chest pain, unspecified: Secondary | ICD-10-CM | POA: Diagnosis not present

## 2018-11-19 LAB — CBC WITH DIFFERENTIAL/PLATELET
Abs Immature Granulocytes: 0.09 10*3/uL — ABNORMAL HIGH (ref 0.00–0.07)
Basophils Absolute: 0 10*3/uL (ref 0.0–0.1)
Basophils Relative: 0 %
Eosinophils Absolute: 0 10*3/uL (ref 0.0–0.5)
Eosinophils Relative: 0 %
HCT: 36.7 % — ABNORMAL LOW (ref 39.0–52.0)
Hemoglobin: 11.7 g/dL — ABNORMAL LOW (ref 13.0–17.0)
Immature Granulocytes: 1 %
Lymphocytes Relative: 10 %
Lymphs Abs: 1.1 10*3/uL (ref 0.7–4.0)
MCH: 28.8 pg (ref 26.0–34.0)
MCHC: 31.9 g/dL (ref 30.0–36.0)
MCV: 90.4 fL (ref 80.0–100.0)
Monocytes Absolute: 0.3 10*3/uL (ref 0.1–1.0)
Monocytes Relative: 3 %
Neutro Abs: 9.3 10*3/uL — ABNORMAL HIGH (ref 1.7–7.7)
Neutrophils Relative %: 86 %
Platelets: 197 10*3/uL (ref 150–400)
RBC: 4.06 MIL/uL — ABNORMAL LOW (ref 4.22–5.81)
RDW: 14.3 % (ref 11.5–15.5)
WBC: 10.7 10*3/uL — ABNORMAL HIGH (ref 4.0–10.5)
nRBC: 0 % (ref 0.0–0.2)

## 2018-11-19 LAB — COMPREHENSIVE METABOLIC PANEL
ALT: 41 U/L (ref 0–44)
AST: 43 U/L — ABNORMAL HIGH (ref 15–41)
Albumin: 3.8 g/dL (ref 3.5–5.0)
Alkaline Phosphatase: 66 U/L (ref 38–126)
Anion gap: 12 (ref 5–15)
BUN: 14 mg/dL (ref 6–20)
CO2: 26 mmol/L (ref 22–32)
Calcium: 8.3 mg/dL — ABNORMAL LOW (ref 8.9–10.3)
Chloride: 101 mmol/L (ref 98–111)
Creatinine, Ser: 1.2 mg/dL (ref 0.61–1.24)
GFR calc Af Amer: >60 mL/min (ref >60)
GFR calc non Af Amer: >60 mL/min (ref >60)
Glucose, Bld: 139 mg/dL — ABNORMAL HIGH (ref 70–99)
Potassium: 3.3 mmol/L — ABNORMAL LOW (ref 3.5–5.1)
Sodium: 139 mmol/L (ref 135–145)
Total Bilirubin: 0.2 mg/dL — ABNORMAL LOW (ref 0.3–1.2)
Total Protein: 7.2 g/dL (ref 6.5–8.1)

## 2018-11-19 LAB — HEPARIN LEVEL (UNFRACTIONATED): Heparin Unfractionated: >2.2 IU/mL — ABNORMAL HIGH (ref 0.30–0.70)

## 2018-11-19 LAB — GLUCOSE, CAPILLARY
Glucose-Capillary: 132 mg/dL — ABNORMAL HIGH (ref 70–99)
Glucose-Capillary: 186 mg/dL — ABNORMAL HIGH (ref 70–99)
Glucose-Capillary: 158 mg/dL — ABNORMAL HIGH (ref 70–99)
Glucose-Capillary: 255 mg/dL — ABNORMAL HIGH (ref 70–99)

## 2018-11-19 LAB — INTERLEUKIN-6, PLASMA: Interleukin-6, Plasma: 78.7 pg/mL — ABNORMAL HIGH (ref 0.0–12.2)

## 2018-11-19 LAB — FERRITIN: Ferritin: 105 ng/mL (ref 24–336)

## 2018-11-19 LAB — D-DIMER, QUANTITATIVE: D-Dimer, Quant: <0.27 ug/mL-FEU (ref 0.00–0.50)

## 2018-11-19 LAB — SEDIMENTATION RATE: Sed Rate: 30 mm/hr — ABNORMAL HIGH (ref 0–16)

## 2018-11-19 LAB — C-REACTIVE PROTEIN: CRP: 4.4 mg/dL — ABNORMAL HIGH (ref <1.0)

## 2018-11-19 LAB — LACTATE DEHYDROGENASE: LDH: 274 U/L — ABNORMAL HIGH (ref 98–192)

## 2018-11-19 MED ORDER — POTASSIUM CHLORIDE CRYS ER 20 MEQ PO TBCR
40.0000 meq | EXTENDED_RELEASE_TABLET | Freq: Four times a day (QID) | ORAL | Status: AC
  Administered 2018-11-19 (×2): 40 meq via ORAL
  Filled 2018-11-19 (×2): qty 2

## 2018-11-19 MED ORDER — INSULIN ASPART 100 UNIT/ML ~~LOC~~ SOLN
0.0000 [IU] | Freq: Three times a day (TID) | SUBCUTANEOUS | Status: DC
  Administered 2018-11-19 (×2): 2 [IU] via SUBCUTANEOUS
  Administered 2018-11-19: 1 [IU] via SUBCUTANEOUS
  Administered 2018-11-20: 2 [IU] via SUBCUTANEOUS
  Administered 2018-11-20 – 2018-11-21 (×3): 7 [IU] via SUBCUTANEOUS
  Administered 2018-11-21: 18:00:00 3 [IU] via SUBCUTANEOUS

## 2018-11-19 MED ORDER — INSULIN ASPART 100 UNIT/ML ~~LOC~~ SOLN: 0.0000 [IU] | Freq: Three times a day (TID) | SUBCUTANEOUS | Status: DC

## 2018-11-19 MED ORDER — METHYLPREDNISOLONE SODIUM SUCC 40 MG IJ SOLR
40.0000 mg | Freq: Three times a day (TID) | INTRAMUSCULAR | Status: DC
  Administered 2018-11-19 – 2018-11-21 (×7): 40 mg via INTRAVENOUS
  Filled 2018-11-19 (×7): qty 1

## 2018-11-19 MED ORDER — AMIODARONE HCL 100 MG PO TABS
200.0000 mg | ORAL_TABLET | Freq: Every day | ORAL | Status: DC
  Administered 2018-11-19 – 2018-11-21 (×3): 200 mg via ORAL
  Filled 2018-11-19: qty 2
  Filled 2018-11-19 (×3): qty 1

## 2018-11-19 NOTE — Progress Notes (Signed)
Cardiology recommended to resume amiodarone but at a daily dose on admission. D/w Dr Virgel Bouquet today and we will start it today at amiodarone 200mg  PO qday.  Onnie Boer, PharmD, BCIDP, AAHIVP, CPP Infectious Disease Pharmacist 11/19/2018 10:58 AM

## 2018-11-19 NOTE — Progress Notes (Signed)
5:30 am - Pt PaO2 fluctuating between 85-91.  Placed on 1 L O2 by [place]

## 2018-11-19 NOTE — Progress Notes (Signed)
PROGRESS NOTE                                                                                                                                                                                                             Patient Demographics:    Craig Reese, is a 58 y.o. male, DOB - 08/01/1960, XNA:[id number]  Admit date - 11/17/2018   Admitting Physician Vianne Bulls, MD  Outpatient Primary MD for the patient is [facility] [place]  LOS - 2   No chief complaint on file.      Brief Narrative    58 y.o. male with medical history significant for coronary artery disease status post CABG, type 2 diabetes mellitus, chronic diastolic CHF, atrial fibrillation on Xarelto, hypothyroidism, and hyperlipidemia, now presenting to the emergency department for evaluation of chest pain.  Will he tested positive for COVID-19, transferred to [facility] for further care given elevated troponin and concern of an MI, requiring heparin drip, and medical management, transferred to [place] on 11/19/2018 once cleared by cardiology.   Subjective:    Craig Reese today has any chest pain, but report generalized weakness and fatigue, febrile overnight 102.2.   Assessment  & Plan :    Principal Problem:   Chest pain Active Problems:   CAD (coronary artery disease)   AF (paroxysmal atrial fibrillation) (HCC)   Diabetes mellitus type II, non insulin dependent (HCC)   Hypothyroidism   Hypokalemia   Acute respiratory failure with hypoxia (HCC)   COVID-19 virus infection   Hypertensive urgency   Chronic diastolic CHF (congestive heart failure) (HCC)   Prolonged QT interval  Chest pain with elevated troponin -Audiology input greatly appreciated, this felt to be more likely due to demand ischemia in the setting of 19 infection, hypertensive emergency. -On heparin drip for 48 hours, now back on Xarelto -High-sensitivity troponins non-ACS pattern 25> 30> 28   Paroxysmal A. Fib -Initially on heparin GTT, currently on Xarelto -Controlled on metoprolol  COVID-19 infection -Patient is significantly febrile, with elevated CRP which is trending up as well,, he does report some dyspnea today, he will be started on Solu-Medrol, I will continue to trend inflammatory markers closely.    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   Hypertension urgency Resolved  History of CAD -Continue with aspirin, statin and beta-blockers   Hypokalemia Replaced.   Mild transaminitis:  Possibly from the viral infection.    Hypothyroidism Resume Synthroid  Type II diabetes mellitus with hyperglycemia Resume sliding scale insulin, Hemoglobin A1c is 6.9.   Code Status : Full  Family Communication  : D/W patient  Disposition Plan  : home  Consults  :  Cardiology  Procedures  : None  DVT Prophylaxis  :  Xarelto  Lab Results  Component Value Date   PLT 197 11/19/2018    Antibiotics  :    Anti-infectives (From admission, onward)   None        Objective:   Vitals:   11/19/18 0527 11/19/18 0650 11/19/18 0827 11/19/18 0953  BP:   130/61   Pulse:   71 71  Resp:   18 (!) 22  Temp: (!) 101.6 F (38.7 C) 99.8 F (37.7 C) 97.7 F (36.5 C)   TempSrc: Oral Oral Oral   SpO2:   94% 93%  Weight:      Height:        Wt Readings from Last 3 Encounters:  11/19/18 126.5 kg  11/16/18 124.7 kg  06/08/18 124.7 kg     Intake/Output Summary (Last 24 hours) at 11/19/2018 1335 Last data filed at 11/19/2018 1051 Gross per 24  hour  Intake 1020 ml  Output 1225 ml  Net -205 ml     Physical Exam  Awake Alert, Oriented X 3, No new F.N deficits, Normal affect Symmetrical Chest wall movement, Good air movement bilaterally, CTAB RRR,No Gallops,Rubs or new Murmurs, No Parasternal Heave +ve B.Sounds, Abd Soft, No tenderness,  No rebound - guarding or rigidity. No Cyanosis, Clubbing or edema, No new Rash or bruise      Data Review:    CBC Recent Labs  Lab 11/16/18 1549 11/17/18 0838 11/18/18 0736 11/18/18 1700 11/19/18 0400  WBC 8.3 6.6 12.2* 10.1 10.7*  HGB 12.0* 11.3*  11.4* 12.0* 11.4* 11.7*  HCT 36.5* 34.9* 37.5* 34.6* 36.7*  PLT 164 176 155 182 197  MCV 88.0 88.4 89.9 90.1 90.4  MCH 28.9 28.6 28.8 29.7 28.8  MCHC 32.9 32.4 32.0 32.9 31.9  RDW 14.2 13.8 14.3 14.1 14.3  LYMPHSABS 1.3  --  0.6*  --  1.1  MONOABS 0.4  --  0.1  --  0.3  EOSABS 0.1  --  0.0  --  0.0  BASOSABS 0.0  --  0.1  --  0.0    Chemistries  Recent Labs  Lab 11/16/18 1549 11/17/18 0838 11/18/18 0736 11/19/18 0400  NA 140 140 139 139  K 3.2* 3.5 3.2* 3.3*  CL 103 101 100 101  CO2 25 26 27 26   GLUCOSE 134* 178* 135* 139*  BUN 22* 18 18 14   CREATININE 1.22 1.26* 1.29* 1.20  CALCIUM 8.2* 7.9* 8.6* 8.3*  MG  --  1.8  --   --   AST 54*  --  53* 43*  ALT 61*  --  51* 41  ALKPHOS 80  --  76 66  BILITOT 0.5  --  0.3 0.2*   ------------------------------------------------------------------------------------------------------------------ No results for input(s): CHOL, HDL, LDLCALC, TRIG, CHOLHDL, LDLDIRECT in the last 72 hours.  Lab Results  Component Value Date   HGBA1C 6.9 (H) 11/17/2018   ------------------------------------------------------------------------------------------------------------------ No results for input(s): TSH, T4TOTAL, T3FREE, THYROIDAB in the last 72 hours.  Invalid input(s): FREET3  ------------------------------------------------------------------------------------------------------------------ Recent Labs    11/18/18 0736 11/19/18 0445  FERRITIN 115 105    Coagulation profile Recent Labs  Lab 11/16/18 1549  INR 1.0    Recent Labs    11/18/18 0736 11/19/18 0400  DDIMER <0.27 <0.27    Cardiac Enzymes No results for input(s): CKMB, TROPONINI, MYOGLOBIN in the last 168 hours.  Invalid input(s): CK ------------------------------------------------------------------------------------------------------------------    Component Value Date/Time   BNP 56.0 11/16/2018 1552    Inpatient Medications  Scheduled Meds: . amiodarone  200 mg Oral Daily  . aspirin EC  81 mg Oral Daily  . atorvastatin  40 mg Oral q1800  . furosemide  40 mg Oral Daily  . gabapentin  300 mg Oral BID  . hydrALAZINE  25 mg Oral TID  . insulin aspart  0-5 Units Subcutaneous QHS  . insulin aspart  0-9 Units Subcutaneous TID WC  . levothyroxine  150 mcg Oral Q0600  . metoprolol tartrate  50 mg Oral BID  . pantoprazole  40 mg Oral Daily  . rivaroxaban  20 mg Oral Q supper  . sodium chloride flush  3 mL Intravenous Q12H   Continuous Infusions: . sodium chloride     PRN Meds:.sodium chloride, acetaminophen, nitroGLYCERIN, sodium chloride flush  Micro Results Recent Results (from the past 240 hour(s))  SARS Coronavirus 2 (CEPHEID- Performed in [facility] hospital lab), Hosp Order     Status: Abnormal   Collection Time: 11/16/18  3:49 PM   Specimen: Nasopharyngeal Swab  Result Value Ref Range Status   SARS Coronavirus 2 POSITIVE (A) NEGATIVE Final    Comment: RESULT CALLED TO, READ BACK BY AND VERIFIED WITH: Tonia GhentJANE RYAN 11/16/18 1717 KLW (NOTE) If result is NEGATIVE SARS-CoV-2 target nucleic acids are NOT DETECTED. The SARS-CoV-2 RNA is generally detectable in upper and lower  respiratory specimens during the acute phase of infection. The lowest  concentration of SARS-CoV-2  viral copies this assay can detect is 250  copies / mL. A negative result does not preclude SARS-CoV-2 infection  and should not be used as the sole basis for treatment or other  patient management decisions.  A negative result may occur with  improper specimen collection / handling, submission of specimen other  than nasopharyngeal swab, presence of viral mutation(s) within the  areas targeted by this assay, and inadequate number of viral copies  (<250 copies / mL). A negative result must be combined with clinical  observations, patient history, and epidemiological information. If result is POSITIVE SARS-CoV-2 target nucleic acids are DETECTED. The SARS-C oV-2 RNA is generally detectable in upper and lower  respiratory specimens during the acute phase of infection.  Positive  results are indicative of active infection with SARS-CoV-2.  Clinical  correlation with patient history and other diagnostic information is  necessary to determine patient infection status.  Positive results do  not rule out bacterial infection or co-infection with other viruses. If result is PRESUMPTIVE POSTIVE SARS-CoV-2 nucleic acids MAY BE PRESENT.   A presumptive positive result was obtained on the submitted specimen  and confirmed on repeat testing.  While 2019 novel coronavirus  (SARS-CoV-2) nucleic acids may be present in the submitted sample  additional confirmatory testing may be necessary for epidemiological  and / or clinical management purposes  to differentiate between  SARS-CoV-2 and other Sarbecovirus currently known to infect humans.  If clinically indicated additional testing with an alternate test  methodology [phone]) is advised.  The SARS-CoV-2 RNA is generally  detectable in upper and lower respiratory specimens during the acute  phase of infection. The expected result is Negative. Fact Sheet for Patients:  BoilerBrush.com.cyhttps://www.fda.gov/media/136312/download Fact Sheet for Healthcare Providers:  https://pope.com/https://www.fda.gov/media/136313/download This test is not yet approved or cleared by the [place] FDA and has been authorized for detection and/or diagnosis of SARS-CoV-2 by FDA under an Emergency Use Authorization (EUA).  This EUA will remain in effect (meaning this test can be used) for the duration of the COVID-19 declaration under Section 564(b)(1) of the Act, 21 U.S.C. section 360bbb-3(b)(1), unless the authorization is terminated or revoked sooner. Performed at [facility], [address] [place], [place] [address]     Radiology Reports Dg Chest Portable 1 View  Result Date: 11/16/2018 CLINICAL DATA:  Chest pain since last evening. EXAM: PORTABLE CHEST 1 VIEW COMPARISON:  06/06/2010 FINDINGS: The stable surgical changes from cardiac surgery. Heart is upper limits of normal in size and stable. Mild tortuosity and calcification of the thoracic aorta. Stable eventration of the right hemidiaphragm. No infiltrates or effusions. The bony thorax is intact. IMPRESSION: No acute cardiopulmonary findings. Electronically Signed   By: Rudie MeyerP.  Gallerani M.D.   On: 11/16/2018 16:53    Time Spent in minutes  25 minutes   Huey Bienenstockawood Kash Mothershead M.D on 11/19/2018 at 1:35 PM  Between 7am to 7pm - Pager - [phone]  After 7pm go to [email or web address] - password [email or web address]  [facility] -  Office  [phone]

## 2018-11-20 DIAGNOSIS — R079 Chest pain, unspecified: Secondary | ICD-10-CM | POA: Diagnosis not present

## 2018-11-20 DIAGNOSIS — U071 COVID-19: Secondary | ICD-10-CM | POA: Diagnosis not present

## 2018-11-20 DIAGNOSIS — I480 Paroxysmal atrial fibrillation: Secondary | ICD-10-CM | POA: Diagnosis not present

## 2018-11-20 DIAGNOSIS — I5032 Chronic diastolic (congestive) heart failure: Secondary | ICD-10-CM | POA: Diagnosis not present

## 2018-11-20 LAB — GLUCOSE, CAPILLARY
Glucose-Capillary: 253 mg/dL — ABNORMAL HIGH (ref 70–99)
Glucose-Capillary: 190 mg/dL — ABNORMAL HIGH (ref 70–99)
Glucose-Capillary: 314 mg/dL — ABNORMAL HIGH (ref 70–99)
Glucose-Capillary: 345 mg/dL — ABNORMAL HIGH (ref 70–99)
Glucose-Capillary: 383 mg/dL — ABNORMAL HIGH (ref 70–99)

## 2018-11-20 LAB — COMPREHENSIVE METABOLIC PANEL
ALT: 36 U/L (ref 0–44)
AST: 37 U/L (ref 15–41)
Albumin: 4 g/dL (ref 3.5–5.0)
Alkaline Phosphatase: 71 U/L (ref 38–126)
Anion gap: 10 (ref 5–15)
BUN: 18 mg/dL (ref 6–20)
CO2: 25 mmol/L (ref 22–32)
Calcium: 8.8 mg/dL — ABNORMAL LOW (ref 8.9–10.3)
Chloride: 102 mmol/L (ref 98–111)
Creatinine, Ser: 1.15 mg/dL (ref 0.61–1.24)
GFR calc Af Amer: >60 mL/min (ref >60)
GFR calc non Af Amer: >60 mL/min (ref >60)
Glucose, Bld: 211 mg/dL — ABNORMAL HIGH (ref 70–99)
Potassium: 4.4 mmol/L (ref 3.5–5.1)
Sodium: 137 mmol/L (ref 135–145)
Total Bilirubin: 0.4 mg/dL (ref 0.3–1.2)
Total Protein: 7.8 g/dL (ref 6.5–8.1)

## 2018-11-20 LAB — FERRITIN: Ferritin: 153 ng/mL (ref 24–336)

## 2018-11-20 LAB — CBC
HCT: 37.9 % — ABNORMAL LOW (ref 39.0–52.0)
Hemoglobin: 12.3 g/dL — ABNORMAL LOW (ref 13.0–17.0)
MCH: 29.1 pg (ref 26.0–34.0)
MCHC: 32.5 g/dL (ref 30.0–36.0)
MCV: 89.6 fL (ref 80.0–100.0)
Platelets: 214 10*3/uL (ref 150–400)
RBC: 4.23 MIL/uL (ref 4.22–5.81)
RDW: 14 % (ref 11.5–15.5)
WBC: 11.5 10*3/uL — ABNORMAL HIGH (ref 4.0–10.5)
nRBC: 0 % (ref 0.0–0.2)

## 2018-11-20 LAB — D-DIMER, QUANTITATIVE: D-Dimer, Quant: 0.47 ug/mL-FEU (ref 0.00–0.50)

## 2018-11-20 LAB — C-REACTIVE PROTEIN: CRP: 9.2 mg/dL — ABNORMAL HIGH (ref <1.0)

## 2018-11-20 MED ORDER — INSULIN DETEMIR 100 UNIT/ML ~~LOC~~ SOLN
10.0000 [IU] | Freq: Two times a day (BID) | SUBCUTANEOUS | Status: DC
  Administered 2018-11-20: 10 [IU] via SUBCUTANEOUS
  Filled 2018-11-20 (×2): qty 0.1

## 2018-11-20 NOTE — Progress Notes (Signed)
Called pts niece Myriam Jacobson to give her an update. Niece requesting translator. Will call back when a translator is available.  Rufina Falco, RN BSN 11/20/2018 11:25 AM

## 2018-11-20 NOTE — Progress Notes (Signed)
PROGRESS NOTE                                                                                                                                                                                                             Patient Demographics:    Craig Reese, is a 58 y.o. male, DOB - 1960-09-22, ZOX:[id number]  Admit date - 11/17/2018   Admitting Physician Briscoe Deutscherimothy S Opyd, MD  Outpatient Primary [facility] the patient is [facility]  LOS - 3   No chief complaint on file.      Brief Narrative    58 y.o. male with medical history significant for coronary artery disease status post CABG, type 2 diabetes mellitus, chronic diastolic CHF, atrial fibrillation on Xarelto, hypothyroidism, and hyperlipidemia, now presenting to the emergency department for evaluation of chest pain.  Will he tested positive for COVID-19, [facility] care given elevated troponin and concern of an MI, requiring heparin drip, and medical management, transferred to [facility] on 11/19/2018 once cleared by cardiology.   Subjective:    Craig Reese today has any chest pain, but report generalized weakness and fatigue, fever 101.4 yesterday afternoon, still reports generalized weakness, but he is on room air, able to ambulate in the room.   Assessment  & Plan :    Principal Problem:   Chest pain Active Problems:   CAD (coronary artery disease)   AF (paroxysmal atrial fibrillation) (HCC)   Diabetes mellitus type II, non insulin dependent (HCC)   Hypothyroidism   Hypokalemia   Acute respiratory failure with hypoxia (HCC)   COVID-19 virus infection   Hypertensive urgency   Chronic diastolic CHF (congestive heart failure) (HCC)   Prolonged QT interval  Chest pain with elevated troponin -cardiology  input greatly appreciated, this felt to be more likely due to demand ischemia in the setting of COVID 19 infection, hypertensive emergency. -On  heparin drip for intially for 48 hours cardiology recommendation, now back on Xarelto -High-sensitivity troponins non-ACS pattern 25> 30> 28  Paroxysmal A. Fib -Initially on heparin GTT, currently on Xarelto -Controlled on metoprolol  COVID-19 infection -Remains febrile, CRP continues to trend up he does report some dyspnea, not hypoxic, now I will continue with IV Solu-Medrol, no indication for other treatments like Remdesivir, Actemra or Rituxan plasma, will continue  to monitor clinically, if he develops hypoxia will obtain chest x-ray .   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   Hypertension urgency Resolved  History of CAD -Continue with aspirin, statin and beta-blockers   Hypokalemia Replaced.   Mild transaminitis:  Possibly from the viral infection.    Hypothyroidism Resume Synthroid  Type II diabetes mellitus with hyperglycemia - Hemoglobin A1c is 6.9.  Insulin sliding scale, uncontrolled, most likely due to steroids, will start on Levemir 10 units twice daily   Code Status : Full  Family Communication  : D/W patient  Disposition Plan  : home  Consults  :  Cardiology  Procedures  : None  DVT Prophylaxis  :  Xarelto  Lab Results  Component Value Date   PLT 214 11/20/2018    Antibiotics  :    Anti-infectives (From admission, onward)   None        Objective:   Vitals:   11/20/18 0522 11/20/18 0723 11/20/18 0724 11/20/18 0846  BP: (!) 144/46  (!) 144/77   Pulse: (!) 54  (!) 54   Resp:       Temp: 97.6 F (36.4 C) 97.8 F (36.6 C)    TempSrc: Tympanic Oral    SpO2: 96%  96% 94%  Weight:      Height:        Wt Readings from Last 3 Encounters:  11/19/18 126.5 kg  11/16/18 124.7 kg  06/08/18 124.7 kg     Intake/Output Summary (Last 24 hours) at 11/20/2018 1542 Last data filed at 11/19/2018 1822 Gross per 24 hour  Intake 240 ml  Output 400 ml  Net -160 ml     Physical Exam  Awake Alert, Oriented X 3, No new F.N deficits, Normal affect Symmetrical Chest wall movement, Good air movement bilaterally, CTAB RRR,No Gallops,Rubs or new Murmurs, No Parasternal Heave +ve B.Sounds, Abd Soft, No tenderness, No rebound - guarding or rigidity. No Cyanosis, Clubbing or edema, No new Rash or bruise      Data Review:    CBC Recent Labs  Lab 11/16/18 1549 11/17/18 0838 11/18/18 0736 11/18/18 1700 11/19/18 0400 11/20/18 0312  WBC 8.3 6.6 12.2* 10.1 10.7* 11.5*  HGB 12.0* 11.3*  11.4* 12.0* 11.4* 11.7* 12.3*  HCT 36.5* 34.9* 37.5* 34.6* 36.7* 37.9*  PLT 164 176 155 182 197 214  MCV 88.0 88.4 89.9 90.1 90.4 89.6  MCH 28.9 28.6 28.8 29.7 28.8 29.1  MCHC 32.9 32.4 32.0 32.9 31.9 32.5  RDW 14.2 13.8 14.3 14.1 14.3 14.0  LYMPHSABS 1.3  --  0.6*  --  1.1  --   MONOABS 0.4  --  0.1  --  0.3  --   EOSABS 0.1  --  0.0  --  0.0  --   BASOSABS 0.0  --  0.1  --  0.0  --     Chemistries  Recent Labs  Lab  11/16/18 1549 11/17/18 0838 11/18/18 0736 11/19/18 0400 11/20/18 0312  NA 140 140 139 139 137  K 3.2* 3.5 3.2* 3.3* 4.4  CL 103 101 100 101 102  CO2 25 26 27 26 25   GLUCOSE 134* 178* 135* 139* 211*  BUN 22* 18 18 14 18   CREATININE 1.22 1.26* 1.29* 1.20 1.15  CALCIUM 8.2* 7.9* 8.6* 8.3* 8.8*  MG  --  1.8  --   --   --   AST 54*  --  53* 43* 37  ALT 61*  --  51* 41 36  ALKPHOS 80  --  76 66 71  BILITOT 0.5  --  0.3 0.2* 0.4   ------------------------------------------------------------------------------------------------------------------ No results for  input(s): CHOL, HDL, LDLCALC, TRIG, CHOLHDL, LDLDIRECT in the last 72 hours.  Lab Results  Component Value Date   HGBA1C 6.9 (H) 11/17/2018   ------------------------------------------------------------------------------------------------------------------ No results for input(s): TSH, T4TOTAL, T3FREE, THYROIDAB in the last 72 hours.  Invalid input(s): FREET3 ------------------------------------------------------------------------------------------------------------------ Recent Labs    11/19/18 0445 11/20/18 0342  FERRITIN 105 153    Coagulation profile Recent Labs  Lab 11/16/18 1549  INR 1.0    Recent Labs    11/19/18 0400 11/20/18 0312  DDIMER <0.27 0.47    Cardiac Enzymes No results for input(s): CKMB, TROPONINI, MYOGLOBIN in the last 168 hours.  Invalid input(s): CK ------------------------------------------------------------------------------------------------------------------    Component Value Date/Time   BNP 56.0 11/16/2018 1552    Inpatient Medications  Scheduled Meds: . amiodarone  200 mg Oral Daily  . aspirin EC  81 mg Oral Daily  . atorvastatin  40 mg Oral q1800  . furosemide  40 mg Oral Daily  . gabapentin  300 mg Oral BID  . hydrALAZINE  25 mg Oral TID  . insulin aspart  0-5 Units Subcutaneous QHS  . insulin aspart  0-9 Units Subcutaneous TID WC  . levothyroxine  150 mcg Oral Q0600  . methylPREDNISolone (SOLU-MEDROL) injection  40 mg Intravenous Q8H  . metoprolol tartrate  50 mg Oral BID  . pantoprazole  40 mg Oral Daily  . rivaroxaban  20 mg Oral Q supper  . sodium chloride flush  3 mL Intravenous Q12H   Continuous Infusions: . sodium chloride     PRN Meds:.sodium chloride, acetaminophen, nitroGLYCERIN, sodium chloride flush  Micro Results Recent Results (from the past 240 hour(s))  SARS Coronavirus 2 (CEPHEID- Performed in [place] hospital lab), Hosp Order     Status: Abnormal   Collection Time: 11/16/18  3:49 PM   Specimen:  Nasopharyngeal Swab  Result Value Ref Range Status   SARS Coronavirus 2 POSITIVE (A) NEGATIVE Final    Comment: RESULT CALLED TO, READ BACK BY AND VERIFIED WITH: Gwynn Burly 11/16/18 1717 KLW (NOTE) If result is NEGATIVE SARS-CoV-2 target nucleic acids are NOT DETECTED. The SARS-CoV-2 RNA is generally detectable in upper and lower  respiratory specimens during the acute phase of infection. The lowest  concentration of SARS-CoV-2 viral copies this assay can detect is 250  copies / mL. A negative result does not preclude SARS-CoV-2 infection  and should not be used as the sole basis for treatment or other  patient management decisions.  A negative result may occur with  improper specimen collection / handling, submission of specimen other  than nasopharyngeal swab, presence of viral mutation(s) within the  areas targeted by this assay, and inadequate number of viral copies  (<250 copies / mL). A negative result must be combined with clinical  observations,  patient history, and epidemiological information. If result is POSITIVE SARS-CoV-2 target nucleic acids are DETECTED. The SARS-C oV-2 RNA is generally detectable in upper and lower  respiratory specimens during the acute phase of infection.  Positive  results are indicative of active infection with SARS-CoV-2.  Clinical  correlation with patient history and other diagnostic information is  necessary to determine patient infection status.  Positive results do  not rule out bacterial infection or co-infection with other viruses. If result is PRESUMPTIVE POSTIVE SARS-CoV-2 nucleic acids MAY BE PRESENT.   A presumptive positive result was obtained on the submitted specimen  and confirmed on repeat testing.  While 2019 novel coronavirus  (SARS-CoV-2) nucleic acids may be present in the submitted sample  additional confirmatory testing may be necessary for epidemiological  and / or clinical management purposes  to differentiate between   SARS-CoV-2 and other Sarbecovirus currently known to infect humans.  If clinically indicated additional testing with an alternate test  methodology [phone] is advised.  The SARS-CoV-2 RNA is generally  detectable in upper and lower respiratory specimens during the acute  phase of infection. The expected result is Negative. Fact Sheet for Patients:  BoilerBrush.com.cyhttps://www.fda.gov/media/136312/download Fact Sheet for Healthcare Providers: https://pope.com/https://www.fda.gov/media/136313/download This test is not yet approved or cleared by the [place] FDA and has been authorized for detection and/or diagnosis of SARS-CoV-2 by FDA under an Emergency Use Authorization (EUA).  This EUA will remain in effect (meaning this test can be used) for the duration of the COVID-19 declaration under Section 564(b)(1) of the Act, 21 U.S.C. section 360bbb-3(b)(1), unless the authorization is terminated or revoked sooner. Performed at [facility], [address] [place], [place] [address]     Radiology Reports Dg Chest Portable 1 View  Result Date: 11/16/2018 CLINICAL DATA:  Chest pain since last evening. EXAM: PORTABLE CHEST 1 VIEW COMPARISON:  06/06/2010 FINDINGS: The stable surgical changes from cardiac surgery. Heart is upper limits of normal in size and stable. Mild tortuosity and calcification of the thoracic aorta. Stable eventration of the right hemidiaphragm. No infiltrates or effusions. The bony thorax is intact. IMPRESSION: No acute cardiopulmonary findings. Electronically Signed   By: Rudie MeyerP.  Gallerani M.D.   On: 11/16/2018 16:53    Time Spent in minutes  25 minutes   Huey Bienenstockawood Elgergawy M.D on 11/20/2018 at 3:42 PM  Between 7am to 7pm - Pager - [phone]  After 7pm go to [email or web address] - password [email or web address]  [facility] -  Office  [phone]

## 2018-11-20 NOTE — Progress Notes (Signed)
Pt ambulating independently in room, sitting in chair. Denies pain or trouble breathing. Will continue to monitor.  Rufina Falco, RN BSN 11/20/2018 12:24 PM

## 2018-11-20 NOTE — Progress Notes (Signed)
Called pts niece Myriam Jacobson back with interpreter. She denies any questions or concerns at this time. Encouraged to call with any needs. Will continue to monitor.  Rufina Falco, RN BSN 11/20/2018 11:35 AM

## 2018-11-21 DIAGNOSIS — I5032 Chronic diastolic (congestive) heart failure: Secondary | ICD-10-CM | POA: Diagnosis not present

## 2018-11-21 DIAGNOSIS — R079 Chest pain, unspecified: Secondary | ICD-10-CM | POA: Diagnosis not present

## 2018-11-21 DIAGNOSIS — I480 Paroxysmal atrial fibrillation: Secondary | ICD-10-CM | POA: Diagnosis not present

## 2018-11-21 DIAGNOSIS — U071 COVID-19: Secondary | ICD-10-CM | POA: Diagnosis not present

## 2018-11-21 LAB — CBC
HCT: 37.7 % — ABNORMAL LOW (ref 39.0–52.0)
Hemoglobin: 12.1 g/dL — ABNORMAL LOW (ref 13.0–17.0)
MCH: 28.7 pg (ref 26.0–34.0)
MCHC: 32.1 g/dL (ref 30.0–36.0)
MCV: 89.5 fL (ref 80.0–100.0)
Platelets: 265 10*3/uL (ref 150–400)
RBC: 4.21 MIL/uL — ABNORMAL LOW (ref 4.22–5.81)
RDW: 14.1 % (ref 11.5–15.5)
WBC: 18.5 10*3/uL — ABNORMAL HIGH (ref 4.0–10.5)
nRBC: 0 % (ref 0.0–0.2)

## 2018-11-21 LAB — COMPREHENSIVE METABOLIC PANEL
ALT: 33 U/L (ref 0–44)
AST: 26 U/L (ref 15–41)
Albumin: 3.7 g/dL (ref 3.5–5.0)
Alkaline Phosphatase: 80 U/L (ref 38–126)
Anion gap: 9 (ref 5–15)
BUN: 26 mg/dL — ABNORMAL HIGH (ref 6–20)
CO2: 25 mmol/L (ref 22–32)
Calcium: 9.1 mg/dL (ref 8.9–10.3)
Chloride: 103 mmol/L (ref 98–111)
Creatinine, Ser: 1.15 mg/dL (ref 0.61–1.24)
GFR calc Af Amer: >60 mL/min (ref >60)
GFR calc non Af Amer: >60 mL/min (ref >60)
Glucose, Bld: 334 mg/dL — ABNORMAL HIGH (ref 70–99)
Potassium: 4.2 mmol/L (ref 3.5–5.1)
Sodium: 137 mmol/L (ref 135–145)
Total Bilirubin: 0.5 mg/dL (ref 0.3–1.2)
Total Protein: 7.7 g/dL (ref 6.5–8.1)

## 2018-11-21 LAB — FERRITIN: Ferritin: 140 ng/mL (ref 24–336)

## 2018-11-21 LAB — GLUCOSE, CAPILLARY
Glucose-Capillary: 303 mg/dL — ABNORMAL HIGH (ref 70–99)
Glucose-Capillary: 404 mg/dL — ABNORMAL HIGH (ref 70–99)

## 2018-11-21 LAB — C-REACTIVE PROTEIN: CRP: 3.4 mg/dL — ABNORMAL HIGH (ref <1.0)

## 2018-11-21 LAB — D-DIMER, QUANTITATIVE: D-Dimer, Quant: 0.31 ug/mL-FEU (ref 0.00–0.50)

## 2018-11-21 MED ORDER — FUROSEMIDE 40 MG PO TABS: 40.0000 mg | ORAL_TABLET | Freq: Every day | ORAL | 0 refills | Status: DC

## 2018-11-21 MED ORDER — GLIMEPIRIDE 4 MG PO TABS: 4.0000 mg | ORAL_TABLET | Freq: Every day | ORAL | 0 refills | Status: DC

## 2018-11-21 MED ORDER — HYDRALAZINE HCL 25 MG PO TABS: 25.0000 mg | ORAL_TABLET | Freq: Three times a day (TID) | ORAL | 0 refills | Status: DC

## 2018-11-21 MED ORDER — RIVAROXABAN 20 MG PO TABS: 20.0000 mg | ORAL_TABLET | Freq: Every day | ORAL | 0 refills | Status: DC

## 2018-11-21 MED ORDER — GABAPENTIN 300 MG PO CAPS: 300.0000 mg | ORAL_CAPSULE | Freq: Two times a day (BID) | ORAL | 0 refills | Status: DC

## 2018-11-21 MED ORDER — OMEPRAZOLE 20 MG PO CPDR: 40.0000 mg | DELAYED_RELEASE_CAPSULE | Freq: Every day | ORAL | 0 refills | Status: DC

## 2018-11-21 MED ORDER — LEVOTHYROXINE SODIUM 150 MCG PO TABS: 150.0000 ug | ORAL_TABLET | Freq: Every day | ORAL | 0 refills | Status: DC

## 2018-11-21 MED ORDER — INSULIN ASPART 100 UNIT/ML ~~LOC~~ SOLN
20.0000 [IU] | Freq: Once | SUBCUTANEOUS | Status: AC
  Administered 2018-11-21: 20 [IU] via SUBCUTANEOUS

## 2018-11-21 MED ORDER — INSULIN ASPART PROT & ASPART (70-30 MIX) 100 UNIT/ML ~~LOC~~ SUSP
15.0000 [IU] | Freq: Two times a day (BID) | SUBCUTANEOUS | Status: DC
  Administered 2018-11-21 (×2): 15 [IU] via SUBCUTANEOUS
  Filled 2018-11-21: qty 10

## 2018-11-21 MED ORDER — ATORVASTATIN CALCIUM 40 MG PO TABS: 40.0000 mg | ORAL_TABLET | Freq: Every day | ORAL | 0 refills | Status: DC

## 2018-11-21 MED ORDER — INSULIN REGULAR HUMAN 100 UNIT/ML IJ SOLN: 20.0000 [IU] | Freq: Once | INTRAMUSCULAR | Status: DC

## 2018-11-21 MED ORDER — AMIODARONE HCL 200 MG PO TABS: 200.0000 mg | ORAL_TABLET | Freq: Every day | ORAL | 0 refills | Status: DC

## 2018-11-21 MED ORDER — GLIMEPIRIDE 2 MG PO TABS
2.0000 mg | ORAL_TABLET | Freq: Every day | ORAL | Status: DC
  Administered 2018-11-21: 2 mg via ORAL
  Filled 2018-11-21 (×2): qty 1

## 2018-11-21 MED ORDER — DEXAMETHASONE 6 MG PO TABS: 6.0000 mg | ORAL_TABLET | Freq: Every day | ORAL | 0 refills | Status: DC

## 2018-11-21 MED ORDER — METFORMIN HCL 500 MG PO TABS: 1000.0000 mg | ORAL_TABLET | Freq: Two times a day (BID) | ORAL | 2 refills | Status: DC

## 2018-11-21 MED ORDER — METOPROLOL TARTRATE 50 MG PO TABS: 50.0000 mg | ORAL_TABLET | Freq: Two times a day (BID) | ORAL | 0 refills | Status: DC

## 2018-11-21 NOTE — Care Management (Signed)
Case manager will arrange televisit appointment for patient on Monday 11/22/18. He goes to [facility] [employer]. CM will contact patient after appointment is scheduled.     Ricki Miller, RN BSN Case Manager [phone]

## 2018-11-21 NOTE — Progress Notes (Signed)
[place] for xarelto Indication: afib  No Known Allergies  Patient Measurements: Height: 5\' 10"  (177.8 cm) Weight: 275 lb 6.4 oz (124.9 kg) IBW/kg (Calculated) : 73 Heparin Dosing Weight: 101.3 kg  Vital Signs: Temp: 98.8 F (37.1 C) (07/05 0407) Temp Source: Oral (07/05 0407) BP: 138/69 (07/05 0407) Pulse Rate: 56 (07/05 0407)  Labs: Recent Labs    11/19/18 0330 11/19/18 0400 11/20/18 0312 11/21/18 0600  HGB  --  11.7* 12.3* 12.1*  HCT  --  36.7* 37.9* 37.7*  PLT  --  197 214 265  HEPARINUNFRC >2.20*  --   --   --   CREATININE  --  1.20 1.15 1.15    Estimated Creatinine Clearance: 95.2 mL/min (by C-G formula based on SCr of 1.15 mg/dL).   Medical History: Past Medical History:  Diagnosis Date  . AF (paroxysmal atrial fibrillation) ([place]) 06/06/2018  . CAD (coronary artery disease) 05/13/2018  . Chronic diastolic CHF (congestive heart failure) ([place]) 11/17/2018  . Coronary artery disease   . Diabetes mellitus type II, non insulin dependent ([place]) 11/17/2018  . Hypothyroidism 11/17/2018    Medications:  See medication history  Assessment: 58 yo man transferred from [facility] for [place]. He was initially started on heparin for ACS r/o before transitioning back to his Xarelto. His scr and CBC have been stable.     Plan:  Continue Xarelto 20mg  PO qday Rx signs off  Onnie Boer, PharmD, [place], AAHIVP, CPP Infectious Disease Pharmacist 11/21/2018 8:15 AM

## 2018-11-21 NOTE — Discharge Summary (Signed)
Craig Reese, is a 58 y.o. male  DOB Feb 09, 1961  MRN [id number].  Admission date:  11/17/2018  Admitting Physician  Vianne Bulls, MD  Discharge Date:  11/21/2018   Primary MD  [facility] [place]  Recommendations for primary care physician for things to follow:  - please check CBC, BMP during next visit   Admission Diagnosis  COVID-19 VIRUS INFECTION   Discharge Diagnosis  COVID-19 VIRUS INFECTION    Principal Problem:   Chest pain Active Problems:   CAD (coronary artery disease)   AF (paroxysmal atrial fibrillation) (HCC)   Diabetes mellitus type II, non insulin dependent ([place])   Hypothyroidism   Hypokalemia   Acute respiratory failure with hypoxia ([place])   COVID-19 virus infection   Hypertensive urgency   Chronic diastolic CHF (congestive heart failure) (HCC)   Prolonged QT interval      Past Medical History:  Diagnosis Date  . AF (paroxysmal atrial fibrillation) ([place]) 06/06/2018  . CAD (coronary artery disease) 05/13/2018  . Chronic diastolic CHF (congestive heart failure) ([place]) 11/17/2018  . Coronary artery disease   . Diabetes mellitus type II, non insulin dependent ([place]) 11/17/2018  . Hypothyroidism 11/17/2018    Past Surgical History:  Procedure Laterality Date  . CORONARY ARTERY BYPASS GRAFT    . LEFT HEART CATH AND CORS/GRAFTS ANGIOGRAPHY N/A 05/17/2018   Procedure: LEFT HEART CATH AND CORS/GRAFTS ANGIOGRAPHY and possible PCI and stent;  Surgeon: Yolonda Kida, MD;  Location: [facility];  Service: Cardiovascular;  Laterality: N/A;       History of present illness and  Hospital Course:     Kindly see H&P for history of present illness and admission details, please review complete Labs, Consult reports and Test reports for all details in brief  HPI  from the history and physical done on the day of admission 11/17/2018  HPI: Craig Reese is a 58  y.o. male with medical history significant for coronary artery disease status post CABG, type 2 diabetes mellitus, chronic diastolic CHF, atrial fibrillation on Xarelto, hypothyroidism, and hyperlipidemia, now presenting to the emergency department for evaluation of chest pain.  Patient ran out of all of his medications except for Xarelto 2 weeks ago and has only taken the Xarelto since that time.  He continued to be in his usual state until the night of 11/15/2018 when he developed epigastric pain with nausea and nonbloody vomiting.  Following morning, he woke with chest pain, localized to the central and left side of his chest, burning in character, similar to his prior heart attacks.  EMS was called, patient was noted to be diaphoretic, there was concern for possible STEMI on EKG, he was treated with aspirin and nitroglycerin, had improvement in his pain, and he was brought into the ED.  [facility] ED Course: Upon arrival to [facility] ED, patient is found to be afebrile, saturating 87% on room air, slightly tachypneic, and hypertensive to 184/96.  EKG features sinus rhythm with RBBB, LAFB, and QTc interval  547 ms.  Chest x-ray is negative for acute cardiopulmonary disease.  Chemistry panel notable for potassium of 3.2 and slight elevation in transaminases.  CBC with a slight normocytic anemia.  Troponin elevated to 25, then elevated further to 30.  BNP is normal.  Cardiology was consulted by the ED physician and recommended medical admission, IV heparin, BP control, and trending of troponins.   Hospital Course   58 y.o.malewith medical history significant forcoronary artery disease status post CABG, type 2 diabetes mellitus, chronic diastolic CHF, atrial fibrillation on Xarelto, hypothyroidism, and hyperlipidemia, now presenting to the emergency department for evaluation of chest pain.  Will he tested positive for COVID-19, transferred to [facility] for further care given elevated troponin and concern of an  MI, requiring heparin drip, and medical management, transferred to [facility] on 11/19/2018 once cleared by cardiology.   Chest pain with elevated troponin -cardiology  input greatly appreciated, it was  felt to be more likely due to demand ischemia in the setting of COVID 19 infection, and hypertensive emergency. -On heparin drip for intially per cardiology recommendation, now back on Xarelto -High-sensitivity troponins non-ACS pattern 25> 30> 28  Paroxysmal A. Fib -Initially on heparin GTT, currently on Xarelto -Controlled on metoprolol, back on amiodarone, will continue on 200 mg oral daily per cardiology recommendation  COVID-19 infection -Fever, elevated CRP, reports some dyspnea, but no hypoxia, he was started on steroids, he will be discharged on 1 week of Decadron . -Discussed with the patient, to monitor himself closely, and to see if he has worsening dyspnea, or hypoxia to see his PCP or to go back to ED. -He is not hypoxic, with no opacity or infiltrate on imaging, no indication for Actemra, plasma or Remdesivir. 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   Mild transaminitis:  Possibly from the viral infection.   Hypothyroidism On  Synthroid  Type II diabetes mellitus with  hyperglycemia -Hemoglobin A1cis 6.9.   On insulin sliding scale during hospital stay, his CBGs were uncontrolled, this is most likely in setting of COVID-19 infection, and rides, acquired Levemir during hospital stay, but his A1c of 6.9, I would not think he will need insulin at discharge, only adjustment made was increasing his Amaryl dose from 2 to 4 mg oral daily, to continue with metformin.  Have discussed with the patient at home, he reported he is still having Xarelto medication at home, and reports [facility], and he will call him tomorrow his other medications refilled,  he was given prescription on discharge.    Discharge Condition:  stable  Follow UP  Follow-up Information    [facility] week([facility]).   Specialty: General Practice Contact information: [address] [place] [address] [phone]             Discharge Instructions  and  Discharge Medications     Discharge Instructions    Discharge instructions   Complete by: As directed    Follow with Primary MD [facility] in 7 days   Get CBC, CMP,  checked  by Primary MD next visit.    Activity: As tolerated with Full fall precautions use walker/cane & assistance as needed   Disposition Home    Diet: Heart Healthy ,carb modified , with feeding assistance and aspiration precautions.  For Heart failure patients - Check your Weight same time everyday, if you gain over 2 pounds, or you develop in leg swelling, experience more shortness of breath or chest pain, call your Primary MD immediately. Follow Cardiac Low Salt Diet and 1.5 lit/day fluid restriction.   On your next visit with your primary care physician please Get Medicines reviewed and adjusted.   Please request your Prim.MD to go over all Hospital Tests and Procedure/Radiological results at the follow up, please get all Hospital records sent  to your Prim MD by signing hospital release before you go home.   If you experience worsening of your admission symptoms, develop shortness of breath, life threatening emergency, suicidal or homicidal thoughts you must seek medical attention immediately by calling 911 or calling your MD immediately  if symptoms less severe.  You Must read complete instructions/literature along with all the possible adverse reactions/side effects for all the Medicines you take and that have been prescribed to you. Take any new Medicines after you have completely understood and accpet all the possible adverse reactions/side effects.   Do not drive, operating heavy machinery, perform activities at heights, swimming or participation in water activities or provide baby sitting services if your were admitted for syncope or siezures until you have seen by Primary MD or a Neurologist and advised to do so again.  Do not drive when taking Pain medications.    Do not take more than prescribed Pain, Sleep and Anxiety Medications  Special Instructions: If you have smoked or chewed Tobacco  in the last 2 yrs please stop smoking, stop any regular Alcohol  and or any Recreational drug use.  Wear Seat belts while driving.   Please note  You were cared for by a hospitalist during your hospital stay. If you have any questions about your discharge medications or the care you received while you were in the hospital after you are discharged, you can call the unit and asked to speak with the hospitalist on call if the hospitalist that took care of you is not available. Once you are discharged, your primary care physician will handle any further medical issues. Please note that NO REFILLS for any discharge medications will be authorized once you are discharged, as it is imperative that you return to your primary care physician (or establish a relationship with a primary care physician if you do not have one) for your aftercare needs so  that they can reassess your need for medications and monitor your lab values.   Increase activity slowly   Complete by: As directed      Allergies as of 11/21/2018   No Known Allergies     Medication List    TAKE  these medications   acetaminophen 500 MG tablet Commonly known as: TYLENOL Take 500 mg by mouth every 6 (six) hours as needed for mild pain or fever.   amiodarone 200 MG tablet Commonly known as: PACERONE Take 1 tablet (200 mg total) by mouth daily. What changed: when to take this   atorvastatin 40 MG tablet Commonly known as: LIPITOR Take 1 tablet (40 mg total) by mouth daily at 6 PM.   dexamethasone 6 MG tablet Commonly known as: DECADRON Take 1 tablet (6 mg total) by mouth daily. Please take for 7 day   furosemide 40 MG tablet Commonly known as: LASIX Take 1 tablet (40 mg total) by mouth daily.   gabapentin 300 MG capsule Commonly known as: NEURONTIN Take 1 capsule (300 mg total) by mouth 2 (two) times daily.   glimepiride 4 MG tablet Commonly known as: AMARYL Take 1 tablet (4 mg total) by mouth daily with breakfast. What changed:   medication strength  how much to take   hydrALAZINE 25 MG tablet Commonly known as: APRESOLINE Take 1 tablet (25 mg total) by mouth 3 (three) times daily.   levothyroxine 150 MCG tablet Commonly known as: Synthroid Take 1 tablet (150 mcg total) by mouth daily.   metFORMIN 500 MG tablet Commonly known as: GLUCOPHAGE Take 2 tablets (1,000 mg total) by mouth 2 (two) times daily. What changed: how much to take   metoprolol tartrate 50 MG tablet Commonly known as: LOPRESSOR Take 1 tablet (50 mg total) by mouth 2 (two) times daily.   omeprazole 20 MG capsule Commonly known as: PRILOSEC Take 2 capsules (40 mg total) by mouth daily.   rivaroxaban 20 MG Tabs tablet Commonly known as: XARELTO Take 1 tablet by mouth daily.         Diet and Activity recommendation: See Discharge Instructions above   Consults  obtained -  Cardiology   Major procedures and Radiology Reports - PLEASE review detailed and final reports for all details, in brief -      Dg Chest Portable 1 View  Result Date: 11/16/2018 CLINICAL DATA:  Chest pain since last evening. EXAM: PORTABLE CHEST 1 VIEW COMPARISON:  06/06/2010 FINDINGS: The stable surgical changes from cardiac surgery. Heart is upper limits of normal in size and stable. Mild tortuosity and calcification of the thoracic aorta. Stable eventration of the right hemidiaphragm. No infiltrates or effusions. The bony thorax is intact. IMPRESSION: No acute cardiopulmonary findings. Electronically Signed   By: Rudie Meyer M.D.   On: 11/16/2018 16:53    Micro Results    Recent Results (from the past 240 hour(s))  SARS Coronavirus 2 (CEPHEID- Performed in [facility] hospital lab), Hosp Order     Status: Abnormal   Collection Time: 11/16/18  3:49 PM   Specimen: Nasopharyngeal Swab  Result Value Ref Range Status   SARS Coronavirus 2 POSITIVE (A) NEGATIVE Final    Comment: RESULT CALLED TO, READ BACK BY AND VERIFIED WITH: Tonia Ghent 11/16/18 1717 KLW (NOTE) If result is NEGATIVE SARS-CoV-2 target nucleic acids are NOT DETECTED. The SARS-CoV-2 RNA is generally detectable in upper and lower  respiratory specimens during the acute phase of infection. The lowest  concentration of SARS-CoV-2 viral copies this assay can detect is 250  copies / mL. A negative result does not preclude SARS-CoV-2 infection  and should not be used as the sole basis for treatment or other  patient management decisions.  A negative result may occur with  improper specimen collection /  handling, submission of specimen other  than nasopharyngeal swab, presence of viral mutation(s) within the  areas targeted by this assay, and inadequate number of viral copies  (<250 copies / mL). A negative result must be combined with clinical  observations, patient history, and epidemiological information. If  result is POSITIVE SARS-CoV-2 target nucleic acids are DETECTED. The SARS-C oV-2 RNA is generally detectable in upper and lower  respiratory specimens during the acute phase of infection.  Positive  results are indicative of active infection with SARS-CoV-2.  Clinical  correlation with patient history and other diagnostic information is  necessary to determine patient infection status.  Positive results do  not rule out bacterial infection or co-infection with other viruses. If result is PRESUMPTIVE POSTIVE SARS-CoV-2 nucleic acids MAY BE PRESENT.   A presumptive positive result was obtained on the submitted specimen  and confirmed on repeat testing.  While 2019 novel coronavirus  (SARS-CoV-2) nucleic acids may be present in the submitted sample  additional confirmatory testing may be necessary for epidemiological  and / or clinical management purposes  to differentiate between  SARS-CoV-2 and other Sarbecovirus currently known to infect humans.  If clinically indicated additional testing with an alternate test  methodology [phone]([id number]) is advised.  The SARS-CoV-2 RNA is generally  detectable in upper and lower respiratory specimens during the acute  phase of infection. The expected result is Negative. Fact Sheet for Patients:  BoilerBrush.com.cyhttps://www.fda.gov/media/136312/download Fact Sheet for Healthcare Providers: https://pope.com/https://www.fda.gov/media/136313/download This test is not yet approved or cleared by the [place] FDA and has been authorized for detection and/or diagnosis of SARS-CoV-2 by FDA under an Emergency Use Authorization (EUA).  This EUA will remain in effect (meaning this test can be used) for the duration of the COVID-19 declaration under Section 564(b)(1) of the Act, 21 U.S.C. section 360bbb-3(b)(1), unless the authorization is terminated or revoked sooner. Performed at [facility], [address] [place], [place] [address]        Today   Subjective:   Craig PernaJose  Reese today has no headache,no chest abdominal pain,no new weakness tingling or numbness, feels much better wants to go home today.   Objective:   Blood pressure 136/72, pulse (!) 56, temperature 98.3 F (36.8 C), temperature source Oral, resp. rate (!) 22, height 5\' 10"  (1.778 m), weight 124.9 kg, SpO2 96 %.  No intake or output data in the 24 hours ending 11/21/18 1142  Exam Awake Alert, Oriented x 3, No new F.N deficits, Normal affect Symmetrical Chest wall movement, Good air movement bilaterally, CTAB RRR,No Gallops,Rubs or new Murmurs, No Parasternal Heave +ve B.Sounds, Abd Soft, Non tender,  No rebound -guarding or rigidity. No Cyanosis, Clubbing or edema, No new Rash or bruise  Data Review   CBC w Diff:  Lab Results  Component Value Date   WBC 18.5 (H) 11/21/2018   HGB 12.1 (L) 11/21/2018   HGB 11.4 (L) 11/17/2018   HCT 37.7 (L) 11/21/2018   HCT 36.8 (L) 09/22/2013   PLT 265 11/21/2018   PLT 361 09/22/2013   LYMPHOPCT 10 11/19/2018   LYMPHOPCT 15.3 08/19/2013   MONOPCT 3 11/19/2018   MONOPCT 6.4 08/19/2013   EOSPCT 0 11/19/2018   EOSPCT 4.0 08/19/2013   BASOPCT 0 11/19/2018   BASOPCT 1.0 08/19/2013    CMP:  Lab Results  Component Value Date   NA 137 11/21/2018   NA 136 09/22/2013   K 4.2 11/21/2018   K 3.7 09/22/2013   CL 103 11/21/2018  CL 104 09/22/2013   CO2 25 11/21/2018   CO2 23 09/22/2013   BUN 26 (H) 11/21/2018   BUN 12 09/22/2013   CREATININE 1.15 11/21/2018   CREATININE 0.77 09/22/2013   PROT 7.7 11/21/2018   PROT 7.5 08/07/2013   ALBUMIN 3.7 11/21/2018   ALBUMIN 3.6 08/07/2013   BILITOT 0.5 11/21/2018   BILITOT 0.6 08/07/2013   ALKPHOS 80 11/21/2018   ALKPHOS 95 08/07/2013   AST 26 11/21/2018   AST 35 08/07/2013   ALT 33 11/21/2018   ALT 39 08/07/2013  .   Total Time in preparing paper work, data evaluation and todays exam - 35 minutes  Huey Bienenstock M.D on 11/21/2018 at 11:42 AM  [facility]   Office  [phone]

## 2018-11-21 NOTE — Progress Notes (Signed)
[employer] Interpreter Irineo Axon # [phone] utilized for all discharge teaching including prescriptions, Covid self-isolation expectations, s/s to report, when to seek emergency medical treatment, f/u plan, etc. Questions were encouraged and answered.   Patient is having a difficult time locating a friend or family member to transport him home. He is continuing to call his contacts.

## 2018-11-21 NOTE — Discharge Instructions (Signed)
Informacin sobre mi medicina - XARELTO (rivaroxaban)  Esta educacin sobre este medicamento se revis conmigo o con mi representante de atencin mdica como parte de mi preparacin al darme de alta. El farmacutico que habl conmigo durante mi estada en el hospital fue: ____________________________ (nombre del farmacutico).  POR QU SE LE RECET XARELTO ? Xarelto se le recet para reducir el riesgo de que se le formen cogulos de sangre que pueden causar un ataque cerebral si usted tiene una condicin que se llama fibrilacin auricular (un tipo de ritmo cardaco irregular).   QU NECESITA SABER SOBRE XARELTO? Tome su Xarelto UNA VEZ AL DA a la misma hora todos los das con la [place]. Si tiene dificultad para tragar la Tania Ade, puede triturarla y mezclarla con pur de manzana justo antes de tomar su dosis.  Tome Xarelto exactamente segn se lo recet su mdico y NO deje de tomar Xarelto sin hablar con el mdico que Cytogeneticist. Si deja de tomar el medicamento sin otro que tome TEFL teacher de Xarelto para prevenir un ataque cerebral, esto puede aumentar su riesgo de desarrollar un cogulo que cause un ataque cerebral. Vuelva a surtir su receta antes de que se le acabe.  Despus de darle de alta, usted debe tener citas de control con el representante de atencin mdica que le est recetando su Xarelto. En el futuro su dosis puede ser cambiada si hay cambios en su funcin renal o en su peso en una cantidad significativa.  QU HACER SI SE LE OLVIDA TOMAR UNA DOSIS? Si usted est tomando Xarelto UNA VEZ AL DA y Charissa Bash dosis, tmela tan pronto lo recuerde en el mismo da, luego al da siguiente contine con su horario regular de Conservator, museum/gallery. No tome dos dosis de Xarelto al mismo tiempo o en el [employer].  INFORMACIN IMPORTANTE DE SEGURIDAD Un posible efecto secundario de Xarelto es el sangrado. Debe llamar a su proveedor de atencin mdica si  usted experimenta cualquiera de los siguientes sntomas: ? Sangrado de una lesin o de la nariz que no cesa de parar. ? Color extrao de la orina (rojo o marrn oscuro) o de las heces fecales (rojo o negro). ? Moretones raros por [employer]. ? Una cada grave o si se golpea la cabeza (incluso si no hay sangrado).  Algunos medicamentos pueden interactuar con Xarelto y podran aumentar su riesgo de sangrado mientras que est tomando Xarelto. Para ayudar a evitar esto, consulte a su proveedor de atencin mdica o a su farmacutico antes de usar cualquier medicamento nuevo con receta o sin receta, incluyendo hierbas, vitaminas, medicamentos antiinflamatorios sin esteroides (AINEs) y suplementos.  Esta pgina web tiene ms informacin sobre Xarelto: [email or web address]    COVID-19 COVID-19 La COVID-19 es una infeccin respiratoria causada por un virus llamado coronavirus tipo 2 causante del sndrome respiratorio agudo grave (SARS-CoV-2). La enfermedad tambin se conoce como enfermedad por coronavirus o nuevo coronavirus. En algunas personas, el virus puede no ocasionar sntomas. En otras, puede producir una infeccin grave. La infeccin puede empeorar rpidamente y causar complicaciones, como:  Neumona o infeccin en los pulmones.  Sndrome de dificultad respiratoria aguda o SDRA. Se trata de la acumulacin de lquido en los pulmones.  Insuficiencia respiratoria aguda. Se trata de una afeccin en la que no pasa suficiente oxgeno de los pulmones al cuerpo.  Sepsis o choque sptico. Se trata de una reaccin grave del cuerpo ante una infeccin.  Problemas de coagulacin.  Infecciones secundarias debido  a bacterias u hongos. El virus que causa la COVID-19 es contagioso. Esto significa que puede transmitirse de Burk[place] Fas[place]a persona a otra a travs de las gotitas de saliva de la tos y de los estornudos (secreciones respiratorias). Cules son las causas? Esta enfermedad es causada por un  virus. Usted puede contagiarse con este virus:  Al aspirar las gotitas que una persona infectada elimina al toser o Engineering geologistestornudar.  Al tocar algo, como una mesa o el picaportes de [place]erryuna puerta, que estuvo expuesto al virus (contaminado) y luego tocarse la boca, nariz o los ojos. Qu incrementa el riesgo? Riesgo de infeccin Es ms probable que se infecte con este virus si:  Vive o viaja a una zona donde hay un brote de COVID-19.  Carollee MassedEntra en contacto con una persona enferma que recientemente viaj a una zona con un brote de COVID-19.  Cuida o vive con una persona infectada con COVID-19. Riesgo de enfermedad grave Es ms probable que se enferme gravemente por el virus si:  Tiene 67aos o ms.  Tiene una enfermedad crnica que disminuye la capacidad del cuerpo para combatir las infecciones (immunocomprometido).  Vive en un hogar de ancianos o centro de atencin a Air cabin crewlargo plazo.  Tiene una enfermedad prolongada (crnica), como las siguientes: ? Enfermedad pulmonar crnica, que incluye la enfermedad pulmonar obstructiva crnica o asma. ? Enfermedad cardaca. ? Diabetes. ? Enfermedad renal crnica. ? Enfermedad heptica.  Es obeso. Cules son los signos o sntomas? Los sntomas de esta afeccin pueden ser de leves a graves. Los sntomas pueden aparecer en el trmino de 2 a [address]14 das despus de haber estado expuesto al virus. Incluyen los siguientes:  WillcoxFiebre.  Tos.  Dificultad para respirar.  Escalofros.  Dolores musculares.  Dolor de Advertising copywritergarganta.  Prdida del gusto o Cabin crewel olfato. Algunas personas tambin pueden Matteltener problemas estomacales, como nuseas, vmitos o diarrea. Es posible que otras personas no tengan sntomas de COVID-19. Cmo se diagnostica? Esta afeccin se puede diagnosticar en funcin de lo siguiente:  Sus signos y sntomas, especialmente si: ? Vive en una zona donde hay un brote de COVID-19. ? Viaj recientemente a una zona donde el virus es frecuente. ? Cuida o  vive con Neomia Dearuna persona a quien se le diagnostic COVID-19.  Un examen fsico.  Anlisis de laboratorio que pueden incluir: ? Un hisopado nasal para tomar Colombiauna muestra de lquido de la nariz. ? Un hisopado de garganta para tomar Lauris Poaguna muestra de lquido de la garganta. ? Una muestra de mucosidad de los pulmones (esputo). ? Anlisis de Pitcairnsangre.  Los estudios de diagnstico por imgenes pueden incluir radiografas, exploracin por tomografa computarizada (TC) o ecografa. Cmo se trata? En este momento, no hay ningn medicamento para tratar la COVID-19. Los medicamentos para tratar otras enfermedades se usan a modo de ensayo para comprobar si son eficaces contra la COVID-19. El mdico le informar sobre las maneras de tratar los sntomas. En la Franklin Resourcesmayora de las personas, la infeccin es leve y puede controlarse en el hogar con reposo, lquidos y medicamentos de Bonner Springsventa libre. El tratamiento para una infeccin grave suele realizarse en la unidad de cuidados intensivos (UCI) de un hospital. Puede incluir uno o ms de los siguientes. Estos tratamientos se administran hasta que los sntomas mejoran.  Recibir lquidos y United Parcelmedicamentos a travs de una va intravenosa.  Oxgeno complementario. Para administrar oxgeno extra, se Coc[place] (Ke[place]) Islandsutiliza un tubo en la Darene Lamernariz, una mascarilla o una campana de oxgeno.  Colocarlo para que se recueste boca abajo (decbito prono). Esto  facilita el ingreso de oxgeno a los pulmones.  Uso continuo de Comorosuna mquina de presin positiva de las vas areas (CPAP) o de presin positiva de las vas areas de dos niveles (BPAP). Este tratamiento utiliza una presin de aire leve para Pharmacologistmantener las vas respiratorias abiertas. Un tubo conectado a un motor administra oxgeno al cuerpo.  Respirador. Este tratamiento mueve el aire dentro y fuera de los pulmones mediante el uso de un tubo que se coloca en la trquea.  Traqueostoma. En este procedimiento se hace un orificio en el cuello para insertar  un tubo de respiracin.  Oxigenacin por membrana extracorprea (OMEC). En este procedimiento, los pulmones tienen la posibilidad de recuperarse al asumir las funciones del corazn y los pulmones. Suministra oxgeno al cuerpo y elimina el dixido de carbono. Siga estas instrucciones en su casa: Estilo de vida  Si est enfermo, qudese en su casa, excepto para obtener atencin mdica. El mdico le indicar cunto tiempo debe quedarse en casa. Llame al mdico antes de buscar atencin mdica.  Haga reposo en su casa como se lo haya indicado el mdico.  No consuma ningn producto que contenga nicotina o tabaco, como cigarrillos, cigarrillos electrnicos y tabaco de Theatre managermascar. Si necesita ayuda para dejar de fumar, consulte al mdico.  Retome sus actividades normales como se lo haya indicado el mdico. Pregntele al mdico qu actividades son seguras para usted. Instrucciones generales  Use los medicamentos de venta libre y los recetados solamente como se lo haya indicado el mdico.  Beba suficiente lquido como para Pharmacologistmantener la orina de color amarillo plido.  Concurra a todas las visitas de [address]seguimiento como se lo haya indicado el mdico. Esto es importante. Cmo se evita?  No hay ninguna vacuna que ayude a prevenir la infeccin por la COVID-19. Sin embargo, hay medidas que puede tomar para protegerse y Conservator, museum/galleryproteger a Economistotras personas de este virus. Para protegerse:   No viaje a zonas donde la COVID-19 sea un riesgo. Las zonas donde se informa la presencia de la COVID-19 Kuwaitcambian con frecuencia. Para identificar las zonas de alto riesgo y las restricciones de viaje, consulte el sitio web de viajes de Building control surveyorlos Centers for Micron TechnologyDisease Control and Prevention Insurance claims handler(CDC) (Centros para el Control y la Prevencin de Event organisernfermedades): StageSync.siwwwnc.cdc.gov/travel/notices  Si vive o debe viajar a una zona donde COVID-19 es un riesgo, tome precauciones para evitar infecciones. ? Aljese de Engelhard Corporationlas personas enfermas. ? Lvese las manos  frecuentemente con agua y [place] Swanzeyjabn durante 20segundos. Use desinfectante para manos con alcohol si no dispone de [place]agua y Belarusjabn. ? Evite tocarse la boca, la cara, los ojos o la Camargonariz. ? Evite salir de su casa, siga las indicaciones de su estado y de las autoridades sanitarias locales. ? Si debe salir de su casa, use un barbijo de tela o una mascarilla facial. ? Desinfecte los objetos y las superficies que se tocan con frecuencia todos Rock Ridgelos das. Pueden incluir:  Encimeras y Otwellmesas.  Picaportes e interruptores de luz.  Lavabos, fregaderos y grifos.  Aparatos electrnicos tales como telfonos, controles remotos, teclados, computadoras y tabletas. Cmo proteger a los dems: Si tiene sntomas de la COVID-19, tome medidas para evitar que el virus se propague a Economistotras personas.  Si cree que tiene una infeccin por la COVID-19, comunquese de inmediato con su mdico. Informe al equipo de atencin mdica que cree que puede tener una infeccin por la COVID-19.  Qudese en su casa. Salga de su casa solo para buscar atencin mdica. No utilice el  transporte pblico.  No viaje mientras est enfermo.  Lvese las manos frecuentemente con agua y Pinecraftjabn durante 20segundos. Usar desinfectante para manos con alcohol si no dispone de Franceagua y Belarusjabn.  Mantngase alejado de quienes vivan con usted. Permita que los miembros de la familia sanos cuiden a los nios y las Cer[place] Gordomascotas, si es posible. Si tiene que cuidar a los nios o las mascotas, lvese las manos con frecuencia y use un barbijo. Si es posible, permanezca en su habitacin, separado de los dems. Utilice un bao diferente.  Asegrese de que todas las personas que viven en su casa se laven bien las manos y con frecuencia.  Tosa o estornude en un pauelo de papel o sobre su manga o codo. No tosa o estornude al aire ni se cubra la boca o la nariz con la Springvillemano.  Use un barbijo de tela o una mascarilla facial. Dnde buscar ms informacin  Centers for Disease  Control and Prevention (Centros para el Control y la Prevencin de Event organisernfermedades): StickerEmporium.tnwww.cdc.gov/coronavirus/2019-ncov/index.html  World Health Organization (Organizacin Mundial de la Salud): https://thompson-craig.com/www.who.int/health-topics/coronavirus Comunquese con un mdico si:  Vive o ha viajado a una zona donde la COVID-19 es un riesgo y tiene sntomas de infeccin.  Ha tenido contacto con alguien que tiene COVID-19 y usted tiene sntomas de infeccin. Solicite ayuda de inmediato si:  Tiene dificultad para respirar.  Siente dolor u opresin en el pecho.  Experimenta confusin.  Tiene las uas de los dedos y los labios de color Deeringazulado.  Tiene dificultad para despertarse.  Los sntomas empeoran. Estos sntomas pueden representar un problema grave que constituye Radio broadcast assistantuna emergencia. No espere a ver si los sntomas desaparecen. Solicite atencin mdica de inmediato. Comunquese con el servicio de emergencias de su localidad (911 en los [place]). No conduzca por sus propios medios Dollar Generalhasta el hospital. Informe al personal mdico de emergencias si cree que tiene COVID-19. Resumen  La COVID-19 es una infeccin respiratoria causada por un virus. Tambin se conoce como enfermedad por coronavirus o nuevo coronavirus. Puede causar infecciones graves, como neumona, sndrome de dificultad respiratoria aguda, insuficiencia respiratoria aguda o sepsis.  El virus que causa la COVID-19 es contagioso. Esto significa que puede transmitirse de Burkina Fasouna persona a otra a travs de las gotitas de saliva de la tos y de los estornudos.  Es ms probable que desarrolle una enfermedad grave si tiene 65 aos o ms, tiene un sistema inmunitario dbil, vive en un hogar de ancianos o tiene enfermedad crnica.  No hay ningn medicamento para tratar la COVID-19. El mdico le informar sobre las maneras de tratar los sntomas.  Tome medidas para protegerse y Conservator, museum/galleryproteger a los Merchandiser, retaildems contra las infecciones. Lvese las manos con frecuencia y  desinfecte los objetos y las superficies que se tocan con frecuencia todos Caledonialos das. Mantngase alejado de las personas que estn enfermas y use un barbijo si est enfermo. Esta informacin no tiene Theme park managercomo fin reemplazar el consejo del mdico. Asegrese de hacerle al mdico cualquier pregunta que tenga. Document Released: 07/03/2018 Document Revised: 10/05/2018 Document Reviewed: 07/03/2018 Elsevier Patient Education  2020 [employer]vin[employer]Elsevier Inc.

## 2018-11-21 NOTE — Plan of Care (Signed)
Patient discharge teaching completed see note.

## 2018-11-22 LAB — INTERLEUKIN-6, PLASMA: Interleukin-6, Plasma: 67 pg/mL — ABNORMAL HIGH (ref 0.0–12.2)

## 2018-11-22 NOTE — Care Management (Addendum)
11/22/18 2091  Case manager called [facility] and scheduled Televisit follow up appointment for Tuesday, November 30, 2018 at 9:00 am. Case manager will contact patient with this information. Case manager contacted patient using the [place] interpreter [phone]).

## 2019-08-15 ENCOUNTER — Ambulatory Visit: Payer: Self-pay | Attending: Internal Medicine

## 2020-04-07 ENCOUNTER — Observation Stay
Admission: EM | Admit: 2020-04-07 | Discharge: 2020-04-08 | Disposition: A | Discharge status: Home / Self Care | Payer: Self-pay | Attending: Internal Medicine | Admitting: Internal Medicine

## 2020-04-07 ENCOUNTER — Emergency Department: Payer: Self-pay

## 2020-04-07 ENCOUNTER — Other Ambulatory Visit: Payer: Self-pay

## 2020-04-07 ENCOUNTER — Observation Stay
Admit: 2020-04-07 | Discharge: 2020-04-07 | Disposition: A | Discharge status: Home / Self Care | Payer: Self-pay | Attending: Internal Medicine | Admitting: Internal Medicine

## 2020-04-07 DIAGNOSIS — Z8616 Personal history of COVID-19: Secondary | ICD-10-CM | POA: Insufficient documentation

## 2020-04-07 DIAGNOSIS — R9431 Abnormal electrocardiogram [ECG] [EKG]: Secondary | ICD-10-CM | POA: Insufficient documentation

## 2020-04-07 DIAGNOSIS — E1165 Type 2 diabetes mellitus with hyperglycemia: Secondary | ICD-10-CM

## 2020-04-07 DIAGNOSIS — Z7901 Long term (current) use of anticoagulants: Secondary | ICD-10-CM | POA: Insufficient documentation

## 2020-04-07 DIAGNOSIS — I110 Hypertensive heart disease with heart failure: Secondary | ICD-10-CM | POA: Insufficient documentation

## 2020-04-07 DIAGNOSIS — E876 Hypokalemia: Secondary | ICD-10-CM | POA: Diagnosis present

## 2020-04-07 DIAGNOSIS — R778 Other specified abnormalities of plasma proteins: Secondary | ICD-10-CM | POA: Insufficient documentation

## 2020-04-07 DIAGNOSIS — R0600 Dyspnea, unspecified: Secondary | ICD-10-CM

## 2020-04-07 DIAGNOSIS — D72829 Elevated white blood cell count, unspecified: Secondary | ICD-10-CM | POA: Diagnosis present

## 2020-04-07 DIAGNOSIS — I200 Unstable angina: Secondary | ICD-10-CM

## 2020-04-07 DIAGNOSIS — I4891 Unspecified atrial fibrillation: Principal | ICD-10-CM | POA: Insufficient documentation

## 2020-04-07 DIAGNOSIS — I5032 Chronic diastolic (congestive) heart failure: Secondary | ICD-10-CM | POA: Insufficient documentation

## 2020-04-07 DIAGNOSIS — Z951 Presence of aortocoronary bypass graft: Secondary | ICD-10-CM | POA: Insufficient documentation

## 2020-04-07 DIAGNOSIS — Z79899 Other long term (current) drug therapy: Secondary | ICD-10-CM | POA: Insufficient documentation

## 2020-04-07 DIAGNOSIS — E039 Hypothyroidism, unspecified: Secondary | ICD-10-CM | POA: Insufficient documentation

## 2020-04-07 DIAGNOSIS — I480 Paroxysmal atrial fibrillation: Secondary | ICD-10-CM

## 2020-04-07 DIAGNOSIS — I2510 Atherosclerotic heart disease of native coronary artery without angina pectoris: Secondary | ICD-10-CM | POA: Diagnosis present

## 2020-04-07 DIAGNOSIS — E785 Hyperlipidemia, unspecified: Secondary | ICD-10-CM | POA: Diagnosis present

## 2020-04-07 DIAGNOSIS — R7989 Other specified abnormal findings of blood chemistry: Secondary | ICD-10-CM | POA: Diagnosis present

## 2020-04-07 DIAGNOSIS — Z20822 Contact with and (suspected) exposure to covid-19: Secondary | ICD-10-CM | POA: Insufficient documentation

## 2020-04-07 DIAGNOSIS — E1169 Type 2 diabetes mellitus with other specified complication: Secondary | ICD-10-CM

## 2020-04-07 DIAGNOSIS — D6869 Other thrombophilia: Secondary | ICD-10-CM

## 2020-04-07 DIAGNOSIS — R945 Abnormal results of liver function studies: Secondary | ICD-10-CM

## 2020-04-07 DIAGNOSIS — E119 Type 2 diabetes mellitus without complications: Secondary | ICD-10-CM | POA: Insufficient documentation

## 2020-04-07 DIAGNOSIS — I10 Essential (primary) hypertension: Secondary | ICD-10-CM | POA: Diagnosis present

## 2020-04-07 DIAGNOSIS — I25119 Atherosclerotic heart disease of native coronary artery with unspecified angina pectoris: Secondary | ICD-10-CM | POA: Insufficient documentation

## 2020-04-07 DIAGNOSIS — R079 Chest pain, unspecified: Secondary | ICD-10-CM | POA: Diagnosis present

## 2020-04-07 DIAGNOSIS — Z7984 Long term (current) use of oral hypoglycemic drugs: Secondary | ICD-10-CM | POA: Insufficient documentation

## 2020-04-07 LAB — MAGNESIUM: Magnesium: 2.1 mg/dL (ref 1.7–2.4)

## 2020-04-07 LAB — HEPARIN LEVEL (UNFRACTIONATED)
Heparin Unfractionated: 0.12 IU/mL — ABNORMAL LOW (ref 0.30–0.70)
Heparin Unfractionated: 0.25 IU/mL — ABNORMAL LOW (ref 0.30–0.70)

## 2020-04-07 LAB — HIV ANTIBODY (ROUTINE TESTING W REFLEX): HIV Screen 4th Generation wRfx: NONREACTIVE

## 2020-04-07 LAB — HEPATIC FUNCTION PANEL
ALT: 66 U/L — ABNORMAL HIGH (ref 0–44)
AST: 53 U/L — ABNORMAL HIGH (ref 15–41)
Albumin: 4 g/dL (ref 3.5–5.0)
Alkaline Phosphatase: 74 U/L (ref 38–126)
Bilirubin, Direct: <0.1 mg/dL (ref 0.0–0.2)
Total Bilirubin: 0.6 mg/dL (ref 0.3–1.2)
Total Protein: 7.8 g/dL (ref 6.5–8.1)

## 2020-04-07 LAB — HEMOGLOBIN A1C
Hgb A1c MFr Bld: 7.9 % — ABNORMAL HIGH (ref 4.8–5.6)
Mean Plasma Glucose: 180.03 mg/dL

## 2020-04-07 LAB — CBC WITH DIFFERENTIAL/PLATELET
Abs Immature Granulocytes: 0.08 10*3/uL — ABNORMAL HIGH (ref 0.00–0.07)
Basophils Absolute: 0.2 10*3/uL — ABNORMAL HIGH (ref 0.0–0.1)
Basophils Relative: 1 %
Eosinophils Absolute: 1.2 10*3/uL — ABNORMAL HIGH (ref 0.0–0.5)
Eosinophils Relative: 10 %
HCT: 37.6 % — ABNORMAL LOW (ref 39.0–52.0)
Hemoglobin: 12.4 g/dL — ABNORMAL LOW (ref 13.0–17.0)
Immature Granulocytes: 1 %
Lymphocytes Relative: 15 %
Lymphs Abs: 1.9 10*3/uL (ref 0.7–4.0)
MCH: 28.6 pg (ref 26.0–34.0)
MCHC: 33 g/dL (ref 30.0–36.0)
MCV: 86.8 fL (ref 80.0–100.0)
Monocytes Absolute: 0.8 10*3/uL (ref 0.1–1.0)
Monocytes Relative: 7 %
Neutro Abs: 8.2 10*3/uL — ABNORMAL HIGH (ref 1.7–7.7)
Neutrophils Relative %: 66 %
Platelets: 309 10*3/uL (ref 150–400)
RBC: 4.33 MIL/uL (ref 4.22–5.81)
RDW: 14.2 % (ref 11.5–15.5)
WBC: 12.3 10*3/uL — ABNORMAL HIGH (ref 4.0–10.5)
nRBC: 0 % (ref 0.0–0.2)

## 2020-04-07 LAB — COMPREHENSIVE METABOLIC PANEL
ALT: 64 U/L — ABNORMAL HIGH (ref 0–44)
AST: 49 U/L — ABNORMAL HIGH (ref 15–41)
Albumin: 4.1 g/dL (ref 3.5–5.0)
Alkaline Phosphatase: 91 U/L (ref 38–126)
Anion gap: 12 (ref 5–15)
BUN: 16 mg/dL (ref 6–20)
CO2: 25 mmol/L (ref 22–32)
Calcium: 8.8 mg/dL — ABNORMAL LOW (ref 8.9–10.3)
Chloride: 101 mmol/L (ref 98–111)
Creatinine, Ser: 1 mg/dL (ref 0.61–1.24)
GFR, Estimated: >60 mL/min (ref >60)
Glucose, Bld: 235 mg/dL — ABNORMAL HIGH (ref 70–99)
Potassium: 3.4 mmol/L — ABNORMAL LOW (ref 3.5–5.1)
Sodium: 138 mmol/L (ref 135–145)
Total Bilirubin: 0.5 mg/dL (ref 0.3–1.2)
Total Protein: 8.2 g/dL — ABNORMAL HIGH (ref 6.5–8.1)

## 2020-04-07 LAB — RESP PANEL BY RT-PCR (FLU A&B, COVID) ARPGX2
Influenza A by PCR: NEGATIVE
Influenza B by PCR: NEGATIVE
SARS Coronavirus 2 by RT PCR: NEGATIVE

## 2020-04-07 LAB — GLUCOSE, CAPILLARY: Glucose-Capillary: 259 mg/dL — ABNORMAL HIGH (ref 70–99)

## 2020-04-07 LAB — PROTIME-INR
INR: 1 (ref 0.8–1.2)
Prothrombin Time: 13 seconds (ref 11.4–15.2)

## 2020-04-07 LAB — LIPASE, BLOOD: Lipase: 23 U/L (ref 11–51)

## 2020-04-07 LAB — TROPONIN I (HIGH SENSITIVITY)
Troponin I (High Sensitivity): 37 ng/L — ABNORMAL HIGH (ref <18)
Troponin I (High Sensitivity): 42 ng/L — ABNORMAL HIGH (ref <18)
Troponin I (High Sensitivity): 44 ng/L — ABNORMAL HIGH (ref <18)

## 2020-04-07 LAB — APTT: aPTT: 39 seconds — ABNORMAL HIGH (ref 24–36)

## 2020-04-07 LAB — BRAIN NATRIURETIC PEPTIDE: B Natriuretic Peptide: 177.1 pg/mL — ABNORMAL HIGH (ref 0.0–100.0)

## 2020-04-07 LAB — CBG MONITORING, ED: Glucose-Capillary: 204 mg/dL — ABNORMAL HIGH (ref 70–99)

## 2020-04-07 LAB — TSH: TSH: 10.366 u[IU]/mL — ABNORMAL HIGH (ref 0.350–4.500)

## 2020-04-07 MED ORDER — METOPROLOL TARTRATE 5 MG/5ML IV SOLN
5.0000 mg | INTRAVENOUS | Status: AC | PRN
  Administered 2020-04-07 (×2): 5 mg via INTRAVENOUS
  Filled 2020-04-07 (×2): qty 5

## 2020-04-07 MED ORDER — METOPROLOL TARTRATE 50 MG PO TABS
50.0000 mg | ORAL_TABLET | Freq: Two times a day (BID) | ORAL | Status: DC
  Administered 2020-04-07 – 2020-04-08 (×2): 50 mg via ORAL
  Filled 2020-04-07 (×2): qty 1

## 2020-04-07 MED ORDER — HYDRALAZINE HCL 20 MG/ML IJ SOLN: 5.0000 mg | INTRAMUSCULAR | Status: DC | PRN

## 2020-04-07 MED ORDER — INSULIN ASPART 100 UNIT/ML ~~LOC~~ SOLN
0.0000 [IU] | Freq: Every day | SUBCUTANEOUS | Status: DC
  Administered 2020-04-07: 3 [IU] via SUBCUTANEOUS
  Filled 2020-04-07: qty 1

## 2020-04-07 MED ORDER — MORPHINE SULFATE (PF) 2 MG/ML IV SOLN
2.0000 mg | INTRAVENOUS | Status: DC | PRN
  Administered 2020-04-07: 2 mg via INTRAVENOUS
  Filled 2020-04-07 (×2): qty 1

## 2020-04-07 MED ORDER — NITROGLYCERIN 0.4 MG SL SUBL: 0.4000 mg | SUBLINGUAL_TABLET | SUBLINGUAL | Status: DC | PRN

## 2020-04-07 MED ORDER — LEVOTHYROXINE SODIUM 100 MCG PO TABS
200.0000 ug | ORAL_TABLET | Freq: Every day | ORAL | Status: DC
  Administered 2020-04-08: 200 ug via ORAL
  Filled 2020-04-07: qty 2

## 2020-04-07 MED ORDER — SODIUM CHLORIDE 0.9 % IV BOLUS
500.0000 mL | Freq: Once | INTRAVENOUS | Status: AC
  Administered 2020-04-07: 500 mL via INTRAVENOUS

## 2020-04-07 MED ORDER — PANTOPRAZOLE SODIUM 40 MG PO TBEC
40.0000 mg | DELAYED_RELEASE_TABLET | Freq: Every day | ORAL | Status: DC
  Administered 2020-04-07 – 2020-04-08 (×2): 40 mg via ORAL
  Filled 2020-04-07 (×2): qty 1

## 2020-04-07 MED ORDER — DILTIAZEM HCL-DEXTROSE 125-5 MG/125ML-% IV SOLN (PREMIX)
5.0000 mg/h | INTRAVENOUS | Status: DC
  Administered 2020-04-07: 7.5 mg/h via INTRAVENOUS
  Administered 2020-04-07 – 2020-04-08 (×2): 5 mg/h via INTRAVENOUS
  Filled 2020-04-07 (×2): qty 125

## 2020-04-07 MED ORDER — ATORVASTATIN CALCIUM 20 MG PO TABS: 40.0000 mg | ORAL_TABLET | Freq: Every day | ORAL | Status: DC

## 2020-04-07 MED ORDER — FUROSEMIDE 40 MG PO TABS
40.0000 mg | ORAL_TABLET | Freq: Every day | ORAL | Status: DC
  Administered 2020-04-07 – 2020-04-08 (×2): 40 mg via ORAL
  Filled 2020-04-07 (×2): qty 1

## 2020-04-07 MED ORDER — IOHEXOL 350 MG/ML SOLN
75.0000 mL | Freq: Once | INTRAVENOUS | Status: AC | PRN
  Administered 2020-04-07: 75 mL via INTRAVENOUS

## 2020-04-07 MED ORDER — HEPARIN BOLUS VIA INFUSION
1500.0000 [IU] | Freq: Once | INTRAVENOUS | Status: AC
  Administered 2020-04-07: 1500 [IU] via INTRAVENOUS
  Filled 2020-04-07: qty 1500

## 2020-04-07 MED ORDER — HEPARIN (PORCINE) 25000 UT/250ML-% IV SOLN
1900.0000 [IU]/h | INTRAVENOUS | Status: DC
  Administered 2020-04-07: 1400 [IU]/h via INTRAVENOUS
  Administered 2020-04-08: 1900 [IU]/h via INTRAVENOUS
  Filled 2020-04-07 (×3): qty 250

## 2020-04-07 MED ORDER — AMIODARONE HCL 200 MG PO TABS
200.0000 mg | ORAL_TABLET | Freq: Every day | ORAL | Status: DC
  Administered 2020-04-07: 200 mg via ORAL
  Filled 2020-04-07: qty 1

## 2020-04-07 MED ORDER — POTASSIUM CHLORIDE CRYS ER 20 MEQ PO TBCR
40.0000 meq | EXTENDED_RELEASE_TABLET | Freq: Once | ORAL | Status: AC
  Administered 2020-04-07: 40 meq via ORAL
  Filled 2020-04-07: qty 2

## 2020-04-07 MED ORDER — INSULIN ASPART 100 UNIT/ML ~~LOC~~ SOLN
0.0000 [IU] | Freq: Three times a day (TID) | SUBCUTANEOUS | Status: DC
  Administered 2020-04-07: 3 [IU] via SUBCUTANEOUS
  Administered 2020-04-08: 2 [IU] via SUBCUTANEOUS
  Filled 2020-04-07 (×2): qty 1

## 2020-04-07 MED ORDER — MORPHINE SULFATE (PF) 4 MG/ML IV SOLN
4.0000 mg | Freq: Once | INTRAVENOUS | Status: AC
  Administered 2020-04-07: 4 mg via INTRAVENOUS
  Filled 2020-04-07: qty 1

## 2020-04-07 MED ORDER — INFLUENZA VAC SPLIT QUAD 0.5 ML IM SUSY: 0.5000 mL | PREFILLED_SYRINGE | INTRAMUSCULAR | Status: DC | PRN

## 2020-04-07 NOTE — Consult Note (Signed)
ANTICOAGULATION CONSULT NOTE   Pharmacy Consult for Heparin Indication: chest pain/ACS and atrial fibrillation  No Known Allergies  Patient Measurements: Height: 5\' 10"  (177.8 cm) Weight: (!) 138.2 kg (304 lb 9.6 oz) IBW/kg (Calculated) : 73 Heparin Dosing Weight: 105.5 kg  Vital Signs: Temp: 98.7 F (37.1 C) (11/20 2030) Temp Source: Oral (11/20 2030) BP: 145/89 (11/20 2030) Pulse Rate: 83 (11/20 2030)  Labs: Recent Labs    04/07/20 0627 04/07/20 1259 04/07/20 2101  HGB 12.4*  --   --   HCT 37.6*  --   --   PLT 309  --   --   APTT  --  39*  --   LABPROT 13.0  --   --   INR 1.0  --   --   HEPARINUNFRC  --  0.12* 0.25*  CREATININE 1.00  --   --   TROPONINIHS 37* 42*  44*  --     Estimated Creatinine Clearance: 114.2 mL/min (by C-G formula based on SCr of 1 mg/dL).   Medical History: Past Medical History:  Diagnosis Date  . AF (paroxysmal atrial fibrillation) (HCC) 06/06/2018  . CAD (coronary artery disease) 05/13/2018  . Chronic diastolic CHF (congestive heart failure) (HCC) 11/17/2018  . Coronary artery disease   . Diabetes mellitus type II, non insulin dependent (HCC) 11/17/2018  . Hypothyroidism 11/17/2018    Medications:  Medications Prior to Admission  Medication Sig Dispense Refill Last Dose  . amiodarone (PACERONE) 200 MG tablet Take 1 tablet (200 mg total) by mouth daily. 30 tablet 0 04/06/2020 at Unknown time  . atorvastatin (LIPITOR) 40 MG tablet Take 1 tablet (40 mg total) by mouth daily at 6 PM. 30 tablet 0 04/06/2020 at Unknown time  . furosemide (LASIX) 40 MG tablet Take 1 tablet (40 mg total) by mouth daily. 30 tablet 0 04/06/2020 at Unknown time  . metFORMIN (GLUCOPHAGE) 500 MG tablet Take 2 tablets (1,000 mg total) by mouth 2 (two) times daily. 120 tablet 2 04/06/2020 at Unknown time  . metoprolol tartrate (LOPRESSOR) 50 MG tablet Take 1 tablet (50 mg total) by mouth 2 (two) times daily. 60 tablet 0 04/06/2020 at Unknown time  . omeprazole  (PRILOSEC) 20 MG capsule Take 2 capsules (40 mg total) by mouth daily. 30 capsule 0 04/06/2020 at Unknown time  . rivaroxaban (XARELTO) 20 MG TABS tablet Take 1 tablet (20 mg total) by mouth daily. 30 tablet 0 04/06/2020 at Unknown time  . SYNTHROID 200 MCG tablet Take 200 mcg by mouth daily.   04/06/2020 at Unknown time  . acetaminophen (TYLENOL) 500 MG tablet Take 500 mg by mouth every 6 (six) hours as needed for mild pain or fever.    unknown at prn  . dexamethasone (DECADRON) 6 MG tablet Take 1 tablet (6 mg total) by mouth daily. Please take for 7 day (Patient not taking: Reported on 04/07/2020) 7 tablet 0 Not Taking at Unknown time  . gabapentin (NEURONTIN) 300 MG capsule Take 1 capsule (300 mg total) by mouth 2 (two) times daily. 60 capsule 0   . glimepiride (AMARYL) 4 MG tablet Take 1 tablet (4 mg total) by mouth daily with breakfast. 30 tablet 0   . hydrALAZINE (APRESOLINE) 25 MG tablet Take 1 tablet (25 mg total) by mouth 3 (three) times daily. 90 tablet 0   . rivaroxaban (XARELTO) 20 MG TABS tablet Take 1 tablet by mouth daily.      Scheduled:  . amiodarone  200 mg Oral Daily  . [  START ON 04/08/2020] atorvastatin  40 mg Oral q1800  . furosemide  40 mg Oral Daily  . insulin aspart  0-5 Units Subcutaneous QHS  . insulin aspart  0-9 Units Subcutaneous TID WC  . [START ON 04/08/2020] levothyroxine  200 mcg Oral QAC breakfast  . metoprolol tartrate  50 mg Oral BID  . pantoprazole  40 mg Oral Daily   Infusions:  . diltiazem (CARDIZEM) infusion 7.5 mg/hr (04/07/20 2049)  . heparin 1,700 Units/hr (04/07/20 1441)   PRN:  Anti-infectives (From admission, onward)   None      Assessment: 59 y.o. 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 Pharmacy has been consulted for IV heparin dosing.    Normally, in cases where a patient takes a direct oral anticoagulant prior to admission, pharmacy will dose heparin infusion based on aPTT levels because heparin level (aka anti-Xa level) can be falsely elevated by DOACs. However, this patient's previous heparin level was not falsely elevated (that is, the heparin level correlated with the aPTT level as would be expected for someone NOT taking a DOAC prior to admission), which is unusual for someone who is reported to have last had a dose of rivaroxaban yesterday. Pharmacy will continue to dose heparin infusion based on heparin levels (rather than aPTTs) for this patient.  This evening, heparin level is subtherapeutic at 0.25. Hgb low normal at 12.4 and platelets WNL. No issues with the infusion or overt bleeding noted.  Goal of Therapy:  Heparin level 0.3-0.7 units/ml aPTT 66-102 seconds  Monitor platelets by anticoagulation protocol: Yes  Plan:  Heparin 1500 units IV x 1 Then increase heparin infusion to 1900 units/hr Obtain heparin level in 6 hours Monitor CBC, daily heparin level  Continue to monitor for signs/symptoms of bleeding F/u transition to oral anticoagulant   Harlow Mares, PharmD Clinical Pharmacist  04/07/2020   9:32 PM

## 2020-04-07 NOTE — ED Notes (Signed)
Pt requested pain medication for chest pain by the time this RN drew up medicine and was going to administer pt states "no more pain, it comes and goes" and states he does not want morphine, wasted with another RN in waste container by pyxis.

## 2020-04-07 NOTE — ED Notes (Signed)
Report to Taylor

## 2020-04-07 NOTE — Consult Note (Signed)
ANTICOAGULATION CONSULT NOTE   Pharmacy Consult for Heparin Indication: chest pain/ACS and atrial fibrillation  No Known Allergies  Patient Measurements: Height: 5\' 10"  (177.8 cm) Weight: (!) 138.8 kg (306 lb) IBW/kg (Calculated) : 73 Heparin Dosing Weight: 105.5 kg  Vital Signs: Temp Source: Oral (11/20 0613) BP: 118/72 (11/20 0730) Pulse Rate: 124 (11/20 0730)  Labs: Recent Labs    04/07/20 0627  HGB 12.4*  HCT 37.6*  PLT 309  LABPROT 13.0  INR 1.0  CREATININE 1.00  TROPONINIHS 37*    Estimated Creatinine Clearance: 114.5 mL/min (by C-G formula based on SCr of 1 mg/dL).   Medical History: Past Medical History:  Diagnosis Date  . AF (paroxysmal atrial fibrillation) (HCC) 06/06/2018  . CAD (coronary artery disease) 05/13/2018  . Chronic diastolic CHF (congestive heart failure) (HCC) 11/17/2018  . Coronary artery disease   . Diabetes mellitus type II, non insulin dependent (HCC) 11/17/2018  . Hypothyroidism 11/17/2018    Medications:  (Not in a hospital admission)  Scheduled:  .  morphine injection  4 mg Intravenous Once   Infusions:  . diltiazem (CARDIZEM) infusion     PRN:  Anti-infectives (From admission, onward)   None      Assessment: Pharmacy consulted to start heparin with no bolus. Takes rivaroxaban PTA.   Goal of Therapy:  aPTT 66-102 seconds and Heparin level of 0.3 -0.7 once aptt and heparin level correlate.  Monitor platelets by anticoagulation protocol: Yes   Plan:  Start heparin infusion at 1400 units/hr Check anti-Xa level in 6 hours and daily while on heparin Continue to monitor H&H and platelets  01/18/2019, PharmD, BCPS 04/07/2020,7:47 AM

## 2020-04-07 NOTE — H&P (Signed)
History and Physical    Craig Reese JHE:[id number] DOB: 1960/09/16 DOA: 04/07/2020  Referring MD/NP/PA:   PCP: [name]   Patient coming from:  The patient is coming from home.  At baseline, pt is independent for most of ADL.        Chief Complaint: Chest pain and palpitation  HPI: Craig Reese is a 59 y.o. 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  Patient does speaks [place].  History is obtained with iPad translator's help. Patient states that he has acute onset chest pain today.  Chest pain is located in substernal area, intermittent, moderate, dull, nonradiating. It is associated with shortness of breath.  No cough, fever or chills.  Patient has history of atrial fibrillation and is currently taking Xarelto.  Patient does not have nausea, vomiting, diarrhea, abdominal pain, symptoms of UTI or unilateral weakness.  Pt was found to be in A. fib with RVR with HR in 160s by EMS, so they gave him metoprolol 5 mg IV because he takes metoprolol at home.  This brought his rate down into the 130s.  He also received a full dose aspirin 324 mg by mouth.   ED Course: pt was found to have WBC 12.3, negative Covid PCR, INR 1.0, troponin level 37, BNP 177, lipase 23, potassium 3.4, renal function okay, liver function (ALP 91, AST 49, ALT 64, total bilirubin 0.5), blood pressure 118/72, heart rate 134, RR 30, oxygen saturation 94% on room air.  Chest x-ray showed cardiomegaly otherwise negative.  CT angiogram is negative for PE. Patient is placed on progressive benefit observation.  Dr. Gwen Pounds of card is consulted.  Review of Systems:   General: no fevers, chills, no body weight gain, has fatigue HEENT: no blurry vision, hearing  changes or sore throat Respiratory: has dyspnea, no coughing, wheezing CV: has chest pain, has palpitations GI: no nausea, vomiting, abdominal pain, diarrhea, constipation GU: no dysuria, burning on urination, increased urinary frequency, hematuria  Ext: no leg edema Neuro: no unilateral weakness, numbness, or tingling, no vision change or hearing loss Skin: no rash, no skin tear. MSK: No muscle spasm, no deformity, no limitation of range of movement in spin Heme: No easy bruising.  Travel history: No recent long distant travel.  Allergy: No Known Allergies  Past Medical History:  Diagnosis Date  . AF (paroxysmal atrial fibrillation) (HCC) 06/06/2018  . CAD (coronary artery disease) 05/13/2018  . Chronic diastolic CHF (congestive heart failure) (HCC) 11/17/2018  . Coronary artery disease   . Diabetes mellitus type II, non insulin dependent (HCC) 11/17/2018  . Hypothyroidism 11/17/2018    Past Surgical History:  Procedure Laterality Date  . CORONARY ARTERY BYPASS GRAFT    . LEFT HEART CATH AND CORS/GRAFTS ANGIOGRAPHY N/A 05/17/2018   Procedure: LEFT HEART CATH AND CORS/GRAFTS ANGIOGRAPHY and possible PCI and stent;  Surgeon: Alwyn Pea, MD;  Location: [facility] INVASIVE CV LAB;  Service: Cardiovascular;  Laterality: N/A;    Social History:  reports that he has never smoked. He has never used smokeless tobacco. He reports previous alcohol use. He reports that he does not use drugs.  Family History: Reviewed with patient, but patient states that all family members do not have significant medical issues per his knowledge.  Prior to Admission medications  Medication Sig Start Date End Date Taking? Authorizing Provider  acetaminophen (TYLENOL) 500 MG tablet Take 500 mg by mouth every 6 (six) hours as needed for mild pain or fever.     [provider]  amiodarone (PACERONE) 200 MG tablet Take 1 tablet (200 mg total) by mouth daily. 11/21/18   Elgergawy, Leana Roe, MD  atorvastatin  (LIPITOR) 40 MG tablet Take 1 tablet (40 mg total) by mouth daily at 6 PM. 11/21/18   Elgergawy, Leana Roe, MD  dexamethasone (DECADRON) 6 MG tablet Take 1 tablet (6 mg total) by mouth daily. Please take for 7 day 11/21/18   Elgergawy, Leana Roe, MD  furosemide (LASIX) 40 MG tablet Take 1 tablet (40 mg total) by mouth daily. 11/21/18   Elgergawy, Leana Roe, MD  gabapentin (NEURONTIN) 300 MG capsule Take 1 capsule (300 mg total) by mouth 2 (two) times daily. 11/21/18 02/06/20  Elgergawy, Leana Roe, MD  glimepiride (AMARYL) 4 MG tablet Take 1 tablet (4 mg total) by mouth daily with breakfast. 11/21/18   Elgergawy, Leana Roe, MD  hydrALAZINE (APRESOLINE) 25 MG tablet Take 1 tablet (25 mg total) by mouth 3 (three) times daily. 11/21/18   Elgergawy, Leana Roe, MD  levothyroxine (SYNTHROID) 150 MCG tablet Take 1 tablet (150 mcg total) by mouth daily. 11/21/18 05/29/20  Elgergawy, Leana Roe, MD  metFORMIN (GLUCOPHAGE) 500 MG tablet Take 2 tablets (1,000 mg total) by mouth 2 (two) times daily. 11/21/18 05/29/20  Elgergawy, Leana Roe, MD  metoprolol tartrate (LOPRESSOR) 50 MG tablet Take 1 tablet (50 mg total) by mouth 2 (two) times daily. 11/21/18   Elgergawy, Leana Roe, MD  omeprazole (PRILOSEC) 20 MG capsule Take 2 capsules (40 mg total) by mouth daily. 11/21/18   Elgergawy, Leana Roe, MD  rivaroxaban (XARELTO) 20 MG TABS tablet Take 1 tablet by mouth daily. 05/09/15 11/17/18  [provider]  rivaroxaban (XARELTO) 20 MG TABS tablet Take 1 tablet (20 mg total) by mouth daily. 11/21/18 12/21/18  Elgergawy, Leana Roe, MD    Physical Exam: Vitals:   04/07/20 0654 04/07/20 0656 04/07/20 0700 04/07/20 0730  BP:   114/84 118/72  Pulse: (!) 119 (!) 115 100 (!) 124  Resp: 14 14 18  (!) 24  TempSrc:      SpO2: 96% 96% 95% 96%  Weight:      Height:       General: Not in acute distress HEENT:       Eyes: PERRL, EOMI, no scleral icterus.       ENT: No discharge from the ears and nose, no pharynx injection, no tonsillar enlargement.         Neck: No JVD, no bruit, no mass felt. Heme: No neck lymph node enlargement. Cardiac: S1/S2, irregularly irregular rhythm, No murmurs, No gallops or rubs. Respiratory:  No rales, wheezing, rhonchi or rubs. GI: Soft, nondistended, nontender, no rebound pain, no organomegaly, BS present. GU: No hematuria Ext: No pitting leg edema bilaterally. 2+DP/PT pulse bilaterally. Musculoskeletal: No joint deformities, No joint redness or warmth, no limitation of ROM in spin. Skin: No rashes.  Neuro: Alert, oriented X3, cranial nerves II-XII grossly intact, moves all extremities normally. Psych: Patient is not psychotic, no suicidal or hemocidal ideation.  Labs on Admission: I have personally reviewed following labs and imaging studies  CBC: Recent Labs  Lab 04/07/20 0627  WBC 12.3*  NEUTROABS 8.2*  HGB 12.4*  HCT 37.6*  MCV 86.8  PLT 309   Basic Metabolic Panel: Recent Labs  Lab 04/07/20 0627  NA 138  K 3.4*  CL 101  CO2 25  GLUCOSE 235*  BUN 16  CREATININE 1.00  CALCIUM 8.8*   GFR: Estimated Creatinine Clearance: 114.5 mL/min (by C-G formula based on SCr of 1 mg/dL). Liver Function Tests: Recent Labs  Lab 04/07/20 0627  AST 49*  ALT 64*  ALKPHOS 91  BILITOT 0.5  PROT 8.2*  ALBUMIN 4.1   Recent Labs  Lab 04/07/20 0627  LIPASE 23   No results for input(s): AMMONIA in the last 168 hours. Coagulation Profile: Recent Labs  Lab 04/07/20 0627  INR 1.0   Cardiac Enzymes: No results for input(s): CKTOTAL, CKMB, CKMBINDEX, TROPONINI in the last 168 hours. BNP (last 3 results) No results for input(s): PROBNP in the last 8760 hours. HbA1C: No results for input(s): HGBA1C in the last 72 hours. CBG: No results for input(s): GLUCAP in the last 168 hours. Lipid Profile: No results for input(s): CHOL, HDL, LDLCALC, TRIG, CHOLHDL, LDLDIRECT in the last 72 hours. Thyroid Function Tests: No results for input(s): TSH, T4TOTAL, FREET4, T3FREE, THYROIDAB in the last 72  hours. Anemia Panel: No results for input(s): VITAMINB12, FOLATE, FERRITIN, TIBC, IRON, RETICCTPCT in the last 72 hours. Urine analysis:    Component Value Date/Time   COLORURINE Yellow 09/22/2013 2248   APPEARANCEUR Clear 09/22/2013 2248   LABSPEC >1.060 09/22/2013 2248   PHURINE 5.0 09/22/2013 2248   GLUCOSEU Negative 09/22/2013 2248   HGBUR 1+ 09/22/2013 2248   BILIRUBINUR Negative 09/22/2013 2248   KETONESUR Negative 09/22/2013 2248   PROTEINUR Negative 09/22/2013 2248   NITRITE Negative 09/22/2013 2248   LEUKOCYTESUR Negative 09/22/2013 2248   Sepsis Labs: @LABRCNTIP (procalcitonin:4,lacticidven:4) ) Recent Results (from the past 240 hour(s))  Resp Panel by RT-PCR (Flu A&B, Covid) Nasopharyngeal Swab     Status: None   Collection Time: 04/07/20  6:27 AM   Specimen: Nasopharyngeal Swab; Nasopharyngeal(NP) swabs in vial transport medium  Result Value Ref Range Status   SARS Coronavirus 2 by RT PCR NEGATIVE NEGATIVE Final    Comment: (NOTE) SARS-CoV-2 target nucleic acids are NOT DETECTED.  The SARS-CoV-2 RNA is generally detectable in upper respiratory specimens during the acute phase of infection. The lowest concentration of SARS-CoV-2 viral copies this assay can detect is 138 copies/mL. A negative result does not preclude SARS-Cov-2 infection and should not be used as the sole basis for treatment or other patient management decisions. A negative result may occur with  improper specimen collection/handling, submission of specimen other than nasopharyngeal swab, presence of viral mutation(s) within the areas targeted by this assay, and inadequate number of viral copies(<138 copies/mL). A negative result must be combined with clinical observations, patient history, and epidemiological information. The expected result is Negative.  Fact Sheet for Patients:  04/09/20  Fact Sheet for Healthcare Providers:   BloggerCourse.com  This test is no t yet approved or cleared by the [place] FDA and  has been authorized for detection and/or diagnosis of SARS-CoV-2 by FDA under an Emergency Use Authorization (EUA). This EUA will remain  in effect (meaning this test can be used) for the duration of the COVID-19 declaration under Section 564(b)(1) of the Act, 21 U.S.C.section 360bbb-3(b)(1), unless the authorization is terminated  or revoked sooner.       Influenza A by PCR NEGATIVE NEGATIVE Final   Influenza B by PCR NEGATIVE NEGATIVE Final    Comment: (NOTE) The Xpert Xpress SARS-CoV-2/FLU/RSV plus assay is intended as an aid in the diagnosis of  influenza from Nasopharyngeal swab specimens and should not be used as a sole basis for treatment. Nasal washings and aspirates are unacceptable for Xpert Xpress SARS-CoV-2/FLU/RSV testing.  Fact Sheet for Patients: BloggerCourse.com  Fact Sheet for Healthcare Providers: SeriousBroker.it  This test is not yet approved or cleared by the [place] FDA and has been authorized for detection and/or diagnosis of SARS-CoV-2 by FDA under an Emergency Use Authorization (EUA). This EUA will remain in effect (meaning this test can be used) for the duration of the COVID-19 declaration under Section 564(b)(1) of the Act, 21 U.S.C. section 360bbb-3(b)(1), unless the authorization is terminated or revoked.  Performed at [facility], [address] [place], [place] [address]      Radiological Exams on Admission: DG Chest Portable 1 View  Result Date: 04/07/2020 CLINICAL DATA:  Acute chest pain EXAM: PORTABLE CHEST 1 VIEW COMPARISON:  11/16/2018 FINDINGS: Cardiomegaly and CABG changes noted. There is no evidence of focal airspace disease, pulmonary edema, suspicious pulmonary nodule/mass, pleural effusion, or pneumothorax. No acute bony abnormalities are identified.  IMPRESSION: Cardiomegaly without evidence of acute cardiopulmonary disease. Electronically Signed   By: Harmon Pier M.D.   On: 04/07/2020 08:05     EKG:   Not done in ED, will get one.   Assessment/Plan Principal Problem:   Chest pain Active Problems:   CAD (coronary artery disease)   Atrial fibrillation with RVR (HCC)   Diabetes mellitus type II, non insulin dependent (HCC)   Hypothyroidism   Hypokalemia   Chronic diastolic CHF (congestive heart failure) (HCC)   HLD (hyperlipidemia)   Elevated troponin   Abnormal LFTs   Leukocytosis   HTN (hypertension)   Chest pain, elevated trop and hx of CAD: s/p of CABG. Trop 37.  CT angiogram is negative for PE.  Possibly due to demand ischemia secondary to atrial fibrillation with RVR.  EKG not done yet, will get one stat.  Dr. Gwen Pounds of cardiology is consulted.  - place to progressive unit for observation - IV heparin is started in ED -->will continue now - Trend Trop - Repeat EKG in the am  - prn Nitroglycerin, Morphine, lipitor  - Risk factor stratification: will check FLP and A1C  - check UDS - 2d echo  Atrial fibrillation with RVR: Patient is taking Xarelto, last dose was last night. -on IV heparin currently -continue Cardizem gtt now -Continue home amiodarone, metoprolol -Check TSH  Hypothyroidism -Synthroid -f/u TSH  Hypokalemia: K= 3.4 on admission. - Repleted - Check Mg level  Chronic diastolic CHF (congestive heart failure) (HCC): 2D echo 10/28/2018 showed EF of 55-8%.  Patient does not have leg edema.  No pulmonary edema chest x-ray.  BNP 177, CHF seem to be compensated. -continue home lasix  HLD (hyperlipidemia) -Lipitor  Abnormal LFTs: -check hepatitis panel, HIV antibody -Avoid using Tylenol  HTN: -IV hydralazine as needed -metoprolol -Patient is also on Lasix  Diabetes mellitus type II, non insulin dependent: Recent A1c 6.9, well controlled.  Patient is taking Metformin and  Amaryl -SSI  Leukocytosis: WBC 12.3.  No source of infection identified, likely reactive. -Follow-up of CBC      DVT ppx: on IV Heparin   Code Status: Full code Family Communication:    Yes, patient's Niece by phone, who speaks some [place]. Disposition Plan:  Anticipate discharge back to previous environment Consults called:  Dr. Gwen Pounds of cardiology Admission status:   progressive unit for obs   Status is: Observation  The patient remains OBS appropriate  and will d/c before 2 midnights.  Dispo: The patient is from: Home              Anticipated d/c is to: Home              Anticipated d/c date is: 1 day              Patient currently is not medically stable to d/c.          Date of Service 04/07/2020    Lorretta HarpXilin Chantalle Defilippo [facility]   If 7PM-7AM, please contact night-coverage [email or web address] 04/07/2020, 8:50 AM

## 2020-04-07 NOTE — Consult Note (Addendum)
ANTICOAGULATION CONSULT NOTE   Pharmacy Consult for Heparin Indication: chest pain/ACS and atrial fibrillation  No Known Allergies  Patient Measurements: Height: 5\' 10"  (177.8 cm) Weight: (!) 138.8 kg (306 lb) IBW/kg (Calculated) : 73 Heparin Dosing Weight: 105.5 kg  Vital Signs: Temp Source: Oral (11/20 0613) BP: 115/70 (11/20 1330) Pulse Rate: 86 (11/20 1330)  Labs: Recent Labs    04/07/20 0627 04/07/20 1259  HGB 12.4*  --   HCT 37.6*  --   PLT 309  --   APTT  --  39*  LABPROT 13.0  --   INR 1.0  --   HEPARINUNFRC  --  0.12*  CREATININE 1.00  --   TROPONINIHS 37* 42*  44*    Estimated Creatinine Clearance: 114.5 mL/min (by C-G formula based on SCr of 1 mg/dL).   Medical History: Past Medical History:  Diagnosis Date  . AF (paroxysmal atrial fibrillation) (HCC) 06/06/2018  . CAD (coronary artery disease) 05/13/2018  . Chronic diastolic CHF (congestive heart failure) (HCC) 11/17/2018  . Coronary artery disease   . Diabetes mellitus type II, non insulin dependent (HCC) 11/17/2018  . Hypothyroidism 11/17/2018    Medications:  (Not in a hospital admission)  Scheduled:  . insulin aspart  0-5 Units Subcutaneous QHS  . insulin aspart  0-9 Units Subcutaneous TID WC   Infusions:  . diltiazem (CARDIZEM) infusion 10 mg/hr (04/07/20 1125)  . heparin 1,400 Units/hr (04/07/20 0837)   PRN:  Anti-infectives (From admission, onward)   None      Assessment: Pharmacy consulted to start heparin with no bolus per MD order. Takes rivaroxaban PTA.  Heparin infusion started at 0837 on 11/20 11/20 1259 APTT  39 Heparin level 0.12 - Heparin level does not seem to be falsely elevated. Will use heparin level to monitor heparin.   Goal of Therapy:  aPTT 66-102 seconds and Heparin level of 0.3 -0.7 once aptt and heparin level correlate.  Monitor platelets by anticoagulation protocol: Yes   Plan:  Heparin level and aPTT is subtherapeutic. Increase heparin infusion to 1700  units/hr Check anti-Xa level in 6 hours and CBC daily while on heparin.   12/20, PharmD, BCPS 04/07/2020,2:13 PM

## 2020-04-07 NOTE — ED Notes (Signed)
2A called this RN to state they needed a little more time before taking report. Pt is in no distress at this time. IV drips infusing at this time

## 2020-04-07 NOTE — Progress Notes (Signed)
Patient arrived to unit. iPad interpreter used for admission. No acute pain at this time. Heparin and cardizem infusing upon arrival. Fall/safety plan reviewed. Telemetry verified with CCMD. Skin assessment with Darien Ramus RN. Full assessment to follow in [facility].

## 2020-04-07 NOTE — Progress Notes (Signed)
*  PRELIMINARY RESULTS* Echocardiogram 2D Echocardiogram has been performed.  Cristela Blue 04/07/2020, 2:05 PM

## 2020-04-07 NOTE — ED Notes (Addendum)
Cardizem paused, BP 90/56, hospitalist aware

## 2020-04-07 NOTE — ED Notes (Signed)
Pt at CT

## 2020-04-07 NOTE — ED Notes (Signed)
Pt resting quietly at this time. Pt denies needs at this time. No distress noted. Call bell in reach.

## 2020-04-07 NOTE — ED Triage Notes (Signed)
Chest pain onset 0200 with palpitations which resolved on own. Pain and palpitation returned and pt called EMS. On EMS arrival pt noted to have HR 160 in afib with sharp pain and pressure. 5 mg metoprolol and 324 ASA admin. Pt with hx of afib and MI. Pt reports pain persists but has decreased. Pt alert and oriented following commands appropriately. Reports associated SOB at time of onset.MD at bedside evaluating patient

## 2020-04-07 NOTE — ED Provider Notes (Addendum)
[facility] Emergency Department Provider Note  ____________________________________________   First MD Initiated Contact with Patient 04/07/20 [phone]     (approximate)  I have reviewed the triage vital signs and the nursing notes.   HISTORY  Chief Complaint Chest Pain and Atrial Fibrillation  The patient speaks Spanish and requires the use of a hospital interpreter.  With his permission I also spoke with him directly in [place].  HPI Craig Reese is a 59 y.o. male with extensive heart disease history including coronary artery disease, prior heart attack, chronic diastolic CHF, and paroxysmal atrial fibrillation.  He says that he takes Xarelto for anticoagulation.  He presents by EMS for evaluation of acute onset and severe central sharp stabbing chest pain associated with shortness of breath.  He said this is happened to him multiple times in the past.  It occurred at rest prior to arrival and continues upon arrival to the hospital.  Nothing particular makes it better or worse.  He was found to be in A. fib with a heart rate in the 160s by EMS so they gave him metoprolol 5 mg IV because he takes metoprolol at home.  This brought his rate down into the 130s.   He also received a full dose aspirin by mouth.  He did not receive analgesia.  He denies fever/chills, sore throat, nausea, vomiting, and abdominal pain.  He said that the pain feels worse than it has recently.        Past Medical History:  Diagnosis Date  . AF (paroxysmal atrial fibrillation) (HCC) 06/06/2018  . CAD (coronary artery disease) 05/13/2018  . Chronic diastolic CHF (congestive heart failure) (HCC) 11/17/2018  . Coronary artery disease   . Diabetes mellitus type II, non insulin dependent (HCC) 11/17/2018  . Hypothyroidism 11/17/2018    Patient Active Problem List   Diagnosis Date Noted  . Chest pain 11/17/2018  . Diabetes mellitus type II, non insulin dependent (HCC) 11/17/2018  .  Hypothyroidism 11/17/2018  . Hypokalemia 11/17/2018  . Acute respiratory failure with hypoxia (HCC) 11/17/2018  . COVID-19 virus infection 11/17/2018  . Hypertensive urgency 11/17/2018  . Chronic diastolic CHF (congestive heart failure) (HCC) 11/17/2018  . Prolonged QT interval 11/17/2018  . AF (paroxysmal atrial fibrillation) (HCC) 06/06/2018  . CAD (coronary artery disease) 05/13/2018    Past Surgical History:  Procedure Laterality Date  . CORONARY ARTERY BYPASS GRAFT    . LEFT HEART CATH AND CORS/GRAFTS ANGIOGRAPHY N/A 05/17/2018   Procedure: LEFT HEART CATH AND CORS/GRAFTS ANGIOGRAPHY and possible PCI and stent;  Surgeon: Alwyn Peaallwood, Dwayne D, MD;  Location: [facility] INVASIVE CV LAB;  Service: Cardiovascular;  Laterality: N/A;    Prior to Admission medications   Medication Sig Start Date End Date Taking? Authorizing Provider  acetaminophen (TYLENOL) 500 MG tablet Take 500 mg by mouth every 6 (six) hours as needed for mild pain or fever.     [provider]  amiodarone (PACERONE) 200 MG tablet Take 1 tablet (200 mg total) by mouth daily. 11/21/18   Elgergawy, Leana Roeawood S, MD  atorvastatin (LIPITOR) 40 MG tablet Take 1 tablet (40 mg total) by mouth daily at 6 PM. 11/21/18   Elgergawy, Leana Roeawood S, MD  dexamethasone (DECADRON) 6 MG tablet Take 1 tablet (6 mg total) by mouth daily. Please take for 7 day 11/21/18   Elgergawy, Leana Roeawood S, MD  furosemide (LASIX) 40 MG tablet Take 1 tablet (40 mg total) by mouth daily. 11/21/18   Elgergawy, Leana Roeawood S, MD  gabapentin (NEURONTIN) 300 MG capsule Take 1 capsule (300 mg total) by mouth 2 (two) times daily. 11/21/18 02/06/20  Elgergawy, Leana Roe, MD  glimepiride (AMARYL) 4 MG tablet Take 1 tablet (4 mg total) by mouth daily with breakfast. 11/21/18   Elgergawy, Leana Roe, MD  hydrALAZINE (APRESOLINE) 25 MG tablet Take 1 tablet (25 mg total) by mouth 3 (three) times daily. 11/21/18   Elgergawy, Leana Roe, MD  levothyroxine (SYNTHROID) 150 MCG tablet Take 1 tablet (150 mcg  total) by mouth daily. 11/21/18 05/29/20  Elgergawy, Leana Roe, MD  metFORMIN (GLUCOPHAGE) 500 MG tablet Take 2 tablets (1,000 mg total) by mouth 2 (two) times daily. 11/21/18 05/29/20  Elgergawy, Leana Roe, MD  metoprolol tartrate (LOPRESSOR) 50 MG tablet Take 1 tablet (50 mg total) by mouth 2 (two) times daily. 11/21/18   Elgergawy, Leana Roe, MD  omeprazole (PRILOSEC) 20 MG capsule Take 2 capsules (40 mg total) by mouth daily. 11/21/18   Elgergawy, Leana Roe, MD  rivaroxaban (XARELTO) 20 MG TABS tablet Take 1 tablet by mouth daily. 05/09/15 11/17/18  [provider]  rivaroxaban (XARELTO) 20 MG TABS tablet Take 1 tablet (20 mg total) by mouth daily. 11/21/18 12/21/18  Elgergawy, Leana Roe, MD    Allergies Patient has no known allergies.  History reviewed. No pertinent family history.  Social History Social History   Tobacco Use  . Smoking status: Never Smoker  . Smokeless tobacco: Never Used  Vaping Use  . Vaping Use: Never used  Substance Use Topics  . Alcohol use: Not Currently  . Drug use: Not on file    Review of Systems Constitutional: No fever/chills Eyes: No visual changes. ENT: No sore throat. Cardiovascular: +chest pain. Respiratory: +shortness of breath. Gastrointestinal: No abdominal pain.  No nausea, no vomiting.  No diarrhea.  No constipation. Genitourinary: Negative for dysuria. Musculoskeletal: Negative for neck pain.  Negative for back pain. Integumentary: Negative for rash. Neurological: Negative for headaches, focal weakness or numbness.   ____________________________________________   PHYSICAL EXAM:  VITAL SIGNS: ED Triage Vitals  Enc Vitals Group     BP 04/07/20 0613 (!) 138/97     Pulse Rate 04/07/20 0613 (!) 134     Resp 04/07/20 0613 20     Temp --      Temp Source 04/07/20 0613 Oral     SpO2 04/07/20 0613 100 %     Weight 04/07/20 0614 (!) 138.8 kg (306 lb)     Height 04/07/20 0614 1.778 m (5\' 10" )     Head Circumference --      Peak Flow --       Pain Score --      Pain Loc --      Pain Edu? --      Excl. in GC? --     Constitutional: Alert and oriented.  Eyes: Conjunctivae are normal.  Head: Atraumatic. Nose: No congestion/rhinnorhea. Mouth/Throat: Patient is wearing a mask. Neck: No stridor.  No meningeal signs.   Cardiovascular: Rapid rate with irregularly irregular rhythm. Good peripheral circulation. Grossly normal heart sounds. Respiratory: Normal respiratory effort.  No retractions. Gastrointestinal: Soft and nontender. No distention.  Musculoskeletal: No lower extremity tenderness nor edema. No gross deformities of extremities. Neurologic:  Normal speech and language. No gross focal neurologic deficits are appreciated.  Skin:  Skin is warm, dry and intact. Psychiatric: Mood and affect are normal. Speech and behavior are normal.  ____________________________________________   LABS (all labs ordered are listed, but only abnormal  results are displayed)  Labs Reviewed  COMPREHENSIVE METABOLIC PANEL - Abnormal; Notable for the following components:      Result Value   Potassium 3.4 (*)    Glucose, Bld 235 (*)    Calcium 8.8 (*)    Total Protein 8.2 (*)    AST 49 (*)    ALT 64 (*)    All other components within normal limits  BRAIN NATRIURETIC PEPTIDE - Abnormal; Notable for the following components:   B Natriuretic Peptide 177.1 (*)    All other components within normal limits  CBC WITH DIFFERENTIAL/PLATELET - Abnormal; Notable for the following components:   WBC 12.3 (*)    Hemoglobin 12.4 (*)    HCT 37.6 (*)    Neutro Abs 8.2 (*)    Eosinophils Absolute 1.2 (*)    Basophils Absolute 0.2 (*)    Abs Immature Granulocytes 0.08 (*)    All other components within normal limits  TROPONIN I (HIGH SENSITIVITY) - Abnormal; Notable for the following components:   Troponin I (High Sensitivity) 37 (*)    All other components within normal limits  RESP PANEL BY RT-PCR (FLU A&B, COVID) ARPGX2  LIPASE, BLOOD    PROTIME-INR  APTT  HEPARIN LEVEL (UNFRACTIONATED)  APTT  TROPONIN I (HIGH SENSITIVITY)   ____________________________________________  EKG  ED ECG REPORT I, Loleta Rose, the attending physician, personally viewed and interpreted this ECG.  Date: 04/07/2020 EKG Time: 6:21 AM Rate: 128 Rhythm: A. fib with RVR QRS Axis: normal Intervals: Right bundle branch block and left anterior fascicular block ST/T Wave abnormalities: Non-specific ST segment / T-wave changes, but no clear evidence of acute ischemia. Narrative Interpretation: no definitive evidence of acute ischemia; does not meet STEMI criteria.   ____________________________________________  RADIOLOGY I, Loleta Rose, personally viewed and evaluated these images (plain radiographs) as part of my medical decision making, as well as reviewing the written report by the radiologist.  ED MD interpretation: CTA chest pending at time of admission.  Official radiology report(s): No results found.  ____________________________________________   PROCEDURES   Procedure(s) performed (including Critical Care):  .Critical Care Performed by: Loleta Rose, MD Authorized by: Loleta Rose, MD   Critical care provider statement:    Critical care time (minutes):  30   Critical care time was exclusive of:  Separately billable procedures and treating other patients   Critical care was necessary to treat or prevent imminent or life-threatening deterioration of the following conditions:  Cardiac failure   Critical care was time spent personally by me on the following activities:  Development of treatment plan with patient or surrogate, discussions with consultants, evaluation of patient's response to treatment, examination of patient, obtaining history from patient or surrogate, ordering and performing treatments and interventions, ordering and review of laboratory studies, ordering and review of radiographic studies, pulse oximetry,  re-evaluation of patient's condition and review of old charts .1-3 Lead EKG Interpretation Performed by: Loleta Rose, MD Authorized by: Loleta Rose, MD     Interpretation: abnormal     ECG rate:  130   ECG rate assessment: tachycardic     Rhythm: atrial fibrillation     Ectopy: none     Conduction: normal       ____________________________________________   INITIAL IMPRESSION / MDM / ASSESSMENT AND PLAN / ED COURSE  As part of my medical decision making, I reviewed the following data within the electronic MEDICAL RECORD NUMBER Nursing notes reviewed and incorporated, Labs reviewed , EKG interpreted ,  Old EKG reviewed, Old chart reviewed, discussed with hospitalist (Dr. Clyde Lundborg) and reviewed Notes from prior ED visits   Differential diagnosis includes, but is not limited to, ACS, A. fib with RVR, PE, pneumonia.  Patient has no infectious signs or symptoms.  He said the symptoms feel very similar to his prior episodes.  His rate continues to be elevated though improved from prior.  I will give 2 additional doses of metoprolol 5 mg IV to see if we can control his rate and if this improves his pain.  He is getting a small fluid bolus that was started by EMS.  Blood pressure is appropriate.  The patient is on the cardiac monitor to evaluate for evidence of arrhythmia and/or significant heart rate changes.       Clinical Course as of Apr 07 758  Sat Apr 07, 2020  4081 I clarified with the patient that he is definitely still taking Xarelto.  He is still feeling ongoing central chest pain that occasionally gets worse and makes him short of breath.  His heart rate continues to vacillate between approximately 100-130 bpm.  His blood pressures, slightly but is still relatively low at around 105 systolic.  I am starting him on a diltiazem infusion with parameters to bring his heart rate down but I am reluctant to give him a bolus given that his blood pressure is already soft.The patient told me  that he was diagnosed in the past with blood clots in his lungs and that was when he was started on Xarelto.  Given his ongoing episodic chest pain that is worse tonight and his A. fib that is now not well controlled in terms of the rate, I am going to proceed with a CTA chest to rule out acute pulmonary embolism.  However the patient will need to stay in the hospital regardless for his A. fib with RVR and chest pain obs.  I have placed a consult to the hospitalist service for admission.   [CF]  0645 Mild elevation of troponin at 37.  Slight elevation of BNP at 177.  Comprehensive metabolic panel is generally reassuring with normal renal function and slight elevation of AST and ALT which is nonspecific in this context.  His CBC is notable for mild leukocytosis 2.3 but otherwise normal.  Coagulation studies are normal and lipase is normal.   [CF]  0739 Also ordering heparin infusion without bolus since he says he is compliant with his Xarelto.   [CF]  0747 SARS Coronavirus 2 by RT PCR: NEGATIVE [CF]  0750 Transferring ED care to Dr. Marisa Severin to discuss the case with the hospitalist.   [CF]  [phone] Discussed case with Dr. Clyde Lundborg by phone.  He will admit the patient.  He knows about the pending CTA chest.   [CF]    Clinical Course User Index [CF] Loleta Rose, MD     ____________________________________________  FINAL CLINICAL IMPRESSION(S) / ED DIAGNOSES  Final diagnoses:  Atrial fibrillation with RVR (HCC)  Unstable angina (HCC)  Elevated troponin level  Current use of long term anticoagulation  Acute dyspnea  Prolonged Q-T interval on ECG     MEDICATIONS GIVEN DURING THIS VISIT:  Medications  diltiazem (CARDIZEM) 125 mg in dextrose 5% 125 mL (1 mg/mL) infusion (has no administration in time range)  morphine 4 MG/ML injection 4 mg (has no administration in time range)  heparin ADULT infusion 100 units/mL (25000 units/27mL sodium chloride 0.45%) (has no administration in time range)    sodium  chloride 0.9 % bolus 500 mL (500 mLs Intravenous New Bag/Given 04/07/20 0625)  metoprolol tartrate (LOPRESSOR) injection 5 mg (5 mg Intravenous Given 04/07/20 0631)     ED Discharge Orders    None      *Please note:  Craig Reese was evaluated in Emergency Department on 04/07/2020 for the symptoms described in the history of present illness. He was evaluated in the context of the global COVID-19 pandemic, which necessitated consideration that the patient might be at risk for infection with the SARS-CoV-2 virus that causes COVID-19. Institutional protocols and algorithms that pertain to the evaluation of patients at risk for COVID-19 are in a state of rapid change based on information released by regulatory bodies including the CDC and federal and state organizations. These policies and algorithms were followed during the patient's care in the ED.  Some ED evaluations and interventions may be delayed as a result of limited staffing during and after the pandemic.*  Note:  This document was prepared using Dragon voice recognition software and may include unintentional dictation errors.   Loleta Rose, MD 04/07/20 0539    Loleta Rose, MD 04/07/20 [phone]

## 2020-04-08 DIAGNOSIS — I480 Paroxysmal atrial fibrillation: Secondary | ICD-10-CM

## 2020-04-08 DIAGNOSIS — E1169 Type 2 diabetes mellitus with other specified complication: Secondary | ICD-10-CM

## 2020-04-08 DIAGNOSIS — D6869 Other thrombophilia: Secondary | ICD-10-CM

## 2020-04-08 LAB — LIPID PANEL
Cholesterol: 188 mg/dL (ref 0–200)
HDL: 24 mg/dL — ABNORMAL LOW (ref >40)
LDL Cholesterol: 115 mg/dL — ABNORMAL HIGH (ref 0–99)
Total CHOL/HDL Ratio: 7.8 RATIO
Triglycerides: 246 mg/dL — ABNORMAL HIGH (ref <150)
VLDL: 49 mg/dL — ABNORMAL HIGH (ref 0–40)

## 2020-04-08 LAB — ECHOCARDIOGRAM COMPLETE
AR max vel: 2.3 cm2
AV Area VTI: 2.71 cm2
AV Area mean vel: 2.23 cm2
AV Mean grad: 2 mmHg
AV Peak grad: 3.2 mmHg
Ao pk vel: 0.89 m/s
Area-P 1/2: 3.48 cm2
Height: 70 in
S' Lateral: 3.28 cm
Weight: 4896 oz

## 2020-04-08 LAB — GLUCOSE, CAPILLARY: Glucose-Capillary: 199 mg/dL — ABNORMAL HIGH (ref 70–99)

## 2020-04-08 LAB — CBC
HCT: 34.3 % — ABNORMAL LOW (ref 39.0–52.0)
Hemoglobin: 11.2 g/dL — ABNORMAL LOW (ref 13.0–17.0)
MCH: 28.7 pg (ref 26.0–34.0)
MCHC: 32.7 g/dL (ref 30.0–36.0)
MCV: 87.9 fL (ref 80.0–100.0)
Platelets: 277 10*3/uL (ref 150–400)
RBC: 3.9 MIL/uL — ABNORMAL LOW (ref 4.22–5.81)
RDW: 14.6 % (ref 11.5–15.5)
WBC: 10.9 10*3/uL — ABNORMAL HIGH (ref 4.0–10.5)
nRBC: 0 % (ref 0.0–0.2)

## 2020-04-08 LAB — HEPARIN LEVEL (UNFRACTIONATED): Heparin Unfractionated: 0.37 IU/mL (ref 0.30–0.70)

## 2020-04-08 MED ORDER — AMIODARONE HCL 200 MG PO TABS
200.0000 mg | ORAL_TABLET | Freq: Two times a day (BID) | ORAL | Status: DC
  Administered 2020-04-08: 200 mg via ORAL
  Filled 2020-04-08: qty 1

## 2020-04-08 MED ORDER — KETOROLAC TROMETHAMINE 15 MG/ML IJ SOLN
15.0000 mg | Freq: Once | INTRAMUSCULAR | Status: AC
  Administered 2020-04-08: 15 mg via INTRAVENOUS
  Filled 2020-04-08: qty 1

## 2020-04-08 MED ORDER — RIVAROXABAN 20 MG PO TABS
20.0000 mg | ORAL_TABLET | Freq: Every day | ORAL | Status: DC
  Administered 2020-04-08: 20 mg via ORAL
  Filled 2020-04-08: qty 1

## 2020-04-08 MED ORDER — RIVAROXABAN 20 MG PO TABS: 20.0000 mg | ORAL_TABLET | Freq: Every day | ORAL | 0 refills | Status: DC

## 2020-04-08 MED ORDER — AMIODARONE HCL 200 MG PO TABS: ORAL_TABLET | ORAL | 0 refills | Status: DC

## 2020-04-08 NOTE — Progress Notes (Signed)
Interpreter Cammy Copa [phone] helped introduced this Rn. Asked about pain and assessment questions.

## 2020-04-08 NOTE — Discharge Summary (Signed)
[facility] -  at [facility]   PATIENT NAME: Craig Reese    MR#:  [id number]  DATE OF BIRTH:  1961/01/27  DATE OF ADMISSION:  04/07/2020 ADMITTING PHYSICIAN: Lorretta Harp, MD  DATE OF DISCHARGE: 04/08/2020 12:32 PM  PRIMARY CARE PHYSICIAN: [name]    ADMISSION DIAGNOSIS:  Unstable angina (HCC) [I20.0] Prolonged Q-T interval on ECG [R94.31] Elevated troponin level [R77.8] Atrial fibrillation with RVR (HCC) [I48.91] Current use of long term anticoagulation [Z79.01] Chest pain [R07.9] Acute dyspnea [R06.00]  DISCHARGE DIAGNOSIS:  Principal Problem:   Chest pain Active Problems:   CAD (coronary artery disease)   Atrial fibrillation with RVR (HCC)   Diabetes mellitus type II, non insulin dependent (HCC)   Hypothyroidism   Hypokalemia   Chronic diastolic CHF (congestive heart failure) (HCC)   HLD (hyperlipidemia)   Elevated troponin   Abnormal LFTs   Leukocytosis   HTN (hypertension)   SECONDARY DIAGNOSIS:   Past Medical History:  Diagnosis Date  . AF (paroxysmal atrial fibrillation) (HCC) 06/06/2018  . CAD (coronary artery disease) 05/13/2018  . Chronic diastolic CHF (congestive heart failure) (HCC) 11/17/2018  . Coronary artery disease   . Diabetes mellitus type II, non insulin dependent (HCC) 11/17/2018  . Hypothyroidism 11/17/2018    HOSPITAL COURSE:   1.  Chest pain, borderline elevated and flat troponin.  Seen by cardiology.  No further work-up.  Chest pain reproducible nature give 1 dose of Toradol. 2.  Atrial fibrillation with rapid ventricular response which converted to normal sinus rhythm.  Paroxysmal atrial fibrillation.  Patient was increased on dose of amiodarone 200 mg twice a day.  I will keep that going for 1 week and then change back to 200 mg daily after that.  On Xarelto for anticoagulation.  30-day free card and a new prescription for Xarelto sent into [facility].  If he cannot fill his prescription at  [facility] will have to go back to the trouble stroke clinic. 3.  Chronic diastolic congestive heart failure continue Lasix, metoprolol 4.  Hypothyroidism unspecified on Synthroid 5.  Type 2 diabetes mellitus with hyperlipidemia patient on atorvastatin and Metformin. 6.  Acquired thrombophilia.  Stroke risk higher with atrial fibrillation but reduced with taking Xarelto.  DISCHARGE CONDITIONS:   Satisfactory  CONSULTS OBTAINED:  Cardiology  DRUG ALLERGIES:  No Known Allergies  DISCHARGE MEDICATIONS:   Allergies as of 04/08/2020   No Known Allergies     Medication List    STOP taking these medications   dexamethasone 6 MG tablet Commonly known as: DECADRON   gabapentin 300 MG capsule Commonly known as: NEURONTIN   glimepiride 4 MG tablet Commonly known as: AMARYL   hydrALAZINE 25 MG tablet Commonly known as: APRESOLINE     TAKE these medications   acetaminophen 500 MG tablet Commonly known as: TYLENOL Take 500 mg by mouth every 6 (six) hours as needed for mild pain or fever.   amiodarone 200 MG tablet Commonly known as: PACERONE One tab po twice a day for one week then one tablet daily afterwards What changed:   how much to take  how to take this  when to take this  additional instructions   atorvastatin 40 MG tablet Commonly known as: LIPITOR Take 1 tablet (40 mg total) by mouth daily at 6 PM.   furosemide 40 MG tablet Commonly known as: LASIX Take 1 tablet (40 mg total) by mouth daily.   metFORMIN 500 MG tablet Commonly known as: GLUCOPHAGE  Take 2 tablets (1,000 mg total) by mouth 2 (two) times daily.   metoprolol tartrate 50 MG tablet Commonly known as: LOPRESSOR Take 1 tablet (50 mg total) by mouth 2 (two) times daily.   omeprazole 20 MG capsule Commonly known as: PRILOSEC Take 2 capsules (40 mg total) by mouth daily.   rivaroxaban 20 MG Tabs tablet Commonly known as: XARELTO Take 1 tablet (20 mg total) by mouth daily.   Synthroid 200  MCG tablet Generic drug: levothyroxine Take 200 mcg by mouth daily.        DISCHARGE INSTRUCTIONS:   Follow-up PMD 5 days Follow-up your cardiologist as scheduled  If you experience worsening of your admission symptoms, develop shortness of breath, life threatening emergency, suicidal or homicidal thoughts you must seek medical attention immediately by calling 911 or calling your MD immediately  if symptoms less severe.  You Must read complete instructions/literature along with all the possible adverse reactions/side effects for all the Medicines you take and that have been prescribed to you. Take any new Medicines after you have completely understood and accept all the possible adverse reactions/side effects.   Please note  You were cared for by a hospitalist during your hospital stay. If you have any questions about your discharge medications or the care you received while you were in the hospital after you are discharged, you can call the unit and asked to speak with the hospitalist on call if the hospitalist that took care of you is not available. Once you are discharged, your primary care physician will handle any further medical issues. Please note that NO REFILLS for any discharge medications will be authorized once you are discharged, as it is imperative that you return to your primary care physician (or establish a relationship with a primary care physician if you do not have one) for your aftercare needs so that they can reassess your need for medications and monitor your lab values.    Today   CHIEF COMPLAINT:   Chief Complaint  Patient presents with  . Chest Pain  . Atrial Fibrillation    HISTORY OF PRESENT ILLNESS:  Craig Reese  is a 59 y.o. male came in with chest pain and found to be in rapid atrial fibrillation   VITAL SIGNS:  Blood pressure (!) 154/85, pulse 76, temperature 97.9 F (36.6 C), temperature source Oral, resp. rate (!) 25, height  (1.778 m),  weight (!) 138.2 kg, SpO2 96 %.  I/O:    Intake/Output Summary (Last 24 hours) at 04/08/2020 1357 Last data filed at 04/08/2020 1100 Gross per 24 hour  Intake 366.58 ml  Output 1925 ml  Net -1558.42 ml    PHYSICAL EXAMINATION:  GENERAL:  59 y.o.-year-old patient lying in the bed with no acute distress.  EYES: Pupils equal, round, reactive to light and accommodation. No scleral icterus. Extraocular muscles intact.  HEENT: Head atraumatic, normocephalic. Oropharynx and nasopharynx clear.  LUNGS: Normal breath sounds bilaterally, no wheezing, rales,rhonchi or crepitation. No use of accessory muscles of respiration.  CARDIOVASCULAR: S1, S2 normal. No murmurs, rubs, or gallops.  ABDOMEN: Soft, non-tender, non-distended.  EXTREMITIES: No pedal edema, cyanosis, or clubbing.  NEUROLOGIC: Cranial nerves II through XII are intact. Muscle strength 5/5 in all extremities. Sensation intact. Gait not checked.  PSYCHIATRIC: The patient is alert and oriented x 3.  SKIN: No obvious rash, lesion, or ulcer.   DATA REVIEW:   CBC Recent Labs  Lab 04/08/20 0525  WBC 10.9*  HGB 11.2*  HCT 34.3*  PLT 277    Chemistries  Recent Labs  Lab 04/07/20 0627 04/07/20 0627 04/07/20 1259  NA 138  --   --   K 3.4*  --   --   CL 101  --   --   CO2 25  --   --   GLUCOSE 235*  --   --   BUN 16  --   --   CREATININE 1.00  --   --   CALCIUM 8.8*  --   --   MG  --   --  2.1  AST 49*   < > 53*  ALT 64*   < > 66*  ALKPHOS 91   < > 74  BILITOT 0.5   < > 0.6   < > = values in this interval not displayed.     Microbiology Results  Results for orders placed or performed during the hospital encounter of 04/07/20  Resp Panel by RT-PCR (Flu A&B, Covid) Nasopharyngeal Swab     Status: None   Collection Time: 04/07/20  6:27 AM   Specimen: Nasopharyngeal Swab; Nasopharyngeal(NP) swabs in vial transport medium  Result Value Ref Range Status   SARS Coronavirus 2 by RT PCR NEGATIVE NEGATIVE Final     Comment: (NOTE) SARS-CoV-2 target nucleic acids are NOT DETECTED.  The SARS-CoV-2 RNA is generally detectable in upper respiratory specimens during the acute phase of infection. The lowest concentration of SARS-CoV-2 viral copies this assay can detect is 138 copies/mL. A negative result does not preclude SARS-Cov-2 infection and should not be used as the sole basis for treatment or other patient management decisions. A negative result may occur with  improper specimen collection/handling, submission of specimen other than nasopharyngeal swab, presence of viral mutation(s) within the areas targeted by this assay, and inadequate number of viral copies(<138 copies/mL). A negative result must be combined with clinical observations, patient history, and epidemiological information. The expected result is Negative.  Fact Sheet for Patients:  BloggerCourse.com  Fact Sheet for Healthcare Providers:  SeriousBroker.it  This test is no t yet approved or cleared by the [place] FDA and  has been authorized for detection and/or diagnosis of SARS-CoV-2 by FDA under an Emergency Use Authorization (EUA). This EUA will remain  in effect (meaning this test can be used) for the duration of the COVID-19 declaration under Section 564(b)(1) of the Act, 21 U.S.C.section 360bbb-3(b)(1), unless the authorization is terminated  or revoked sooner.       Influenza A by PCR NEGATIVE NEGATIVE Final   Influenza B by PCR NEGATIVE NEGATIVE Final    Comment: (NOTE) The Xpert Xpress SARS-CoV-2/FLU/RSV plus assay is intended as an aid in the diagnosis of influenza from Nasopharyngeal swab specimens and should not be used as a sole basis for treatment. Nasal washings and aspirates are unacceptable for Xpert Xpress SARS-CoV-2/FLU/RSV testing.  Fact Sheet for Patients: BloggerCourse.com  Fact Sheet for Healthcare  Providers: SeriousBroker.it  This test is not yet approved or cleared by the [place] FDA and has been authorized for detection and/or diagnosis of SARS-CoV-2 by FDA under an Emergency Use Authorization (EUA). This EUA will remain in effect (meaning this test can be used) for the duration of the COVID-19 declaration under Section 564(b)(1) of the Act, 21 U.S.C. section 360bbb-3(b)(1), unless the authorization is terminated or revoked.  Performed at [facility], [address], [place], [place] [address]     RADIOLOGY:  CT Angio Chest PE W/Cm &/Or  Wo Cm  Result Date: 04/07/2020 CLINICAL DATA:  Heart disease on Xarelto for anticoagulation. Possible pulmonary embolism. EXAM: CT ANGIOGRAPHY CHEST WITH CONTRAST TECHNIQUE: Multidetector CT imaging of the chest was performed using the standard protocol during bolus administration of intravenous contrast. Multiplanar CT image reconstructions and MIPs were obtained to evaluate the vascular anatomy. CONTRAST:  48mL OMNIPAQUE IOHEXOL 350 MG/ML SOLN COMPARISON:  09/22/2013 FINDINGS: Cardiovascular: Median sternotomy wires are present. Mild stable cardiomegaly with moderate left ventricular hypertrophy. Postsurgical change over the aortic valve. Calcified plaque over the coronary arteries. 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 Pulmonary arterial system is well opacified without evidence of emboli. Mediastinum/Nodes: No evidence of mediastinal or hilar adenopathy. Remaining mediastinal structures are normal. Lungs/Pleura: Lungs are adequately inflated without focal lobar consolidation or effusion. Airways are normal. Upper Abdomen: No acute findings. Musculoskeletal: No focal abnormality. Review of the MIP images confirms the above findings. IMPRESSION: 1. No acute cardiopulmonary disease and no evidence of pulmonary embolism. 2. Mild aneurysmal dilatation of the  ascending thoracic aorta measuring 4.3 cm in AP diameter. Recommend annual imaging followup by CTA or MRA. This recommendation follows 2010 ACCF/AHA/AATS/ACR/ASA/SCA/SCAI/SIR/STS/SVM Guidelines for the Diagnosis and Management of Patients with Thoracic Aortic Disease. Circulation. 2010; 121: U202-R427. Aortic aneurysm NOS (ICD10-I71.9). 3. Aortic atherosclerosis. Atherosclerotic coronary artery disease. Mild cardiomegaly. Aortic Atherosclerosis (ICD10-I70.0). Aortic aneurysm NOS (ICD10-I71.9). Electronically Signed   By: Elberta Fortis M.D.   On: 04/07/2020 08:55   DG Chest Portable 1 View  Result Date: 04/07/2020 CLINICAL DATA:  Acute chest pain EXAM: PORTABLE CHEST 1 VIEW COMPARISON:  11/16/2018 FINDINGS: Cardiomegaly and CABG changes noted. There is no evidence of focal airspace disease, pulmonary edema, suspicious pulmonary nodule/mass, pleural effusion, or pneumothorax. No acute bony abnormalities are identified. IMPRESSION: Cardiomegaly without evidence of acute cardiopulmonary disease. Electronically Signed   By: Harmon Pier M.D.   On: 04/07/2020 08:05   ECHOCARDIOGRAM COMPLETE  Result Date: 04/08/2020    ECHOCARDIOGRAM REPORT   Patient Name:   Craig Reese Date of Exam: 04/07/2020 Medical Rec #:  [id number]    Height:       70.0 in Accession #:    [id number]   Weight:       306.0 lb Date of Birth:  1960-12-28    BSA:          2.501 m Patient Age:    59 years     BP:           125/84 mmHg Patient Gender: M            HR:           63 bpm. Exam Location:  [facility] Procedure: 2D Echo, Cardiac Doppler and Color Doppler Indications:     Chest pain 786.50  History:         Patient has no prior history of Echocardiogram examinations.                  CAD; Risk Factors:Diabetes. PAF.  Sonographer:     Cristela Blue RDCS (AE) Referring Phys:  Kern Reap Brien Few NIU Diagnosing Phys: Arnoldo Hooker MD  Sonographer Comments: Technically challenging study due to limited acoustic windows and suboptimal apical window. IMPRESSIONS   1. Left ventricular ejection fraction, by estimation, is 45 to 50%. The left ventricle has mildly decreased function. The left ventricle demonstrates global hypokinesis. There is severe left ventricular hypertrophy. Left ventricular diastolic parameters  are consistent with Grade I diastolic dysfunction (  impaired relaxation).  2. Right ventricular systolic function is normal. The right ventricular size is normal.  3. Left atrial size was mild to moderately dilated.  4. The mitral valve is normal in structure. Mild mitral valve regurgitation.  5. The aortic valve has been repaired/replaced. Aortic valve regurgitation is not visualized. FINDINGS  Left Ventricle: Left ventricular ejection fraction, by estimation, is 45 to 50%. The left ventricle has mildly decreased function. The left ventricle demonstrates global hypokinesis. The left ventricular internal cavity size was normal in size. There is  severe left ventricular hypertrophy. Left ventricular diastolic parameters are consistent with Grade I diastolic dysfunction (impaired relaxation). Right Ventricle: The right ventricular size is normal. No increase in right ventricular wall thickness. Right ventricular systolic function is normal. Left Atrium: Left atrial size was mild to moderately dilated. Right Atrium: Right atrial size was normal in size. Pericardium: There is no evidence of pericardial effusion. Mitral Valve: The mitral valve is normal in structure. Mild mitral valve regurgitation. Tricuspid Valve: The tricuspid valve is normal in structure. Tricuspid valve regurgitation is trivial. Aortic Valve: The aortic valve has been repaired/replaced. Aortic valve regurgitation is not visualized. Aortic valve mean gradient measures 2.0 mmHg. Aortic valve peak gradient measures 3.2 mmHg. Aortic valve area, by VTI measures 2.71 cm. Pulmonic Valve: The pulmonic valve was not well visualized. Pulmonic valve regurgitation is not visualized. Aorta: Aortic root could  not be assessed. IAS/Shunts: No atrial level shunt detected by color flow Doppler.  LEFT VENTRICLE PLAX 2D LVIDd:         4.86 cm LVIDs:         3.28 cm LV PW:         1.83 cm LV IVS:        2.07 cm LVOT diam:     2.00 cm LV SV:         37 LV SV Index:   15 LVOT Area:     3.14 cm  RIGHT VENTRICLE RV Basal diam:  4.44 cm RV S prime:     10.80 cm/s LEFT ATRIUM              Index       RIGHT ATRIUM           Index LA diam:        5.60 cm  2.24 cm/m  RA Area:     23.90 cm LA Vol (A2C):   72.3 ml  28.91 ml/m RA Volume:   70.00 ml  27.99 ml/m LA Vol (A4C):   124.0 ml 49.58 ml/m LA Biplane Vol: 99.7 ml  39.87 ml/m  AORTIC VALVE                   PULMONIC VALVE AV Area (Vmax):    2.30 cm    PV Vmax:        0.75 m/s AV Area (Vmean):   2.23 cm    PV Peak grad:   2.3 mmHg AV Area (VTI):     2.71 cm    RVOT Peak grad: 3 mmHg AV Vmax:           88.95 cm/s AV Vmean:          64.850 cm/s AV VTI:            0.137 m AV Peak Grad:      3.2 mmHg AV Mean Grad:      2.0 mmHg LVOT Vmax:  65.00 cm/s LVOT Vmean:        46.100 cm/s LVOT VTI:          0.118 m LVOT/AV VTI ratio: 0.86  AORTA Ao Root diam: 3.20 cm MITRAL VALVE                TRICUSPID VALVE MV Area (PHT): 3.48 cm     TR Peak grad:   6.2 mmHg MV Decel Time: 218 msec     TR Vmax:        124.00 cm/s MV E velocity: 109.00 cm/s                             SHUNTS                             Systemic VTI:  0.12 m                             Systemic Diam: 2.00 cm Arnoldo HookerBruce Kowalski MD Electronically signed by Arnoldo HookerBruce Kowalski MD Signature Date/Time: 04/08/2020/6:24:48 AM    Final      Management plans discussed with the patient, and he is in agreement.  Case discussed through translator.  CODE STATUS:     Code Status Orders  (From admission, onward)         Start     Ordered   04/07/20 0809  Full code  Continuous        04/07/20 0809        Code Status History    Date Active Date Inactive Code Status Order ID Comments User Context   11/17/2018 0530  11/21/2018 2230 Full Code [id number]  Briscoe Deutscherpyd, Timothy S, MD Inpatient   06/06/2018 1159 06/08/2018 1904 Full Code [id number]  Ramonita LabGouru, Aruna, MD Inpatient   05/13/2018 1408 05/18/2018 1647 Full Code [id number]  Katha HammingKonidena, Snehalatha, MD ED   Advance Care Planning Activity      TOTAL TIME TAKING CARE OF THIS PATIENT: 35 minutes.    Alford Highlandichard Jadarius Commons M.D on 04/08/2020 at 1:57 PM  Between 7am to 6pm - Pager - [phone]  After 6pm go to [email or web address] - Social research officer, governmentpassword [email or web address]  [facility]  CC: Primary care physician; [facility]

## 2020-04-08 NOTE — Consult Note (Signed)
ANTICOAGULATION CONSULT NOTE   Pharmacy Consult for Heparin Indication: chest pain/ACS and atrial fibrillation  No Known Allergies  Patient Measurements: Height: 5\' 10"  (177.8 cm) Weight: (!) 138.2 kg (304 lb 9.6 oz) IBW/kg (Calculated) : 73 Heparin Dosing Weight: 105.5 kg  Vital Signs: Temp: 98.2 F (36.8 C) (11/20 2350) Temp Source: Oral (11/20 2350) BP: 131/82 (11/20 2350) Pulse Rate: 81 (11/20 2350)  Labs: Recent Labs    04/07/20 0627 04/07/20 1259 04/07/20 2101 04/08/20 0525  HGB 12.4*  --   --  11.2*  HCT 37.6*  --   --  34.3*  PLT 309  --   --  277  APTT  --  39*  --   --   LABPROT 13.0  --   --   --   INR 1.0  --   --   --   HEPARINUNFRC  --  0.12* 0.25* 0.37  CREATININE 1.00  --   --   --   TROPONINIHS 37* 42*  44*  --   --     Estimated Creatinine Clearance: 114.2 mL/min (by C-G formula based on SCr of 1 mg/dL).   Medical History: Past Medical History:  Diagnosis Date  . AF (paroxysmal atrial fibrillation) (HCC) 06/06/2018  . CAD (coronary artery disease) 05/13/2018  . Chronic diastolic CHF (congestive heart failure) (HCC) 11/17/2018  . Coronary artery disease   . Diabetes mellitus type II, non insulin dependent (HCC) 11/17/2018  . Hypothyroidism 11/17/2018    Medications:  Medications Prior to Admission  Medication Sig Dispense Refill Last Dose  . amiodarone (PACERONE) 200 MG tablet Take 1 tablet (200 mg total) by mouth daily. 30 tablet 0 04/06/2020 at Unknown time  . atorvastatin (LIPITOR) 40 MG tablet Take 1 tablet (40 mg total) by mouth daily at 6 PM. 30 tablet 0 04/06/2020 at Unknown time  . furosemide (LASIX) 40 MG tablet Take 1 tablet (40 mg total) by mouth daily. 30 tablet 0 04/06/2020 at Unknown time  . metFORMIN (GLUCOPHAGE) 500 MG tablet Take 2 tablets (1,000 mg total) by mouth 2 (two) times daily. 120 tablet 2 04/06/2020 at Unknown time  . metoprolol tartrate (LOPRESSOR) 50 MG tablet Take 1 tablet (50 mg total) by mouth 2 (two) times daily.  60 tablet 0 04/06/2020 at Unknown time  . omeprazole (PRILOSEC) 20 MG capsule Take 2 capsules (40 mg total) by mouth daily. 30 capsule 0 04/06/2020 at Unknown time  . rivaroxaban (XARELTO) 20 MG TABS tablet Take 1 tablet (20 mg total) by mouth daily. 30 tablet 0 04/06/2020 at Unknown time  . SYNTHROID 200 MCG tablet Take 200 mcg by mouth daily.   04/06/2020 at Unknown time  . acetaminophen (TYLENOL) 500 MG tablet Take 500 mg by mouth every 6 (six) hours as needed for mild pain or fever.    unknown at prn  . dexamethasone (DECADRON) 6 MG tablet Take 1 tablet (6 mg total) by mouth daily. Please take for 7 day (Patient not taking: Reported on 04/07/2020) 7 tablet 0 Not Taking at Unknown time  . gabapentin (NEURONTIN) 300 MG capsule Take 1 capsule (300 mg total) by mouth 2 (two) times daily. 60 capsule 0   . glimepiride (AMARYL) 4 MG tablet Take 1 tablet (4 mg total) by mouth daily with breakfast. 30 tablet 0   . hydrALAZINE (APRESOLINE) 25 MG tablet Take 1 tablet (25 mg total) by mouth 3 (three) times daily. 90 tablet 0   . rivaroxaban (XARELTO) 20 MG TABS  tablet Take 1 tablet by mouth daily.      Scheduled:  . amiodarone  200 mg Oral Daily  . atorvastatin  40 mg Oral q1800  . furosemide  40 mg Oral Daily  . insulin aspart  0-5 Units Subcutaneous QHS  . insulin aspart  0-9 Units Subcutaneous TID WC  . levothyroxine  200 mcg Oral QAC breakfast  . metoprolol tartrate  50 mg Oral BID  . pantoprazole  40 mg Oral Daily   Infusions:  . diltiazem (CARDIZEM) infusion 5 mg/hr (04/08/20 0026)  . heparin 1,900 Units/hr (04/08/20 0152)   PRN:  Anti-infectives (From admission, onward)   None      Assessment: 59 y.o. 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 Pharmacy has been consulted for IV heparin  dosing.   Normally, in cases where a patient takes a direct oral anticoagulant prior to admission, pharmacy will dose heparin infusion based on aPTT levels because heparin level (aka anti-Xa level) can be falsely elevated by DOACs. However, this patient's previous heparin level was not falsely elevated (that is, the heparin level correlated with the aPTT level as would be expected for someone NOT taking a DOAC prior to admission), which is unusual for someone who is reported to have last had a dose of rivaroxaban yesterday. Pharmacy will continue to dose heparin infusion based on heparin levels (rather than aPTTs) for this patient.  This evening, heparin level is subtherapeutic at 0.25. Hgb low normal at 12.4 and platelets WNL. No issues with the infusion or overt bleeding noted.  Goal of Therapy:  Heparin level 0.3-0.7 units/ml aPTT 66-102 seconds  Monitor platelets by anticoagulation protocol: Yes  Plan:  11/21:  HL @ 0525 = 0.37 Will continue pt on current rate and draw confirmation level on 11/21 @ 1130.  Tally Mattox D Clinical Pharmacist  04/08/2020   6:09 AM

## 2020-04-08 NOTE — TOC Transition Note (Signed)
Transition of Care [facility]) - CM/SW Discharge Note   Patient Details  Name: Khadeem Rockett MRN: [id number] Date of Birth: 1960/05/31  [facility] CM/SW Contact:  Bing Quarry, RN Phone Number: 04/08/2020, 11:11 AM   Clinical Narrative:    1100 OBS status changed to discharge with a Free Xarelto Coupon. Pt will be taking 20 mg per day per pharmacy consult.   1130. Coupon taken to floor, notified Unit RN Lillia Abed who is going to go over discharge with the interpreter and asked for it to be put in the chart. Included information in Spanish for [facility] and [facility] and a Spanish purple booklet. All placed in AVS  Packet in patient's chart. Gabriel Cirri RN CM          Patient Goals and CMS Choice        Discharge Placement                       Discharge Plan and Services                                     Social Determinants of Health (SDOH) Interventions     Readmission Risk Interventions No flowsheet data found.

## 2020-04-08 NOTE — Discharge Instructions (Signed)
Fibrilacin auricular Atrial Fibrillation  La fibrilacin auricular es un tipo de latido cardaco irregular o rpido (arritmia). En la fibrilacin auricular, la parte superior del corazn (aurculas) late con un patrn irregular. Esto hace que el corazn no pueda bombear sangre de [place] normal y Armed forces logistics/support/administrative officer. El [place] del tratamiento es prevenir la formacin de cogulos de [place], Chief Technology Officer la frecuencia cardaca o Medical laboratory scientific officer el ritmo normal de los latidos cardacos. Si esta afeccin no se trata, puede causar graves complicaciones, como el debilitamiento del msculo cardaco (miocardiopata) o un accidente cerebrovascular. Cules son las causas? Esta afeccin suele ser causada por otras afecciones que daan el sistema elctrico del corazn. Estas incluyen lo siguiente:  Presin arterial alta (hipertensin arterial). Esta es la causa ms frecuente.  Ciertos problemas o afecciones del corazn, como insuficiencia cardaca, arteriopata coronaria, problemas en las vlvulas cardacas o ciruga cardaca.  Diabetes.  Hiperactividad de la glndula tiroidea (hipertiroidismo).  Obesidad.  Enfermedad renal crnica. En algunos casos, se desconoce la causa de esta afeccin. Qu incrementa el riesgo? Es ms probable que Orthoptist en:  Personas de edad avanzada.  Fumadores.  Deportistas que realizan ejercicios de resistencia.  Las personas que tienen antecedentes familiares de fibrilacin auricular.  Hombres.  Personas que consumen drogas.  Personas que beben mucho alcohol.  Personas con afecciones pulmonares, como enfisema, neumona o EPOC.  Personas que tienen apnea obstructiva del sueo. Cules son los signos o sntomas? Los sntomas de esta afeccin incluyen:  Sensacin de que el corazn late rpida o irregularmente.  Malestar o Tourist information centre manager.  Falta de aire.  Mareos o debilidad repentinos.  Cansarse fcilmente al hacer ejercicio o actividad  fsica.  Fatiga.  Sncope (episodio de desmayo).  Sudoracin. En algunos casos, no hay sntomas. Cmo se diagnostica? El mdico puede detectar la fibrilacin auricular al tomarle el pulso. Si se detecta, esta afeccin podra diagnosticarse a travs de lo siguiente:  Un electrocardiograma (ECG) para controlar las seales elctricas del corazn.  Un monitor cardaco ambulatorio para Financial planner actividad del corazn durante algunos das.  Un ecocardiograma transtorcico (ETT) para obtener imgenes del corazn.  Un ecocardiograma transesofgico (ETE) para obtener imgenes incluso ms cercanas del corazn.  Una ergometra para evaluar la irrigacin sangunea mientras hace ejercicio.  Estudios de diagnstico por imgenes, como una exploracin por tomografa computarizada (TC) o una radiografa torcica.  Anlisis de [place]. Cmo se trata? El tratamiento depende de las afecciones subyacentes y de cmo se siente cuando tiene fibrilacin auricular. El tratamiento de esta afeccin puede incluir:  Medicamentos para prevenir la formacin de cogulos de sangre o tratar los problemas de frecuencia cardaca o ritmo cardaco.  Cardioversin elctrica para restablecer el ritmo cardaco.  Un marcapasos para corregir el ritmo cardaco anormal.  Ablacin para extirpar el tejido cardaco que enva seales anormales.  Cierre de Production manager auricular izquierda para sellar la zona donde pueden formarse cogulos de [place]. En algunos casos, se tratarn las afecciones subyacentes. Siga estas instrucciones en su casa: Medicamentos  Tome los medicamentos de venta libre y los recetados solamente como se lo haya indicado el mdico.  No tome medicamentos nuevos sin antes Teacher, adult education con su mdico.  Si est tomando anticoagulantes, tenga en cuenta lo siguiente: ? Hable con el mdico antes de tomar cualquier medicamento que contenga aspirina o antiinflamatorios no esteroideos (AINE), como el  ibuprofeno. Estos medicamentos aumentan el riesgo de tener una hemorragia peligrosa. ? Tome los medicamentos exactamente como se lo indicaron, [employer] a  la [employer]. ? Evite las actividades que podran causarle lesiones o moretones y [place] cadas. ? Use un brazalete de alerta mdica o lleve una tarjeta con una lista de los medicamentos que toma. Estilo de vida      No consuma ningn producto que contenga nicotina o tabaco, como cigarrillos, cigarrillos electrnicos y tabaco de Higher education careers adviser. Si necesita ayuda para dejar de fumar, consulte al mdico.  Consuma alimentos cardiosaludables. Hable con un nutricionista para crear un plan de alimentacin que sea adecuado para usted.  Haga actividad fsica habitualmente como se lo haya indicado el mdico.  No beba alcohol.  Baje de peso si es necesario.  No consuma drogas, ni siquiera cannabis. Instrucciones generales  Si tiene apnea obstructiva del sueo, controle la afeccin como se lo haya indicado el mdico.  No tome pastillas para adelgazar a menos que el mdico lo autorice. Estas pastillas pueden agravar los problemas cardacos.  Concurra a todas las visitas de seguimiento como se lo haya indicado el mdico. Esto es importante. Comunquese con un mdico si:  Nota un cambio en la frecuencia, el ritmo o la fuerza de los latidos cardacos.  Toma anticoagulantes y observa ms moretones.  Se cansa con ms facilidad cuando hace ejercicio o hace trabajos pesados.  Tiene cambios repentinos en [employer]. Solicite ayuda inmediatamente si tiene:   Dolor de pecho, dolor abdominal, sudoracin o debilidad.  Dificultad para respirar.  Efectos secundarios de los anticoagulantes, como sangre en el vmito, las heces o la [place], o sangrado que no puede detenerse.  Cualquier sntoma de un accidente cerebrovascular. "BE FAST" es una manera fcil de recordar las principales seales de advertencia de un accidente  cerebrovascular: ? B - Balance (equilibrio). Los signos son mareos, dificultad repentina para caminar o prdida del equilibrio. ? E - Eyes (ojos). Los signos son problemas para ver o un cambio repentino en la visin. ? F - Face (rostro). Los signos son debilidad repentina o adormecimiento del rostro, o el rostro o el prpado que se caen hacia un lado. ? A - Arms (brazos). Los signos son debilidad o adormecimiento en un brazo. Esto sucede de repente y generalmente en un lado del cuerpo. ? S - Speech (habla). Los signos son dificultad para hablar, hablar arrastrando las palabras o dificultad para comprender lo que la gente dice. ? T - Time (tiempo). Es tiempo de llamar al servicio de Multimedia programmer. Anote la hora a la que [employer] sntomas.  Otros signos de accidente cerebrovascular, tales como: ? Dolor de cabeza sbito e intenso que no tiene causa aparente. ? Nuseas o vmitos. ? Convulsiones. Estos sntomas pueden representar un problema grave que constituye Engineer, maintenance (IT). No espere a ver si los sntomas desaparecen. Solicite atencin mdica de inmediato. Comunquese con el servicio de emergencias de su localidad (911 en los [place]). No conduzca por sus propios medios Principal Financial. Resumen  La fibrilacin auricular es un tipo de latido cardaco irregular o rpido (arritmia).  Los sntomas incluyen sensacin de que el corazn late rpida o irregularmente.  Es posible que le den medicamentos para prevenir la formacin de cogulos de sangre o tratar los problemas de frecuencia cardaca o ritmo cardaco.  Obtenga ayuda de inmediato si tiene signos o sntomas de un accidente cerebrovascular.  Busque ayuda de inmediato si no puede respirar o siente dolor o presin en el pecho. Esta informacin no tiene Marine scientist el consejo del mdico. Asegrese de hacerle al mdico  cualquier pregunta que tenga. Document Revised: 12/21/2018 Document Reviewed: 12/21/2018 Elsevier Patient  Education  2020 [employer].

## 2020-04-08 NOTE — Consult Note (Signed)
[facility] Cardiology Consultation Note  Patient ID: Craig Reese, MRN: [id number], DOB/AGE: 57/13/1962 57 y.o. Admit date: 04/07/2020   Date of Consult: 04/08/2020 Primary Physician: [name] Primary Cardiologist: None  Chief Complaint:  Chief Complaint  Patient presents with  . Chest Pain  . Atrial Fibrillation   Reason for Consult: Chest pain Interpreter was [id number] HPI: 59 y.o. male with known coronary artery disease status post coronary artery bypass graft and aortic valve replacement with hypertension hyperlipidemia peripheral vascular disease with aortic atherosclerosis and paroxysmal nonvalvular atrial fibrillation previously placed on anticoagulation and amiodarone.  The patient claims that he has had intermittent episodes of substernal chest discomfort which come and go over the last 3 years.  These are slightly more intense at times but are most consistent every time with associated some neck discomfort.  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  The patient claims that this neck discomfort is relatively constant.  When he did not have any improvements of his chest discomfort he usually goes to the emergency room and gets morphine which helps.  Additionally the patient has had significant difficulty with getting his medication management and receiving it from the pharmacy.  There has been a gap in care.  The patient was seen in the emergency room with evaluation of this chest pain and an EKG showing atrial fibrillation with rapid ventricular rate.  The patient was placed on a diltiazem drip and has had now spontaneous conversion to normal sinus rhythm.  The likelihood of his issues include the possibility that he was not able to take his appropriate amiodarone dose.  In addition to that he has been on metoprolol for heart rate control.  The chest pain has  resolved with these treatments but did not receive nitroglycerin.  Troponin level has been 3744 and 42 more consistent with demand ischemia and no current evidence of acute coronary syndrome.  CT scan showed aortic atherosclerosis but no evidence of other issues.  Chest x-ray showed cardiomegaly but no evidence of congestive heart failure echocardiogram showed mild global LV systolic dysfunction with ejection fraction of 45% consistent with atrial fibrillation rather than heart failure with left ventricular hypertrophy and normal function of the valves.  He did of note have a TSH of 10,366.  The patient now feels somewhat better at this time and therefore may need further can control and medication management of all of above  Past Medical History:  Diagnosis Date  . AF (paroxysmal atrial fibrillation) (HCC) 06/06/2018  . CAD (coronary artery disease) 05/13/2018  . Chronic diastolic CHF (congestive heart failure) (HCC) 11/17/2018  . Coronary artery disease   . Diabetes mellitus type II, non insulin dependent (HCC) 11/17/2018  . Hypothyroidism 11/17/2018      Surgical History:  Past Surgical History:  Procedure Laterality Date  . CORONARY ARTERY BYPASS GRAFT    . LEFT HEART CATH AND CORS/GRAFTS ANGIOGRAPHY N/A 05/17/2018   Procedure: LEFT HEART CATH AND CORS/GRAFTS ANGIOGRAPHY and possible PCI and stent;  Surgeon: Alwyn Pea, MD;  Location: [facility] INVASIVE CV LAB;  Service: Cardiovascular;  Laterality: N/A;     Home Meds: Prior to Admission medications   Medication Sig Start Date End Date Taking? Authorizing Provider  amiodarone (PACERONE) 200 MG tablet Take 1 tablet (200 mg total) by mouth daily. 11/21/18  Yes Elgergawy, Leana Roe, MD  atorvastatin (LIPITOR) 40  MG tablet Take 1 tablet (40 mg total) by mouth daily at 6 PM. 11/21/18  Yes Elgergawy, Leana Roeawood S, MD  furosemide (LASIX) 40 MG tablet Take 1 tablet (40 mg total) by mouth daily. 11/21/18  Yes Elgergawy, Leana Roeawood S, MD  metFORMIN (GLUCOPHAGE) 500  MG tablet Take 2 tablets (1,000 mg total) by mouth 2 (two) times daily. 11/21/18 05/29/20 Yes Elgergawy, Leana Roeawood S, MD  metoprolol tartrate (LOPRESSOR) 50 MG tablet Take 1 tablet (50 mg total) by mouth 2 (two) times daily. 11/21/18  Yes Elgergawy, Leana Roeawood S, MD  omeprazole (PRILOSEC) 20 MG capsule Take 2 capsules (40 mg total) by mouth daily. 11/21/18  Yes Elgergawy, Leana Roeawood S, MD  rivaroxaban (XARELTO) 20 MG TABS tablet Take 1 tablet (20 mg total) by mouth daily. 11/21/18 04/07/20 Yes Elgergawy, Leana Roeawood S, MD  SYNTHROID 200 MCG tablet Take 200 mcg by mouth daily. 03/24/20  Yes [provider]  acetaminophen (TYLENOL) 500 MG tablet Take 500 mg by mouth every 6 (six) hours as needed for mild pain or fever.     [provider]  dexamethasone (DECADRON) 6 MG tablet Take 1 tablet (6 mg total) by mouth daily. Please take for 7 day Patient not taking: Reported on 04/07/2020 11/21/18   Elgergawy, Leana Roeawood S, MD  gabapentin (NEURONTIN) 300 MG capsule Take 1 capsule (300 mg total) by mouth 2 (two) times daily. 11/21/18 02/06/20  Elgergawy, Leana Roeawood S, MD  glimepiride (AMARYL) 4 MG tablet Take 1 tablet (4 mg total) by mouth daily with breakfast. 11/21/18   Elgergawy, Leana Roeawood S, MD  hydrALAZINE (APRESOLINE) 25 MG tablet Take 1 tablet (25 mg total) by mouth 3 (three) times daily. 11/21/18   Elgergawy, Leana Roeawood S, MD  rivaroxaban (XARELTO) 20 MG TABS tablet Take 1 tablet by mouth daily. 05/09/15 11/17/18  [provider]    Inpatient Medications:  . amiodarone  200 mg Oral Daily  . atorvastatin  40 mg Oral q1800  . furosemide  40 mg Oral Daily  . insulin aspart  0-5 Units Subcutaneous QHS  . insulin aspart  0-9 Units Subcutaneous TID WC  . levothyroxine  200 mcg Oral QAC breakfast  . metoprolol tartrate  50 mg Oral BID  . pantoprazole  40 mg Oral Daily   . diltiazem (CARDIZEM) infusion 5 mg/hr (04/08/20 0026)  . heparin 1,900 Units/hr (04/08/20 0152)    Allergies: No Known Allergies  Social History    Socioeconomic History  . Marital status: Single    Spouse name: Not on file  . Number of children: Not on file  . Years of education: Not on file  . Highest education level: Not on file  Occupational History  . Not on file  Tobacco Use  . Smoking status: Never Smoker  . Smokeless tobacco: Never Used  Vaping Use  . Vaping Use: Never used  Substance and Sexual Activity  . Alcohol use: Not Currently  . Drug use: Never  . Sexual activity: Not Currently  Other Topics Concern  . Not on file  Social History Narrative  . Not on file   Social Determinants of Health   Financial Resource Strain:   . Difficulty of Paying Living Expenses: Not on file  Food Insecurity:   . Worried About Programme researcher, broadcasting/film/videounning Out of Food in the Last Year: Not on file  . Ran Out of Food in the Last Year: Not on file  Transportation Needs:   . Lack of Transportation (Medical): Not on file  . Lack of Transportation (Non-Medical):  Not on file  Physical Activity:   . Days of Exercise per Week: Not on file  . Minutes of Exercise per Session: Not on file  Stress:   . Feeling of Stress : Not on file  Social Connections:   . Frequency of Communication with Friends and Family: Not on file  . Frequency of Social Gatherings with Friends and Family: Not on file  . Attends Religious Services: Not on file  . Active Member of Clubs or Organizations: Not on file  . Attends Banker Meetings: Not on file  . Marital Status: Not on file  Intimate Partner Violence:   . Fear of Current or Ex-Partner: Not on file  . Emotionally Abused: Not on file  . Physically Abused: Not on file  . Sexually Abused: Not on file     History reviewed. No pertinent family history.   Review of Systems Positive for chest pain Negative for: General:  chills, fever, night sweats or weight changes.  Cardiovascular: PND orthopnea syncope dizziness  Dermatological skin lesions rashes Respiratory: Cough congestion Urologic: Frequent  urination urination at night and hematuria Abdominal: negative for nausea, vomiting, diarrhea, bright red blood per rectum, melena, or hematemesis Neurologic: negative for visual changes, and/or hearing changes  All other systems reviewed and are otherwise negative except as noted above.  Labs: No results for input(s): CKTOTAL, CKMB, TROPONINI in the last 72 hours. Lab Results  Component Value Date   WBC 10.9 (H) 04/08/2020   HGB 11.2 (L) 04/08/2020   HCT 34.3 (L) 04/08/2020   MCV 87.9 04/08/2020   PLT 277 04/08/2020    Recent Labs  Lab 04/07/20 0627 04/07/20 0627 04/07/20 1259  NA 138  --   --   K 3.4*  --   --   CL 101  --   --   CO2 25  --   --   BUN 16  --   --   CREATININE 1.00  --   --   CALCIUM 8.8*  --   --   PROT 8.2*   < > 7.8  BILITOT 0.5   < > 0.6  ALKPHOS 91   < > 74  ALT 64*   < > 66*  AST 49*   < > 53*  GLUCOSE 235*  --   --    < > = values in this interval not displayed.   Lab Results  Component Value Date   CHOL 188 04/08/2020   HDL 24 (L) 04/08/2020   LDLCALC 115 (H) 04/08/2020   TRIG 246 (H) 04/08/2020   Lab Results  Component Value Date   DDIMER 0.31 11/21/2018    Radiology/Studies:  CT Angio Chest PE W/Cm &/Or Wo Cm  Result Date: 04/07/2020 CLINICAL DATA:  Heart disease on Xarelto for anticoagulation. Possible pulmonary embolism. EXAM: CT ANGIOGRAPHY CHEST WITH CONTRAST TECHNIQUE: Multidetector CT imaging of the chest was performed using the standard protocol during bolus administration of intravenous contrast. Multiplanar CT image reconstructions and MIPs were obtained to evaluate the vascular anatomy. CONTRAST:  75mL OMNIPAQUE IOHEXOL 350 MG/ML SOLN COMPARISON:  09/22/2013 FINDINGS: Cardiovascular: Median sternotomy wires are present. Mild stable cardiomegaly with moderate left ventricular hypertrophy. Postsurgical change over the aortic valve. Calcified plaque over the coronary arteries. Mild aneurysmal dilatation of the ascending thoracic  aorta measuring 4.3 cm in AP diameter. Pulmonary arterial system is well opacified without evidence of emboli. Mediastinum/Nodes: No evidence of mediastinal or hilar adenopathy. Remaining mediastinal structures are normal. Lungs/Pleura:  Lungs are adequately inflated without focal lobar consolidation or effusion. Airways are normal. Upper Abdomen: No acute findings. Musculoskeletal: No focal abnormality. Review of the MIP images confirms the above findings. IMPRESSION: 1. No acute cardiopulmonary disease and no evidence of pulmonary embolism. 2. 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 Recommend annual imaging followup by CTA or MRA. This recommendation follows 2010 ACCF/AHA/AATS/ACR/ASA/SCA/SCAI/SIR/STS/SVM Guidelines for the Diagnosis and Management of Patients with Thoracic Aortic Disease. Circulation. 2010; 121: W098-J191. Aortic aneurysm NOS (ICD10-I71.9). 3. Aortic atherosclerosis. Atherosclerotic coronary artery disease. Mild cardiomegaly. Aortic Atherosclerosis (ICD10-I70.0). Aortic aneurysm NOS (ICD10-I71.9). Electronically Signed   By: Elberta Fortis M.D.   On: 04/07/2020 08:55   DG Chest Portable 1 View  Result Date: 04/07/2020 CLINICAL DATA:  Acute chest pain EXAM: PORTABLE CHEST 1 VIEW COMPARISON:  11/16/2018 FINDINGS: Cardiomegaly and CABG changes noted. There is no evidence of focal airspace disease, pulmonary edema, suspicious pulmonary nodule/mass, pleural effusion, or pneumothorax. No acute bony abnormalities are identified. IMPRESSION: Cardiomegaly without evidence of acute cardiopulmonary disease. Electronically Signed   By: Harmon Pier M.D.   On: 04/07/2020 08:05   ECHOCARDIOGRAM COMPLETE  Result Date: 04/08/2020    ECHOCARDIOGRAM REPORT   Patient Name:   Craig Reese Date of Exam: 04/07/2020 Medical Rec #:  [id number]    Height:       70.0 in Accession #:    [id number]   Weight:       306.0 lb Date of Birth:  April 25, 1961    BSA:           2.501 m Patient Age:    59 years     BP:           125/84 mmHg Patient Gender: M            HR:           63 bpm. Exam Location:  [facility] Procedure: 2D Echo, Cardiac Doppler and Color Doppler Indications:     Chest pain 786.50  History:         Patient has no prior history of Echocardiogram examinations.                  CAD; Risk Factors:Diabetes. PAF.  Sonographer:     Cristela Blue RDCS (AE) Referring Phys:  Kern Reap Brien Few NIU Diagnosing Phys: Arnoldo Hooker MD  Sonographer Comments: Technically challenging study due to limited acoustic windows and suboptimal apical window. IMPRESSIONS  1. Left ventricular ejection fraction, by estimation, is 45 to 50%. The left ventricle has mildly decreased function. The left ventricle demonstrates global hypokinesis. There is severe left ventricular hypertrophy. Left ventricular diastolic parameters  are consistent with Grade I diastolic dysfunction (impaired relaxation).  2. Right ventricular systolic function is normal. The right ventricular size is normal.  3. Left atrial size was mild to moderately dilated.  4. The mitral valve is normal in structure. Mild mitral valve regurgitation.  5. The aortic valve has been repaired/replaced. Aortic valve regurgitation is not visualized. FINDINGS  Left Ventricle: Left ventricular ejection fraction, by estimation, is 45 to 50%. The left ventricle has mildly decreased function. The left ventricle demonstrates global hypokinesis. The left ventricular internal cavity size was normal in size. There is  severe left ventricular hypertrophy. Left ventricular diastolic parameters are consistent with Grade I diastolic dysfunction (impaired relaxation). Right Ventricle: The right ventricular size is normal. No increase in right ventricular wall thickness. Right ventricular systolic function is normal. Left Atrium:  Left atrial size was mild to moderately dilated. Right Atrium: Right atrial size was normal in size. Pericardium: There is no evidence of  pericardial effusion. Mitral Valve: The mitral valve is normal in structure. Mild mitral valve regurgitation. Tricuspid Valve: The tricuspid valve is normal in structure. Tricuspid valve regurgitation is trivial. Aortic Valve: The aortic valve has been repaired/replaced. Aortic valve regurgitation is not visualized. Aortic valve mean gradient measures 2.0 mmHg. Aortic valve peak gradient measures 3.2 mmHg. Aortic valve area, by VTI measures 2.71 cm. Pulmonic Valve: The pulmonic valve was not well visualized. Pulmonic valve regurgitation is not visualized. Aorta: Aortic root could not be assessed. IAS/Shunts: No atrial level shunt detected by color flow Doppler.  LEFT VENTRICLE PLAX 2D LVIDd:         4.86 cm LVIDs:         3.28 cm LV PW:         1.83 cm LV IVS:        2.07 cm LVOT diam:     2.00 cm LV SV:         37 LV SV Index:   15 LVOT Area:     3.14 cm  RIGHT VENTRICLE RV Basal diam:  4.44 cm RV S prime:     10.80 cm/s LEFT ATRIUM              Index       RIGHT ATRIUM           Index LA diam:        5.60 cm  2.24 cm/m  RA Area:     23.90 cm LA Vol (A2C):   72.3 ml  28.91 ml/m RA Volume:   70.00 ml  27.99 ml/m LA Vol (A4C):   124.0 ml 49.58 ml/m LA Biplane Vol: 99.7 ml  39.87 ml/m  AORTIC VALVE                   PULMONIC VALVE AV Area (Vmax):    2.30 cm    PV Vmax:        0.75 m/s AV Area (Vmean):   2.23 cm    PV Peak grad:   2.3 mmHg AV Area (VTI):     2.71 cm    RVOT Peak grad: 3 mmHg AV Vmax:           88.95 cm/s AV Vmean:          64.850 cm/s AV VTI:            0.137 m AV Peak Grad:      3.2 mmHg AV Mean Grad:      2.0 mmHg LVOT Vmax:         65.00 cm/s LVOT Vmean:        46.100 cm/s LVOT VTI:          0.118 m LVOT/AV VTI ratio: 0.86  AORTA Ao Root diam: 3.20 cm MITRAL VALVE                TRICUSPID VALVE MV Area (PHT): 3.48 cm     TR Peak grad:   6.2 mmHg MV Decel Time: 218 msec     TR Vmax:        124.00 cm/s MV E velocity: 109.00 cm/s                             SHUNTS  Systemic VTI:  0.12 m                             Systemic Diam: 2.00 cm Arnoldo Hooker MD Electronically signed by Arnoldo Hooker MD Signature Date/Time: 04/08/2020/6:24:48 AM    Final     EKG: Atrial fibrillation with rapid ventricular rate now in normal sinus rhythm with left anterior fascicular block  Weights: Filed Weights   04/07/20 0614 04/07/20 1746  Weight: (!) 138.8 kg (!) 138.2 kg     Physical Exam: Blood pressure 131/82, pulse 81, temperature 98.2 F (36.8 C), temperature source Oral, resp. rate 14, height 5\' 10"  (1.778 m), weight (!) 138.2 kg, SpO2 96 %. Body mass index is 43.71 kg/m. General: Well developed, well nourished, in no acute distress. Head eyes ears nose throat: Normocephalic, atraumatic, sclera non-icteric, no xanthomas, nares are without discharge. No apparent thyromegaly but slight enlargement of left neck area Lungs: Normal respiratory effort.  Few wheezes, no rales, no rhonchi.  Heart: RRR with normal S1 S2.  2+ right upper sternal border murmur gallop, no rub, PMI is normal size and placement, carotid upstroke normal without bruit, jugular venous pressure is normal Abdomen: Soft, non-tender, non-distended with normoactive bowel sounds. No hepatomegaly. No rebound/guarding. No obvious abdominal masses. Abdominal aorta is normal size without bruit Extremities: Trace edema. no cyanosis, no clubbing, no ulcers  Peripheral : 2+ bilateral upper extremity pulses, 2+ bilateral femoral pulses, 2+ bilateral dorsal pedal pulse Neuro: Alert and oriented. No facial asymmetry. No focal deficit. Moves all extremities spontaneously. Musculoskeletal: Normal muscle tone without kyphosis Psych:  Responds to questions appropriately with a normal affect.    Assessment: 60 year old male with known coronary artery disease status post coronary artery bypass graft aortic valve replacement paroxysmal nonvalvular atrial fibrillation hypertension hyperlipidemia thyroid disease aortic  atherosclerosis and left ventricular hypertrophy having atypical chest pain without evidence of congestive heart failure or myocardial infarction and/or acute coronary syndrome needing further medical management and treatment options  Plan: 1.  Discontinuation of diltiazem drip at this time due to spontaneous conversion to normal sinus rhythm.  Will increase amiodarone to 200 mg twice per day in case the patient has had a loss of medication management due to pharmacy issues. 2.  Reinstatement of Xarelto at 50 mg each day for further risk reduction in stroke with atrial fibrillation and discontinuation of heparin due to no evidence of myocardial infarction and/or acute coronary syndrome 3.  Continuation of metoprolol for heart rate control treatment of angina and treatment of hypertension at 50 mg twice per day 4.  Furosemide for lower extremity edema and diastolic dysfunction heart failure for which is stable and no exacerbation at this time 5.  High intensity cholesterol therapy treatment with atorvastatin without change at 40 mg each day 6.  Begin ambulation and following for improvements of symptoms and if no further symptoms or recurrent episodes of chest discomfort or recurrent atrial fibrillation with changes left above would consider the possibility of discharge to home with follow-up for concerns as listed above 7.  No further cardiac diagnostics necessary at this time  Signed, 60 M.D. [facility] Cardiology 04/08/2020, 7:40 AM

## 2020-04-08 NOTE — Progress Notes (Signed)
ANTICOAGULATION CONSULT NOTE - Initial Consult  Pharmacy Consult for Xarelto (Rivaroxaban)  Indication: atrial fibrillation  No Known Allergies  Patient Measurements: Height: 5\' 10"  (177.8 cm) Weight: (!) 138.2 kg (304 lb 9.6 oz) IBW/kg (Calculated) : 73  Vital Signs: Temp: 98.2 F (36.8 C) (11/20 2350) Temp Source: Oral (11/20 2350) BP: 131/82 (11/20 2350) Pulse Rate: 81 (11/20 2350)  Labs: Recent Labs    04/07/20 0627 04/07/20 1259 04/07/20 2101 04/08/20 0525  HGB 12.4*  --   --  11.2*  HCT 37.6*  --   --  34.3*  PLT 309  --   --  277  APTT  --  39*  --   --   LABPROT 13.0  --   --   --   INR 1.0  --   --   --   HEPARINUNFRC  --  0.12* 0.25* 0.37  CREATININE 1.00  --   --   --   TROPONINIHS 37* 42*  44*  --   --     Estimated Creatinine Clearance: 114.2 mL/min (by C-G formula based on SCr of 1 mg/dL).   Medical History: Past Medical History:  Diagnosis Date  . AF (paroxysmal atrial fibrillation) (HCC) 06/06/2018  . CAD (coronary artery disease) 05/13/2018  . Chronic diastolic CHF (congestive heart failure) (HCC) 11/17/2018  . Coronary artery disease   . Diabetes mellitus type II, non insulin dependent (HCC) 11/17/2018  . Hypothyroidism 11/17/2018    Assessment:  59 yo male admitted with Chest pain. Patient has PMH coronary artery bypass graft aortic valve replacement, A. Fib, HTN. HLD, MI. Patient is currently receiving heparin infusion. Pharmacy now consulted for Rivaroxaban 20mg  dosing.   Patient was taking Xarelto PTA    Plan:  Heparin infusion discontinued. Will start Rivaroxaban 20mg  daily. Monitor CBC at least every 3 days per protocol while admitted.   59, PharmD, BCPS Clinical Pharmacist 04/08/2020 7:59 AM

## 2020-07-01 IMAGING — DX DG CHEST 1V PORT
1 series · 1 of 1 positions shown · non-contrast
Comparison: Chest x-ray dated September 22, 2013.

CLINICAL DATA: Chest pain.

EXAM:
PORTABLE CHEST 1 VIEW

[chest ap]
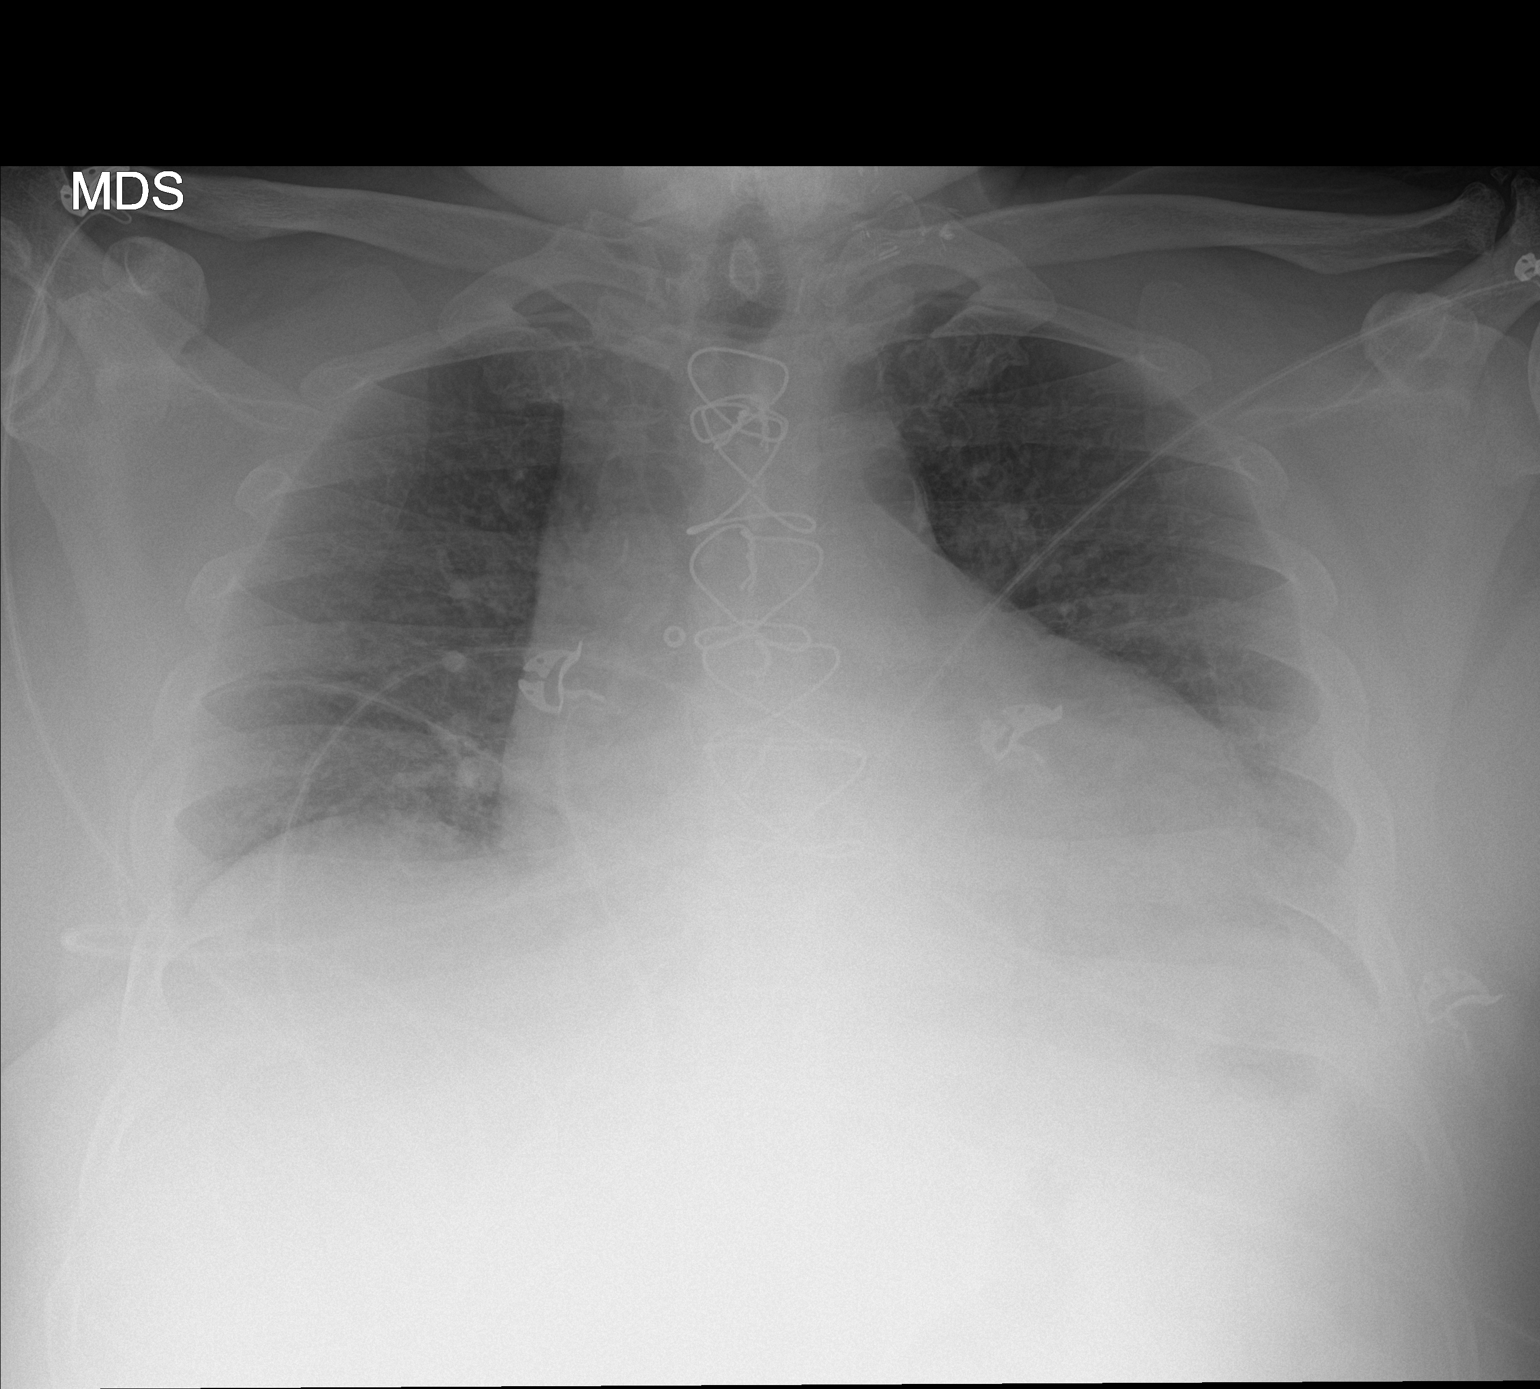

[1 of 1 positions shown; findings below may reference images not displayed]

FINDINGS: Lordotic positioning. Mild cardiomegaly status post CABG. Normal
pulmonary vascularity. Low lung volumes with mild bibasilar
atelectasis. No focal consolidation, pleural effusion, or
pneumothorax. No acute osseous abnormality.
IMPRESSION: Cardiomegaly.  Low lung volumes and mild bibasilar atelectasis.

## 2020-07-05 IMAGING — DX DG FOOT COMPLETE 3+V*R*
3 series · 3 of 3 positions shown · non-contrast
Comparison: None.

CLINICAL DATA: Foot and ankle pain

EXAM:
RIGHT FOOT COMPLETE - 3+ VIEW

[foot ap]
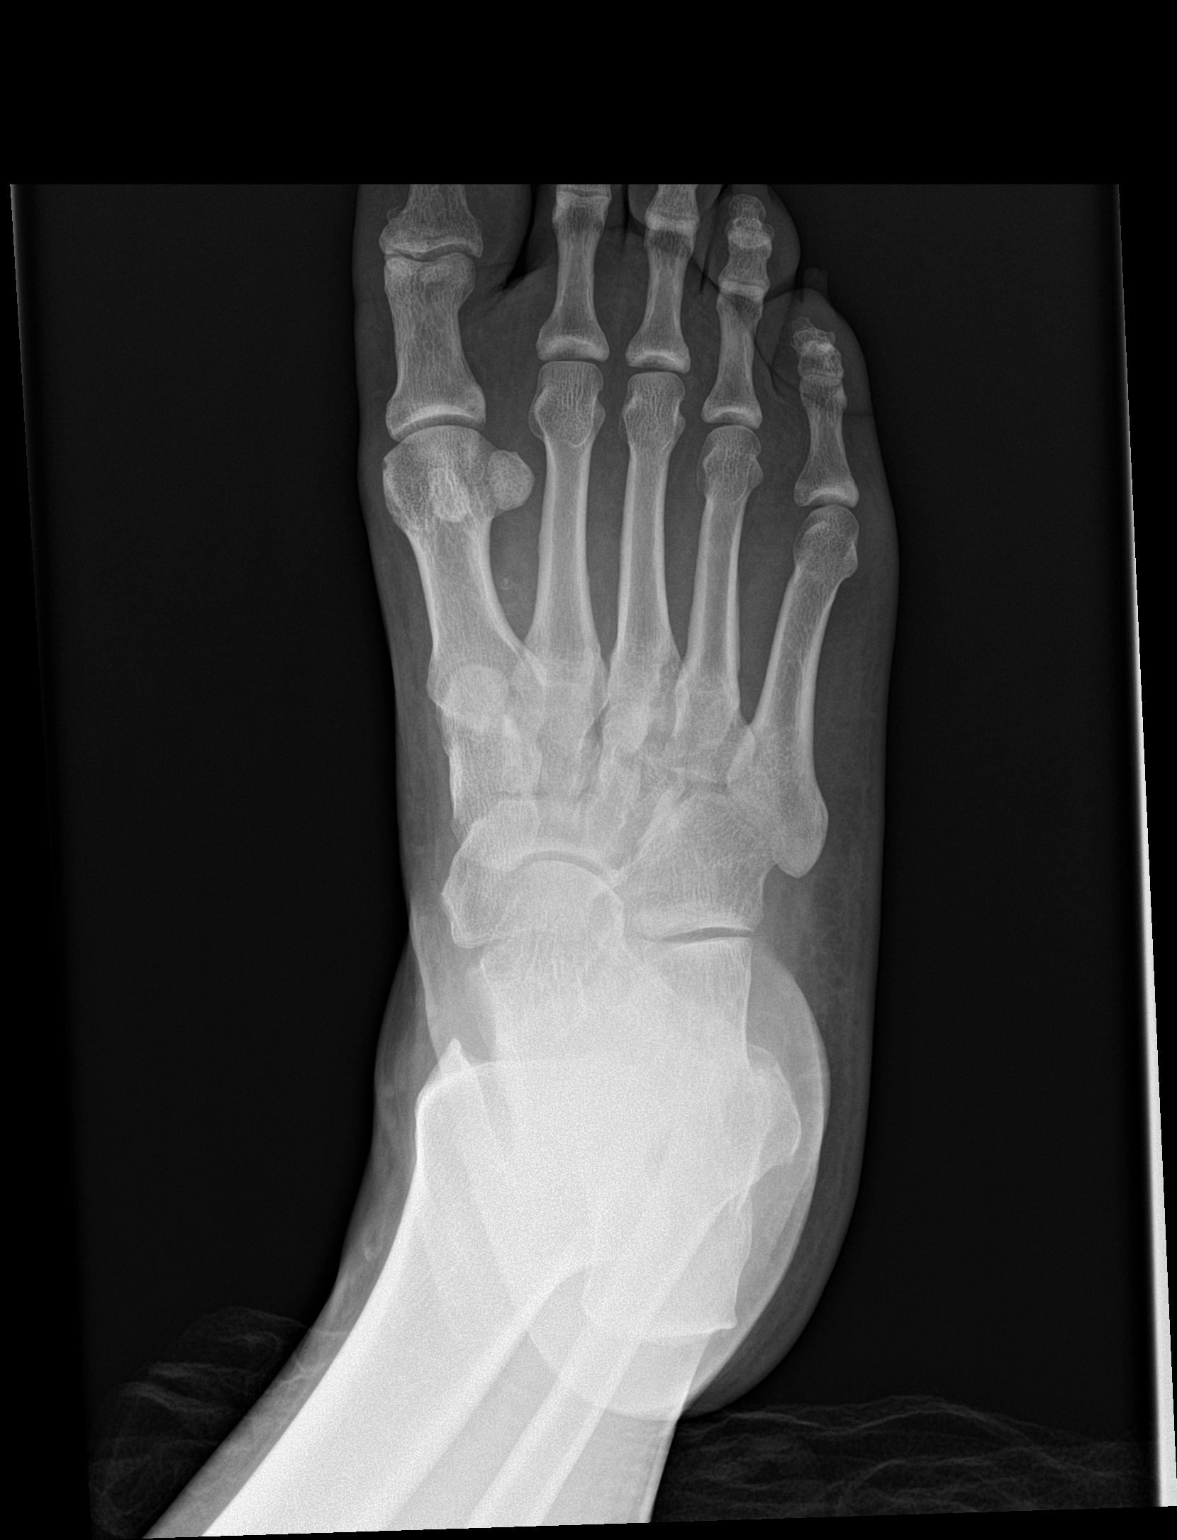

[foot obl]
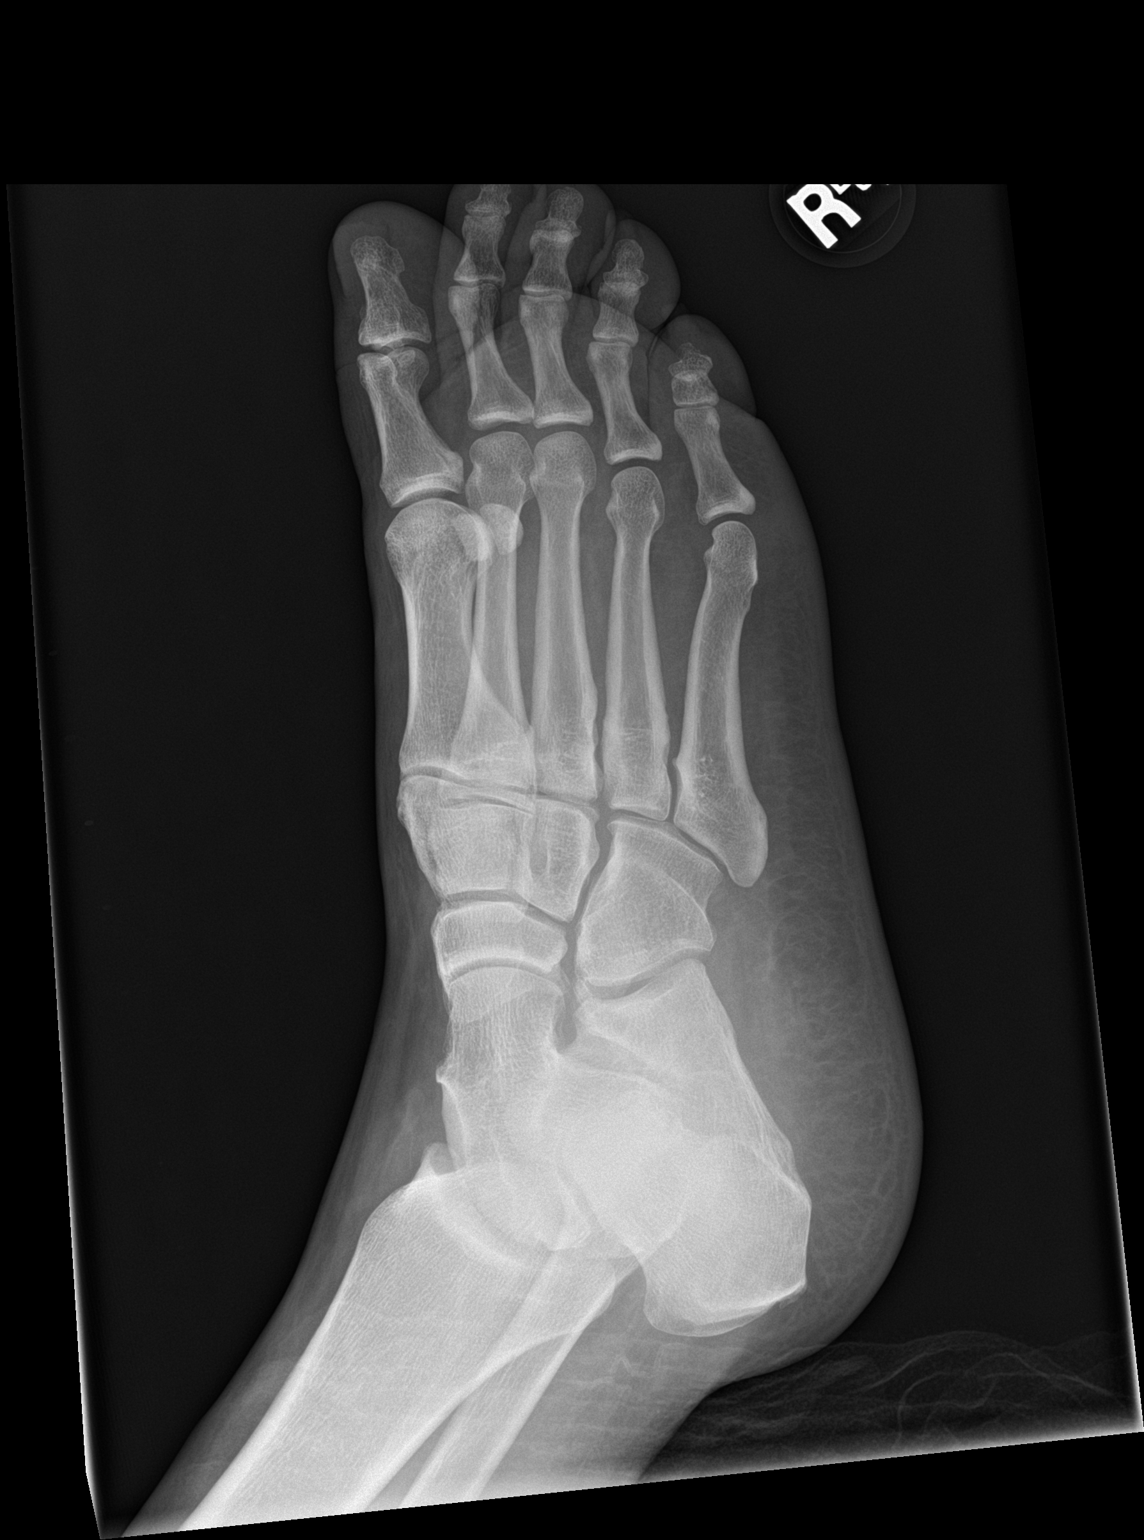

[foot lat]
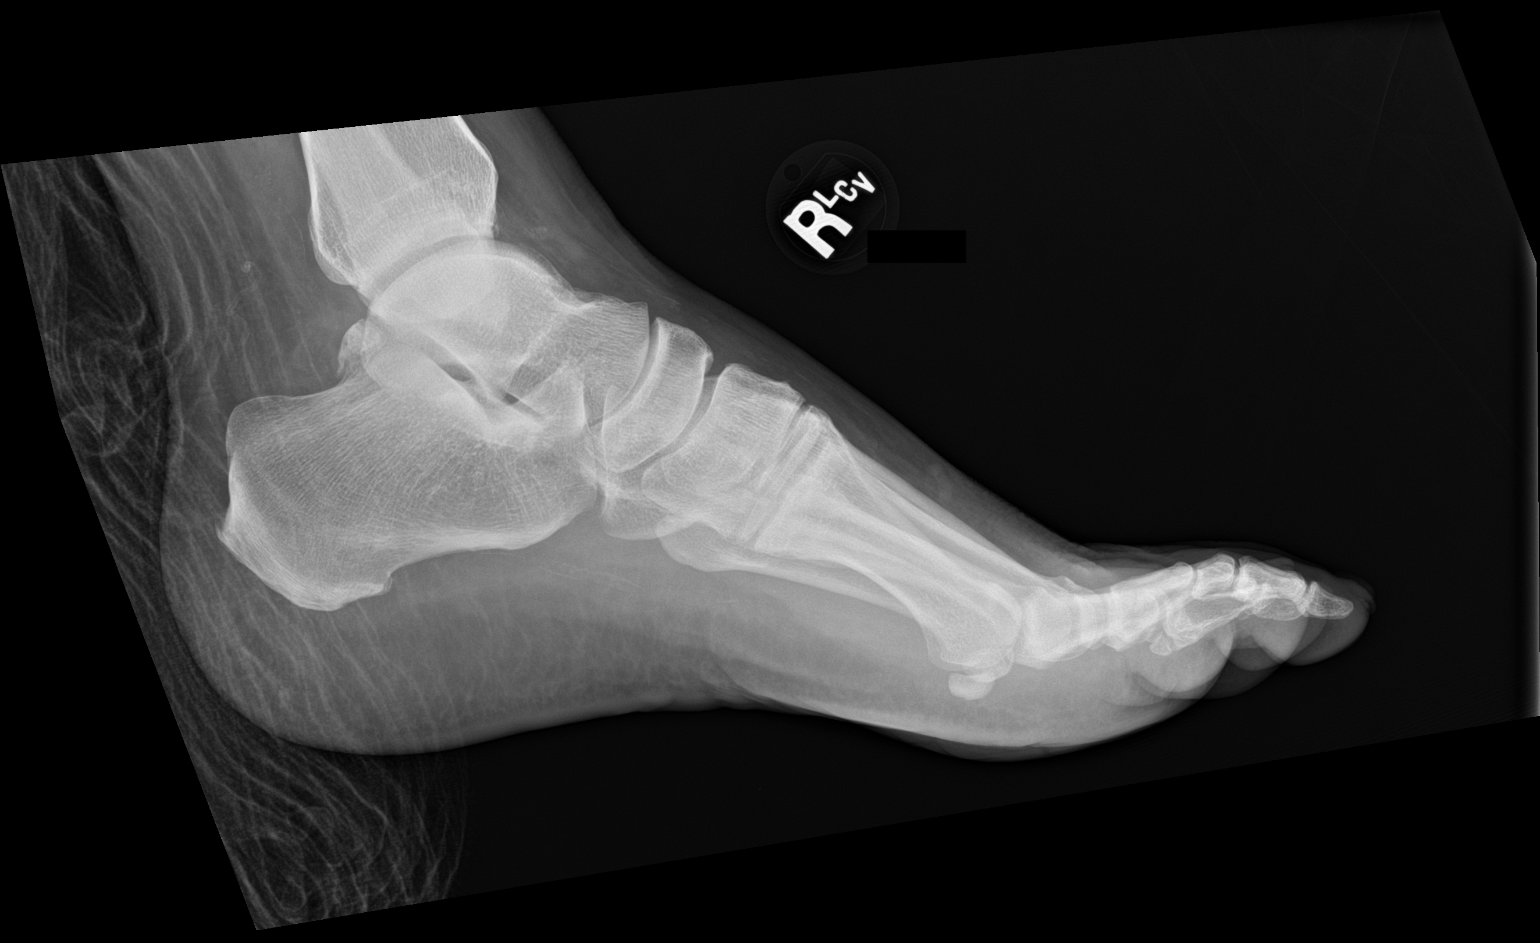

[3 of 3 positions shown; findings below may reference images not displayed]

FINDINGS: There is no evidence of fracture or dislocation. There is no
evidence of arthropathy or other focal bone abnormality. Soft
tissues are unremarkable.
IMPRESSION: Negative.

## 2022-03-06 ENCOUNTER — Inpatient Hospital Stay
Admission: EM | Admit: 2022-03-06 | Discharge: 2022-03-09 | DRG: 281 | Disposition: A | Discharge status: Home / Self Care | Payer: Self-pay | Attending: Internal Medicine | Admitting: Internal Medicine

## 2022-03-06 ENCOUNTER — Other Ambulatory Visit: Payer: Self-pay

## 2022-03-06 ENCOUNTER — Emergency Department: Payer: Self-pay

## 2022-03-06 DIAGNOSIS — I214 Non-ST elevation (NSTEMI) myocardial infarction: Principal | ICD-10-CM | POA: Diagnosis present

## 2022-03-06 DIAGNOSIS — E785 Hyperlipidemia, unspecified: Secondary | ICD-10-CM | POA: Diagnosis present

## 2022-03-06 DIAGNOSIS — Z79899 Other long term (current) drug therapy: Secondary | ICD-10-CM

## 2022-03-06 DIAGNOSIS — I2510 Atherosclerotic heart disease of native coronary artery without angina pectoris: Secondary | ICD-10-CM | POA: Diagnosis present

## 2022-03-06 DIAGNOSIS — N182 Chronic kidney disease, stage 2 (mild): Secondary | ICD-10-CM | POA: Diagnosis present

## 2022-03-06 DIAGNOSIS — Z953 Presence of xenogenic heart valve: Secondary | ICD-10-CM

## 2022-03-06 DIAGNOSIS — N189 Chronic kidney disease, unspecified: Secondary | ICD-10-CM | POA: Diagnosis present

## 2022-03-06 DIAGNOSIS — I5042 Chronic combined systolic (congestive) and diastolic (congestive) heart failure: Secondary | ICD-10-CM | POA: Diagnosis present

## 2022-03-06 DIAGNOSIS — I4891 Unspecified atrial fibrillation: Secondary | ICD-10-CM

## 2022-03-06 DIAGNOSIS — Z951 Presence of aortocoronary bypass graft: Secondary | ICD-10-CM

## 2022-03-06 DIAGNOSIS — Z7984 Long term (current) use of oral hypoglycemic drugs: Secondary | ICD-10-CM

## 2022-03-06 DIAGNOSIS — Z6841 Body Mass Index (BMI) 40.0 and over, adult: Secondary | ICD-10-CM

## 2022-03-06 DIAGNOSIS — E1169 Type 2 diabetes mellitus with other specified complication: Secondary | ICD-10-CM | POA: Diagnosis present

## 2022-03-06 DIAGNOSIS — R7989 Other specified abnormal findings of blood chemistry: Secondary | ICD-10-CM | POA: Diagnosis present

## 2022-03-06 DIAGNOSIS — E1165 Type 2 diabetes mellitus with hyperglycemia: Secondary | ICD-10-CM | POA: Diagnosis present

## 2022-03-06 DIAGNOSIS — I480 Paroxysmal atrial fibrillation: Secondary | ICD-10-CM | POA: Diagnosis present

## 2022-03-06 DIAGNOSIS — Z7901 Long term (current) use of anticoagulants: Secondary | ICD-10-CM

## 2022-03-06 DIAGNOSIS — E1122 Type 2 diabetes mellitus with diabetic chronic kidney disease: Secondary | ICD-10-CM | POA: Diagnosis present

## 2022-03-06 DIAGNOSIS — E669 Obesity, unspecified: Secondary | ICD-10-CM | POA: Diagnosis present

## 2022-03-06 DIAGNOSIS — E039 Hypothyroidism, unspecified: Secondary | ICD-10-CM | POA: Diagnosis present

## 2022-03-06 DIAGNOSIS — Z8249 Family history of ischemic heart disease and other diseases of the circulatory system: Secondary | ICD-10-CM

## 2022-03-06 DIAGNOSIS — I10 Essential (primary) hypertension: Secondary | ICD-10-CM | POA: Diagnosis present

## 2022-03-06 DIAGNOSIS — R079 Chest pain, unspecified: Secondary | ICD-10-CM | POA: Diagnosis present

## 2022-03-06 DIAGNOSIS — I130 Hypertensive heart and chronic kidney disease with heart failure and stage 1 through stage 4 chronic kidney disease, or unspecified chronic kidney disease: Secondary | ICD-10-CM | POA: Diagnosis present

## 2022-03-06 DIAGNOSIS — I351 Nonrheumatic aortic (valve) insufficiency: Secondary | ICD-10-CM | POA: Diagnosis present

## 2022-03-06 DIAGNOSIS — Z79890 Hormone replacement therapy (postmenopausal): Secondary | ICD-10-CM

## 2022-03-06 DIAGNOSIS — N179 Acute kidney failure, unspecified: Secondary | ICD-10-CM | POA: Diagnosis present

## 2022-03-06 LAB — BASIC METABOLIC PANEL
Anion gap: 12 (ref 5–15)
BUN: 30 mg/dL — ABNORMAL HIGH (ref 6–20)
CO2: 26 mmol/L (ref 22–32)
Calcium: 9.3 mg/dL (ref 8.9–10.3)
Chloride: 97 mmol/L — ABNORMAL LOW (ref 98–111)
Creatinine, Ser: 1.51 mg/dL — ABNORMAL HIGH (ref 0.61–1.24)
GFR, Estimated: 53 mL/min — ABNORMAL LOW (ref >60)
Glucose, Bld: 219 mg/dL — ABNORMAL HIGH (ref 70–99)
Potassium: 3.5 mmol/L (ref 3.5–5.1)
Sodium: 135 mmol/L (ref 135–145)

## 2022-03-06 LAB — CBC
HCT: 39.2 % (ref 39.0–52.0)
Hemoglobin: 11.9 g/dL — ABNORMAL LOW (ref 13.0–17.0)
MCH: 24.8 pg — ABNORMAL LOW (ref 26.0–34.0)
MCHC: 30.4 g/dL (ref 30.0–36.0)
MCV: 81.8 fL (ref 80.0–100.0)
Platelets: 310 10*3/uL (ref 150–400)
RBC: 4.79 MIL/uL (ref 4.22–5.81)
RDW: 15.7 % — ABNORMAL HIGH (ref 11.5–15.5)
WBC: 10.8 10*3/uL — ABNORMAL HIGH (ref 4.0–10.5)
nRBC: 0 % (ref 0.0–0.2)

## 2022-03-06 LAB — CBG MONITORING, ED: Glucose-Capillary: 154 mg/dL — ABNORMAL HIGH (ref 70–99)

## 2022-03-06 LAB — TROPONIN I (HIGH SENSITIVITY)
Troponin I (High Sensitivity): 34 ng/L — ABNORMAL HIGH (ref <18)
Troponin I (High Sensitivity): 48 ng/L — ABNORMAL HIGH (ref <18)
Troponin I (High Sensitivity): 91 ng/L — ABNORMAL HIGH (ref <18)

## 2022-03-06 LAB — HEMOGLOBIN A1C
Hgb A1c MFr Bld: 9.5 % — ABNORMAL HIGH (ref 4.8–5.6)
Mean Plasma Glucose: 225.95 mg/dL

## 2022-03-06 MED ORDER — AMIODARONE HCL 200 MG PO TABS
200.0000 mg | ORAL_TABLET | Freq: Every day | ORAL | Status: DC
  Administered 2022-03-06 – 2022-03-08 (×3): 200 mg via ORAL
  Filled 2022-03-06 (×3): qty 1

## 2022-03-06 MED ORDER — ATORVASTATIN CALCIUM 20 MG PO TABS: 40.0000 mg | ORAL_TABLET | Freq: Every day | ORAL | Status: DC

## 2022-03-06 MED ORDER — HEPARIN BOLUS VIA INFUSION
4000.0000 [IU] | Freq: Once | INTRAVENOUS | Status: AC
  Administered 2022-03-06: 4000 [IU] via INTRAVENOUS
  Filled 2022-03-06: qty 4000

## 2022-03-06 MED ORDER — INSULIN ASPART 100 UNIT/ML IJ SOLN
0.0000 [IU] | Freq: Three times a day (TID) | INTRAMUSCULAR | Status: DC
  Administered 2022-03-07 (×3): 3 [IU] via SUBCUTANEOUS
  Administered 2022-03-08: 5 [IU] via SUBCUTANEOUS
  Administered 2022-03-08: 4 [IU] via SUBCUTANEOUS
  Administered 2022-03-08 – 2022-03-09 (×4): 3 [IU] via SUBCUTANEOUS
  Filled 2022-03-06 (×8): qty 1

## 2022-03-06 MED ORDER — GABAPENTIN 300 MG PO CAPS
300.0000 mg | ORAL_CAPSULE | Freq: Two times a day (BID) | ORAL | Status: DC
  Administered 2022-03-06 – 2022-03-09 (×6): 300 mg via ORAL
  Filled 2022-03-06 (×6): qty 1

## 2022-03-06 MED ORDER — LEVOTHYROXINE SODIUM 50 MCG PO TABS
175.0000 ug | ORAL_TABLET | Freq: Every day | ORAL | Status: DC
  Administered 2022-03-07 – 2022-03-08 (×2): 175 ug via ORAL
  Filled 2022-03-06: qty 4
  Filled 2022-03-06: qty 1

## 2022-03-06 MED ORDER — INSULIN ASPART 100 UNIT/ML IJ SOLN
0.0000 [IU] | Freq: Every day | INTRAMUSCULAR | Status: DC
  Administered 2022-03-08: 2 [IU] via SUBCUTANEOUS
  Filled 2022-03-06: qty 1

## 2022-03-06 MED ORDER — ACETAMINOPHEN 325 MG PO TABS
650.0000 mg | ORAL_TABLET | ORAL | Status: DC | PRN
  Administered 2022-03-07 – 2022-03-09 (×4): 650 mg via ORAL
  Filled 2022-03-06 (×4): qty 2

## 2022-03-06 MED ORDER — ATORVASTATIN CALCIUM 80 MG PO TABS
80.0000 mg | ORAL_TABLET | Freq: Every evening | ORAL | Status: DC
  Administered 2022-03-06 – 2022-03-09 (×4): 80 mg via ORAL
  Filled 2022-03-06: qty 4
  Filled 2022-03-06 (×2): qty 1
  Filled 2022-03-06: qty 4

## 2022-03-06 MED ORDER — ASPIRIN 81 MG PO CHEW
324.0000 mg | CHEWABLE_TABLET | Freq: Once | ORAL | Status: AC
  Administered 2022-03-06: 324 mg via ORAL
  Filled 2022-03-06: qty 4

## 2022-03-06 MED ORDER — HEPARIN (PORCINE) 25000 UT/250ML-% IV SOLN
1300.0000 [IU]/h | INTRAVENOUS | Status: DC
  Administered 2022-03-06: 1300 [IU]/h via INTRAVENOUS
  Filled 2022-03-06: qty 250

## 2022-03-06 MED ORDER — MORPHINE SULFATE (PF) 2 MG/ML IV SOLN: 2.0000 mg | INTRAVENOUS | Status: DC | PRN

## 2022-03-06 MED ORDER — METOPROLOL TARTRATE 50 MG PO TABS
50.0000 mg | ORAL_TABLET | Freq: Two times a day (BID) | ORAL | Status: DC
  Administered 2022-03-06 – 2022-03-09 (×5): 50 mg via ORAL
  Filled 2022-03-06 (×6): qty 1

## 2022-03-06 MED ORDER — RIVAROXABAN 20 MG PO TABS: 20.0000 mg | ORAL_TABLET | Freq: Every day | ORAL | Status: DC

## 2022-03-06 MED ORDER — HYDRALAZINE HCL 50 MG PO TABS
100.0000 mg | ORAL_TABLET | Freq: Two times a day (BID) | ORAL | Status: DC
  Administered 2022-03-07 – 2022-03-09 (×5): 100 mg via ORAL
  Filled 2022-03-06 (×5): qty 2

## 2022-03-06 MED ORDER — SODIUM CHLORIDE 0.9 % IV BOLUS
1000.0000 mL | Freq: Once | INTRAVENOUS | Status: AC
  Administered 2022-03-06: 1000 mL via INTRAVENOUS

## 2022-03-06 MED ORDER — DILTIAZEM HCL 25 MG/5ML IV SOLN
10.0000 mg | Freq: Once | INTRAVENOUS | Status: DC
  Filled 2022-03-06: qty 5

## 2022-03-06 MED ORDER — LOSARTAN POTASSIUM 50 MG PO TABS
100.0000 mg | ORAL_TABLET | Freq: Every day | ORAL | Status: DC
  Administered 2022-03-07: 100 mg via ORAL
  Filled 2022-03-06: qty 2

## 2022-03-06 MED ORDER — ONDANSETRON HCL 4 MG/2ML IJ SOLN: 4.0000 mg | Freq: Four times a day (QID) | INTRAMUSCULAR | Status: DC | PRN

## 2022-03-06 MED ORDER — CHLORTHALIDONE 25 MG PO TABS
25.0000 mg | ORAL_TABLET | Freq: Every day | ORAL | Status: DC
  Administered 2022-03-07: 25 mg via ORAL
  Filled 2022-03-06: qty 1

## 2022-03-06 MED ORDER — NITROGLYCERIN 2 % TD OINT
1.0000 [in_us] | TOPICAL_OINTMENT | Freq: Four times a day (QID) | TRANSDERMAL | Status: AC | PRN
  Administered 2022-03-08 – 2022-03-09 (×3): 1 [in_us] via TOPICAL
  Filled 2022-03-06 (×3): qty 1

## 2022-03-06 NOTE — ED Notes (Signed)
EDP at bedside  

## 2022-03-06 NOTE — Hospital Course (Addendum)
Mr. Craig Reese is a 61 year old male with history of CAD status post PCI to SVG to OM1, heart lipidemia, hypertension, heart failure reduced ejection fraction, severe aortic insufficiency status post Freestyle bioprosthetic aortic valve replacement in 2015, who presents emergency department for chief concerns of chest pain and shortness of breath.  Initial vitals in the emergency department showed temperature of 98.3, respiration rate of 20, heart rate of 122, blood pressure 107/72, SPO2 of 98% on room air.  Serum sodium is 135, potassium 3.5, chloride 97, bicarb 26, BUN of 30, serum creatinine 1.51, GFR 53, nonfasting blood glucose 219, WBC 10.8, hemoglobin 11.9, platelets of 310.  Per ED report, patient received 1 dose of diltiazem via EMS.  Patient initially was found to be in atrial fibrillation with RVR and soft blood pressure.  EDP gave patient sodium chloride 1 L bolus and patient converted to sinus rhythm.  Then diltiazem 10 mg IV one-time dose was given.  Treatment: Aspirin 324 mg p.o. one-time dose, diltiazem 10 mg IV, sodium chloride 1 L bolus.

## 2022-03-06 NOTE — Assessment & Plan Note (Signed)
-   High-sensitivity troponin went from 34-91 - Continue heparin GGT - Complete echo [facility] service has been consulted, Dr. Rockey Situ states the patient will be seen - Admit to telemetry cardiac, inpatient

## 2022-03-06 NOTE — ED Notes (Signed)
Pt transported to xray 

## 2022-03-06 NOTE — Consult Note (Signed)
ANTICOAGULATION CONSULT NOTE - Initial Consult  Pharmacy Consult for heparin Indication: chest pain/ACS  No Known Allergies  Patient Measurements: Height: 5\' 10"  (177.8 cm) Weight: 130 kg (286 lb 9.6 oz) IBW/kg (Calculated) : 73 Heparin Dosing Weight: 102.9 kg  Vital Signs: Temp: 98.3 F (36.8 C) (10/19 1521) Temp Source: Oral (10/19 1521) BP: 120/78 (10/19 1531) Pulse Rate: 66 (10/19 1531)  Labs: Recent Labs    03/06/22 1112 03/06/22 1233 03/06/22 1440  HGB 11.9*  --   --   HCT 39.2  --   --   PLT 310  --   --   CREATININE 1.51*  --   --   TROPONINIHS 34* 48* 91*    Estimated Creatinine Clearance: 71.4 mL/min (A) (by C-G formula based on SCr of 1.51 mg/dL (H)).   Medical History: Past Medical History:  Diagnosis Date   AF (paroxysmal atrial fibrillation) ([place]) 06/06/2018   CAD (coronary artery disease) 05/13/2018   Chronic diastolic CHF (congestive heart failure) ([place]) 11/17/2018   Coronary artery disease    Diabetes mellitus type II, non insulin dependent ([place]) 11/17/2018   Hypothyroidism 11/17/2018    Medications:  No prior chronic anticoagulation from chart review  Assessment: 61 yo M with PMH CAD s/p PCI, HLD, HTN, HFrEF, severe aortic insufficiency s/p bioprosthetic AV replacement (2015) presents with chest pain and SOB c/f NSTEMI. Troponins trending up currently, most recent value 91.  Baseline Labs: aPTT - ordered; INR - ordered Hgb - 11.9; Plts - 310  Date Time aPTT/HL Rate/Comment       Goal of Therapy:  Heparin level 0.3-0.7 units/ml Monitor platelets by anticoagulation protocol: Yes   Plan:  Give 4000 units bolus x1; then start heparin infusion at 1300 units/hr Check anti-Xa level in 6 hours and daily once consecutively therapeutic. Continue to monitor H&H and platelets daily while on heparin gtt.  Dara Hoyer, PharmD PGY-1 Pharmacy Resident 03/06/2022 5:44 PM

## 2022-03-06 NOTE — ED Notes (Signed)
Patient provided with sandwich tray per request as well as water. Pt resting in bed with bed low and locked and side rails raised x2. Call bell in reach and cardiac monitor in place. Pt denies pain or further needs at this time. Pt watching tv.

## 2022-03-06 NOTE — Assessment & Plan Note (Signed)
-   Losartan 100 mg daily, metoprolol tartrate 50 mg p.o. twice daily, chlorthalidone 25 mg daily resumed

## 2022-03-06 NOTE — ED Notes (Addendum)
Resumed care from [employer].  Pt alert.  Pt on oxygen [place].  Iv in place.  Pt in hallway bed.  Pt has intermittent chest pain.  Skin warm and dry   nsr on monitor.

## 2022-03-06 NOTE — ED Notes (Signed)
Patient resting in bed free from sign of distress. Breathing unlabored speaking in full sentences with symmetric chest rise and fall. Bed low and locked with side rails raised x2. Call bell in reach and monitor in place.   

## 2022-03-06 NOTE — ED Notes (Addendum)
EDP at bedside. Orders for repeat EKG. Pt c/o chest pain

## 2022-03-06 NOTE — ED Notes (Signed)
Pt notified of most recent VS.

## 2022-03-06 NOTE — ED Notes (Signed)
Patient resting in bed free from sign of distress. Breathing unlabored speaking in full sentences with symmetric chest rise and fall. Bed low and locked with side rails raised x2. Call bell in reach and monitor in place. Denies chest pain or pressure at this time. Pt watching tv in bed.

## 2022-03-06 NOTE — ED Notes (Signed)
Pt converted - repeat Ekg done. BP improved. MD made aware

## 2022-03-06 NOTE — H&P (Addendum)
History and Physical   Vaughan Mowat [id number] DOB: [birth year] DOA: 03/06/2022  PCP: [facility] [place]  Outpatient Specialists: Dr. Caren Hazy, interventional cardiology, [facility] Patient coming from: [facility] via EMS  I have personally briefly reviewed patient's old medical records in [place].  Chief Concern: Chest pain and shortness of breath  HPI: Mr. Craig Reese is a 61 year old male with history of CAD status post PCI to SVG to OM1, heart lipidemia, hypertension, heart failure reduced ejection fraction, severe aortic insufficiency status post Freestyle bioprosthetic aortic valve replacement in 2015, who presents emergency department for chief concerns of chest pain and shortness of breath.  Initial vitals in the emergency department showed temperature of 98.3, respiration rate of 20, heart rate of 122, blood pressure 107/72, SPO2 of 98% on room air.  Serum sodium is 135, potassium 3.5, chloride 97, bicarb 26, BUN of 30, serum creatinine 1.51, GFR 53, nonfasting blood glucose 219, WBC 10.8, hemoglobin 11.9, platelets of 310.  Per ED report, patient received 1 dose of diltiazem via EMS.  Patient initially was found to be in atrial fibrillation with RVR and soft blood pressure.  EDP gave patient sodium chloride 1 L bolus and patient converted to sinus rhythm.  Then diltiazem 10 mg IV one-time dose was given.  Treatment: Aspirin 324 mg p.o. one-time dose, diltiazem 10 mg IV, sodium chloride 1 L bolus.  Spanish interpreter service via green screen has been used during the H&P Process.  At bedside patient was able to tell me his full name, his age, and he knows he is in the hospital.  He reports that this morning he had chest pain that was in his chest, a 12 out of 10 and persistent.  He states that the pain is minimally reduced in the ED, however did not go away.  He endorses shortness of breath associated with the chest pain.  He denies  jaw discomfort.  He endorses left arm numbness however does not think it is associated with the chest pain at this time.  He reports the pain was so severe that he was afraid he was going to pass out.  Endorses medication compliance.  He denies nausea, vomiting, abdominal pain, dysuria, hematuria, diarrhea, swelling of the lower extremity, syncope.  Social history: He lives at home.  He denies tobacco, EtOH, recreational drug use.  ROS: Constitutional: no weight change, no fever ENT/Mouth: no sore throat, no rhinorrhea Eyes: no eye pain, no vision changes Cardiovascular: + chest pain, + dyspnea,  no edema, no palpitations Respiratory: no cough, no sputum, no wheezing Gastrointestinal: no nausea, no vomiting, no diarrhea, no constipation Genitourinary: no urinary incontinence, no dysuria, no hematuria Musculoskeletal: no arthralgias, no myalgias Skin: no skin lesions, no pruritus, Neuro: + weakness, no loss of consciousness, no syncope Psych: no anxiety, no depression, no decrease appetite Heme/Lymph: no bruising, no bleeding  ED Course: Discussed with emergency medicine provider, patient requiring hospitalization for chief concerns of chest pain and elevated high sensitive troponin.  Assessment/Plan  Principal Problem:   NSTEMI (non-ST elevated myocardial infarction) ([place]) Active Problems:   CAD (coronary artery disease)   Chest pain   Type 2 diabetes mellitus with hyperlipidemia (H[facility])   Hypothyroidism   HLD (hyperlipidemia)   Elevated troponin   HTN (hypertension)   AF (paroxysmal atrial fibrillation) ([facility])   Assessment and Plan:  * NSTEMI (non-ST elevated myocardial infarction) ([place]) - High-sensitivity troponin went from 34-91 - Continue heparin GGT - Complete echo -  [place] service has been consulted, Dr. Rockey Situ states the patient will be seen - Admit to telemetry cardiac, inpatient  Chest pain - Nitroglycerin ointment, 1 inch, every 6 hours as needed for chest pain -  Morphine 2 mg IV every 4 hours as needed for severe pain, 4 doses ordered  CAD (coronary artery disease) - Patient is status post CABG in 2015 and PCI at SVG to [place] in 2019 - Patient had a left heart cath in August 23, 2020 at [facility] and was read as: Diffuse moderate CAD without focal high-grade stenosis.  Patent SVG to OM1 with 40 to 50% proximal stenosis.  Recommendation at that time was aggressive secondary prevention and medical therapy. - Resumed home atorvastatin 40 mg daily  AF (paroxysmal atrial fibrillation) ([place]) - Home Xarelto has been held on admission as patient is on heparin gtt. - Resumed home amiodarone 200 mg nightly  HTN (hypertension) - Losartan 100 mg daily, metoprolol tartrate 50 mg p.o. twice daily, chlorthalidone 25 mg daily resumed  HLD (hyperlipidemia) - Atorvastatin 80 mg nightly resumed  Hypothyroidism - Levothyroxine 175 mcg daily before breakfast resumed  Type 2 diabetes mellitus with hyperlipidemia (HCC) - Insulin SSI with at bedtime coverage ordered - Home metformin has not been resumed on admission - Goal inpatient blood glucose levels 140-180  Chart reviewed.   DVT prophylaxis: Heparin GGT Code Status: Full code Diet: Heart healthy Family Communication: No Disposition Plan: Pending clinical course Consults called: Cardiology Admission status: Telemetry cardiac, inpatient  Past Medical History:  Diagnosis Date   AF (paroxysmal atrial fibrillation) ([place]) 06/06/2018   CAD (coronary artery disease) 05/13/2018   Chronic diastolic CHF (congestive heart failure) ([place]) 11/17/2018   Coronary artery disease    Diabetes mellitus type II, non insulin dependent ([place]) 11/17/2018   Hypothyroidism 11/17/2018   Past Surgical History:  Procedure Laterality Date   CORONARY ARTERY BYPASS GRAFT     LEFT HEART CATH AND CORS/GRAFTS ANGIOGRAPHY N/A 05/17/2018   Procedure: LEFT HEART CATH AND CORS/GRAFTS ANGIOGRAPHY and possible PCI and stent;  Surgeon:  Yolonda Kida, MD;  Location: [facility];  Service: Cardiovascular;  Laterality: N/A;   Social History:  reports that he has never smoked. He has never used smokeless tobacco. He reports that he does not currently use alcohol. He reports that he does not use drugs.  No Known Allergies Family History  Problem Relation Age of Onset   Heart disease Brother    Family history: Family history reviewed and not pertinent.  Prior to Admission medications   Medication Sig Start Date End Date Taking? Authorizing Provider  acetaminophen (TYLENOL) 500 MG tablet Take 500 mg by mouth every 6 (six) hours as needed for mild pain or fever.     [provider]  amiodarone (PACERONE) 200 MG tablet One tab po twice a day for one week then one tablet daily afterwards 04/08/20   Loletha Grayer, MD  atorvastatin (LIPITOR) 40 MG tablet Take 1 tablet (40 mg total) by mouth daily at 6 PM. 11/21/18   Elgergawy, Silver Huguenin, MD  furosemide (LASIX) 40 MG tablet Take 1 tablet (40 mg total) by mouth daily. 11/21/18   Elgergawy, Silver Huguenin, MD  metFORMIN (GLUCOPHAGE) 500 MG tablet Take 2 tablets (1,000 mg total) by mouth 2 (two) times daily. 11/21/18 05/29/20  Elgergawy, Silver Huguenin, MD  metoprolol tartrate (LOPRESSOR) 50 MG tablet Take 1 tablet (50 mg total) by mouth 2 (two) times daily. 11/21/18   Elgergawy,  Silver Huguenin, MD  omeprazole (PRILOSEC) 20 MG capsule Take 2 capsules (40 mg total) by mouth daily. 11/21/18   Elgergawy, Silver Huguenin, MD  rivaroxaban (XARELTO) 20 MG TABS tablet Take 1 tablet (20 mg total) by mouth daily. 04/08/20 05/08/20  Loletha Grayer, MD  SYNTHROID 200 MCG tablet Take 200 mcg by mouth daily. 03/24/20   [provider]   Physical Exam: Vitals:   03/06/22 1411 03/06/22 1507 03/06/22 1521 03/06/22 1531  BP:  (!) 144/84 123/78 120/78  Pulse: 72 70 73 66  Resp: 18 18 16 18   Temp:   98.3 F (36.8 C)   TempSrc:   Oral   SpO2: 100% 99% 98% 99%   Constitutional: appears older than  chronological age, chronically ill, NAD, calm, comfortable Eyes: PERRL, lids and conjunctivae normal ENMT: Mucous membranes are moist. Posterior pharynx clear of any exudate or lesions. Age-appropriate dentition. Hearing appropriate Neck: normal, supple, no masses, no thyromegaly Respiratory: clear to auscultation bilaterally, no wheezing, no crackles. Normal respiratory effort. No accessory muscle use.  Cardiovascular: Regular rate and rhythm, no murmurs / rubs / gallops. No extremity edema. 2+ pedal pulses. No carotid bruits.  Abdomen: Morbidly obese abdomen, no tenderness, no masses palpated, no hepatosplenomegaly. Bowel sounds positive.  Musculoskeletal: no clubbing / cyanosis. No joint deformity upper and lower extremities. Good ROM, no contractures, no atrophy. Normal muscle tone.  Skin: no rashes, lesions, ulcers. No induration Neurologic: Sensation intact. Strength 5/5 in all 4.  Psychiatric: Normal judgment and insight. Alert and oriented x 3. Normal mood.   EKG: independently reviewed, showing sinus rhythm with rate of 69, QTc 501, right bundle branch block  Chest x-ray on Admission: I personally reviewed and I agree with radiologist reading as below.  DG Chest 2 View  Result Date: 03/06/2022 CLINICAL DATA:  Chest pain EXAM: CHEST - 2 VIEW COMPARISON:  04/07/2020 FINDINGS: No pleural effusion. No pneumothorax. Postsurgical changes from median sternotomy and CABG. Unchanged appearance of the sternotomy wires without evidence of new wire fractures. Atherosclerotic calcifications of the aortic arch. Surgical clips in the lower neck and thoracic inlet. Unchanged cardiac and mediastinal contours. No displaced rib fractures. Visualized upper abdomen is unremarkable. Degenerative changes of the left AC joint. Vertebral body heights are maintained. IMPRESSION: No radiographic finding to explain chest pain. Electronically Signed   By: Marin Roberts M.D.   On: 03/06/2022 11:47    Labs on  Admission: I have personally reviewed following labs  CBC: Recent Labs  Lab 03/06/22 1112  WBC 10.8*  HGB 11.9*  HCT 39.2  MCV 81.8  PLT 99991111   Basic Metabolic Panel: Recent Labs  Lab 03/06/22 1112  NA 135  K 3.5  CL 97*  CO2 26  GLUCOSE 219*  BUN 30*  CREATININE 1.51*  CALCIUM 9.3   GFR: CrCl cannot be calculated (Unknown ideal weight.).  Urine analysis:    Component Value Date/Time   COLORURINE Yellow 09/22/2013 2248   APPEARANCEUR Clear 09/22/2013 2248   LABSPEC >1.060 09/22/2013 2248   PHURINE 5.0 09/22/2013 2248   GLUCOSEU Negative 09/22/2013 2248   HGBUR 1+ 09/22/2013 2248   BILIRUBINUR Negative 09/22/2013 2248   KETONESUR Negative 09/22/2013 2248   PROTEINUR Negative 09/22/2013 2248   NITRITE Negative 09/22/2013 2248   LEUKOCYTESUR Negative 09/22/2013 2248   CRITICAL CARE Performed by: Dr. Tobie Poet  Total critical care time: 35 minutes  Critical care time was exclusive of separately billable procedures and treating other patients.  Critical care was necessary  to treat or prevent imminent or life-threatening deterioration.  Critical care was time spent personally by me on the following activities: development of treatment plan with patient and/or surrogate as well as nursing, discussions with consultants, evaluation of patient's response to treatment, examination of patient, obtaining history from patient or surrogate, ordering and performing treatments and interventions, ordering and review of laboratory studies, ordering and review of radiographic studies, pulse oximetry and re-evaluation of patient's condition.  Dr. Tobie Poet [facility]  If 7PM-7AM, please contact overnight-coverage provider If 7AM-7PM, please contact day coverage provider [email or web address]  03/06/2022, 5:26 PM

## 2022-03-06 NOTE — ED Triage Notes (Signed)
Pt to ED via [facility] from cousin's house. Initial dispatch was for SOB. Family states pt's lips appeared blue. EMS states pt in afib RVR on arrival with rate between 140-160. Pt given 250cc NS and 10mg  Cardizem in route. HR currently 90-110s on arrival.   Pt with hx CABG. Pt spanish speaking only.

## 2022-03-06 NOTE — ED Notes (Signed)
Pt BP now increased, HR improved with changing rhythm.

## 2022-03-06 NOTE — Assessment & Plan Note (Signed)
-   Atorvastatin 80 mg nightly resumed ?

## 2022-03-06 NOTE — ED Provider Notes (Signed)
[facility] Provider Note    Event Date/Time   First MD Initiated Contact with Patient 03/06/22 1104     (approximate)   History   Afib RVR    HPI  Craig Reese is a 61 y.o. male  who presents to the emergency department today because of concern for chest pain. Pain is located in the middle part of his chest. Started this morning when he was talking on the phone. Was accompanied by shortness of breath. The patient says it has happened to him in the past, last time was a couple of years ago. He was given medication by EMS to help slow his heart rate and he says that he does feel better after that. The patient denies any recent illness.       Physical Exam   Triage Vital Signs: ED Triage Vitals [03/06/22 1110]  Enc Vitals Group     BP (!) 129/100     Pulse Rate (!) 115     Resp 18     Temp 98.3 F (36.8 C)     Temp Source Oral     SpO2 98 %     Weight      Height      Head Circumference      Peak Flow      Pain Score 0     Pain Loc      Pain Edu?      Excl. in GC?     Most recent vital signs: Vitals:   03/06/22 1110 03/06/22 1111  BP: (!) 129/100 (!) 129/100  Pulse: (!) 115   Resp: 18   Temp: 98.3 F (36.8 C)   SpO2: 98%     General: Awake, alert, oriented. CV:  Good peripheral perfusion. Tachycardia, irregular rhythm. Resp:  Normal effort. Lungs clear. Abd:  No distention.     ED Results / Procedures / Treatments   Labs (all labs ordered are listed, but only abnormal results are displayed) Labs Reviewed  BASIC METABOLIC PANEL - Abnormal; Notable for the following components:      Result Value   Chloride 97 (*)    Glucose, Bld 219 (*)    BUN 30 (*)    Creatinine, Ser 1.51 (*)    GFR, Estimated 53 (*)    All other components within normal limits  CBC - Abnormal; Notable for the following components:   WBC 10.8 (*)    Hemoglobin 11.9 (*)    MCH 24.8 (*)    RDW 15.7 (*)    All other components within normal limits   TROPONIN I (HIGH SENSITIVITY) - Abnormal; Notable for the following components:   Troponin I (High Sensitivity) 34 (*)    All other components within normal limits     EKG  I, Phineas Semen, attending physician, personally viewed and interpreted this EKG  EKG Time: 1101 Rate: 96 Rhythm: atrial fibrillation Axis: left axis deviation Intervals: qtc 535 QRS: RBBB ST changes: no st elevation Impression: abnormal ekg   RADIOLOGY I independently interpreted and visualized the CXR. My interpretation: No pneumonia Radiology interpretation:  IMPRESSION:  No radiographic finding to explain chest pain.     PROCEDURES:  Critical Care performed: No  Procedures   MEDICATIONS ORDERED IN ED: Medications - No data to display   IMPRESSION / MDM / ASSESSMENT AND PLAN / ED COURSE  I reviewed the triage vital signs and the nursing notes.  Differential diagnosis includes, but is not limited to, afib with rvr, acs, pneumonia.   Patient's presentation is most consistent with acute presentation with potential threat to life or bodily function.  Patient presents to the emergency department because of concerns for chest pain.  Patient's initial EKG showed A-fib with RVR.  Had already received diltiazem by EMS.  Patient was found to be hypotensive here.  Patient was started on IV fluids prior to any further dose of diltiazem.  He then converted to a normal sinus rhythm.  However he continued to complain of some chest pain.  Initial troponin was slightly elevated but not significantly different from patient's apparent baseline.  Repeat troponin was slightly more elevated.  Did obtain a third troponin which almost doubled.  Given continued discomfort will plan on admission.   FINAL CLINICAL IMPRESSION(S) / ED DIAGNOSES   Final diagnoses:  Chest pain, unspecified type  Atrial fibrillation with RVR ([place])     Note:  This document was prepared using Dragon  voice recognition software and may include unintentional dictation errors.    [name], MD 03/06/22 1535

## 2022-03-06 NOTE — ED Notes (Addendum)
2L [place] placed on patient due to chest pain and to increase comfort with work of breathing.

## 2022-03-06 NOTE — Assessment & Plan Note (Addendum)
-   Home Xarelto has been held on admission as patient is on heparin gtt. - Resumed home amiodarone 200 mg nightly

## 2022-03-06 NOTE — Assessment & Plan Note (Signed)
-   Nitroglycerin ointment, 1 inch, every 6 hours as needed for chest pain - Morphine 2 mg IV every 4 hours as needed for severe pain, 4 doses ordered

## 2022-03-06 NOTE — Assessment & Plan Note (Signed)
-   Insulin SSI with at bedtime coverage ordered - Home metformin has not been resumed on admission - Goal inpatient blood glucose levels 140-180

## 2022-03-06 NOTE — Assessment & Plan Note (Signed)
-   Patient is status post CABG in 2015 and PCI at SVG to [place] in 2019 - Patient had a left heart cath in August 23, 2020 at [facility] and was read as: Diffuse moderate CAD without focal high-grade stenosis.  Patent SVG to OM1 with 40 to 50% proximal stenosis.  Recommendation at that time was aggressive secondary prevention and medical therapy. - Resumed home atorvastatin 40 mg daily

## 2022-03-06 NOTE — Assessment & Plan Note (Signed)
Levothyroxine  

## 2022-03-07 ENCOUNTER — Inpatient Hospital Stay (HOSPITAL_COMMUNITY)
Admit: 2022-03-07 | Discharge: 2022-03-07 | Disposition: A | Discharge status: Home / Self Care | Payer: Self-pay | Attending: Internal Medicine | Admitting: Internal Medicine

## 2022-03-07 DIAGNOSIS — I480 Paroxysmal atrial fibrillation: Secondary | ICD-10-CM

## 2022-03-07 DIAGNOSIS — N189 Chronic kidney disease, unspecified: Secondary | ICD-10-CM | POA: Diagnosis present

## 2022-03-07 DIAGNOSIS — R0609 Other forms of dyspnea: Secondary | ICD-10-CM

## 2022-03-07 DIAGNOSIS — I25119 Atherosclerotic heart disease of native coronary artery with unspecified angina pectoris: Secondary | ICD-10-CM

## 2022-03-07 DIAGNOSIS — I4891 Unspecified atrial fibrillation: Secondary | ICD-10-CM

## 2022-03-07 DIAGNOSIS — N179 Acute kidney failure, unspecified: Secondary | ICD-10-CM | POA: Diagnosis present

## 2022-03-07 LAB — CBC
HCT: 31.2 % — ABNORMAL LOW (ref 39.0–52.0)
Hemoglobin: 9.4 g/dL — ABNORMAL LOW (ref 13.0–17.0)
MCH: 25 pg — ABNORMAL LOW (ref 26.0–34.0)
MCHC: 30.1 g/dL (ref 30.0–36.0)
MCV: 83 fL (ref 80.0–100.0)
Platelets: 266 10*3/uL (ref 150–400)
RBC: 3.76 MIL/uL — ABNORMAL LOW (ref 4.22–5.81)
RDW: 15.9 % — ABNORMAL HIGH (ref 11.5–15.5)
WBC: 10.7 10*3/uL — ABNORMAL HIGH (ref 4.0–10.5)
nRBC: 0 % (ref 0.0–0.2)

## 2022-03-07 LAB — CBG MONITORING, ED
Glucose-Capillary: 175 mg/dL — ABNORMAL HIGH (ref 70–99)
Glucose-Capillary: 155 mg/dL — ABNORMAL HIGH (ref 70–99)
Glucose-Capillary: 151 mg/dL — ABNORMAL HIGH (ref 70–99)

## 2022-03-07 LAB — BASIC METABOLIC PANEL
Anion gap: 7 (ref 5–15)
BUN: 32 mg/dL — ABNORMAL HIGH (ref 6–20)
CO2: 28 mmol/L (ref 22–32)
Calcium: 8.6 mg/dL — ABNORMAL LOW (ref 8.9–10.3)
Chloride: 104 mmol/L (ref 98–111)
Creatinine, Ser: 1.56 mg/dL — ABNORMAL HIGH (ref 0.61–1.24)
GFR, Estimated: 51 mL/min — ABNORMAL LOW (ref >60)
Glucose, Bld: 180 mg/dL — ABNORMAL HIGH (ref 70–99)
Potassium: 3.6 mmol/L (ref 3.5–5.1)
Sodium: 139 mmol/L (ref 135–145)

## 2022-03-07 LAB — APTT
aPTT: 46 seconds — ABNORMAL HIGH (ref 24–36)
aPTT: 33 seconds (ref 24–36)

## 2022-03-07 LAB — HEPARIN LEVEL (UNFRACTIONATED)
Heparin Unfractionated: 0.43 IU/mL (ref 0.30–0.70)
Heparin Unfractionated: 1.05 IU/mL — ABNORMAL HIGH (ref 0.30–0.70)
Heparin Unfractionated: >1.1 IU/mL — ABNORMAL HIGH (ref 0.30–0.70)

## 2022-03-07 LAB — PROTIME-INR
INR: 1.3 — ABNORMAL HIGH (ref 0.8–1.2)
Prothrombin Time: 16.5 seconds — ABNORMAL HIGH (ref 11.4–15.2)

## 2022-03-07 LAB — GLUCOSE, CAPILLARY: Glucose-Capillary: 154 mg/dL — ABNORMAL HIGH (ref 70–99)

## 2022-03-07 MED ORDER — HEPARIN (PORCINE) 25000 UT/250ML-% IV SOLN
1100.0000 [IU]/h | INTRAVENOUS | Status: DC
  Administered 2022-03-07: 1100 [IU]/h via INTRAVENOUS

## 2022-03-07 MED ORDER — HYDRALAZINE HCL 20 MG/ML IJ SOLN: 10.0000 mg | Freq: Four times a day (QID) | INTRAMUSCULAR | Status: DC | PRN

## 2022-03-07 MED ORDER — INSULIN ASPART 100 UNIT/ML IJ SOLN
4.0000 [IU] | Freq: Three times a day (TID) | INTRAMUSCULAR | Status: DC
  Administered 2022-03-07 – 2022-03-09 (×8): 4 [IU] via SUBCUTANEOUS
  Filled 2022-03-07 (×8): qty 1

## 2022-03-07 MED ORDER — HEPARIN (PORCINE) 25000 UT/250ML-% IV SOLN
1800.0000 [IU]/h | INTRAVENOUS | Status: AC
  Administered 2022-03-07: 900 [IU]/h via INTRAVENOUS
  Administered 2022-03-08: 1450 [IU]/h via INTRAVENOUS
  Administered 2022-03-09: 1650 [IU]/h via INTRAVENOUS
  Filled 2022-03-07 (×3): qty 250

## 2022-03-07 MED ORDER — PERFLUTREN LIPID MICROSPHERE
1.0000 mL | INTRAVENOUS | Status: AC | PRN
  Administered 2022-03-07: 3 mL via INTRAVENOUS

## 2022-03-07 NOTE — Progress Notes (Signed)
Craig Reese, is a 61 y.o. male, DOB - Sep 29, 1960, [id number] Admit date - 03/06/2022    Outpatient Primary MD for the patient is [facility] [place]  LOS - 1  days  Chief Complaint  Patient presents with   Afib RVR        Brief summary   Patient is a 61 year old male with CAD status post PCI to SVG to OM1, HLP, HTN, HFrEF, severe aortic insufficiency status post Freestyle bioprosthetic aortic valve replacement in 2015, presented to ED with chest pain and shortness of breath.  Patient reported that on the morning of admission, he had chest pain, 10/10, persistent, associated with shortness of breath.  No jaw discomfort however had left arm numbness. Also reported intermittent episodes of dizziness and near syncope.   In ED, initially was found to be in atrial fibrillation with RVR and hypotensive.  Heart rate 122, BP 107/72.  He was given 1 L IV fluid bolus and, required 10 mg IV one-time dose of diltiazem, converted to NSR. Troponin 48-> 91 Cardiology was consulted and patient was admitted for further work-up.  Assessment & Plan    Principal Problem:   NSTEMI (non-ST elevated myocardial infarction) ([place]) in the setting of complex known history of CAD, elevated troponins, chest pain -Patient has known history of CAD status post CABG in 2015, PCI SVG to OM1 and 2019.  Cardiac cath in 08/2020 at [facility] read as diffuse moderate CAD without focal high-grade stenosis.  Patent SVG to OM1 with 40 to 50% proximal stenosis.  Aggressive secondary prevention and medical therapy was recommended -Per patient, chest pain is improving but not resolved yet -Continue IV heparin drip, 2D echo -cardiology (Dr Rockey Situ) consulted, will await recommendations  Active Problems:    AF (paroxysmal atrial fibrillation) ([facility]) -Continue IV heparin drip.  Xarelto on  hold -Continue amiodarone 200 mg daily    Type 2 diabetes mellitus with hyperlipidemia ([place]), poorly controlled with hyperglycemia, NIDDM -Placed on sliding scale insulin while inpatient.  Hold metformin, Invokana -Hemoglobin A1c 9.5 Recent Labs    03/06/22 2131 03/07/22 0718  GLUCAP 154* 175*  -Added NovoLog meal coverage 4 units 3 times daily AC  Acute kidney injury, likely superimposed on CKD stage II -Creatinine 1.27 on 10/23/2021 (Care Everywhere).  Baseline creatinine 1.1-1.27 -Admitted with creatinine of 1.5 -Hold Lasix, chlorthalidone, Invokana, losartan  Near syncopal episodes -During my encounter, patient ported that she he has been having intermittent dizziness and near syncopal episodes for last month  -On admission, was in atrial fibrillation with RVR and hypotensive -Adjust antihypertensive doses upon DC.,  Hold Lasix, chlorthalidone, losartan    Hypothyroidism -TSH 10.3 on 04/07/2020, will repeat -Continue Synthroid    HLD (hyperlipidemia) -Continue Lipitor    HTN (hypertension) -BP stable, continue beta-blocker, p.o. hydralazine. -Added IV hydralazine as needed with parameters  Chronic combined systolic and diastolic CHF, aortic insufficiency status post bioprosthetic AVR -Follow 2D echo, currently Lasix, losartan on hold due to  AKI -Cardiology consulted   Obesity Estimated body mass index is 41.12 kg/m as calculated from the following:   Height as of this encounter: 5\' 10"  (1.778 m).   Weight as of this encounter: 130 kg.  Code Status: Full CODE STATUS DVT Prophylaxis:  Place TED hose Start: 03/06/22 1549   Level of Care: Level of care: Telemetry Cardiac Family Communication: Updated patient.  Used Spanish interpreter, Mickel Baas # [phone]   Disposition Plan:      Remains inpatient appropriate: Awaiting evaluation recommendations by cardiology  Procedures:  None  Consultants:   Cardiology  Antimicrobials: None   Medications  amiodarone  200 mg  Oral QHS   atorvastatin  80 mg Oral QPM   chlorthalidone  25 mg Oral Daily   diltiazem  10 mg Intravenous Once   gabapentin  300 mg Oral BID   hydrALAZINE  100 mg Oral BID   insulin aspart  0-15 Units Subcutaneous TID WC   insulin aspart  0-5 Units Subcutaneous QHS   levothyroxine  175 mcg Oral QAC breakfast   losartan  100 mg Oral Daily   metoprolol tartrate  50 mg Oral BID      Subjective:   Craig Reese was seen and examined today.  Encounter was facilitated by [place] interpreter.  Patient reports chest pain has not fully resolved, has improved significantly.  Has shortness of breath, no fevers or chills or cough.  Also reported intermittent dizzy spells at home for the last month.  No syncopal episodes.    Objective:   Vitals:   03/07/22 0800 03/07/22 0900 03/07/22 1000 03/07/22 1100  BP: 133/76 (!) 150/96 115/73 125/77  Pulse: 63 65 (!) 58 (!) 57  Resp: 14 14 18 18   Temp:   98.4 F (36.9 C)   TempSrc:   Oral   SpO2: 96% 97% 95% 93%  Weight:      Height:        Intake/Output Summary (Last 24 hours) at 03/07/2022 1119 Last data filed at 03/07/2022 0757 Gross per 24 hour  Intake 192.75 ml  Output 800 ml  Net -607.25 ml     Wt Readings from Last 3 Encounters:  03/06/22 130 kg  04/07/20 (!) 138.2 kg  11/21/18 124.9 kg     Exam General: Alert and oriented x 3, NAD Cardiovascular: S1 S2 auscultated,  RRR Respiratory: Clear to auscultation bilaterally, no wheezing Gastrointestinal: Soft, nontender, nondistended, + bowel sounds Ext: no pedal edema bilaterally Neuro: Strength 5/5 upper and lower extremities bilaterally Psych: Normal affect and demeanor    Data Reviewed:  I have personally reviewed following labs    CBC Lab Results  Component Value Date   WBC 10.7 (H) 03/07/2022   RBC 3.76 (L) 03/07/2022   HGB 9.4 (L) 03/07/2022   HCT 31.2 (L) 03/07/2022   MCV 83.0 03/07/2022   MCH 25.0 (L) 03/07/2022   PLT 266 03/07/2022   MCHC 30.1 03/07/2022    RDW 15.9 (H) 03/07/2022   LYMPHSABS 1.9 04/07/2020   MONOABS 0.8 04/07/2020   EOSABS 1.2 (H) 04/07/2020   BASOSABS 0.2 (H) [id number]     Last metabolic panel Lab Results  Component Value Date   NA 139 03/07/2022   K 3.6 03/07/2022   CL 104 03/07/2022   CO2 28 03/07/2022   BUN 32 (H) 03/07/2022   CREATININE 1.56 (H) 03/07/2022   GLUCOSE 180 (H) 03/07/2022   GFRNONAA 51 (L) 03/07/2022   GFRAA >60 11/21/2018   CALCIUM 8.6 (L)  03/07/2022   PHOS 3.0 08/19/2013   PROT 7.8 04/07/2020   ALBUMIN 4.0 04/07/2020   BILITOT 0.6 04/07/2020   ALKPHOS 74 04/07/2020   AST 53 (H) 04/07/2020   ALT 66 (H) 04/07/2020   ANIONGAP 7 03/07/2022    CBG (last 3)  Recent Labs    03/06/22 2131 03/07/22 0718  GLUCAP 154* 175*      Coagulation Profile: Recent Labs  Lab 03/07/22 0504  INR 1.3*     Radiology Studies: I have personally reviewed the imaging studies  DG Chest 2 View  Result Date: 03/06/2022 CLINICAL DATA:  Chest pain EXAM: CHEST - 2 VIEW COMPARISON:  04/07/2020 FINDINGS: No pleural effusion. No pneumothorax. Postsurgical changes from median sternotomy and CABG. Unchanged appearance of the sternotomy wires without evidence of new wire fractures. Atherosclerotic calcifications of the aortic arch. Surgical clips in the lower neck and thoracic inlet. Unchanged cardiac and mediastinal contours. No displaced rib fractures. Visualized upper abdomen is unremarkable. Degenerative changes of the left AC joint. Vertebral body heights are maintained. IMPRESSION: No radiographic finding to explain chest pain. Electronically Signed   By: Marin Roberts M.D.   On: 03/06/2022 11:47       Dashawna Delbridge M.D. [facility] 03/07/2022, 11:19 AM  Available via Epic secure chat 7am-7pm After 7 pm, please refer to night coverage provider listed on amion.

## 2022-03-07 NOTE — Progress Notes (Signed)
*  PRELIMINARY RESULTS* Echocardiogram 2D Echocardiogram has been performed.  Craig Reese 03/07/2022, 12:37 PM

## 2022-03-07 NOTE — ED Notes (Signed)
Breakfast tray delivered at this time.

## 2022-03-07 NOTE — ED Notes (Signed)
Per orders from Pharmacist, Heparin drip stopped at this time and is to remain paused for 1 hour. Drip to resume at 0130.

## 2022-03-07 NOTE — ED Notes (Addendum)
RN called pharmacy to consult about newly resulted Heparin level. Pharmacist reviewing results and states will place appropriate orders for drip titration.

## 2022-03-07 NOTE — ED Notes (Addendum)
Pt c/o headache, see PRN medications

## 2022-03-07 NOTE — ED Notes (Signed)
Patient resting in bed free from sign of distress. Breathing unlabored speaking in full sentences with symmetric chest rise and fall. Bed low and locked with side rails raised x3. Call bell in reach and monitor in place.   

## 2022-03-07 NOTE — ED Notes (Signed)
Glucose checked - 155

## 2022-03-07 NOTE — ED Notes (Signed)
Patient resting in bed free from sign of distress. Breathing unlabored speaking in full sentences with symmetric chest rise and fall. Bed low and locked with side rails raised x3. Call bell in reach and monitor in place.

## 2022-03-07 NOTE — Consult Note (Signed)
ANTICOAGULATION CONSULT NOTE  Pharmacy Consult for heparin Indication: chest pain/ACS  No Known Allergies  Patient Measurements: Height: 5\' 10"  (177.8 cm) Weight: 130 kg (286 lb 9.6 oz) IBW/kg (Calculated) : 73 Heparin Dosing Weight: 102.9 kg  Vital Signs: Temp: 98.7 F (37.1 C) (10/19 2137) Temp Source: Oral (10/19 2137) BP: 140/82 (10/20 0010) Pulse Rate: 71 (10/20 0010)  Labs: Recent Labs    03/06/22 1112 03/06/22 1233 03/06/22 1440 03/06/22 2358  HGB 11.9*  --   --   --   HCT 39.2  --   --   --   PLT 310  --   --   --   HEPARINUNFRC  --   --   --  >1.10*  CREATININE 1.51*  --   --   --   TROPONINIHS 34* 48* 91*  --      Estimated Creatinine Clearance: 71.4 mL/min (A) (by C-G formula based on SCr of 1.51 mg/dL (H)).   Medical History: Past Medical History:  Diagnosis Date   AF (paroxysmal atrial fibrillation) ([place]) 06/06/2018   CAD (coronary artery disease) 05/13/2018   Chronic diastolic CHF (congestive heart failure) ([place]) 11/17/2018   Coronary artery disease    Diabetes mellitus type II, non insulin dependent ([place]) 11/17/2018   Hypothyroidism 11/17/2018    Medications:  No prior chronic anticoagulation from chart review  Assessment: 61 yo M with PMH CAD s/p PCI, HLD, HTN, HFrEF, severe aortic insufficiency s/p bioprosthetic AV replacement (2015) presents with chest pain and SOB c/f NSTEMI. Troponins trending up currently, most recent value 91.  Baseline Labs: aPTT - ordered; INR - ordered Hgb - 11.9; Plts - 310  Date Time aPTT/HL Rate/Comment 10/19 2358 HL >1.1 supratherapeutic     Goal of Therapy:  Heparin level 0.3-0.7 units/ml Monitor platelets by anticoagulation protocol: Yes   Plan:  Contacted RN.  Hold infusion for 1 hour. Restart heparin infusion at 1100 units/hr Recheck HL in 6 hr after restart Continue to monitor H&H and platelets daily while on heparin gtt.  Renda Rolls, PharmD, [facility] 03/07/2022 12:24 AM

## 2022-03-07 NOTE — ED Notes (Signed)
Per pharmacy, reports to stop heparin drip for 1 hour and restart heparin drip at 900u/hr

## 2022-03-07 NOTE — ED Notes (Signed)
Pt given coffee at this time 

## 2022-03-07 NOTE — ED Notes (Signed)
Patient resting in bed free from sign of distress. Breathing unlabored  with symmetric chest rise and fall. Bed low and locked with side rails raised x2. Call bell in reach and monitor in place.   

## 2022-03-07 NOTE — ED Notes (Signed)
Patient resting in bed free from sign of distress. Breathing unlabored  with symmetric chest rise and fall. Bed low and locked with side rails raised x3. Call bell in reach and monitor in place.

## 2022-03-07 NOTE — Consult Note (Signed)
ANTICOAGULATION CONSULT NOTE  Pharmacy Consult for heparin Indication: chest pain/ACS  No Known Allergies  Patient Measurements: Height: 5\' 10"  (177.8 cm) Weight: 130 kg (286 lb 9.6 oz) IBW/kg (Calculated) : 73 Heparin Dosing Weight: 102.9 kg  Vital Signs: Temp: 98.5 F (36.9 C) (10/20 1400) Temp Source: Oral (10/20 1400) BP: 122/80 (10/20 1400) Pulse Rate: 62 (10/20 1400)  Labs: Recent Labs    03/06/22 1112 03/06/22 1233 03/06/22 1440 03/06/22 2358 03/07/22 0504 03/07/22 0717 03/07/22 1621  HGB 11.9*  --   --   --  9.4*  --   --   HCT 39.2  --   --   --  31.2*  --   --   PLT 310  --   --   --  266  --   --   APTT  --   --   --   --   --  46* 33  LABPROT  --   --   --   --  16.5*  --   --   INR  --   --   --   --  1.3*  --   --   HEPARINUNFRC  --   --   --  >1.10*  --  1.05* 0.43  CREATININE 1.51*  --   --   --   --  1.56*  --   TROPONINIHS 34* 48* 91*  --   --   --   --      Estimated Creatinine Clearance: 69.1 mL/min (A) (by C-G formula based on SCr of 1.56 mg/dL (H)).   Medical History: Past Medical History:  Diagnosis Date   AF (paroxysmal atrial fibrillation) ([place]) 06/06/2018   CAD (coronary artery disease) 05/13/2018   Chronic diastolic CHF (congestive heart failure) ([place]) 11/17/2018   Coronary artery disease    Diabetes mellitus type II, non insulin dependent ([place]) 11/17/2018   Hypothyroidism 11/17/2018    Medications:  Xarelto 20 mg PTA -- last dose 10/18 @ 1800  Assessment: 61 yo M with PMH CAD s/p PCI, HLD, HTN, HFrEF, severe aortic insufficiency s/p bioprosthetic AV replacement (2015) presents with chest pain and SOB c/f NSTEMI. Troponins trending up currently, most recent value 91.  Baseline Labs: aPTT - unknown; INR - unknown (1.3 after starting UFH) Hgb - 11.9; Plts - 310  Date Time aPTT/HL  Rate/Comment 10/19 2358 HL >1.1  Supratherapeutic (d/t Xarelto PTA)  10/20  0717 HL 1.05  Supratherapeutic (d/t Xarelto PTA) 10/20 1621 aPTT 33; HL  0.43 Subtherapeutic; not correlating     Goal of Therapy:  Heparin level 0.3-0.7 units/ml Monitor platelets by anticoagulation protocol: Yes   Plan: Pt taking Xarelto 20 mg PTA -- last dose 10/18 @ 1800 Increase heparin infusion to 1200 un/hr Recheck HL and aPTT in 6 hr after restart Once both values are correlating, titrate by HL alone Continue to monitor H&H and platelets daily while on heparin gtt.  Dara Hoyer, PharmD PGY-1 Pharmacy Resident 03/07/2022 6:29 PM

## 2022-03-07 NOTE — Consult Note (Signed)
Craig Reese is a 61 y.o. male  [id number]  Primary Cardiologist: [facility] cardiology Reason for Consultation: atrial fibrillation with RVR  HPI: Patient is a 61 year old male with history of CAD, status post PCI to SVG to OM1, HLD, HTN, HFrEF, severe AS post bioprosthetic valve replacement in 2015. Patient presented to ED yesterday morning complaining of chest pain and shortness of breath.   Review of Systems: chest pain improving per patient, still short of breath.    Past Medical History:  Diagnosis Date   AF (paroxysmal atrial fibrillation) ([place]) 06/06/2018   CAD (coronary artery disease) 05/13/2018   Chronic diastolic CHF (congestive heart failure) ([place]) 11/17/2018   Coronary artery disease    Diabetes mellitus type II, non insulin dependent ([place]) 11/17/2018   Hypothyroidism 11/17/2018    (Not in a hospital admission)     amiodarone  200 mg Oral QHS   atorvastatin  80 mg Oral QPM   diltiazem  10 mg Intravenous Once   gabapentin  300 mg Oral BID   hydrALAZINE  100 mg Oral BID   insulin aspart  0-15 Units Subcutaneous TID WC   insulin aspart  0-5 Units Subcutaneous QHS   insulin aspart  4 Units Subcutaneous TID WC   levothyroxine  175 mcg Oral QAC breakfast   metoprolol tartrate  50 mg Oral BID    Infusions:  heparin 900 Units/hr (03/07/22 0902)    No Known Allergies  Social History   Socioeconomic History   Marital status: Single    Spouse name: Not on file   Number of children: Not on file   Years of education: Not on file   Highest education level: Not on file  Occupational History   Not on file  Tobacco Use   Smoking status: Never   Smokeless tobacco: Never  Vaping Use   Vaping Use: Never used  Substance and Sexual Activity   Alcohol use: Not Currently   Drug use: Never   Sexual activity: Not Currently  Other Topics Concern   Not on file  Social History Narrative   Not on file   Social Determinants of Health   Financial Resource Strain: Medium Risk  (05/13/2018)   Overall Financial Resource Strain (CARDIA)    Difficulty of Paying Living Expenses: Somewhat hard  Food Insecurity: Unknown (05/13/2018)   Hunger Vital Sign    Worried About Running Out of Food in the Last Year: Patient refused    [place] in the Last Year: Patient refused  Transportation Needs: Unknown (05/13/2018)   [place] - Transportation    Lack of Transportation (Medical): Patient refused    Lack of Transportation (Non-Medical): Patient refused  Physical Activity: Unknown (05/13/2018)   Exercise Vital Sign    Days of Exercise per Week: Patient refused    Minutes of Exercise per Session: Patient refused  Stress: Stress Concern Present (05/13/2018)   [place]    Feeling of Stress : To some extent  Social Connections: Not on file  Intimate Partner Violence: Not on file    Family History  Problem Relation Age of Onset   Heart disease Brother     PHYSICAL EXAM: Vitals:   03/07/22 1100 03/07/22 1400  BP: 125/77 122/80  Pulse: (!) 57 62  Resp: 18 20  Temp:  98.5 F (36.9 C)  SpO2: 93% 92%     Intake/Output Summary (Last 24 hours) at 03/07/2022 1714 Last data filed  at 03/07/2022 0757 Gross per 24 hour  Intake 192.75 ml  Output 800 ml  Net -607.25 ml    General:  Well appearing. No respiratory difficulty HEENT: normal Neck: supple. no JVD. Carotids 2+ bilat; no bruits. No lymphadenopathy or thryomegaly appreciated. Cor: PMI nondisplaced. Regular rate & rhythm. No rubs, gallops or murmurs. Lungs: clear Abdomen: soft, nontender, nondistended. No hepatosplenomegaly. No bruits or masses. Good bowel sounds. Extremities: no cyanosis, clubbing, rash, edema Neuro: alert & oriented x 3, cranial nerves grossly intact. moves all 4 extremities w/o difficulty. Affect pleasant.  ECG: sinus rhythm with occasional PVCs, RBBB, non-specific ST changes  Results for orders placed or performed  during the hospital encounter of 03/06/22 (from the past 24 hour(s))  CBG monitoring, ED     Status: Abnormal   Collection Time: 03/06/22  9:31 PM  Result Value Ref Range   Glucose-Capillary 154 (H) 70 - 99 mg/dL  Heparin level (unfractionated)     Status: Abnormal   Collection Time: 03/06/22 11:58 PM  Result Value Ref Range   Heparin Unfractionated >1.10 (H) 0.30 - 0.70 IU/mL  Protime-INR     Status: Abnormal   Collection Time: 03/07/22  5:04 AM  Result Value Ref Range   Prothrombin Time 16.5 (H) 11.4 - 15.2 seconds   INR 1.3 (H) 0.8 - 1.2  CBC     Status: Abnormal   Collection Time: 03/07/22  5:04 AM  Result Value Ref Range   WBC 10.7 (H) 4.0 - 10.5 K/uL   RBC 3.76 (L) 4.22 - 5.81 MIL/uL   Hemoglobin 9.4 (L) 13.0 - 17.0 g/dL   HCT 31.2 (L) 39.0 - 52.0 %   MCV 83.0 80.0 - 100.0 fL   MCH 25.0 (L) 26.0 - 34.0 pg   MCHC 30.1 30.0 - 36.0 g/dL   RDW 15.9 (H) 11.5 - 15.5 %   Platelets 266 150 - 400 K/uL   nRBC 0.0 0.0 - 0.2 %  APTT     Status: Abnormal   Collection Time: 03/07/22  7:17 AM  Result Value Ref Range   aPTT 46 (H) 24 - 36 seconds  Basic metabolic panel     Status: Abnormal   Collection Time: 03/07/22  7:17 AM  Result Value Ref Range   Sodium 139 135 - 145 mmol/L   Potassium 3.6 3.5 - 5.1 mmol/L   Chloride 104 98 - 111 mmol/L   CO2 28 22 - 32 mmol/L   Glucose, Bld 180 (H) 70 - 99 mg/dL   BUN 32 (H) 6 - 20 mg/dL   Creatinine, Ser 1.56 (H) 0.61 - 1.24 mg/dL   Calcium 8.6 (L) 8.9 - 10.3 mg/dL   GFR, Estimated 51 (L) >60 mL/min   Anion gap 7 5 - 15  Heparin level (unfractionated)     Status: Abnormal   Collection Time: 03/07/22  7:17 AM  Result Value Ref Range   Heparin Unfractionated 1.05 (H) 0.30 - 0.70 IU/mL  CBG monitoring, ED     Status: Abnormal   Collection Time: 03/07/22  7:18 AM  Result Value Ref Range   Glucose-Capillary 175 (H) 70 - 99 mg/dL  CBG monitoring, ED     Status: Abnormal   Collection Time: 03/07/22 12:49 PM  Result Value Ref Range    Glucose-Capillary 155 (H) 70 - 99 mg/dL  Heparin level (unfractionated)     Status: None   Collection Time: 03/07/22  4:21 PM  Result Value Ref Range   Heparin Unfractionated 0.43  0.30 - 0.70 IU/mL  APTT     Status: None   Collection Time: 03/07/22  4:21 PM  Result Value Ref Range   aPTT 33 24 - 36 seconds  CBG monitoring, ED     Status: Abnormal   Collection Time: 03/07/22  4:31 PM  Result Value Ref Range   Glucose-Capillary 151 (H) 70 - 99 mg/dL   ECHOCARDIOGRAM COMPLETE  Result Date: 03/07/2022    ECHOCARDIOGRAM REPORT   Patient Name:   Craig Reese Date of Exam: 03/07/2022 Medical Rec #:  [id number]    Height:       70.0 in Accession #:    [id number]   Weight:       286.6 lb Date of Birth:  1960/08/13    BSA:          2.432 m Patient Age:    61 years     BP:           115/73 mmHg Patient Gender: M            HR:           57 bpm. Exam Location:  [facility] Procedure: 2D Echo, Color Doppler, Cardiac Doppler and Intracardiac            Opacification Agent Indications:     R06.00 Dyspnea; Elevated troponin  History:         Patient has prior history of Echocardiogram examinations, most                  recent 04/07/2020. CHF, CAD; Risk Factors:Diabetes.  Sonographer:     Humphrey Rolls Referring Phys:  [id number] AMY N COX Diagnosing Phys: Julien Nordmann MD  Sonographer Comments: Suboptimal apical window and no subcostal window. Image acquisition challenging due to patient body habitus. IMPRESSIONS  1. Left ventricular ejection fraction, by estimation, is 50 to 55%. The left ventricle has low normal function. The left ventricle has no regional wall motion abnormalities. There is moderate left ventricular hypertrophy. Left ventricular diastolic parameters are consistent with Grade II diastolic dysfunction (pseudonormalization).  2. Right ventricular systolic function is low normal. The right ventricular size is normal. Tricuspid regurgitation signal is inadequate for assessing PA pressure.  3. Left atrial size  was mildly dilated.  4. The mitral valve is normal in structure. Mild mitral valve regurgitation. No evidence of mitral stenosis.  5. The aortic valve was not well visualized. Aortic valve regurgitation is not visualized. Aortic valve sclerosis/calcification is present, without any evidence of aortic stenosis.  6. The inferior vena cava is normal in size with greater than 50% respiratory variability, suggesting right atrial pressure of 3 mmHg. FINDINGS  Left Ventricle: Left ventricular ejection fraction, by estimation, is 50 to 55%. The left ventricle has low normal function. The left ventricle has no regional wall motion abnormalities. Definity contrast agent was given IV to delineate the left ventricular endocardial borders. The left ventricular internal cavity size was normal in size. There is moderate left ventricular hypertrophy. Left ventricular diastolic parameters are consistent with Grade II diastolic dysfunction (pseudonormalization). Right Ventricle: The right ventricular size is normal. No increase in right ventricular wall thickness. Right ventricular systolic function is low normal. Tricuspid regurgitation signal is inadequate for assessing PA pressure. Left Atrium: Left atrial size was mildly dilated. Right Atrium: Right atrial size was normal in size. Pericardium: There is no evidence of pericardial effusion. Mitral Valve: The mitral valve is normal in structure. There is mild calcification of the mitral valve  leaflet(s). Mild mitral annular calcification. Mild mitral valve regurgitation. No evidence of mitral valve stenosis. Tricuspid Valve: The tricuspid valve is normal in structure. Tricuspid valve regurgitation is not demonstrated. No evidence of tricuspid stenosis. Aortic Valve: The aortic valve was not well visualized. Aortic valve regurgitation is not visualized. Aortic valve sclerosis/calcification is present, without any evidence of aortic stenosis. Aortic valve mean gradient measures 2.0  mmHg. Aortic valve peak gradient measures 3.4 mmHg. Aortic valve area, by VTI measures 3.85 cm. Pulmonic Valve: The pulmonic valve was normal in structure. Pulmonic valve regurgitation is not visualized. No evidence of pulmonic stenosis. Aorta: The aortic root is normal in size and structure. Venous: The inferior vena cava is normal in size with greater than 50% respiratory variability, suggesting right atrial pressure of 3 mmHg. IAS/Shunts: No atrial level shunt detected by color flow Doppler.  LEFT VENTRICLE PLAX 2D LVIDd:         5.10 cm      Diastology LVIDs:         3.50 cm      LV e' medial:    5.77 cm/s LV PW:         1.60 cm      LV E/e' medial:  17.7 LV IVS:        1.30 cm      LV e' lateral:   5.87 cm/s LVOT diam:     2.30 cm      LV E/e' lateral: 17.4 LV SV:         85 LV SV Index:   35 LVOT Area:     4.15 cm  LV Volumes (MOD) LV vol d, MOD A2C: 76.7 ml LV vol d, MOD A4C: 148.0 ml LV vol s, MOD A2C: 27.5 ml LV vol s, MOD A4C: 44.9 ml LV SV MOD A2C:     49.2 ml LV SV MOD A4C:     148.0 ml LV SV MOD BP:      78.5 ml LEFT ATRIUM             Index LA Vol (A2C):   53.3 ml 21.91 ml/m LA Vol (A4C):   99.6 ml 40.95 ml/m LA Biplane Vol: 74.3 ml 30.55 ml/m  AORTIC VALVE                    PULMONIC VALVE AV Area (Vmax):    3.97 cm     PV Vmax:       0.85 m/s AV Area (Vmean):   3.80 cm     PV Vmean:      59.600 cm/s AV Area (VTI):     3.85 cm     PV VTI:        0.214 m AV Vmax:           91.80 cm/s   PV Peak grad:  2.9 mmHg AV Vmean:          62.900 cm/s  PV Mean grad:  2.0 mmHg AV VTI:            0.221 m AV Peak Grad:      3.4 mmHg AV Mean Grad:      2.0 mmHg LVOT Vmax:         87.70 cm/s LVOT Vmean:        57.500 cm/s LVOT VTI:          0.205 m LVOT/AV VTI ratio: 0.93  AORTA Ao Root diam: 3.40 cm MITRAL VALVE MV Area (  PHT): 2.79 cm     SHUNTS MV Decel Time: 272 msec     Systemic VTI:  0.20 m MV E velocity: 102.00 cm/s  Systemic Diam: 2.30 cm MV A velocity: 78.10 cm/s MV E/A ratio:  1.31 Ida Rogue MD  Electronically signed by Ida Rogue MD Signature Date/Time: 03/07/2022/1:11:46 PM    Final    DG Chest 2 View  Result Date: 03/06/2022 CLINICAL DATA:  Chest pain EXAM: CHEST - 2 VIEW COMPARISON:  04/07/2020 FINDINGS: No pleural effusion. No pneumothorax. Postsurgical changes from median sternotomy and CABG. Unchanged appearance of the sternotomy wires without evidence of new wire fractures. Atherosclerotic calcifications of the aortic arch. Surgical clips in the lower neck and thoracic inlet. Unchanged cardiac and mediastinal contours. No displaced rib fractures. Visualized upper abdomen is unremarkable. Degenerative changes of the left AC joint. Vertebral body heights are maintained. IMPRESSION: No radiographic finding to explain chest pain. Electronically Signed   By: Marin Roberts M.D.   On: 03/06/2022 11:47     ASSESSMENT AND PLAN: Patient resting comfortably in bed. States chest pain still there but improved. Echo today revealed normal EF. Continue amiodarone. Will continue to follow.   Engineer, drilling FNP-C

## 2022-03-07 NOTE — ED Notes (Signed)
Report received from McKenzie, RN.

## 2022-03-07 NOTE — Consult Note (Signed)
ANTICOAGULATION CONSULT NOTE  Pharmacy Consult for heparin Indication: chest pain/ACS  No Known Allergies  Patient Measurements: Height: 5\' 10"  (177.8 cm) Weight: 130 kg (286 lb 9.6 oz) IBW/kg (Calculated) : 73 Heparin Dosing Weight: 102.9 kg  Vital Signs: Temp: 98.4 F (36.9 C) (10/20 0303) Temp Source: Oral (10/20 0303) BP: 133/73 (10/20 0700) Pulse Rate: 57 (10/20 0700)  Labs: Recent Labs    03/06/22 1112 03/06/22 1233 03/06/22 1440 03/06/22 2358 03/07/22 0504 03/07/22 0717  HGB 11.9*  --   --   --  9.4*  --   HCT 39.2  --   --   --  31.2*  --   PLT 310  --   --   --  266  --   APTT  --   --   --   --   --  46*  LABPROT  --   --   --   --  16.5*  --   INR  --   --   --   --  1.3*  --   HEPARINUNFRC  --   --   --  >1.10*  --  1.05*  CREATININE 1.51*  --   --   --   --   --   TROPONINIHS 34* 48* 91*  --   --   --      Estimated Creatinine Clearance: 71.4 mL/min (A) (by C-G formula based on SCr of 1.51 mg/dL (H)).   Medical History: Past Medical History:  Diagnosis Date   AF (paroxysmal atrial fibrillation) ([place]) 06/06/2018   CAD (coronary artery disease) 05/13/2018   Chronic diastolic CHF (congestive heart failure) ([place]) 11/17/2018   Coronary artery disease    Diabetes mellitus type II, non insulin dependent ([place]) 11/17/2018   Hypothyroidism 11/17/2018    Medications:  No prior chronic anticoagulation from chart review  Assessment: 61 yo M with PMH CAD s/p PCI, HLD, HTN, HFrEF, severe aortic insufficiency s/p bioprosthetic AV replacement (2015) presents with chest pain and SOB c/f NSTEMI. Troponins trending up currently, most recent value 91.  Baseline Labs: aPTT - ordered; INR - ordered Hgb - 11.9; Plts - 310  Date Time aPTT/HL Rate/Comment 10/19 2358 HL >1.1 supratherapeutic  10/20  0717 HL 1.05 supratherapeutic    Goal of Therapy:  Heparin level 0.3-0.7 units/ml Monitor platelets by anticoagulation protocol: Yes   Plan:  Contacted RN.  Hold  infusion for 1 hour. Restart heparin infusion at 900 units/hr Recheck HL in 6 hr after restart Continue to monitor H&H and platelets daily while on heparin gtt.  Pearla Dubonnet, PharmD Clinical Pharmacist 03/07/2022 7:59 AM

## 2022-03-07 NOTE — ED Notes (Signed)
Pt transferred to hospital bed to promote comfort. Pt resting in bed, breathing unlabored with symmetric chest rise and fall. Denies pain. Denies needs at this time. Cardiac monitor remains in place and call bell is in reach. Pt bed is low and locked with side rails raised x3.

## 2022-03-08 ENCOUNTER — Inpatient Hospital Stay: Payer: Self-pay

## 2022-03-08 LAB — APTT
aPTT: 40 seconds — ABNORMAL HIGH (ref 24–36)
aPTT: 56 seconds — ABNORMAL HIGH (ref 24–36)
aPTT: 73 seconds — ABNORMAL HIGH (ref 24–36)
aPTT: 75 seconds — ABNORMAL HIGH (ref 24–36)

## 2022-03-08 LAB — BRAIN NATRIURETIC PEPTIDE: B Natriuretic Peptide: 164.5 pg/mL — ABNORMAL HIGH (ref 0.0–100.0)

## 2022-03-08 LAB — BASIC METABOLIC PANEL
Anion gap: 8 (ref 5–15)
BUN: 31 mg/dL — ABNORMAL HIGH (ref 6–20)
CO2: 25 mmol/L (ref 22–32)
Calcium: 8.6 mg/dL — ABNORMAL LOW (ref 8.9–10.3)
Chloride: 104 mmol/L (ref 98–111)
Creatinine, Ser: 1.5 mg/dL — ABNORMAL HIGH (ref 0.61–1.24)
GFR, Estimated: 53 mL/min — ABNORMAL LOW (ref >60)
Glucose, Bld: 174 mg/dL — ABNORMAL HIGH (ref 70–99)
Potassium: 3.4 mmol/L — ABNORMAL LOW (ref 3.5–5.1)
Sodium: 137 mmol/L (ref 135–145)

## 2022-03-08 LAB — CBC
HCT: 29.6 % — ABNORMAL LOW (ref 39.0–52.0)
Hemoglobin: 9.1 g/dL — ABNORMAL LOW (ref 13.0–17.0)
MCH: 25.2 pg — ABNORMAL LOW (ref 26.0–34.0)
MCHC: 30.7 g/dL (ref 30.0–36.0)
MCV: 82 fL (ref 80.0–100.0)
Platelets: 254 10*3/uL (ref 150–400)
RBC: 3.61 MIL/uL — ABNORMAL LOW (ref 4.22–5.81)
RDW: 15.9 % — ABNORMAL HIGH (ref 11.5–15.5)
WBC: 10.2 10*3/uL (ref 4.0–10.5)
nRBC: 0 % (ref 0.0–0.2)

## 2022-03-08 LAB — HIV ANTIBODY (ROUTINE TESTING W REFLEX): HIV Screen 4th Generation wRfx: NONREACTIVE

## 2022-03-08 LAB — GLUCOSE, CAPILLARY
Glucose-Capillary: 172 mg/dL — ABNORMAL HIGH (ref 70–99)
Glucose-Capillary: 210 mg/dL — ABNORMAL HIGH (ref 70–99)
Glucose-Capillary: 159 mg/dL — ABNORMAL HIGH (ref 70–99)
Glucose-Capillary: 201 mg/dL — ABNORMAL HIGH (ref 70–99)

## 2022-03-08 LAB — T4, FREE: Free T4: 0.73 ng/dL (ref 0.61–1.12)

## 2022-03-08 LAB — HEPARIN LEVEL (UNFRACTIONATED): Heparin Unfractionated: 0.44 IU/mL (ref 0.30–0.70)

## 2022-03-08 LAB — TSH: TSH: 24.43 u[IU]/mL — ABNORMAL HIGH (ref 0.350–4.500)

## 2022-03-08 MED ORDER — HEPARIN BOLUS VIA INFUSION
3100.0000 [IU] | Freq: Once | INTRAVENOUS | Status: AC
  Administered 2022-03-08: 3100 [IU] via INTRAVENOUS
  Filled 2022-03-08: qty 3100

## 2022-03-08 MED ORDER — SPIRONOLACTONE 25 MG PO TABS
25.0000 mg | ORAL_TABLET | Freq: Every day | ORAL | Status: DC
  Administered 2022-03-08 – 2022-03-09 (×2): 25 mg via ORAL
  Filled 2022-03-08 (×2): qty 1

## 2022-03-08 MED ORDER — LEVOTHYROXINE SODIUM 100 MCG PO TABS
200.0000 ug | ORAL_TABLET | Freq: Every day | ORAL | Status: DC
  Administered 2022-03-09: 200 ug via ORAL
  Filled 2022-03-08: qty 2

## 2022-03-08 MED ORDER — HEPARIN BOLUS VIA INFUSION
1500.0000 [IU] | Freq: Once | INTRAVENOUS | Status: AC
  Administered 2022-03-08: 1500 [IU] via INTRAVENOUS
  Filled 2022-03-08: qty 1500

## 2022-03-08 NOTE — Consult Note (Signed)
ANTICOAGULATION CONSULT NOTE  Pharmacy Consult for heparin Indication: chest pain/ACS  No Known Allergies  Patient Measurements: Height: 5\' 10"  (177.8 cm) Weight: 130 kg (286 lb 9.6 oz) IBW/kg (Calculated) : 73 Heparin Dosing Weight: 102.9 kg  Vital Signs: Temp: 98.2 F (36.8 C) (10/21 1150) Temp Source: Oral (10/21 0408) BP: 144/77 (10/21 1150) Pulse Rate: 64 (10/21 1150)  Labs: Recent Labs    03/06/22 1112 03/06/22 1233 03/06/22 1440 03/06/22 2358 03/07/22 0504 03/07/22 0717 03/07/22 1621 03/08/22 0014 03/08/22 0426 03/08/22 0835 03/08/22 1417  HGB 11.9*  --   --   --  9.4*  --   --   --  9.1*  --   --   HCT 39.2  --   --   --  31.2*  --   --   --  29.6*  --   --   PLT 310  --   --   --  266  --   --   --  254  --   --   APTT  --   --   --    < >  --  46* 33 40*  --  73* 56*  LABPROT  --   --   --   --  16.5*  --   --   --   --   --   --   INR  --   --   --   --  1.3*  --   --   --   --   --   --   HEPARINUNFRC  --   --   --    < >  --  1.05* 0.43 0.44  --   --   --   CREATININE 1.51*  --   --   --   --  1.56*  --   --  1.50*  --   --   TROPONINIHS 34* 48* 91*  --   --   --   --   --   --   --   --    < > = values in this interval not displayed.     Estimated Creatinine Clearance: 71.9 mL/min (A) (by C-G formula based on SCr of 1.5 mg/dL (H)).   Medical History: Past Medical History:  Diagnosis Date   AF (paroxysmal atrial fibrillation) ([place]) 06/06/2018   CAD (coronary artery disease) 05/13/2018   Chronic diastolic CHF (congestive heart failure) ([place]) 11/17/2018   Coronary artery disease    Diabetes mellitus type II, non insulin dependent ([place]) 11/17/2018   Hypothyroidism 11/17/2018    Medications:  Xarelto 20 mg PTA -- last dose 10/18 @ 1800  Assessment: 61 yo M with PMH CAD s/p PCI, HLD, HTN, HFrEF, severe aortic insufficiency s/p bioprosthetic AV replacement (2015) presents with chest pain and SOB c/f NSTEMI. Troponins trending up currently, most recent  value 91.  Baseline Labs: aPTT - unknown; INR - unknown (1.3 after starting UFH) Hgb - 11.9; Plts - 310  Date Time aPTT/HL  Rate/Comment 10/19 2358 HL >1.1  Supratherapeutic (d/t Xarelto PTA)  10/20  0717 HL 1.05  Supratherapeutic (d/t Xarelto PTA) 10/20 1621 aPTT 33; HL 0.43 Subtherapeutic; not correlating 10/21  0835 aPTT 73  Therapeutic x 1 10/21  1417 aPTT 56  Subtherapeutic      Goal of Therapy:  Heparin level 0.3-0.7 units/ml aPTT: 66-102 sec Monitor platelets by anticoagulation protocol: Yes   Plan: Pt taking Xarelto 20 mg PTA --  last dose 10/18 @ 1800 1500 unit bolus x 1 Continue heparin infusion at 1650 un/hr Recheck aPTT in 6 hrs after rate change Once both values are correlating, titrate by HL alone Continue to monitor H&H and platelets daily while on heparin gtt.  Bettey Costa, PharmD Clinical Pharmacist 03/08/2022 3:22 PM

## 2022-03-08 NOTE — Consult Note (Signed)
ANTICOAGULATION CONSULT NOTE  Pharmacy Consult for heparin Indication: chest pain/ACS  No Known Allergies  Patient Measurements: Height: 5\' 10"  (177.8 cm) Weight: 130 kg (286 lb 9.6 oz) IBW/kg (Calculated) : 73 Heparin Dosing Weight: 102.9 kg  Vital Signs: Temp: 97.6 F (36.4 C) (10/21 0758) Temp Source: Oral (10/21 0408) BP: 117/72 (10/21 0758) Pulse Rate: 62 (10/21 0758)  Labs: Recent Labs    03/06/22 1112 03/06/22 1233 03/06/22 1440 03/06/22 2358 03/07/22 0504 03/07/22 0717 03/07/22 1621 03/08/22 0014 03/08/22 0426 03/08/22 0835  HGB 11.9*  --   --   --  9.4*  --   --   --  9.1*  --   HCT 39.2  --   --   --  31.2*  --   --   --  29.6*  --   PLT 310  --   --   --  266  --   --   --  254  --   APTT  --   --   --    < >  --  46* 33 40*  --  73*  LABPROT  --   --   --   --  16.5*  --   --   --   --   --   INR  --   --   --   --  1.3*  --   --   --   --   --   HEPARINUNFRC  --   --   --    < >  --  1.05* 0.43 0.44  --   --   CREATININE 1.51*  --   --   --   --  1.56*  --   --  1.50*  --   TROPONINIHS 34* 48* 91*  --   --   --   --   --   --   --    < > = values in this interval not displayed.     Estimated Creatinine Clearance: 71.9 mL/min (A) (by C-G formula based on SCr of 1.5 mg/dL (H)).   Medical History: Past Medical History:  Diagnosis Date   AF (paroxysmal atrial fibrillation) ([facility]) 06/06/2018   CAD (coronary artery disease) 05/13/2018   Chronic diastolic CHF (congestive heart failure) ([facility]) 11/17/2018   Coronary artery disease    Diabetes mellitus type II, non insulin dependent ([facility]) 11/17/2018   Hypothyroidism 11/17/2018    Medications:  Xarelto 20 mg PTA -- last dose 10/18 @ 1800  Assessment: 61 yo M with PMH CAD s/p PCI, HLD, HTN, HFrEF, severe aortic insufficiency s/p bioprosthetic AV replacement (2015) presents with chest pain and SOB c/f NSTEMI. Troponins trending up currently, most recent value 91.  Baseline Labs: aPTT - unknown; INR - unknown  (1.3 after starting UFH) Hgb - 11.9; Plts - 310  Date Time aPTT/HL  Rate/Comment 10/19 2358 HL >1.1  Supratherapeutic (d/t Xarelto PTA)  10/20  0717 HL 1.05  Supratherapeutic (d/t Xarelto PTA) 10/20 1621 aPTT 33; HL 0.43 Subtherapeutic; not correlating 10/21  0835 aPTT 73  Therapeutic x 1      Goal of Therapy:  Heparin level 0.3-0.7 units/ml aPTT: 66-102 sec Monitor platelets by anticoagulation protocol: Yes   Plan: Pt taking Xarelto 20 mg PTA -- last dose 10/18 @ 1800 Therapeutic x 1 Continue heparin infusion at 1200 un/hr Recheck confirmatory aPTT in 6 hrs Once both values are correlating, titrate by HL alone Continue to monitor H&H  and platelets daily while on heparin gtt.  Pearla Dubonnet, PharmD Clinical Pharmacist 59/21/2023 9:56 AM

## 2022-03-08 NOTE — Consult Note (Signed)
ANTICOAGULATION CONSULT NOTE  Pharmacy Consult for heparin Indication: chest pain/ACS  No Known Allergies  Patient Measurements: Height: 5\' 10"  (177.8 cm) Weight: 130 kg (286 lb 9.6 oz) IBW/kg (Calculated) : 73 Heparin Dosing Weight: 102.9 kg  Vital Signs: Temp: 98.8 F (37.1 C) (10/21 0001) Temp Source: Oral (10/21 0001) BP: 119/61 (10/21 0001) Pulse Rate: 68 (10/21 0001)  Labs: Recent Labs    03/06/22 1112 03/06/22 1233 03/06/22 1440 03/06/22 2358 03/07/22 0504 03/07/22 0717 03/07/22 1621 03/08/22 0014  HGB 11.9*  --   --   --  9.4*  --   --   --   HCT 39.2  --   --   --  31.2*  --   --   --   PLT 310  --   --   --  266  --   --   --   APTT  --   --   --   --   --  46* 33 40*  LABPROT  --   --   --   --  16.5*  --   --   --   INR  --   --   --   --  1.3*  --   --   --   HEPARINUNFRC  --   --   --    < >  --  1.05* 0.43 0.44  CREATININE 1.51*  --   --   --   --  1.56*  --   --   TROPONINIHS 34* 48* 91*  --   --   --   --   --    < > = values in this interval not displayed.     Estimated Creatinine Clearance: 69.1 mL/min (A) (by C-G formula based on SCr of 1.56 mg/dL (H)).   Medical History: Past Medical History:  Diagnosis Date   AF (paroxysmal atrial fibrillation) ([place]) 06/06/2018   CAD (coronary artery disease) 05/13/2018   Chronic diastolic CHF (congestive heart failure) ([place]) 11/17/2018   Coronary artery disease    Diabetes mellitus type II, non insulin dependent ([place]) 11/17/2018   Hypothyroidism 11/17/2018    Medications:  Xarelto 20 mg PTA -- last dose 10/18 @ 1800  Assessment: 61 yo M with PMH CAD s/p PCI, HLD, HTN, HFrEF, severe aortic insufficiency s/p bioprosthetic AV replacement (2015) presents with chest pain and SOB c/f NSTEMI. Troponins trending up currently, most recent value 91.  Baseline Labs: aPTT - unknown; INR - unknown (1.3 after starting UFH) Hgb - 11.9; Plts - 310  Date Time aPTT/HL  Rate/Comment 10/19 2358 HL  >1.1  Supratherapeutic (d/t Xarelto PTA)  10/20  0717 HL 1.05  Supratherapeutic (d/t Xarelto PTA) 10/20 1621 aPTT 33; HL 0.43 Subtherapeutic; not correlating 10/21 0014 aPTT 40, HL 0.44 Subtherapeutic, not correlating     Goal of Therapy:  Heparin level 0.3-0.7 units/ml Monitor platelets by anticoagulation protocol: Yes   Plan: Pt taking Xarelto 20 mg PTA -- last dose 10/18 @ 1800 Bolus 3100 units x 1 Increase heparin infusion to 1450 un/hr Recheck aPTT in 6 hr after rate change Once both values are correlating, titrate by HL alone Continue to monitor HL, H&H and platelets daily while on heparin gtt.  Renda Rolls, PharmD, [facility] 03/08/2022 1:38 AM

## 2022-03-08 NOTE — Consult Note (Signed)
ANTICOAGULATION CONSULT NOTE  Pharmacy Consult for heparin Indication: chest pain/ACS  No Known Allergies  Patient Measurements: Height: 5\' 10"  (177.8 cm) Weight: 130 kg (286 lb 9.6 oz) IBW/kg (Calculated) : 73 Heparin Dosing Weight: 102.9 kg  Vital Signs: Temp: 98.2 F (36.8 C) (10/21 2018) Temp Source: Oral (10/21 2018) BP: 144/84 (10/21 2018) Pulse Rate: 68 (10/21 2018)  Labs: Recent Labs    03/06/22 1112 03/06/22 1233 03/06/22 1440 03/06/22 2358 03/07/22 0504 03/07/22 0717 03/07/22 1621 03/08/22 0014 03/08/22 0426 03/08/22 0835 03/08/22 1417 03/08/22 2118  HGB 11.9*  --   --   --  9.4*  --   --   --  9.1*  --   --   --   HCT 39.2  --   --   --  31.2*  --   --   --  29.6*  --   --   --   PLT 310  --   --   --  266  --   --   --  254  --   --   --   APTT  --   --   --    < >  --  46* 33 40*  --  73* 56* 75*  LABPROT  --   --   --   --  16.5*  --   --   --   --   --   --   --   INR  --   --   --   --  1.3*  --   --   --   --   --   --   --   HEPARINUNFRC  --   --   --    < >  --  1.05* 0.43 0.44  --   --   --   --   CREATININE 1.51*  --   --   --   --  1.56*  --   --  1.50*  --   --   --   TROPONINIHS 34* 48* 91*  --   --   --   --   --   --   --   --   --    < > = values in this interval not displayed.     Estimated Creatinine Clearance: 71.9 mL/min (A) (by C-G formula based on SCr of 1.5 mg/dL (H)).   Medical History: Past Medical History:  Diagnosis Date   AF (paroxysmal atrial fibrillation) ([place]) 06/06/2018   CAD (coronary artery disease) 05/13/2018   Chronic diastolic CHF (congestive heart failure) ([place]) 11/17/2018   Coronary artery disease    Diabetes mellitus type II, non insulin dependent ([place]) 11/17/2018   Hypothyroidism 11/17/2018    Medications:  Xarelto 20 mg PTA -- last dose 10/18 @ 1800  Assessment: 61 yo M with PMH CAD s/p PCI, HLD, HTN, HFrEF, severe aortic insufficiency s/p bioprosthetic AV replacement (2015) presents with chest pain and  SOB c/f NSTEMI. Troponins trending up currently, most recent value 91.  Baseline Labs: aPTT - unknown; INR - unknown (1.3 after starting UFH) Hgb - 11.9; Plts - 310  Date Time aPTT/HL  Rate/Comment 10/19 2358 HL >1.1  Supratherapeutic (d/t Xarelto PTA)  10/20  0717 HL 1.05  Supratherapeutic (d/t Xarelto PTA) 10/20 1621 aPTT 33; HL 0.43 Subtherapeutic; not correlating 10/21  0835 aPTT 73  Therapeutic x 1 10/21  1417 aPTT 56  Subtherapeutic 10/21 2118 aPTT 75  Therapeutic x1      Goal of Therapy:  Heparin level 0.3-0.7 units/ml aPTT: 66-102 sec Monitor platelets by anticoagulation protocol: Yes   Plan: aPTT therapeutic x1.Continue heparin infusion at 1650 un/hr Recheck aPTT in 6 hrs after rate change. May titrate by HL alone once both values are correlating, Continue to monitor H&H and platelets daily while on heparin gtt.  Darrick Penna, PharmD Clinical Pharmacist 03/08/2022 9:48 PM

## 2022-03-08 NOTE — Progress Notes (Signed)
SUBJECTIVE: Patient is a 61 year old male with history of CAD, status post PCI to SVG to OM1, HLD, HTN, HFrEF, severe AS post bioprosthetic valve replacement in 2015. Patient presented to ED on 03/06/22 complaining of chest pain and shortness of breath.   Vitals:   03/08/22 0001 03/08/22 0408 03/08/22 0758 03/08/22 1150  BP: 119/61 112/68 117/72 (!) 144/77  Pulse: 68 64 62 64  Resp: 19 20 17 18   Temp: 98.8 F (37.1 C) 98.6 F (37 C) 97.6 F (36.4 C) 98.2 F (36.8 C)  TempSrc: Oral Oral    SpO2: 93% 92% 94% 94%  Weight:      Height:        Intake/Output Summary (Last 24 hours) at 03/08/2022 1319 Last data filed at 03/08/2022 1113 Gross per 24 hour  Intake --  Output 1250 ml  Net -1250 ml    LABS: Basic Metabolic Panel: Recent Labs    03/07/22 0717 03/08/22 0426  NA 139 137  K 3.6 3.4*  CL 104 104  CO2 28 25  GLUCOSE 180* 174*  BUN 32* 31*  CREATININE 1.56* 1.50*  CALCIUM 8.6* 8.6*   Liver Function Tests: No results for input(s): "AST", "ALT", "ALKPHOS", "BILITOT", "PROT", "ALBUMIN" in the last 72 hours. No results for input(s): "LIPASE", "AMYLASE" in the last 72 hours. CBC: Recent Labs    03/07/22 0504 03/08/22 0426  WBC 10.7* 10.2  HGB 9.4* 9.1*  HCT 31.2* 29.6*  MCV 83.0 82.0  PLT 266 254   Cardiac Enzymes: No results for input(s): "CKTOTAL", "CKMB", "CKMBINDEX", "TROPONINI" in the last 72 hours. BNP: Invalid input(s): "POCBNP" D-Dimer: No results for input(s): "DDIMER" in the last 72 hours. Hemoglobin A1C: Recent Labs    03/06/22 1109  HGBA1C 9.5*   Fasting Lipid Panel: No results for input(s): "CHOL", "HDL", "LDLCALC", "TRIG", "CHOLHDL", "LDLDIRECT" in the last 72 hours. Thyroid Function Tests: Recent Labs    03/08/22 0426  TSH 24.430*   Anemia Panel: No results for input(s): "VITAMINB12", "FOLATE", "FERRITIN", "TIBC", "IRON", "RETICCTPCT" in the last 72 hours.   PHYSICAL EXAM General: Well developed, well nourished, in no acute  distress HEENT:  Normocephalic and atramatic Neck:  No JVD.  Lungs: Clear bilaterally to auscultation and percussion. Heart: HRRR . Normal S1 and S2 without gallops or murmurs.  Abdomen: Bowel sounds are positive, abdomen soft and non-tender  Msk:  Back normal, normal gait. Normal strength and tone for age. Extremities: No clubbing, cyanosis or edema.   Neuro: Alert and oriented X 3. Psych:  Good affect, responds appropriately  TELEMETRY: sinus rhythm, HR 65 bpm  ASSESSMENT AND PLAN: Patient resting comfortably in bed. Denies chest pain. Continues to be short of breath. Echo 10/20 revealed normal EF. Continue amiodarone. Add spironolactone to help with shortness of breath, grade II DD. Will continue to follow.   Principal Problem:   NSTEMI (non-ST elevated myocardial infarction) ([place]) Active Problems:   CAD (coronary artery disease)   Chest pain   Type 2 diabetes mellitus with hyperlipidemia ([facility])   Hypothyroidism   HLD (hyperlipidemia)   Elevated troponin   HTN (hypertension)   AF (paroxysmal atrial fibrillation) ([facility])   AKI (acute kidney injury) ([place])    Rachell Druckenmiller, FNP-C 03/08/2022 1:19 PM

## 2022-03-08 NOTE — Progress Notes (Signed)
Craig Reese, is a 61 y.o. male, DOB - 28-Jun-1960, XTK:[id number] Admit date - 03/06/2022    Outpatient Primary MD for the patient is [facility], [place]  LOS - 2  days  Chief Complaint  Patient presents with   Afib RVR        Brief summary   Patient is a 61 year old male with CAD status post PCI to SVG to OM1, HLP, HTN, HFrEF, severe aortic insufficiency status post Freestyle bioprosthetic aortic valve replacement in 2015, presented to ED with chest pain and shortness of breath.  Patient reported that on the morning of admission, he had chest pain, 10/10, persistent, associated with shortness of breath.  No jaw discomfort however had left arm numbness. Also reported intermittent episodes of dizziness and near syncope.   In ED, initially was found to be in atrial fibrillation with RVR and hypotensive.  Heart rate 122, BP 107/72.  He was given 1 L IV fluid bolus and, required 10 mg IV one-time dose of diltiazem, converted to NSR. Troponin 48-> 91 Cardiology was consulted and patient was admitted for further work-up.  Assessment & Plan    Principal Problem:   NSTEMI (non-ST elevated myocardial infarction) ([place]) in the setting of complex known history of CAD, elevated troponins, chest pain -Patient has known history of CAD status post CABG in 2015, PCI SVG to OM1 and 2019.  Cardiac cath in 08/2020 at [facility] read as diffuse moderate CAD without focal high-grade stenosis.  Patent SVG to OM1 with 40 to 50% proximal stenosis.  Aggressive secondary prevention and medical therapy was recommended -2D echo showed EF of 50 to 55%, G2 DD, no regional wall motion abnormalities.  Right ventricular systolic function low normal. -On IV heparin drip, cardiology following, will await recommendations  Active Problems:    AF (paroxysmal atrial fibrillation)  ([facility]) -Continue IV heparin drip.  Xarelto on hold -Continue amiodarone 200 mg daily    Type 2 diabetes mellitus with hyperlipidemia ([place]), poorly controlled with hyperglycemia, NIDDM - Hold metformin, Invokana -Hemoglobin A1c 9.5 Continue SSI, NovoLog meal coverage 4 units 3 times daily AC  Acute kidney injury, likely superimposed on CKD stage II -Creatinine 1.27 on 10/23/2021 (Care Everywhere).  Baseline creatinine 1.1-1.27 -Admitted with creatinine of 1.5, creatinine stable at 1.5 -Hold Lasix, chlorthalidone, Invokana, losartan  Near syncopal episodes -During my encounter, patient ported that she he has been having intermittent dizziness and near syncopal episodes for last month  -On admission, was in atrial fibrillation with RVR and hypotensive -Currently Lasix, chlorthalidone, losartan on hold, will await cardiology recommendations.     Hypothyroidism -TSH 24.4, T4 free 0.7 (TSH 10.3 on 04/07/2020) -On levothyroxine 175 mcg daily, increase to 200 mcg daily, needs outpatient follow-up with PCP and recheck TSH in 4 weeks.    HLD (hyperlipidemia) -Continue Lipitor    HTN (hypertension) -BP stable, continue beta-blocker, Cardizem, Lasix, heart allysine -Added IV hydralazine as needed with parameters  Chronic combined systolic and diastolic  CHF, aortic insufficiency status post bioprosthetic AVR -Cardiology following, will await recommendations, added Aldactone   Obesity Estimated body mass index is 41.12 kg/m as calculated from the following:   Height as of this encounter: 5\' 10"  (1.778 m).   Weight as of this encounter: 130 kg.  Code Status: Full CODE STATUS DVT Prophylaxis:  Place TED hose Start: 03/06/22 1549   Level of Care: Level of care: Telemetry Cardiac Family Communication: Updated patient.   Disposition Plan:      Remains inpatient appropriate: Awaiting evaluation recommendations by cardiology  Procedures:  None  Consultants:    Cardiology  Antimicrobials: None   Medications  amiodarone  200 mg Oral QHS   atorvastatin  80 mg Oral QPM   diltiazem  10 mg Intravenous Once   gabapentin  300 mg Oral BID   hydrALAZINE  100 mg Oral BID   insulin aspart  0-15 Units Subcutaneous TID WC   insulin aspart  0-5 Units Subcutaneous QHS   insulin aspart  4 Units Subcutaneous TID WC   levothyroxine  175 mcg Oral QAC breakfast   metoprolol tartrate  50 mg Oral BID   spironolactone  25 mg Oral Daily      Subjective:   Craig Reese was seen and examined today.  States minimal chest pain, no acute issues overnight.  No nausea vomiting fevers, dizziness or lightheadedness.    Objective:   Vitals:   03/08/22 0001 03/08/22 0408 03/08/22 0758 03/08/22 1150  BP: 119/61 112/68 117/72 (!) 144/77  Pulse: 68 64 62 64  Resp: 19 20 17 18   Temp: 98.8 F (37.1 C) 98.6 F (37 C) 97.6 F (36.4 C) 98.2 F (36.8 C)  TempSrc: Oral Oral    SpO2: 93% 92% 94% 94%  Weight:      Height:        Intake/Output Summary (Last 24 hours) at 03/08/2022 1338 Last data filed at 03/08/2022 1113 Gross per 24 hour  Intake --  Output 1250 ml  Net -1250 ml     Wt Readings from Last 3 Encounters:  03/06/22 130 kg  04/07/20 (!) 138.2 kg  11/21/18 124.9 kg   Physical Exam General: Alert and oriented x 3, NAD Cardiovascular: S1 S2 clear, RRR.  Respiratory: CTAB, no wheezing Gastrointestinal: Soft, nontender, nondistended, NBS Ext: no pedal edema bilaterally Neuro: no new deficits psych: Normal affect     Data Reviewed:  I have personally reviewed following labs    CBC Lab Results  Component Value Date   WBC 10.2 03/08/2022   RBC 3.61 (L) 03/08/2022   HGB 9.1 (L) 03/08/2022   HCT 29.6 (L) 03/08/2022   MCV 82.0 03/08/2022   MCH 25.2 (L) 03/08/2022   PLT 254 03/08/2022   MCHC 30.7 03/08/2022   RDW 15.9 (H) 03/08/2022   LYMPHSABS 1.9 04/07/2020   MONOABS 0.8 04/07/2020   EOSABS 1.2 (H) 04/07/2020   BASOSABS 0.2 (H)  [id number]     Last metabolic panel Lab Results  Component Value Date   NA 137 03/08/2022   K 3.4 (L) 03/08/2022   CL 104 03/08/2022   CO2 25 03/08/2022   BUN 31 (H) 03/08/2022   CREATININE 1.50 (H) 03/08/2022   GLUCOSE 174 (H) 03/08/2022   GFRNONAA 53 (L) 03/08/2022   GFRAA >60 11/21/2018   CALCIUM 8.6 (L) 03/08/2022   PHOS 3.0 08/19/2013   PROT 7.8 04/07/2020   ALBUMIN 4.0 04/07/2020   BILITOT 0.6 04/07/2020   ALKPHOS 74 04/07/2020  AST 53 (H) 04/07/2020   ALT 66 (H) 04/07/2020   ANIONGAP 8 03/08/2022    CBG (last 3)  Recent Labs    03/07/22 2043 03/08/22 0800 03/08/22 1150  GLUCAP 154* 172* 210*      Coagulation Profile: Recent Labs  Lab 03/07/22 0504  INR 1.3*     Radiology Studies: I have personally reviewed the imaging studies  ECHOCARDIOGRAM COMPLETE  Result Date: 03/07/2022    ECHOCARDIOGRAM REPORT   Patient Name:   Craig Reese Date of Exam: 03/07/2022 Medical Rec #:  [id number]    Height:       70.0 in Accession #:    [id number]   Weight:       286.6 lb Date of Birth:  30-Sep-1960    BSA:          2.432 m Patient Age:    4 years     BP:           115/73 mmHg Patient Gender: M            HR:           57 bpm. Exam Location:  [facility] Procedure: 2D Echo, Color Doppler, Cardiac Doppler and Intracardiac            Opacification Agent Indications:     R06.00 Dyspnea; Elevated troponin  History:         Patient has prior history of Echocardiogram examinations, most                  recent 04/07/2020. CHF, CAD; Risk Factors:Diabetes.  Sonographer:     Charmayne Sheer Referring Phys:  [id number] AMY N COX Diagnosing Phys: Ida Rogue MD  Sonographer Comments: Suboptimal apical window and no subcostal window. Image acquisition challenging due to patient body habitus. IMPRESSIONS  1. Left ventricular ejection fraction, by estimation, is 50 to 55%. The left ventricle has low normal function. The left ventricle has no regional wall motion abnormalities. There is moderate left  ventricular hypertrophy. Left ventricular diastolic parameters are consistent with Grade II diastolic dysfunction (pseudonormalization).  2. Right ventricular systolic function is low normal. The right ventricular size is normal. Tricuspid regurgitation signal is inadequate for assessing PA pressure.  3. Left atrial size was mildly dilated.  4. The mitral valve is normal in structure. Mild mitral valve regurgitation. No evidence of mitral stenosis.  5. The aortic valve was not well visualized. Aortic valve regurgitation is not visualized. Aortic valve sclerosis/calcification is present, without any evidence of aortic stenosis.  6. The inferior vena cava is normal in size with greater than 50% respiratory variability, suggesting right atrial pressure of 3 mmHg. FINDINGS  Left Ventricle: Left ventricular ejection fraction, by estimation, is 50 to 55%. The left ventricle has low normal function. The left ventricle has no regional wall motion abnormalities. Definity contrast agent was given IV to delineate the left ventricular endocardial borders. The left ventricular internal cavity size was normal in size. There is moderate left ventricular hypertrophy. Left ventricular diastolic parameters are consistent with Grade II diastolic dysfunction (pseudonormalization). Right Ventricle: The right ventricular size is normal. No increase in right ventricular wall thickness. Right ventricular systolic function is low normal. Tricuspid regurgitation signal is inadequate for assessing PA pressure. Left Atrium: Left atrial size was mildly dilated. Right Atrium: Right atrial size was normal in size. Pericardium: There is no evidence of pericardial effusion. Mitral Valve: The mitral valve is normal in structure. There is mild calcification of the mitral  valve leaflet(s). Mild mitral annular calcification. Mild mitral valve regurgitation. No evidence of mitral valve stenosis. Tricuspid Valve: The tricuspid valve is normal in  structure. Tricuspid valve regurgitation is not demonstrated. No evidence of tricuspid stenosis. Aortic Valve: The aortic valve was not well visualized. Aortic valve regurgitation is not visualized. Aortic valve sclerosis/calcification is present, without any evidence of aortic stenosis. Aortic valve mean gradient measures 2.0 mmHg. Aortic valve peak gradient measures 3.4 mmHg. Aortic valve area, by VTI measures 3.85 cm. Pulmonic Valve: The pulmonic valve was normal in structure. Pulmonic valve regurgitation is not visualized. No evidence of pulmonic stenosis. Aorta: The aortic root is normal in size and structure. Venous: The inferior vena cava is normal in size with greater than 50% respiratory variability, suggesting right atrial pressure of 3 mmHg. IAS/Shunts: No atrial level shunt detected by color flow Doppler.  LEFT VENTRICLE PLAX 2D LVIDd:         5.10 cm      Diastology LVIDs:         3.50 cm      LV e' medial:    5.77 cm/s LV PW:         1.60 cm      LV E/e' medial:  17.7 LV IVS:        1.30 cm      LV e' lateral:   5.87 cm/s LVOT diam:     2.30 cm      LV E/e' lateral: 17.4 LV SV:         85 LV SV Index:   35 LVOT Area:     4.15 cm  LV Volumes (MOD) LV vol d, MOD A2C: 76.7 ml LV vol d, MOD A4C: 148.0 ml LV vol s, MOD A2C: 27.5 ml LV vol s, MOD A4C: 44.9 ml LV SV MOD A2C:     49.2 ml LV SV MOD A4C:     148.0 ml LV SV MOD BP:      78.5 ml LEFT ATRIUM             Index LA Vol (A2C):   53.3 ml 21.91 ml/m LA Vol (A4C):   99.6 ml 40.95 ml/m LA Biplane Vol: 74.3 ml 30.55 ml/m  AORTIC VALVE                    PULMONIC VALVE AV Area (Vmax):    3.97 cm     PV Vmax:       0.85 m/s AV Area (Vmean):   3.80 cm     PV Vmean:      59.600 cm/s AV Area (VTI):     3.85 cm     PV VTI:        0.214 m AV Vmax:           91.80 cm/s   PV Peak grad:  2.9 mmHg AV Vmean:          62.900 cm/s  PV Mean grad:  2.0 mmHg AV VTI:            0.221 m AV Peak Grad:      3.4 mmHg AV Mean Grad:      2.0 mmHg LVOT Vmax:         87.70  cm/s LVOT Vmean:        57.500 cm/s LVOT VTI:          0.205 m LVOT/AV VTI ratio: 0.93  AORTA Ao Root diam: 3.40 cm MITRAL VALVE  MV Area (PHT): 2.79 cm     SHUNTS MV Decel Time: 272 msec     Systemic VTI:  0.20 m MV E velocity: 102.00 cm/s  Systemic Diam: 2.30 cm MV A velocity: 78.10 cm/s MV E/A ratio:  1.31 Ida Rogue MD Electronically signed by Ida Rogue MD Signature Date/Time: 03/07/2022/1:11:46 PM    Final        Estill Cotta M.D. [facility] 03/08/2022, 1:38 PM  Available via Epic secure chat 7am-7pm After 7 pm, please refer to night coverage provider listed on amion.

## 2022-03-09 LAB — CBC WITH DIFFERENTIAL/PLATELET
Abs Immature Granulocytes: 0.04 10*3/uL (ref 0.00–0.07)
Basophils Absolute: 0.1 10*3/uL (ref 0.0–0.1)
Basophils Relative: 1 %
Eosinophils Absolute: 1.6 10*3/uL — ABNORMAL HIGH (ref 0.0–0.5)
Eosinophils Relative: 13 %
HCT: 31.3 % — ABNORMAL LOW (ref 39.0–52.0)
Hemoglobin: 9.6 g/dL — ABNORMAL LOW (ref 13.0–17.0)
Immature Granulocytes: 0 %
Lymphocytes Relative: 22 %
Lymphs Abs: 2.6 10*3/uL (ref 0.7–4.0)
MCH: 25.1 pg — ABNORMAL LOW (ref 26.0–34.0)
MCHC: 30.7 g/dL (ref 30.0–36.0)
MCV: 81.7 fL (ref 80.0–100.0)
Monocytes Absolute: 0.8 10*3/uL (ref 0.1–1.0)
Monocytes Relative: 6 %
Neutro Abs: 6.7 10*3/uL (ref 1.7–7.7)
Neutrophils Relative %: 58 %
Platelets: 252 10*3/uL (ref 150–400)
RBC: 3.83 MIL/uL — ABNORMAL LOW (ref 4.22–5.81)
RDW: 16 % — ABNORMAL HIGH (ref 11.5–15.5)
WBC: 11.8 10*3/uL — ABNORMAL HIGH (ref 4.0–10.5)
nRBC: 0 % (ref 0.0–0.2)

## 2022-03-09 LAB — HEPARIN LEVEL (UNFRACTIONATED): Heparin Unfractionated: 0.38 IU/mL (ref 0.30–0.70)

## 2022-03-09 LAB — GLUCOSE, CAPILLARY
Glucose-Capillary: 167 mg/dL — ABNORMAL HIGH (ref 70–99)
Glucose-Capillary: 172 mg/dL — ABNORMAL HIGH (ref 70–99)
Glucose-Capillary: 185 mg/dL — ABNORMAL HIGH (ref 70–99)

## 2022-03-09 LAB — BASIC METABOLIC PANEL
Anion gap: 6 (ref 5–15)
BUN: 26 mg/dL — ABNORMAL HIGH (ref 6–20)
CO2: 27 mmol/L (ref 22–32)
Calcium: 8.5 mg/dL — ABNORMAL LOW (ref 8.9–10.3)
Chloride: 104 mmol/L (ref 98–111)
Creatinine, Ser: 1.47 mg/dL — ABNORMAL HIGH (ref 0.61–1.24)
GFR, Estimated: 55 mL/min — ABNORMAL LOW (ref >60)
Glucose, Bld: 175 mg/dL — ABNORMAL HIGH (ref 70–99)
Potassium: 3.9 mmol/L (ref 3.5–5.1)
Sodium: 137 mmol/L (ref 135–145)

## 2022-03-09 LAB — MAGNESIUM: Magnesium: 2 mg/dL (ref 1.7–2.4)

## 2022-03-09 LAB — APTT: aPTT: 57 seconds — ABNORMAL HIGH (ref 24–36)

## 2022-03-09 MED ORDER — PANTOPRAZOLE SODIUM 20 MG PO TBEC
20.0000 mg | DELAYED_RELEASE_TABLET | Freq: Every day | ORAL | Status: DC
  Administered 2022-03-09: 20 mg via ORAL
  Filled 2022-03-09: qty 1

## 2022-03-09 MED ORDER — LEVOTHYROXINE SODIUM 200 MCG PO TABS: 200.0000 ug | ORAL_TABLET | Freq: Every day | ORAL | 0 refills | Status: DC

## 2022-03-09 MED ORDER — HEPARIN BOLUS VIA INFUSION
1500.0000 [IU] | Freq: Once | INTRAVENOUS | Status: AC
  Administered 2022-03-09: 1500 [IU] via INTRAVENOUS
  Filled 2022-03-09: qty 1500

## 2022-03-09 MED ORDER — PANTOPRAZOLE SODIUM 20 MG PO TBEC: 20.0000 mg | DELAYED_RELEASE_TABLET | Freq: Two times a day (BID) | ORAL | 0 refills | Status: DC

## 2022-03-09 MED ORDER — SPIRONOLACTONE 25 MG PO TABS: 25.0000 mg | ORAL_TABLET | Freq: Every day | ORAL | 0 refills | Status: DC

## 2022-03-09 MED ORDER — FUROSEMIDE 10 MG/ML IJ SOLN
40.0000 mg | Freq: Once | INTRAMUSCULAR | Status: AC
  Administered 2022-03-09: 40 mg via INTRAVENOUS
  Filled 2022-03-09: qty 4

## 2022-03-09 MED ORDER — RIVAROXABAN 20 MG PO TABS
20.0000 mg | ORAL_TABLET | Freq: Every day | ORAL | Status: DC
  Administered 2022-03-09: 20 mg via ORAL
  Filled 2022-03-09: qty 1

## 2022-03-09 MED ORDER — FUROSEMIDE 40 MG PO TABS: 40.0000 mg | ORAL_TABLET | Freq: Every day | ORAL | 0 refills | Status: DC

## 2022-03-09 NOTE — Discharge Summary (Signed)
Physician Discharge Summary  Craig Reese ION:[id number] DOB: 06/05/1960 DOA: 03/06/2022  [facility] date: 03/06/2022 Discharge date: 03/09/2022  Admitted From: Home Disposition:  Home  Recommendations for Outpatient Follow-up:  Follow up with PCP in 1-2 weeks Follow up with [facility] cardiology 1-2 weeks  Home Health:No  Equipment/Devices:None   Discharge Condition:Stable  CODE STATUS:FULL  Diet recommendation: Carb modified  Brief/Interim Summary: Patient is a 61 year old male with CAD status post PCI to SVG to OM1, HLP, HTN, HFrEF, severe aortic insufficiency status post Freestyle bioprosthetic aortic valve replacement in 2015, presented to ED with chest pain and shortness of breath.  Patient reported that on the morning of admission, he had chest pain, 10/10, persistent, associated with shortness of breath.  No jaw discomfort however had left arm numbness. Also reported intermittent episodes of dizziness and near syncope.   In ED, initially was found to be in atrial fibrillation with RVR and hypotensive.  Heart rate 122, BP 107/72.  He was given 1 L IV fluid bolus and, required 10 mg IV one-time dose of diltiazem, converted to NSR. Troponin 48-> 91 Cardiology was consulted and patient was admitted for further work-up. Completed 48h heparin gtt No indication for ischemic evaluation per cardiology Converted to sinus rhythm, rate controlled Chest pain resolved at time of discharge Patient cleared for discharge.  Suspect GI contribution to chest pain.  At time of discharge will recommend patient resume his home sucralfate, discontinue omeprazole.  Start pantoprazole 40 mg p.o. twice daily.  Follow-up outpatient PCP and [facility] cardiology.    Discharge Diagnoses:  Principal Problem:   NSTEMI (non-ST elevated myocardial infarction) ([place]) Active Problems:   CAD (coronary artery disease)   Chest pain   Type 2 diabetes mellitus with hyperlipidemia  (HCC)   Hypothyroidism   HLD (hyperlipidemia)   Elevated troponin   HTN (hypertension)   AF (paroxysmal atrial fibrillation) (HCC)   AKI (acute kidney injury) ([place])  NSTEMI (non-ST elevated myocardial infarction) ([place]) in the setting of complex known history of CAD, elevated troponins, chest pain -Patient has known history of CAD status post CABG in 2015, PCI SVG to OM1 and 2019.  Cardiac cath in 08/2020 at [facility] read as diffuse moderate CAD without focal high-grade stenosis.  Patent SVG to OM1 with 40 to 50% proximal stenosis.  Aggressive secondary prevention and medical therapy was recommended -2D echo showed EF of 50 to 55%, G2 DD, no regional wall motion abnormalities.  Right ventricular systolic function low normal. -Patient completed 48 hours of heparin GTT in house.  Discontinue.  Restart Xarelto at time of discharge   Active Problems:    AF (paroxysmal atrial fibrillation) (HCC) -Home Xarelto on discharge -Continue amiodarone 200 mg daily     Type 2 diabetes mellitus with hyperlipidemia ([place]), poorly controlled with hyperglycemia, NIDDM Home metformin and Invokana on discharge C   Acute kidney injury, likely superimposed on CKD stage II CKD stage II felt most likely.  Baseline unclear.  Can resume home antihypertensive regimen.  Follow-up outpatient PCP.   Near syncopal episodes -During my encounter, patient ported that she he has been having intermittent dizziness and near syncopal episodes for last month  -On admission, was in atrial fibrillation with RVR and hypotensive -A-fib resolved.  Can resume home regimen       Hypothyroidism -TSH 24.4, T4 free 0.7 (TSH 10.3 on 04/07/2020) -On levothyroxine 175 mcg daily, increase to 200 mcg daily, needs outpatient follow-up with PCP and  recheck TSH in 4 weeks.     HLD (hyperlipidemia) -Continue Lipitor     HTN (hypertension) -BP stable, continue current antihypertensive regimen   Chronic combined systolic and  diastolic CHF, aortic insufficiency status post bioprosthetic AVR -Cardiology following.  Added Aldactone.  Hold chlorthalidone on discharge  Discharge Instructions  Discharge Instructions     AMB Referral to Phase II Cardiac Rehab   Complete by: As directed    Diagnosis:  CABG Coronary Stents NSTEMI     CABG X ___: 2   After initial evaluation and assessments completed: Virtual Based Care may be provided alone or in conjunction with Phase 2 Cardiac Rehab based on patient barriers.: Yes   Intensive Cardiac Rehabilitation (ICR) [place] location only OR Traditional Cardiac Rehabilitation (TCR) *If criteria for ICR are not met will enroll in TCR [facility] only): Yes   Diet - low sodium heart healthy   Complete by: As directed    Increase activity slowly   Complete by: As directed       Allergies as of 03/09/2022   No Known Allergies      Medication List     STOP taking these medications    chlorthalidone 25 MG tablet Commonly known as: HYGROTON   omeprazole 20 MG capsule Commonly known as: PRILOSEC       TAKE these medications    acetaminophen 500 MG tablet Commonly known as: TYLENOL Take 500-1,000 mg by mouth every 6 (six) hours as needed for mild pain or fever.   amiodarone 200 MG tablet Commonly known as: PACERONE One tab po twice a day for one week then one tablet daily afterwards What changed:  how much to take how to take this when to take this additional instructions   atorvastatin 80 MG tablet Commonly known as: LIPITOR Take 80 mg by mouth every evening.   furosemide 40 MG tablet Commonly known as: LASIX Take 1 tablet (40 mg total) by mouth daily.   gabapentin 300 MG capsule Commonly known as: NEURONTIN Take 300 mg by mouth 2 (two) times daily.   hydrALAZINE 50 MG tablet Commonly known as: APRESOLINE Take 100 mg by mouth 2 (two) times daily.   Invokana 100 MG Tabs tablet Generic drug: canagliflozin Take 100 mg by mouth daily before breakfast.    levothyroxine 175 MCG tablet Commonly known as: SYNTHROID Take 175 mcg by mouth daily before breakfast.   losartan 100 MG tablet Commonly known as: COZAAR Take 100 mg by mouth daily.   metFORMIN 1000 MG tablet Commonly known as: GLUCOPHAGE Take 1,000 mg by mouth 2 (two) times daily with a meal.   metoprolol tartrate 50 MG tablet Commonly known as: LOPRESSOR Take 1 tablet (50 mg total) by mouth 2 (two) times daily.   pantoprazole 20 MG tablet Commonly known as: PROTONIX Take 1 tablet (20 mg total) by mouth 2 (two) times daily.   rivaroxaban 20 MG Tabs tablet Commonly known as: XARELTO Take 1 tablet (20 mg total) by mouth daily. What changed: when to take this   spironolactone 25 MG tablet Commonly known as: ALDACTONE Take 1 tablet (25 mg total) by mouth daily. Start taking on: March 10, 2022   sucralfate 1 GM/10ML suspension Commonly known as: CARAFATE Take 1 g by mouth 4 (four) times daily.        No Known Allergies  Consultations: Cardiology   Procedures/Studies: DG Chest Port 1 View  Result Date: 03/08/2022 CLINICAL DATA:  Shortness of breath EXAM: PORTABLE CHEST 1 VIEW  COMPARISON:  03/06/2022 FINDINGS: Transverse diameter of heart is increased. Central pulmonary vessels are more prominent. There is poor inspiration. There is interval increase in interstitial markings in parahilar regions and lower lung fields. There is no focal consolidation. There is no significant pleural effusion or pneumothorax. There is previous coronary bypass surgery. IMPRESSION: Cardiomegaly. Central pulmonary vessels are more prominent which may be due to poor inspiration or CHF. There is slight prominence of interstitial markings in parahilar regions and lower lung fields suggesting possible mild interstitial edema. There is no focal pulmonary consolidation. Electronically Signed   By: Elmer Picker M.D.   On: 03/08/2022 15:58   ECHOCARDIOGRAM COMPLETE  Result Date:  03/07/2022    ECHOCARDIOGRAM REPORT   Patient Name:   Craig Reese Date of Exam: 03/07/2022 Medical Rec #:  [id number]    Height:       70.0 in Accession #:    [id number]   Weight:       286.6 lb Date of Birth:  May 15, 1961    BSA:          2.432 m Patient Age:    61 years     BP:           115/73 mmHg Patient Gender: M            HR:           57 bpm. Exam Location:  [facility] Procedure: 2D Echo, Color Doppler, Cardiac Doppler and Intracardiac            Opacification Agent Indications:     R06.00 Dyspnea; Elevated troponin  History:         Patient has prior history of Echocardiogram examinations, most                  recent 04/07/2020. CHF, CAD; Risk Factors:Diabetes.  Sonographer:     Charmayne Sheer Referring Phys:  [id number] AMY N COX Diagnosing Phys: Ida Rogue MD  Sonographer Comments: Suboptimal apical window and no subcostal window. Image acquisition challenging due to patient body habitus. IMPRESSIONS  1. Left ventricular ejection fraction, by estimation, is 50 to 55%. The left ventricle has low normal function. The left ventricle has no regional wall motion abnormalities. There is moderate left ventricular hypertrophy. Left ventricular diastolic parameters are consistent with Grade II diastolic dysfunction (pseudonormalization).  2. Right ventricular systolic function is low normal. The right ventricular size is normal. Tricuspid regurgitation signal is inadequate for assessing PA pressure.  3. Left atrial size was mildly dilated.  4. The mitral valve is normal in structure. Mild mitral valve regurgitation. No evidence of mitral stenosis.  5. The aortic valve was not well visualized. Aortic valve regurgitation is not visualized. Aortic valve sclerosis/calcification is present, without any evidence of aortic stenosis.  6. The inferior vena cava is normal in size with greater than 50% respiratory variability, suggesting right atrial pressure of 3 mmHg. FINDINGS  Left Ventricle: Left ventricular ejection fraction,  by estimation, is 50 to 55%. The left ventricle has low normal function. The left ventricle has no regional wall motion abnormalities. Definity contrast agent was given IV to delineate the left ventricular endocardial borders. The left ventricular internal cavity size was normal in size. There is moderate left ventricular hypertrophy. Left ventricular diastolic parameters are consistent with Grade II diastolic dysfunction (pseudonormalization). Right Ventricle: The right ventricular size is normal. No increase in right ventricular wall thickness. Right ventricular systolic function is low normal. Tricuspid regurgitation signal is inadequate for  assessing PA pressure. Left Atrium: Left atrial size was mildly dilated. Right Atrium: Right atrial size was normal in size. Pericardium: There is no evidence of pericardial effusion. Mitral Valve: The mitral valve is normal in structure. There is mild calcification of the mitral valve leaflet(s). Mild mitral annular calcification. Mild mitral valve regurgitation. No evidence of mitral valve stenosis. Tricuspid Valve: The tricuspid valve is normal in structure. Tricuspid valve regurgitation is not demonstrated. No evidence of tricuspid stenosis. Aortic Valve: The aortic valve was not well visualized. Aortic valve regurgitation is not visualized. Aortic valve sclerosis/calcification is present, without any evidence of aortic stenosis. Aortic valve mean gradient measures 2.0 mmHg. Aortic valve peak gradient measures 3.4 mmHg. Aortic valve area, by VTI measures 3.85 cm. Pulmonic Valve: The pulmonic valve was normal in structure. Pulmonic valve regurgitation is not visualized. No evidence of pulmonic stenosis. Aorta: The aortic root is normal in size and structure. Venous: The inferior vena cava is normal in size with greater than 50% respiratory variability, suggesting right atrial pressure of 3 mmHg. IAS/Shunts: No atrial level shunt detected by color flow Doppler.  LEFT  VENTRICLE PLAX 2D LVIDd:         5.10 cm      Diastology LVIDs:         3.50 cm      LV e' medial:    5.77 cm/s LV PW:         1.60 cm      LV E/e' medial:  17.7 LV IVS:        1.30 cm      LV e' lateral:   5.87 cm/s LVOT diam:     2.30 cm      LV E/e' lateral: 17.4 LV SV:         85 LV SV Index:   35 LVOT Area:     4.15 cm  LV Volumes (MOD) LV vol d, MOD A2C: 76.7 ml LV vol d, MOD A4C: 148.0 ml LV vol s, MOD A2C: 27.5 ml LV vol s, MOD A4C: 44.9 ml LV SV MOD A2C:     49.2 ml LV SV MOD A4C:     148.0 ml LV SV MOD BP:      78.5 ml LEFT ATRIUM             Index LA Vol (A2C):   53.3 ml 21.91 ml/m LA Vol (A4C):   99.6 ml 40.95 ml/m LA Biplane Vol: 74.3 ml 30.55 ml/m  AORTIC VALVE                    PULMONIC VALVE AV Area (Vmax):    3.97 cm     PV Vmax:       0.85 m/s AV Area (Vmean):   3.80 cm     PV Vmean:      59.600 cm/s AV Area (VTI):     3.85 cm     PV VTI:        0.214 m AV Vmax:           91.80 cm/s   PV Peak grad:  2.9 mmHg AV Vmean:          62.900 cm/s  PV Mean grad:  2.0 mmHg AV VTI:            0.221 m AV Peak Grad:      3.4 mmHg AV Mean Grad:      2.0 mmHg LVOT Vmax:  87.70 cm/s LVOT Vmean:        57.500 cm/s LVOT VTI:          0.205 m LVOT/AV VTI ratio: 0.93  AORTA Ao Root diam: 3.40 cm MITRAL VALVE MV Area (PHT): 2.79 cm     SHUNTS MV Decel Time: 272 msec     Systemic VTI:  0.20 m MV E velocity: 102.00 cm/s  Systemic Diam: 2.30 cm MV A velocity: 78.10 cm/s MV E/A ratio:  1.31 Ida Rogue MD Electronically signed by Ida Rogue MD Signature Date/Time: 03/07/2022/1:11:46 PM    Final    DG Chest 2 View  Result Date: 03/06/2022 CLINICAL DATA:  Chest pain EXAM: CHEST - 2 VIEW COMPARISON:  04/07/2020 FINDINGS: No pleural effusion. No pneumothorax. Postsurgical changes from median sternotomy and CABG. Unchanged appearance of the sternotomy wires without evidence of new wire fractures. Atherosclerotic calcifications of the aortic arch. Surgical clips in the lower neck and thoracic inlet.  Unchanged cardiac and mediastinal contours. No displaced rib fractures. Visualized upper abdomen is unremarkable. Degenerative changes of the left AC joint. Vertebral body heights are maintained. IMPRESSION: No radiographic finding to explain chest pain. Electronically Signed   By: Marin Roberts M.D.   On: 03/06/2022 11:47      Subjective: Seen and examined on the day of discharge.  Stable no distress.  Family members at bedside.  Stable for DC home.  Discharge Exam: Vitals:   03/09/22 0736 03/09/22 1211  BP: 135/76 113/75  Pulse: 60 (!) 57  Resp: 18 17  Temp: 97.7 F (36.5 C) 98.2 F (36.8 C)  SpO2: 95% 98%   Vitals:   03/09/22 0032 03/09/22 0449 03/09/22 0736 03/09/22 1211  BP: (!) 109/57 135/82 135/76 113/75  Pulse: 62 (!) 57 60 (!) 57  Resp: $Remo'20 20 18 17  'aFyRj$ Temp: 98.2 F (36.8 C) 98 F (36.7 C) 97.7 F (36.5 C) 98.2 F (36.8 C)  TempSrc: Oral Oral Oral Oral  SpO2: 97% 100% 95% 98%  Weight:      Height:        General: Pt is alert, awake, not in acute distress Cardiovascular: RRR, S1/S2 +, no rubs, no gallops Respiratory: CTA bilaterally, no wheezing, no rhonchi Abdominal: Soft, NT, ND, bowel sounds + Extremities: no edema, no cyanosis    The results of significant diagnostics from this hospitalization (including imaging, microbiology, ancillary and laboratory) are listed below for reference.     Microbiology: No results found for this or any previous visit (from the past 240 hour(s)).   Labs: BNP (last 3 results) Recent Labs    03/08/22 0831  BNP 048.8*   Basic Metabolic Panel: Recent Labs  Lab 03/06/22 1112 03/07/22 0717 03/08/22 0426 03/09/22 0451  NA 135 139 137 137  K 3.5 3.6 3.4* 3.9  CL 97* 104 104 104  CO2 $Re'26 28 25 27  'hLE$ GLUCOSE 219* 180* 174* 175*  BUN 30* 32* 31* 26*  CREATININE 1.51* 1.56* 1.50* 1.47*  CALCIUM 9.3 8.6* 8.6* 8.5*  MG  --   --   --  2.0   Liver Function Tests: No results for input(s): "AST", "ALT", "ALKPHOS", "BILITOT",  "PROT", "ALBUMIN" in the last 168 hours. No results for input(s): "LIPASE", "AMYLASE" in the last 168 hours. No results for input(s): "AMMONIA" in the last 168 hours. CBC: Recent Labs  Lab 03/06/22 1112 03/07/22 0504 03/08/22 0426 03/09/22 0451  WBC 10.8* 10.7* 10.2 11.8*  NEUTROABS  --   --   --  6.7  HGB 11.9* 9.4* 9.1* 9.6*  HCT 39.2 31.2* 29.6* 31.3*  MCV 81.8 83.0 82.0 81.7  PLT 310 266 254 252   Cardiac Enzymes: No results for input(s): "CKTOTAL", "CKMB", "CKMBINDEX", "TROPONINI" in the last 168 hours. BNP: Invalid input(s): "POCBNP" CBG: Recent Labs  Lab 03/08/22 1150 03/08/22 1627 03/08/22 2039 03/09/22 0735 03/09/22 1212  GLUCAP 210* 159* 201* 167* 172*   D-Dimer No results for input(s): "DDIMER" in the last 72 hours. Hgb A1c No results for input(s): "HGBA1C" in the last 72 hours. Lipid Profile No results for input(s): "CHOL", "HDL", "LDLCALC", "TRIG", "CHOLHDL", "LDLDIRECT" in the last 72 hours. Thyroid function studies Recent Labs    03/08/22 0426  TSH 24.430*   Anemia work up No results for input(s): "VITAMINB12", "FOLATE", "FERRITIN", "TIBC", "IRON", "RETICCTPCT" in the last 72 hours. Urinalysis    Component Value Date/Time   COLORURINE Yellow 09/22/2013 2248   APPEARANCEUR Clear 09/22/2013 2248   LABSPEC >1.060 09/22/2013 2248   PHURINE 5.0 09/22/2013 2248   GLUCOSEU Negative 09/22/2013 2248   HGBUR 1+ 09/22/2013 2248   BILIRUBINUR Negative 09/22/2013 2248   KETONESUR Negative 09/22/2013 2248   PROTEINUR Negative 09/22/2013 2248   NITRITE Negative 09/22/2013 2248   LEUKOCYTESUR Negative 09/22/2013 2248   Sepsis Labs Recent Labs  Lab 03/06/22 1112 03/07/22 0504 03/08/22 0426 03/09/22 0451  WBC 10.8* 10.7* 10.2 11.8*   Microbiology No results found for this or any previous visit (from the past 240 hour(s)).   Time coordinating discharge: Over 30 minutes  SIGNED:   Sidney Ace, MD  [facility] 03/09/2022, 3:08  PM Pager   If 7PM-7AM, please contact night-coverage

## 2022-03-09 NOTE — Evaluation (Signed)
Physical Therapy Evaluation Patient Details Name: Craig Reese MRN: [id number] DOB: Dec 19, 1960 Today's Date: 03/09/2022  History of Present Illness  Pt is a 61 y/o M admitted on 03/06/22 after presenting with c/o chest pain & SOB, as well as L arm numbness. Pt initially found to be in a-fib with RVR & hypotensive. Pt being treated for NSTEMI. PMH: CAD s/p PCI to SVG to OM1, HLD, HTN, HFrEF, severe aortic insufficiency s/p freestyle bioprosthetic aortic valve replacement (2015)  Clinical Impression  Pt seen for PT evaluation with pt agreeable to tx, in-person interpreter utilized during session Myer Peer). Pt reports prior to admission he was renting a room & living with a family. Pt stays on the 2nd level with a flight of stairs with R rail to access. Pt reports he doesn't work but was independent without AD. On this date, pt is independent with transfers, progresses to mod I with gait without AD but requires CGA<>supervision to negotiate stairs with 1-2 rails & frequent rest breaks 2/2 fatigue. Will continue to follow pt acutely to address strengthening, endurance, and stair negotiation. Anticipate pt will improve quickly as he continues to mobilize.       Recommendations for follow up therapy are one component of a multi-disciplinary discharge planning process, led by the attending physician.  Recommendations may be updated based on patient status, additional functional criteria and insurance authorization.  Follow Up Recommendations No PT follow up (MD will make cardiac rehab recommendation if pt is appropriate & MD feels this is appropriate recommendation)      Assistance Recommended at Discharge PRN  Patient can return home with the following  Help with stairs or ramp for entrance    Equipment Recommendations None recommended by PT  Recommendations for Other Services       Functional Status Assessment Patient has had a recent decline in their functional status and demonstrates  the ability to make significant improvements in function in a reasonable and predictable amount of time.     Precautions / Restrictions Precautions Precautions: None Restrictions Weight Bearing Restrictions: No      Mobility  Bed Mobility Overal bed mobility: Modified Independent             General bed mobility comments: supine>sit with HOB fully elevated    Transfers Overall transfer level: Independent Equipment used: None               General transfer comment: STS from EOB & recliner without assistance    Ambulation/Gait Ambulation/Gait assistance: Supervision, Modified independent (Device/Increase time) (supervision fade to mod I) Gait Distance (Feet): 300 Feet Assistive device: None Gait Pattern/deviations: Decreased weight shift to right, Decreased stride length, Decreased step length - left, Decreased step length - right Gait velocity: slightly decreased     General Gait Details: decreased RLE hip/knee flexion during swing phase  Stairs Stairs: Yes Stairs assistance: Supervision, Min guard Stair Management: One rail Right, Two rails Number of Stairs:  (6 + 6) General stair comments: Pt ascends step over step with R rail with supervision, utilizes B rail to descend steps with step to pattern with CGA fade to supervision. Pt requires rest break throughout stair negotiation 2/2 fatigue (reports this is new since being in the hospital).  Wheelchair Mobility    Modified Rankin (Stroke Patients Only)       Balance Overall balance assessment: Needs assistance Sitting-balance support: Feet supported Sitting balance-Leahy Scale: Normal     Standing balance support: During functional activity, No upper extremity  supported Standing balance-Leahy Scale: Good                               Pertinent Vitals/Pain Pain Assessment Pain Assessment: Faces Faces Pain Scale: Hurts even more Pain Location: midline chest pain Pain Descriptors /  Indicators: Stabbing Pain Intervention(s): Patient requesting pain meds-RN notified, RN gave pain meds during session, Monitored during session (nurse cleared pt for participation in PT)    Home Living Family/patient expects to be discharged to:: Private residence Living Arrangements:  (pt reports he lives with "a family" but notes it isn't his)   Type of Home: House Home Access: Stairs to enter   Technical brewer of Steps: 1 Alternate Level Stairs-Number of Steps: flight Home Layout: Two level;Bed/bath upstairs Home Equipment: None      Prior Function Prior Level of Function : Independent/Modified Independent             Mobility Comments: Pt reports several falls prior to admission but upon further conversation these episodes are actually of him "passing out" vs falling. Driving. Ambulates without AD.       Hand Dominance        Extremity/Trunk Assessment   Upper Extremity Assessment Upper Extremity Assessment: Overall WFL for tasks assessed    Lower Extremity Assessment Lower Extremity Assessment: Generalized weakness       Communication   Communication: Prefers language other than English;Interpreter utilized (in-person interpreter utilized Comptroller))  Cognition Arousal/Alertness: Awake/alert Behavior During Therapy: WFL for tasks assessed/performed, Flat affect Overall Cognitive Status: Within Functional Limits for tasks assessed                                          General Comments General comments (skin integrity, edema, etc.): HR 60 bpm at rest, increased to 74 bpm with gait    Exercises     Assessment/Plan    PT Assessment Patient needs continued PT services  PT Problem List Decreased strength;Cardiopulmonary status limiting activity;Decreased activity tolerance;Decreased balance;Decreased mobility       PT Treatment Interventions DME instruction;Therapeutic exercise;Gait training;Balance training;Stair  training;Neuromuscular re-education;Functional mobility training;Therapeutic activities;Patient/family education    PT Goals (Current goals can be found in the Care Plan section)  Acute Rehab PT Goals Patient Stated Goal: get stronger PT Goal Formulation: With patient Time For Goal Achievement: 03/23/22 Potential to Achieve Goals: Good    Frequency Min 2X/week     Co-evaluation               AM-PAC PT "6 Clicks" Mobility  Outcome Measure Help needed turning from your back to your side while in a flat bed without using bedrails?: None Help needed moving from lying on your back to sitting on the side of a flat bed without using bedrails?: None Help needed moving to and from a bed to a chair (including a wheelchair)?: None Help needed standing up from a chair using your arms (e.g., wheelchair or bedside chair)?: None Help needed to walk in hospital room?: None Help needed climbing 3-5 steps with a railing? : A Little 6 Click Score: 23    End of Session   Activity Tolerance: Patient tolerated treatment well Patient left: in chair;with call bell/phone within reach Nurse Communication: Mobility status PT Visit Diagnosis: Muscle weakness (generalized) (M62.81)    Time: 5093-2671 PT Time Calculation (min) (  ACUTE ONLY): 23 min   Charges:   PT Evaluation $PT Eval Moderate Complexity: [place], PT, DPT 03/09/22, 1:59 PM   Waunita Schooner 03/09/2022, 1:57 PM

## 2022-03-09 NOTE — Consult Note (Signed)
ANTICOAGULATION CONSULT NOTE  Pharmacy Consult for heparin Indication: chest pain/ACS  No Known Allergies  Patient Measurements: Height: 5\' 10"  (177.8 cm) Weight: 130 kg (286 lb 9.6 oz) IBW/kg (Calculated) : 73 Heparin Dosing Weight: 102.9 kg  Vital Signs: Temp: 98 F (36.7 C) (10/22 0449) Temp Source: Oral (10/22 0449) BP: 135/82 (10/22 0449) Pulse Rate: 57 (10/22 0449)  Labs: Recent Labs    03/06/22 1112 03/06/22 1233 03/06/22 1440 03/06/22 2358 03/07/22 0504 03/07/22 0717 03/07/22 1621 03/08/22 0014 03/08/22 0426 03/08/22 0835 03/08/22 1417 03/08/22 2118 03/09/22 0452  HGB 11.9*  --   --   --  9.4*  --   --   --  9.1*  --   --   --   --   HCT 39.2  --   --   --  31.2*  --   --   --  29.6*  --   --   --   --   PLT 310  --   --   --  266  --   --   --  254  --   --   --   --   APTT  --   --   --    < >  --  46* 33 40*  --    < > 56* 75* 57*  LABPROT  --   --   --   --  16.5*  --   --   --   --   --   --   --   --   INR  --   --   --   --  1.3*  --   --   --   --   --   --   --   --   HEPARINUNFRC  --   --   --    < >  --  1.05* 0.43 0.44  --   --   --   --  0.38  CREATININE 1.51*  --   --   --   --  1.56*  --   --  1.50*  --   --   --   --   TROPONINIHS 34* 48* 91*  --   --   --   --   --   --   --   --   --   --    < > = values in this interval not displayed.     Estimated Creatinine Clearance: 71.9 mL/min (A) (by C-G formula based on SCr of 1.5 mg/dL (H)).   Medical History: Past Medical History:  Diagnosis Date   AF (paroxysmal atrial fibrillation) (HCC) 06/06/2018   CAD (coronary artery disease) 05/13/2018   Chronic diastolic CHF (congestive heart failure) (HCC) 11/17/2018   Coronary artery disease    Diabetes mellitus type II, non insulin dependent (HCC) 11/17/2018   Hypothyroidism 11/17/2018    Medications:  Xarelto 20 mg PTA -- last dose 10/18 @ 1800  Assessment: 61 yo M with PMH CAD s/p PCI, HLD, HTN, HFrEF, severe aortic insufficiency s/p  bioprosthetic AV replacement (2015) presents with chest pain and SOB c/f NSTEMI. Troponins trending up currently, most recent value 91.  Baseline Labs: aPTT - unknown; INR - unknown (1.3 after starting UFH) Hgb - 11.9; Plts - 310  Date Time aPTT/HL  Rate/Comment 10/19 2358 HL >1.1  Supratherapeutic (d/t Xarelto PTA)  10/20  0717 HL 1.05  Supratherapeutic (d/t Xarelto PTA) 10/20  1621 aPTT 33; HL 0.43 Subtherapeutic; not correlating 10/21  0835 aPTT 73  Therapeutic x 1 10/21  1417 aPTT 56  Subtherapeutic 10/21 2118 aPTT 75  Therapeutic x1 10/22 0452 aPTT 57, HL 0.38     Goal of Therapy:  Heparin level 0.3-0.7 units/ml aPTT: 66-102 sec Monitor platelets by anticoagulation protocol: Yes   Plan: aPTT subtherapeutic Bolus 1500 units x 1 Increase heparin infusion to 1800 un/hr Recheck aPTT in 6 hrs after rate change. May titrate by HL alone once both values are correlating, Continue to monitor H&H and platelets daily while on heparin gtt.  Renda Rolls, PharmD, [facility] 03/09/2022 5:24 AM

## 2022-03-09 NOTE — Progress Notes (Addendum)
SUBJECTIVE: Patient is a 61 year old male with history of CAD, status post PCI to SVG to OM1, HLD, HTN, HFrEF, severe AS post bioprosthetic valve replacement in 2015. Patient presented to ED on 03/06/22 complaining of chest pain and shortness of breath. Found to be in atrial fibrillation with RVR.    Vitals:   03/09/22 0032 03/09/22 0449 03/09/22 0736 03/09/22 1211  BP: (!) 109/57 135/82 135/76 113/75  Pulse: 62 (!) 57 60 (!) 57  Resp: 20 20 18 17   Temp: 98.2 F (36.8 C) 98 F (36.7 C) 97.7 F (36.5 C) 98.2 F (36.8 C)  TempSrc: Oral Oral Oral Oral  SpO2: 97% 100% 95% 98%  Weight:      Height:        Intake/Output Summary (Last 24 hours) at 03/09/2022 1405 Last data filed at 03/09/2022 1211 Gross per 24 hour  Intake 584.38 ml  Output 1850 ml  Net -1265.62 ml    LABS: Basic Metabolic Panel: Recent Labs    03/08/22 0426 03/09/22 0451  NA 137 137  K 3.4* 3.9  CL 104 104  CO2 25 27  GLUCOSE 174* 175*  BUN 31* 26*  CREATININE 1.50* 1.47*  CALCIUM 8.6* 8.5*  MG  --  2.0   Liver Function Tests: No results for input(s): "AST", "ALT", "ALKPHOS", "BILITOT", "PROT", "ALBUMIN" in the last 72 hours. No results for input(s): "LIPASE", "AMYLASE" in the last 72 hours. CBC: Recent Labs    03/08/22 0426 03/09/22 0451  WBC 10.2 11.8*  NEUTROABS  --  6.7  HGB 9.1* 9.6*  HCT 29.6* 31.3*  MCV 82.0 81.7  PLT 254 252   Cardiac Enzymes: No results for input(s): "CKTOTAL", "CKMB", "CKMBINDEX", "TROPONINI" in the last 72 hours. BNP: Invalid input(s): "POCBNP" D-Dimer: No results for input(s): "DDIMER" in the last 72 hours. Hemoglobin A1C: No results for input(s): "HGBA1C" in the last 72 hours. Fasting Lipid Panel: No results for input(s): "CHOL", "HDL", "LDLCALC", "TRIG", "CHOLHDL", "LDLDIRECT" in the last 72 hours. Thyroid Function Tests: Recent Labs    03/08/22 0426  TSH 24.430*   Anemia Panel: No results for input(s): "VITAMINB12", "FOLATE", "FERRITIN", "TIBC",  "IRON", "RETICCTPCT" in the last 72 hours.   PHYSICAL EXAM General: Well developed, well nourished, in no acute distress HEENT:  Normocephalic and atramatic Neck:  No JVD.  Lungs: Clear bilaterally to auscultation and percussion. Heart: HRRR . Normal S1 and S2 without gallops or murmurs.  Abdomen: Bowel sounds are positive, abdomen soft and non-tender  Msk:  Back normal, normal gait. Normal strength and tone for age. Extremities: No clubbing, cyanosis or edema.   Neuro: Alert and oriented X 3. Psych:  Good affect, responds appropriately  TELEMETRY: sinus bradycardia, HR 57 bpm  ASSESSMENT AND PLAN: Patient resting comfortably in bed in no acute distress. Complains of stabbing chest pain in substernal area. Shortness of breath improved. Denies dizziness. Troponin levels peaked at 91. Heart rate and blood pressure controlled. Will add PPI to plan for substernal chest pain. Restart Xarelto on discharge for atrial fibrillation. Patient having near syncopal episodes, increased fall risk. Will defer starting aspirin or Plavix at this time. Patient can follow up with regular cardiologist at [facility].   Principal Problem:   NSTEMI (non-ST elevated myocardial infarction) ([place]) Active Problems:   CAD (coronary artery disease)   Chest pain   Type 2 diabetes mellitus with hyperlipidemia (HCC)   Hypothyroidism   HLD (hyperlipidemia)   Elevated troponin   HTN (hypertension)   AF (  paroxysmal atrial fibrillation) (HCC)   AKI (acute kidney injury) ([place])    Lavon Bothwell, FNP-C 03/09/2022 2:05 PM

## 2022-08-20 ENCOUNTER — Encounter: Payer: Self-pay | Admitting: Internal Medicine

## 2022-08-20 ENCOUNTER — Inpatient Hospital Stay
Admission: EM | Admit: 2022-08-20 | Discharge: 2022-08-22 | DRG: 155 | Disposition: A | Discharge status: Home / Self Care | Payer: Medicaid Other | Attending: Internal Medicine | Admitting: Internal Medicine

## 2022-08-20 ENCOUNTER — Other Ambulatory Visit: Payer: Self-pay

## 2022-08-20 ENCOUNTER — Emergency Department: Payer: Medicaid Other

## 2022-08-20 DIAGNOSIS — Z79899 Other long term (current) drug therapy: Secondary | ICD-10-CM

## 2022-08-20 DIAGNOSIS — E1122 Type 2 diabetes mellitus with diabetic chronic kidney disease: Secondary | ICD-10-CM | POA: Diagnosis present

## 2022-08-20 DIAGNOSIS — Z79890 Hormone replacement therapy (postmenopausal): Secondary | ICD-10-CM

## 2022-08-20 DIAGNOSIS — N183 Chronic kidney disease, stage 3 unspecified: Secondary | ICD-10-CM | POA: Insufficient documentation

## 2022-08-20 DIAGNOSIS — E1165 Type 2 diabetes mellitus with hyperglycemia: Secondary | ICD-10-CM | POA: Diagnosis present

## 2022-08-20 DIAGNOSIS — R079 Chest pain, unspecified: Principal | ICD-10-CM | POA: Diagnosis present

## 2022-08-20 DIAGNOSIS — Z5986 Financial insecurity: Secondary | ICD-10-CM

## 2022-08-20 DIAGNOSIS — I4891 Unspecified atrial fibrillation: Secondary | ICD-10-CM | POA: Diagnosis present

## 2022-08-20 DIAGNOSIS — I440 Atrioventricular block, first degree: Secondary | ICD-10-CM | POA: Diagnosis present

## 2022-08-20 DIAGNOSIS — G8929 Other chronic pain: Secondary | ICD-10-CM | POA: Diagnosis present

## 2022-08-20 DIAGNOSIS — I130 Hypertensive heart and chronic kidney disease with heart failure and stage 1 through stage 4 chronic kidney disease, or unspecified chronic kidney disease: Secondary | ICD-10-CM | POA: Diagnosis present

## 2022-08-20 DIAGNOSIS — R7989 Other specified abnormal findings of blood chemistry: Secondary | ICD-10-CM | POA: Diagnosis present

## 2022-08-20 DIAGNOSIS — I959 Hypotension, unspecified: Secondary | ICD-10-CM | POA: Diagnosis present

## 2022-08-20 DIAGNOSIS — E669 Obesity, unspecified: Secondary | ICD-10-CM | POA: Insufficient documentation

## 2022-08-20 DIAGNOSIS — I4510 Unspecified right bundle-branch block: Secondary | ICD-10-CM | POA: Diagnosis present

## 2022-08-20 DIAGNOSIS — Z8249 Family history of ischemic heart disease and other diseases of the circulatory system: Secondary | ICD-10-CM

## 2022-08-20 DIAGNOSIS — Z951 Presence of aortocoronary bypass graft: Secondary | ICD-10-CM

## 2022-08-20 DIAGNOSIS — I2489 Other forms of acute ischemic heart disease: Secondary | ICD-10-CM | POA: Diagnosis present

## 2022-08-20 DIAGNOSIS — Z7901 Long term (current) use of anticoagulants: Secondary | ICD-10-CM

## 2022-08-20 DIAGNOSIS — R9431 Abnormal electrocardiogram [ECG] [EKG]: Secondary | ICD-10-CM | POA: Diagnosis present

## 2022-08-20 DIAGNOSIS — G4761 Periodic limb movement disorder: Secondary | ICD-10-CM | POA: Diagnosis present

## 2022-08-20 DIAGNOSIS — R0600 Dyspnea, unspecified: Secondary | ICD-10-CM | POA: Insufficient documentation

## 2022-08-20 DIAGNOSIS — I25110 Atherosclerotic heart disease of native coronary artery with unstable angina pectoris: Secondary | ICD-10-CM | POA: Diagnosis present

## 2022-08-20 DIAGNOSIS — N1831 Chronic kidney disease, stage 3a: Secondary | ICD-10-CM | POA: Diagnosis present

## 2022-08-20 DIAGNOSIS — Z953 Presence of xenogenic heart valve: Secondary | ICD-10-CM

## 2022-08-20 DIAGNOSIS — E876 Hypokalemia: Secondary | ICD-10-CM | POA: Diagnosis present

## 2022-08-20 DIAGNOSIS — Z8679 Personal history of other diseases of the circulatory system: Secondary | ICD-10-CM

## 2022-08-20 DIAGNOSIS — Z85850 Personal history of malignant neoplasm of thyroid: Secondary | ICD-10-CM

## 2022-08-20 DIAGNOSIS — Z6837 Body mass index (BMI) 37.0-37.9, adult: Secondary | ICD-10-CM

## 2022-08-20 DIAGNOSIS — E039 Hypothyroidism, unspecified: Secondary | ICD-10-CM | POA: Diagnosis present

## 2022-08-20 DIAGNOSIS — G4733 Obstructive sleep apnea (adult) (pediatric): Principal | ICD-10-CM | POA: Diagnosis present

## 2022-08-20 DIAGNOSIS — I480 Paroxysmal atrial fibrillation: Secondary | ICD-10-CM | POA: Diagnosis present

## 2022-08-20 DIAGNOSIS — Z91148 Patient's other noncompliance with medication regimen for other reason: Secondary | ICD-10-CM

## 2022-08-20 DIAGNOSIS — I10 Essential (primary) hypertension: Secondary | ICD-10-CM | POA: Diagnosis present

## 2022-08-20 DIAGNOSIS — I2510 Atherosclerotic heart disease of native coronary artery without angina pectoris: Secondary | ICD-10-CM | POA: Diagnosis present

## 2022-08-20 DIAGNOSIS — E1169 Type 2 diabetes mellitus with other specified complication: Secondary | ICD-10-CM | POA: Diagnosis present

## 2022-08-20 DIAGNOSIS — Z7984 Long term (current) use of oral hypoglycemic drugs: Secondary | ICD-10-CM

## 2022-08-20 DIAGNOSIS — I5032 Chronic diastolic (congestive) heart failure: Secondary | ICD-10-CM | POA: Diagnosis present

## 2022-08-20 DIAGNOSIS — E785 Hyperlipidemia, unspecified: Secondary | ICD-10-CM | POA: Diagnosis present

## 2022-08-20 LAB — BASIC METABOLIC PANEL
Anion gap: 12 (ref 5–15)
BUN: 44 mg/dL — ABNORMAL HIGH (ref 6–20)
CO2: 25 mmol/L (ref 22–32)
Calcium: 9.5 mg/dL (ref 8.9–10.3)
Chloride: 100 mmol/L (ref 98–111)
Creatinine, Ser: 1.56 mg/dL — ABNORMAL HIGH (ref 0.61–1.24)
GFR, Estimated: 51 mL/min — ABNORMAL LOW (ref >60)
Glucose, Bld: 222 mg/dL — ABNORMAL HIGH (ref 70–99)
Potassium: 2.9 mmol/L — ABNORMAL LOW (ref 3.5–5.1)
Sodium: 137 mmol/L (ref 135–145)

## 2022-08-20 LAB — HEPATIC FUNCTION PANEL
ALT: 28 U/L (ref 0–44)
AST: 32 U/L (ref 15–41)
Albumin: 4 g/dL (ref 3.5–5.0)
Alkaline Phosphatase: 74 U/L (ref 38–126)
Bilirubin, Direct: <0.1 mg/dL (ref 0.0–0.2)
Total Bilirubin: 0.6 mg/dL (ref 0.3–1.2)
Total Protein: 8.4 g/dL — ABNORMAL HIGH (ref 6.5–8.1)

## 2022-08-20 LAB — CBC
HCT: 34 % — ABNORMAL LOW (ref 39.0–52.0)
Hemoglobin: 10.8 g/dL — ABNORMAL LOW (ref 13.0–17.0)
MCH: 25.4 pg — ABNORMAL LOW (ref 26.0–34.0)
MCHC: 31.8 g/dL (ref 30.0–36.0)
MCV: 80 fL (ref 80.0–100.0)
Platelets: 352 10*3/uL (ref 150–400)
RBC: 4.25 MIL/uL (ref 4.22–5.81)
RDW: 14.7 % (ref 11.5–15.5)
WBC: 8.7 10*3/uL (ref 4.0–10.5)
nRBC: 0 % (ref 0.0–0.2)

## 2022-08-20 LAB — CBG MONITORING, ED
Glucose-Capillary: 128 mg/dL — ABNORMAL HIGH (ref 70–99)
Glucose-Capillary: 143 mg/dL — ABNORMAL HIGH (ref 70–99)

## 2022-08-20 LAB — TROPONIN I (HIGH SENSITIVITY)
Troponin I (High Sensitivity): 50 ng/L — ABNORMAL HIGH (ref <18)
Troponin I (High Sensitivity): 54 ng/L — ABNORMAL HIGH (ref <18)

## 2022-08-20 LAB — MAGNESIUM: Magnesium: 2.2 mg/dL (ref 1.7–2.4)

## 2022-08-20 MED ORDER — SODIUM CHLORIDE 0.9 % IV SOLN: INTRAVENOUS | Status: DC

## 2022-08-20 MED ORDER — NITROGLYCERIN 2 % TD OINT: 1.0000 [in_us] | TOPICAL_OINTMENT | Freq: Four times a day (QID) | TRANSDERMAL | Status: DC | PRN

## 2022-08-20 MED ORDER — LOSARTAN POTASSIUM 50 MG PO TABS
100.0000 mg | ORAL_TABLET | Freq: Every day | ORAL | Status: DC
  Administered 2022-08-21 – 2022-08-22 (×2): 100 mg via ORAL
  Filled 2022-08-20 (×2): qty 2

## 2022-08-20 MED ORDER — LEVOTHYROXINE SODIUM 100 MCG PO TABS
300.0000 ug | ORAL_TABLET | Freq: Every day | ORAL | Status: DC
  Administered 2022-08-21 – 2022-08-22 (×2): 300 ug via ORAL
  Filled 2022-08-20 (×2): qty 3

## 2022-08-20 MED ORDER — POTASSIUM CHLORIDE CRYS ER 20 MEQ PO TBCR
40.0000 meq | EXTENDED_RELEASE_TABLET | Freq: Once | ORAL | Status: AC
  Administered 2022-08-20: 40 meq via ORAL
  Filled 2022-08-20: qty 2

## 2022-08-20 MED ORDER — GABAPENTIN 300 MG PO CAPS
300.0000 mg | ORAL_CAPSULE | Freq: Two times a day (BID) | ORAL | Status: DC
  Administered 2022-08-20 – 2022-08-22 (×4): 300 mg via ORAL
  Filled 2022-08-20 (×4): qty 1

## 2022-08-20 MED ORDER — BUTALBITAL-APAP-CAFFEINE 50-325-40 MG PO TABS
1.0000 | ORAL_TABLET | Freq: Four times a day (QID) | ORAL | Status: DC | PRN
  Administered 2022-08-20 – 2022-08-21 (×2): 1 via ORAL
  Filled 2022-08-20 (×3): qty 1

## 2022-08-20 MED ORDER — ACETAMINOPHEN 325 MG PO TABS
650.0000 mg | ORAL_TABLET | ORAL | Status: DC | PRN
  Administered 2022-08-20 – 2022-08-22 (×4): 650 mg via ORAL
  Filled 2022-08-20 (×4): qty 2

## 2022-08-20 MED ORDER — INSULIN ASPART 100 UNIT/ML IJ SOLN
0.0000 [IU] | Freq: Every day | INTRAMUSCULAR | Status: DC
  Administered 2022-08-20: 0 [IU] via SUBCUTANEOUS
  Administered 2022-08-21: 3 [IU] via SUBCUTANEOUS
  Filled 2022-08-20: qty 1

## 2022-08-20 MED ORDER — PANTOPRAZOLE SODIUM 40 MG IV SOLR: 40.0000 mg | Freq: Two times a day (BID) | INTRAVENOUS | Status: DC

## 2022-08-20 MED ORDER — SODIUM CHLORIDE 0.9 % IV BOLUS
1000.0000 mL | Freq: Once | INTRAVENOUS | Status: AC
  Administered 2022-08-20: 1000 mL via INTRAVENOUS

## 2022-08-20 MED ORDER — METOPROLOL TARTRATE 50 MG PO TABS
50.0000 mg | ORAL_TABLET | Freq: Two times a day (BID) | ORAL | Status: DC
  Administered 2022-08-21 – 2022-08-22 (×3): 50 mg via ORAL
  Filled 2022-08-20: qty 2
  Filled 2022-08-20: qty 1
  Filled 2022-08-20: qty 2

## 2022-08-20 MED ORDER — ONDANSETRON HCL 4 MG/2ML IJ SOLN: 4.0000 mg | Freq: Four times a day (QID) | INTRAMUSCULAR | Status: DC | PRN

## 2022-08-20 MED ORDER — SODIUM CHLORIDE 0.9 % IV BOLUS
500.0000 mL | Freq: Once | INTRAVENOUS | Status: AC
  Administered 2022-08-20: 500 mL via INTRAVENOUS

## 2022-08-20 MED ORDER — INSULIN ASPART 100 UNIT/ML IJ SOLN
0.0000 [IU] | Freq: Three times a day (TID) | INTRAMUSCULAR | Status: DC
  Administered 2022-08-21 (×2): 3 [IU] via SUBCUTANEOUS
  Administered 2022-08-21: 2 [IU] via SUBCUTANEOUS
  Administered 2022-08-22: 3 [IU] via SUBCUTANEOUS
  Administered 2022-08-22: 2 [IU] via SUBCUTANEOUS
  Administered 2022-08-22: 3 [IU] via SUBCUTANEOUS
  Filled 2022-08-20 (×6): qty 1

## 2022-08-20 MED ORDER — RIVAROXABAN 20 MG PO TABS
20.0000 mg | ORAL_TABLET | Freq: Every day | ORAL | Status: DC
  Administered 2022-08-20 – 2022-08-22 (×3): 20 mg via ORAL
  Filled 2022-08-20 (×3): qty 1

## 2022-08-20 MED ORDER — SPIRONOLACTONE 25 MG PO TABS
25.0000 mg | ORAL_TABLET | Freq: Every day | ORAL | Status: DC
  Administered 2022-08-21 – 2022-08-22 (×2): 25 mg via ORAL
  Filled 2022-08-20 (×2): qty 1

## 2022-08-20 MED ORDER — ATORVASTATIN CALCIUM 80 MG PO TABS
80.0000 mg | ORAL_TABLET | Freq: Every evening | ORAL | Status: DC
  Administered 2022-08-20 – 2022-08-22 (×3): 80 mg via ORAL
  Filled 2022-08-20: qty 1
  Filled 2022-08-20 (×2): qty 4

## 2022-08-20 MED ORDER — PANTOPRAZOLE SODIUM 20 MG PO TBEC
20.0000 mg | DELAYED_RELEASE_TABLET | Freq: Two times a day (BID) | ORAL | Status: DC
  Administered 2022-08-20 – 2022-08-22 (×4): 20 mg via ORAL
  Filled 2022-08-20 (×5): qty 1

## 2022-08-20 NOTE — Assessment & Plan Note (Signed)
This meets criteria for morbid obesity based on the presence of 1 or more chronic comorbidities. Patient has 37.88, with diabetes mellitus and hypertension and CAD. This complicates overall care and prognosis.

## 2022-08-20 NOTE — Assessment & Plan Note (Addendum)
With dry mucosa clinically Status post 2.5 L Infusion IV fluid discontinued as patient is receiving food from kitchen and drinking water on his own Strict I's and O's

## 2022-08-20 NOTE — Assessment & Plan Note (Signed)
Home levothyroxine 300 mcg daily before breakfast resumed

## 2022-08-20 NOTE — Assessment & Plan Note (Addendum)
Query untreated obstructive sleep apnea CPAP nightly will be ordered

## 2022-08-20 NOTE — Assessment & Plan Note (Signed)
Home amiodarone not resumed on admission due to prolonged QT

## 2022-08-20 NOTE — Hospital Course (Addendum)
Mr. Koal Martignetti is a 62 year old male with history of CAD status post CABG to SVG and OM1 in 2015, prior DES to pLAD, history of thyroid carcinoma status post lateral neck dissection, hyperlipidemia, hypertension, heart failure preserved ejection fraction, severe aortic valve stenosis status post open AVR in 2015, paroxysmal atrial fibrillation on rivaroxaban amiodarone and metoprolol, hypertension, non-insulin-dependent diabetes mellitus, obstructive sleep apnea, who presents to the emergency department for chief concerns of chest pain, shortness of breath.  Per ED note, EMS found patient in A-fib with RVR and converted back to sinus rhythm.  EMS gave patient aspirin 324 mg p.o. one-time dose.  Vitals in the ED showed temperature of 98.4, respiration rate 17, heart rate 62, blood pressure 83/52, SpO2 97% on room air.  Serum sodium 137, potassium 2.9, chloride 100, bicarb 25, BUN of 44, serum creatinine 1.56, EGFR 50, nonfasting blood glucose 222, WBC 8.7, hemoglobin 10.8, platelets of 352.  High sensitive troponin was initially 50.  Portable chest x-ray: Was read as no acute findings.  ED treatment: Potassium chloride 40 mEq p.o. one-time dose, sodium chloride 500 mL bolus x 3.

## 2022-08-20 NOTE — Assessment & Plan Note (Signed)
-   Insulin SSI with at bedtime coverage ordered ?- Goal inpatient blood glucose levels 140-180 ?

## 2022-08-20 NOTE — Assessment & Plan Note (Signed)
Patient had polysomnography with CPAP test on 09/27/2020, patient exhibited severe obstructive sleep apnea with AHI of 50.6 and oxygen saturation nadir of 80%.  Snoring was mild.  Patient exhibited poor sleep consolidation, periodic limb movements of sleep  Pulmonology has been consulted via secure chat and staff message to establish care for CPAP titration Patient may benefit from ENT surgical intervention or use of oral appliance based on clinical findings CPAP nightly ordered on admission

## 2022-08-20 NOTE — Assessment & Plan Note (Signed)
Appears compensated, and does not appear to be in acute exacerbation at this time

## 2022-08-20 NOTE — Assessment & Plan Note (Signed)
-   Atorvastatin 80 mg nightly resumed ?

## 2022-08-20 NOTE — H&P (Addendum)
History and Physical   Craig Reese [id number] DOB: 1960-06-27 DOA: 08/20/2022  PCP: System, Provider Not In  Patient coming from: Home via EMS  I have personally briefly reviewed patient's old medical records in [place].  Chief Concern: Chest pain  HPI: Mr. Craig Reese is a 62 year old male with history of CAD status post CABG to SVG and OM1 in 2015, prior DES to pLAD, history of thyroid carcinoma status post lateral neck dissection, hyperlipidemia, hypertension, heart failure preserved ejection fraction, severe aortic valve stenosis status post open AVR in 2015, paroxysmal atrial fibrillation on rivaroxaban amiodarone and metoprolol, hypertension, non-insulin-dependent diabetes mellitus, obstructive sleep apnea, who presents to the emergency department for chief concerns of chest pain, shortness of breath.  Per ED note, EMS found patient in A-fib with RVR and converted back to sinus rhythm.  EMS gave patient aspirin 324 mg p.o. one-time dose.  Vitals in the ED showed temperature of 98.4, respiration rate 17, heart rate 62, blood pressure 83/52, SpO2 97% on room air.  Serum sodium 137, potassium 2.9, chloride 100, bicarb 25, BUN of 44, serum creatinine 1.56, EGFR 50, nonfasting blood glucose 222, WBC 8.7, hemoglobin 10.8, platelets of 352.  High sensitive troponin was initially 50.  Portable chest x-ray: Was read as no acute findings.  ED treatment: Potassium chloride 40 mEq p.o. one-time dose, sodium chloride 500 mL bolus x 3. -------------------------- Spanish interpreter at bedside throughout the entire H&P process.  At bedside, at bedside, he is able to tell me his name, age, current location, currently calendar year.  He reports that he comes in for chief concerns of chest pain and shortness of breath.  He also has intermittent episodes of weakness.  This has been ongoing for about 2 years.  He reports the symptoms are similar to his prior presentation in October 2023  and recently March 2024 at [facility].  He reports that he had a procedure done to the right arm by cardiology however no stenting was done.  He reports the pain is substernal, shortness of breath, worse with exertion.  He feels generally weak.  He further endorses that he has been diagnosed with sleep apnea in 2022 however has never been prescribed a CPAP mask.  He states he has never seen a pulmonologist.  He reports the chest pain, shortness of breath, weakness has been persistent however no hospitalization thus far over the last 2 years has resulted any definitive treatment for him.  He denies trauma to his person, fever, cough, nausea, vomiting, dysuria, hematuria, diarrhea, swelling of his lower extremity, recent syncope or loss of consciousness.  Social history: He lives in a room that he rents. He denies tobacco, etoh, and recreational drug use.   ROS: Constitutional: no weight change, no fever ENT/Mouth: no sore throat, no rhinorrhea Eyes: no eye pain, no vision changes Cardiovascular: + chest pain, + dyspnea,  no edema, no palpitations Respiratory: no cough, no sputum, no wheezing Gastrointestinal: no nausea, no vomiting, no diarrhea, no constipation Genitourinary: no urinary incontinence, no dysuria, no hematuria Musculoskeletal: no arthralgias, no myalgias Skin: no skin lesions, no pruritus, Neuro: + weakness, no loss of consciousness, no syncope Psych: no anxiety, no depression, + decrease appetite Heme/Lymph: no bruising, no bleeding  ED Course: Discussed with emergency send provider, patient requiring hospitalization for chief concerns of atrial fibrillation with RVR and hypokalemia.  Assessment/Plan  Principal Problem:   Chest pain Active Problems:   CAD (coronary artery disease)   Atrial fibrillation  with RVR   Type 2 diabetes mellitus with hyperlipidemia   Hypothyroidism   Hypokalemia   Chronic diastolic CHF (congestive heart failure)   Prolonged QT  interval   HLD (hyperlipidemia)   Elevated troponin   HTN (hypertension)   AF (paroxysmal atrial fibrillation)   CKD stage 3a, GFR 45-59 ml/min   History of aortic valve stenosis   Dyspnea   Obesity (BMI 30-39.9)   Severe obstructive sleep apnea   Hypotension   Assessment and Plan:  * Chest pain Patient has had multiple hospital evaluation and admissions concerning chest pain and shortness of breath, differentials include untreated sleep apnea, anginal pain, medication noncompliance in setting of decreased understanding complicated by [place] language deficiency Of note on left heart cath done at [facility] on 07/31/2022, recommended medical management and possible etiology of his chronic chest pain and shortness of breath is related to obstructive sleep apnea However given patient's extensive history of CAD, patient may benefit from cardiology evaluation for medication optimization including antianginal medication Cardiology has been consulted via staff message in epic order for consideration of adding antianginal medication Pulmonologist routine consultation placed via staff message and epic order for establishing care and follow-up outpatient in order for patient to get CPAP machine Nitroglycerin ointment 1 inch every 6 hours as needed for chest pain, 2 days ordered Admit to telemetry cardiac, observation  Hypotension With dry mucosa clinically Status post 2.5 L Infusion IV fluid discontinued as patient is receiving food from kitchen and drinking water on his own Strict I's and O's  Severe obstructive sleep apnea Patient had polysomnography with CPAP test on 09/27/2020, patient exhibited severe obstructive sleep apnea with AHI of 50.6 and oxygen saturation nadir of 80%.  Snoring was mild.  Patient exhibited poor sleep consolidation, periodic limb movements of sleep  Pulmonology has been consulted via secure chat and staff message to establish care for CPAP titration Patient may  benefit from ENT surgical intervention or use of oral appliance based on clinical findings CPAP nightly ordered on admission  Obesity (BMI 30-39.9) This meets criteria for morbid obesity based on the presence of 1 or more chronic comorbidities. Patient has 37.88, with diabetes mellitus and hypertension and CAD. This complicates overall care and prognosis.   Dyspnea Query untreated obstructive sleep apnea CPAP nightly will be ordered  History of aortic valve stenosis Status post bioprosthetic aortic valve replacement in 2015  CKD stage 3a, GFR 45-59 ml/min At baseline  AF (paroxysmal atrial fibrillation) Resumed home Xarelto 20 mg daily with supper, metoprolol tartrate 50 mg p.o. twice daily Amiodarone not resumed on admission due to prolonged QT on admission AM team to reinitiate when the benefits outweigh the risk  HTN (hypertension) Losartan 100 mg daily, metoprolol tartrate 50 mg p.o. twice daily, spironolactone 25 mg p.o. twice daily resumed for 08/21/2022 as patient has low normotensive on admission and has dry mucosal   Elevated troponin Initial high sensitive troponin was elevated at 50, second high sensitive troponin is pending   HLD (hyperlipidemia) Atorvastatin 80 mg nightly resumed  Prolonged QT interval Home amiodarone not resumed on admission due to prolonged QT  Chronic diastolic CHF (congestive heart failure) Appears compensated, and does not appear to be in acute exacerbation at this time  Hypokalemia Status post potassium chloride 40 mill equivalent p.o. one-time dose per EDP Check serum magnesium level on admission and in the a.m., we will replace as appropriate Repeat BMP in the a.m.  Hypothyroidism Home levothyroxine 300 mcg daily  before breakfast resumed  Type 2 diabetes mellitus with hyperlipidemia Insulin SSI with at bedtime coverage ordered Goal inpatient blood glucose levels 140-180  Chart reviewed.   Patient was at [facility] and received  left heart cath on 07/31/2022:  No PCI was done at that time.  Recommendation was medical management.  Patient had complete echo at [facility] on 07/16/2022: Estimated ejection fraction was greater than 55%, aortic valve replacement.  Aortic valve Doppler indices are consistent with normal prosthetic valve function.  Right ventricle is normal in size with normal systolic function.  Hospitalization from 03/06/2022 to 03/09/2022: Patient was admitted for chest pain, shortness of breath.  Cardiology was consulted for further optimization of medications.  Spironolactone was added at that time on 03/08/2022 due to echo showing heart failure preserved ejection fraction.  Complete echo on 03/07/2022: Was read as estimated ejection fraction of 50 to [id number], grade 2 diastolic dysfunction which is increased from grade 1 diastolic dysfunction from echo on 04/08/2020.  DVT prophylaxis: Xarelto 20 mg daily Code Status: Full code Diet: Heart healthy/carb modified Family Communication: no Disposition Plan: Pending clinical course Consults called: Cardiology, pulmonology Admission status: Telemetry cardiac, observation  Past Medical History:  Diagnosis Date   AF (paroxysmal atrial fibrillation) 06/06/2018   CAD (coronary artery disease) 05/13/2018   Chronic diastolic CHF (congestive heart failure) 11/17/2018   Coronary artery disease    Diabetes mellitus type II, non insulin dependent 11/17/2018   Hypothyroidism 11/17/2018   Past Surgical History:  Procedure Laterality Date   CORONARY ARTERY BYPASS GRAFT     LEFT HEART CATH AND CORS/GRAFTS ANGIOGRAPHY N/A 05/17/2018   Procedure: LEFT HEART CATH AND CORS/GRAFTS ANGIOGRAPHY and possible PCI and stent;  Surgeon: Yolonda Kida, MD;  Location: [place] [facility];  Service: Cardiovascular;  Laterality: N/A;   Social History:  reports that he has never smoked. He has never used smokeless tobacco. He reports that he does not currently use alcohol. He reports  that he does not use drugs.  No Known Allergies Family History  Problem Relation Age of Onset   Heart disease Brother    Family history: Family history reviewed and not pertinent.  Prior to Admission medications   Medication Sig Start Date End Date Taking? Authorizing Provider  acetaminophen (TYLENOL) 500 MG tablet Take 500-1,000 mg by mouth every 6 (six) hours as needed for mild pain or fever.    [provider]  amiodarone (PACERONE) 200 MG tablet One tab po twice a day for one week then one tablet daily afterwards Patient taking differently: Take 200 mg by mouth at bedtime. 04/08/20   Loletha Grayer, MD  atorvastatin (LIPITOR) 80 MG tablet Take 80 mg by mouth every evening.    [provider]  canagliflozin (INVOKANA) 100 MG TABS tablet Take 100 mg by mouth daily before breakfast.    [provider]  furosemide (LASIX) 40 MG tablet Take 1 tablet (40 mg total) by mouth daily. 03/09/22   Sidney Ace, MD  gabapentin (NEURONTIN) 300 MG capsule Take 300 mg by mouth 2 (two) times daily.    [provider]  hydrALAZINE (APRESOLINE) 50 MG tablet Take 100 mg by mouth 2 (two) times daily.    [provider]  levothyroxine (SYNTHROID) 200 MCG tablet Take 1 tablet (200 mcg total) by mouth daily before breakfast. 03/10/22 04/09/22  Ralene Muskrat B, MD  losartan (COZAAR) 100 MG tablet Take 100 mg by mouth daily.    [provider]  metFORMIN (GLUCOPHAGE) 1000 MG tablet Take 1,000 mg by mouth 2 (two) times daily with a meal.    [provider]  metoprolol tartrate (LOPRESSOR) 50 MG tablet Take 1 tablet (50 mg total) by mouth 2 (two) times daily. 11/21/18   Elgergawy, Silver Huguenin, MD  pantoprazole (PROTONIX) 20 MG tablet Take 1 tablet (20 mg total) by mouth 2 (two) times daily. 03/09/22 04/08/22  Sidney Ace, MD  rivaroxaban (XARELTO) 20 MG TABS tablet Take 1 tablet (20 mg total) by mouth daily. Patient taking differently:  Take 20 mg by mouth daily with supper. 04/08/20   Loletha Grayer, MD  spironolactone (ALDACTONE) 25 MG tablet Take 1 tablet (25 mg total) by mouth daily. 03/10/22 04/09/22  Sidney Ace, MD  sucralfate (CARAFATE) 1 GM/10ML suspension Take 1 g by mouth 4 (four) times daily.    [provider]   Physical Exam: Vitals:   08/20/22 1642 08/20/22 1651 08/20/22 1701 08/20/22 1736  BP: 104/62  (!) 109/59 118/63  Pulse: 62  (!) 58 (!) 57  Resp: 12  11 11   Temp:  98.7 F (37.1 C)    TempSrc:  Oral    SpO2: 100%  100% 100%  Weight:      Height:       Constitutional: appears age-appropriate, NAD, calm, comfortable Eyes: PERRL, lids and conjunctivae normal ENMT: Mucous membranes are dry. Posterior pharynx clear of any exudate or lesions. Age-appropriate dentition. Hearing appropriate Neck: normal, supple, no masses, no thyromegaly Respiratory: clear to auscultation bilaterally, no wheezing, no crackles. Normal respiratory effort. No accessory muscle use.  Cardiovascular: Regular rate and rhythm, no murmurs / rubs / gallops. No extremity edema. 2+ pedal pulses. No carotid bruits.  Abdomen: Morbidly obese abdomen, no tenderness, no masses palpated, no hepatosplenomegaly. Bowel sounds positive.  Musculoskeletal: no clubbing / cyanosis. No joint deformity upper and lower extremities. Good ROM, no contractures, no atrophy. Normal muscle tone.  Skin: no rashes, lesions, ulcers. No induration Neurologic: Sensation intact. Strength 5/5 in all 4.  Psychiatric: Normal judgment and insight. Alert and oriented x 3.  Depressed mood.   EKG: independently reviewed, showing sinus rhythm with rate of 70, first-degree AV block, right bundle branch block, QTc 535, relatively unchanged compared to EKG on 03/06/2022  Chest x-ray on Admission: I personally reviewed and I agree with radiologist reading as below.  DG Chest Port 1 View  Result Date: 08/20/2022 CLINICAL DATA:  Chest pain, hypotension,  weakness. Atrial fibrillation. EXAM: PORTABLE CHEST 1 VIEW COMPARISON:  03/08/2022 and CT chest 04/07/2020. FINDINGS: Trachea is midline. Heart is enlarged, stable. Thoracic aorta is calcified. Lungs are clear. No pleural. IMPRESSION: No acute findings. Electronically Signed   By: Lorin Picket M.D.   On: 08/20/2022 13:43    Labs on Admission: I have personally reviewed following labs CBC: Recent Labs  Lab 08/20/22 1319  WBC 8.7  HGB 10.8*  HCT 34.0*  MCV 80.0  PLT [id number]   Basic Metabolic Panel: Recent Labs  Lab 08/20/22 1319  NA 137  K 2.9*  CL 100  CO2 25  GLUCOSE 222*  BUN 44*  CREATININE 1.56*  CALCIUM 9.5  MG 2.2   GFR: Estimated Creatinine Clearance: 66.1 mL/min (A) (by C-G formula based on SCr of 1.56 mg/dL (H)).  Liver Function Tests: Recent Labs  Lab 08/20/22 1319  AST 32  ALT 28  ALKPHOS 74  BILITOT 0.6  PROT 8.4*  ALBUMIN 4.0   CBG: Recent Labs  Lab 08/20/22 1653  GLUCAP 128*   Urine analysis:    Component Value Date/Time   COLORURINE Yellow 09/22/2013 2248   APPEARANCEUR Clear 09/22/2013 2248   LABSPEC >1.060 09/22/2013 2248   PHURINE 5.0 09/22/2013 2248   GLUCOSEU Negative 09/22/2013 2248   HGBUR 1+ 09/22/2013 2248   BILIRUBINUR Negative 09/22/2013 2248   KETONESUR Negative 09/22/2013 2248   PROTEINUR Negative 09/22/2013 2248   NITRITE Negative 09/22/2013 2248   LEUKOCYTESUR Negative 09/22/2013 2248   This document was prepared using Dragon Voice Recognition software and may include unintentional dictation errors.  Dr. Tobie Poet [facility]  If 7PM-7AM, please contact overnight-coverage provider If 7AM-7PM, please contact day coverage provider [email or web address]  08/20/2022, 6:35 PM

## 2022-08-20 NOTE — Assessment & Plan Note (Signed)
At baseline 

## 2022-08-20 NOTE — ED Notes (Signed)
Assumed care from [place]. Pt resting comfortably in bed at this time. Pt denies any current needs or questions. Call light with in reach.

## 2022-08-20 NOTE — ED Notes (Signed)
Lab called to add on hepatic function and mag at this time.

## 2022-08-20 NOTE — Assessment & Plan Note (Signed)
Resumed home Xarelto 20 mg daily with supper, metoprolol tartrate 50 mg p.o. twice daily Amiodarone not resumed on admission due to prolonged QT on admission AM team to reinitiate when the benefits outweigh the risk

## 2022-08-20 NOTE — ED Notes (Signed)
Cox, MD at bedside with interpreter.

## 2022-08-20 NOTE — ED Notes (Signed)
DO Cox notified pt complaining of a headache post tylenol administration.

## 2022-08-20 NOTE — ED Provider Notes (Signed)
Care assumed of patient from outgoing provider.  See their note for initial history, exam and plan.  Clinical Course as of 08/20/22 1552  Wed Aug 20, 2022  1342 Chest x-ray on my review and interpretation without evidence of pneumothorax or consolidation. [PR]  1424 Troponin is mildly elevated.  Patient still complaining of discomfort.  I think a lot of this is secondary to demand ischemia in the setting of A-fib with RVR but he is no longer in A-fib with RVR and still complaining of chest discomfort with significant history of CAD and had essentially identical presentation 5 months ago. [PR]  1440 Patient hypotensive receiving IV fluids.  Do anticipate he is going require hospitalization will see if he is fluid responsive to determine appropriate level of care. [PR]  1516 Afib w/ RVR and AS - now patient is in NSR.  Trop mildly elevated at 50. BP now low and will need admission. CXR negative. Concern for cardiac etiology of hypotension [SM]    Clinical Course User Index [PR] Merlyn Lot, MD [SM] Nathaniel Man, MD     Nathaniel Man, MD 08/20/22 [phone]

## 2022-08-20 NOTE — Assessment & Plan Note (Signed)
Losartan 100 mg daily, metoprolol tartrate 50 mg p.o. twice daily, spironolactone 25 mg p.o. twice daily resumed for 08/21/2022 as patient has low normotensive on admission and has dry mucosal

## 2022-08-20 NOTE — Assessment & Plan Note (Signed)
Initial high sensitive troponin was elevated at 50, second high sensitive troponin is pending

## 2022-08-20 NOTE — ED Triage Notes (Signed)
Pt here via [facility] with cp. Pt was in a fib RVR on ems arrival and has converted back to SR. Pt has hx of DM and a fib, unsure of if pt is on medication. 324 asa given by ems. 20G LAC, fluids hanging.    112/61 97.6-ax 290-cbg 28 23-end tidal 100% RA

## 2022-08-20 NOTE — ED Provider Notes (Signed)
[facility] Provider Note    Event Date/Time   First MD Initiated Contact with Patient 08/20/22 1323     (approximate)   History   Chest Pain   HPI  Craig Reese is a 62 y.o. male resents to the ER for evaluation midsternal chest pain and pressure.  Had similar presentation roughly 5 months ago.  Denies any fevers.  EMS found the patient complaining of discomfort to his left jaw.  Was found to be in A-fib with RVR.  Has been compliant with his medications he is on blood thinners.  Denies any cough or chills.     Physical Exam   Triage Vital Signs: ED Triage Vitals  Enc Vitals Group     BP 08/20/22 1311 (!) 75/52     Pulse Rate 08/20/22 1311 70     Resp 08/20/22 1311 18     Temp 08/20/22 1311 98.4 F (36.9 C)     Temp src --      SpO2 08/20/22 1311 96 %     Weight 08/20/22 1317 264 lb (119.7 kg)     Height 08/20/22 1317 5\' 10"  (1.778 m)     Head Circumference --      Peak Flow --      Pain Score 08/20/22 1316 3     Pain Loc --      Pain Edu? --      Excl. in [place]? --     Most recent vital signs: Vitals:   08/20/22 1457 08/20/22 1500  BP: (!) 85/59 (!) 99/54  Pulse: 62 61  Resp: 12 17  Temp:    SpO2: 92% 96%     Constitutional: Alert  Eyes: Conjunctivae are normal.  Head: Atraumatic. Nose: No congestion/rhinnorhea. Mouth/Throat: Mucous membranes are moist.   Neck: Painless ROM.  Cardiovascular:   Good peripheral circulation. Respiratory: Normal respiratory effort.  No retractions.  Gastrointestinal: Soft and nontender.  Musculoskeletal:  no deformity Neurologic:  MAE spontaneously. No gross focal neurologic deficits are appreciated.  Skin:  Skin is warm, dry and intact. No rash noted. Psychiatric: Mood and affect are normal. Speech and behavior are normal.    ED Results / Procedures / Treatments   Labs (all labs ordered are listed, but only abnormal results are displayed) Labs Reviewed  BASIC METABOLIC PANEL - Abnormal;  Notable for the following components:      Result Value   Potassium 2.9 (*)    Glucose, Bld 222 (*)    BUN 44 (*)    Creatinine, Ser 1.56 (*)    GFR, Estimated 51 (*)    All other components within normal limits  CBC - Abnormal; Notable for the following components:   Hemoglobin 10.8 (*)    HCT 34.0 (*)    MCH 25.4 (*)    All other components within normal limits  TROPONIN I (HIGH SENSITIVITY) - Abnormal; Notable for the following components:   Troponin I (High Sensitivity) 50 (*)    All other components within normal limits  TROPONIN I (HIGH SENSITIVITY)     EKG  ED ECG REPORT I, Merlyn Lot, the attending physician, personally viewed and interpreted this ECG.   Date: 08/20/2022  EKG Time: 13:09  Rate: 70  Rhythm: sinus  Axis: left  Intervals: prolonged qt  ST&T Change: no stemi, nonspecific st abn    RADIOLOGY Please see ED Course for my review and interpretation.  I personally reviewed all radiographic images ordered to evaluate for  the above acute complaints and reviewed radiology reports and findings.  These findings were personally discussed with the patient.  Please see medical record for radiology report.    PROCEDURES:  Critical Care performed: No  Procedures   MEDICATIONS ORDERED IN ED: Medications  sodium chloride 0.9 % bolus 500 mL (has no administration in time range)  sodium chloride 0.9 % bolus 500 mL (500 mLs Intravenous New Bag/Given 08/20/22 1330)  sodium chloride 0.9 % bolus 500 mL (500 mLs Intravenous New Bag/Given 08/20/22 1427)  potassium chloride SA (KLOR-CON M) CR tablet 40 mEq (40 mEq Oral Given 08/20/22 1439)     IMPRESSION / MDM / ASSESSMENT AND PLAN / ED COURSE  I reviewed the triage vital signs and the nursing notes.                              Differential diagnosis includes, but is not limited to, ACS, pericarditis, esophagitis, boerhaaves, pe, dissection, pna, bronchitis, costochondritis  Patient presenting to the ER for  evaluation of symptoms as described above.  Based on symptoms, risk factors and considered above differential, this presenting complaint could reflect a potentially life-threatening illness therefore the patient will be placed on continuous pulse oximetry and telemetry for monitoring.  Laboratory evaluation will be sent to evaluate for the above complaints.      Clinical Course as of 08/20/22 1528  Wed Aug 20, 2022  1342 Chest x-ray on my review and interpretation without evidence of pneumothorax or consolidation. [PR]  1424 Troponin is mildly elevated.  Patient still complaining of discomfort.  I think a lot of this is secondary to demand ischemia in the setting of A-fib with RVR but he is no longer in A-fib with RVR and still complaining of chest discomfort with significant history of CAD and had essentially identical presentation 5 months ago. [PR]  1440 Patient hypotensive receiving IV fluids.  Do anticipate he is going require hospitalization will see if he is fluid responsive to determine appropriate level of care. [PR]  1516 Afib w/ RVR and AS - now patient is in NSR.  Trop mildly elevated at 50. BP now low and will need admission. CXR negative. Concern for cardiac etiology of hypotension [SM]    Clinical Course User Index [PR] Merlyn Lot, MD [SM] Nathaniel Man, MD     FINAL CLINICAL IMPRESSION(S) / ED DIAGNOSES   Final diagnoses:  Chest pain, unspecified type     Rx / DC Orders   ED Discharge Orders     None        Note:  This document was prepared using Dragon voice recognition software and may include unintentional dictation errors.    Merlyn Lot, MD 08/20/22 [phone]

## 2022-08-20 NOTE — Assessment & Plan Note (Signed)
Status post potassium chloride 40 mill equivalent p.o. one-time dose per EDP Check serum magnesium level on admission and in the a.m., we will replace as appropriate Repeat BMP in the a.m.

## 2022-08-20 NOTE — ED Notes (Signed)
Quentin Cornwall, MD at bedside with interpreter assessing patient.

## 2022-08-20 NOTE — ED Triage Notes (Signed)
Pt to ED [facility] for cp one hour. Pt hypotensive, reports weakness. +cardiac hx

## 2022-08-20 NOTE — Assessment & Plan Note (Signed)
Status post bioprosthetic aortic valve replacement in 2015

## 2022-08-20 NOTE — Assessment & Plan Note (Addendum)
Patient has had multiple hospital evaluation and admissions concerning chest pain and shortness of breath, differentials include untreated sleep apnea, anginal pain, medication noncompliance in setting of decreased understanding complicated by [place] language deficiency Of note on left heart cath done at [facility] on 07/31/2022, recommended medical management and possible etiology of his chronic chest pain and shortness of breath is related to obstructive sleep apnea However given patient's extensive history of CAD, patient may benefit from cardiology evaluation for medication optimization including antianginal medication Cardiology has been consulted via staff message in epic order for consideration of adding antianginal medication Pulmonologist routine consultation placed via staff message and epic order for establishing care and follow-up outpatient in order for patient to get CPAP machine Nitroglycerin ointment 1 inch every 6 hours as needed for chest pain, 2 days ordered Admit to telemetry cardiac, observation

## 2022-08-21 ENCOUNTER — Observation Stay (HOSPITAL_COMMUNITY)
Admit: 2022-08-21 | Discharge: 2022-08-21 | Disposition: A | Discharge status: Home / Self Care | Payer: Medicaid Other | Attending: Medical | Admitting: Medical

## 2022-08-21 DIAGNOSIS — R0789 Other chest pain: Secondary | ICD-10-CM | POA: Diagnosis not present

## 2022-08-21 DIAGNOSIS — Z7901 Long term (current) use of anticoagulants: Secondary | ICD-10-CM | POA: Diagnosis not present

## 2022-08-21 DIAGNOSIS — Z953 Presence of xenogenic heart valve: Secondary | ICD-10-CM | POA: Diagnosis not present

## 2022-08-21 DIAGNOSIS — E1122 Type 2 diabetes mellitus with diabetic chronic kidney disease: Secondary | ICD-10-CM | POA: Diagnosis present

## 2022-08-21 DIAGNOSIS — E1169 Type 2 diabetes mellitus with other specified complication: Secondary | ICD-10-CM | POA: Diagnosis present

## 2022-08-21 DIAGNOSIS — R079 Chest pain, unspecified: Secondary | ICD-10-CM | POA: Diagnosis present

## 2022-08-21 DIAGNOSIS — I440 Atrioventricular block, first degree: Secondary | ICD-10-CM | POA: Diagnosis present

## 2022-08-21 DIAGNOSIS — E785 Hyperlipidemia, unspecified: Secondary | ICD-10-CM | POA: Diagnosis present

## 2022-08-21 DIAGNOSIS — Z79890 Hormone replacement therapy (postmenopausal): Secondary | ICD-10-CM | POA: Diagnosis not present

## 2022-08-21 DIAGNOSIS — Z79899 Other long term (current) drug therapy: Secondary | ICD-10-CM | POA: Diagnosis not present

## 2022-08-21 DIAGNOSIS — I480 Paroxysmal atrial fibrillation: Secondary | ICD-10-CM

## 2022-08-21 DIAGNOSIS — G4733 Obstructive sleep apnea (adult) (pediatric): Secondary | ICD-10-CM | POA: Diagnosis not present

## 2022-08-21 DIAGNOSIS — Z7984 Long term (current) use of oral hypoglycemic drugs: Secondary | ICD-10-CM | POA: Diagnosis not present

## 2022-08-21 DIAGNOSIS — I2489 Other forms of acute ischemic heart disease: Secondary | ICD-10-CM | POA: Diagnosis present

## 2022-08-21 DIAGNOSIS — E039 Hypothyroidism, unspecified: Secondary | ICD-10-CM | POA: Diagnosis present

## 2022-08-21 DIAGNOSIS — I130 Hypertensive heart and chronic kidney disease with heart failure and stage 1 through stage 4 chronic kidney disease, or unspecified chronic kidney disease: Secondary | ICD-10-CM | POA: Diagnosis present

## 2022-08-21 DIAGNOSIS — G4761 Periodic limb movement disorder: Secondary | ICD-10-CM | POA: Diagnosis present

## 2022-08-21 DIAGNOSIS — E876 Hypokalemia: Secondary | ICD-10-CM | POA: Diagnosis present

## 2022-08-21 DIAGNOSIS — Z8249 Family history of ischemic heart disease and other diseases of the circulatory system: Secondary | ICD-10-CM | POA: Diagnosis not present

## 2022-08-21 DIAGNOSIS — Z951 Presence of aortocoronary bypass graft: Secondary | ICD-10-CM | POA: Diagnosis not present

## 2022-08-21 DIAGNOSIS — I5032 Chronic diastolic (congestive) heart failure: Secondary | ICD-10-CM | POA: Diagnosis present

## 2022-08-21 DIAGNOSIS — I25110 Atherosclerotic heart disease of native coronary artery with unstable angina pectoris: Secondary | ICD-10-CM | POA: Diagnosis present

## 2022-08-21 DIAGNOSIS — N1831 Chronic kidney disease, stage 3a: Secondary | ICD-10-CM | POA: Diagnosis present

## 2022-08-21 DIAGNOSIS — I959 Hypotension, unspecified: Secondary | ICD-10-CM | POA: Diagnosis present

## 2022-08-21 LAB — ECHOCARDIOGRAM COMPLETE
AR max vel: 1.14 cm2
AV Area VTI: 1.24 cm2
AV Area mean vel: 1.05 cm2
AV Mean grad: 29.5 mmHg
AV Peak grad: 48.7 mmHg
Ao pk vel: 3.49 m/s
Area-P 1/2: 2.73 cm2
Height: 70 in
MV VTI: 2.65 cm2
S' Lateral: 2.8 cm
Weight: 4224 oz

## 2022-08-21 LAB — TSH: TSH: 1.492 u[IU]/mL (ref 0.350–4.500)

## 2022-08-21 LAB — CBG MONITORING, ED
Glucose-Capillary: 158 mg/dL — ABNORMAL HIGH (ref 70–99)
Glucose-Capillary: 180 mg/dL — ABNORMAL HIGH (ref 70–99)
Glucose-Capillary: 147 mg/dL — ABNORMAL HIGH (ref 70–99)
Glucose-Capillary: 254 mg/dL — ABNORMAL HIGH (ref 70–99)

## 2022-08-21 LAB — MAGNESIUM: Magnesium: 2.1 mg/dL (ref 1.7–2.4)

## 2022-08-21 MED ORDER — PERFLUTREN LIPID MICROSPHERE
1.0000 mL | INTRAVENOUS | Status: AC | PRN
  Administered 2022-08-21: 5 mL via INTRAVENOUS

## 2022-08-21 NOTE — Consult Note (Signed)
Cardiology Consultation   Patient ID: Craig Reese MRN: [id number]; DOB: 04/09/1961  Admit date: 08/20/2022 Date of Consult: 08/21/2022  PCP:  System, Provider Not In   [place] Providers Cardiologist: [facility]   Patient Profile:   Craig Reese is a 62 y.o. male with a hx of CAD s/p CABG in 2015, HFimpEF, HTN, HLD, s/p AVR (aortic valve 99mm freestyle implanted 10/12/13 during CABG), CKD stage 3, paroxysmal Afib on Xarelto, DM2, OSA, thyroid carcinoma who is being seen 08/21/2022 for the evaluation of chest pain at the request of Dr. Priscella Mann.  History of Present Illness:   Craig Reese is followed by [facility] for the above cardiac issues. H/o CAD previous PCI and s/p CABG in 2015 with SVG to OM. H/o severe AS and endocarditis s/p AVR in 2015. H/o Paroxysmal Afib on amiodarone and Xarelto, concern for prolonged Qtc. EF as low as 25-30% post-CABG in2015 with normalization of EF. Most recent echo showed LVEF 55% and normally functioning valve. Patient has a long history of unstable angina. Unsure if he is taking Imdur.   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 He reported chest and DOE. He underwent echo and cardiac cath. Echo 06/2022 showed LVEF >55%, normal prosthetic valve function. LHC 07/31/22 showed unchanged CAD from cath in 2022. He has proximal SVG disease and LAD disease that is moderate and unchanged. Medical management was recommended.   The patient presented to the ER at [facility] for chest pain. He reported sudden onset after taking a shower. He was sitting down when the pain started. He had associated DOE. This was his chronic unstable angina. He called EMS who reported he was in rapid afib, unable to find EKG tracings.   In the ER BP 83/52, 97%O2. Labs showed sodium 137, K 2.9, Scr 1.56, BUN 44, BG 222, WBC 8.7, Hgb 10.8, plt 352. HS trop 50>52. Tele shows SB/SR and no evidence of Afib. CXR non-acute. EKG showed NSR,  RBBB with nonspecific ST changes.The patient was admitted for further work-up.   Past Medical History:  Diagnosis Date   AF (paroxysmal atrial fibrillation) 06/06/2018   CAD (coronary artery disease) 05/13/2018   Chronic diastolic CHF (congestive heart failure) 11/17/2018   Coronary artery disease    Diabetes mellitus type II, non insulin dependent 11/17/2018   Hypothyroidism 11/17/2018    Past Surgical History:  Procedure Laterality Date   CORONARY ARTERY BYPASS GRAFT     LEFT HEART CATH AND CORS/GRAFTS ANGIOGRAPHY N/A 05/17/2018   Procedure: LEFT HEART CATH AND CORS/GRAFTS ANGIOGRAPHY and possible PCI and stent;  Surgeon: Yolonda Kida, MD;  Location: [facility];  Service: Cardiovascular;  Laterality: N/A;     Home Medications:  Prior to Admission medications   Medication Sig Start Date End Date Taking? Authorizing Provider  atorvastatin (LIPITOR) 80 MG tablet Take 80 mg by mouth every evening.   Yes [provider]  canagliflozin (INVOKANA) 300 MG TABS tablet Take 300 mg by mouth in the morning.   Yes [provider]  chlorthalidone (HYGROTON) 25 MG tablet Take 25 mg by mouth in the morning.   Yes [provider]  furosemide (LASIX) 40 MG tablet Take 1 tablet (40 mg total) by mouth daily. 03/09/22  Yes Sreenath, Sudheer B, MD  gabapentin (NEURONTIN) 300 MG capsule Take 300 mg by mouth 2 (two) times daily.   Yes [provider]  hydrALAZINE (APRESOLINE) 50 MG tablet Take  100 mg by mouth 2 (two) times daily.   Yes [provider]  levothyroxine (SYNTHROID) 300 MCG tablet Take 300 mcg by mouth daily before breakfast. 05/10/22 05/10/23 Yes [provider]  sucralfate (CARAFATE) 1 GM/10ML suspension Take 1 g by mouth 4 (four) times daily.   Yes [provider]  acetaminophen (TYLENOL) 500 MG tablet Take 500-1,000 mg by mouth every 6 (six) hours as needed for mild pain or fever.    [provider]  amiodarone  (PACERONE) 200 MG tablet One tab po twice a day for one week then one tablet daily afterwards Patient not taking: Reported on 08/20/2022 04/08/20   Loletha Grayer, MD  levothyroxine (SYNTHROID) 200 MCG tablet Take 1 tablet (200 mcg total) by mouth daily before breakfast. 03/10/22 04/09/22  Sidney Ace, MD  losartan (COZAAR) 100 MG tablet Take 100 mg by mouth daily.    [provider]  metFORMIN (GLUCOPHAGE) 1000 MG tablet Take 1,000 mg by mouth 2 (two) times daily with a meal.    [provider]  metoprolol tartrate (LOPRESSOR) 50 MG tablet Take 1 tablet (50 mg total) by mouth 2 (two) times daily. 11/21/18   Elgergawy, Silver Huguenin, MD  pantoprazole (PROTONIX) 20 MG tablet Take 1 tablet (20 mg total) by mouth 2 (two) times daily. 03/09/22 04/08/22  Sidney Ace, MD  rivaroxaban (XARELTO) 20 MG TABS tablet Take 1 tablet (20 mg total) by mouth daily. Patient taking differently: Take 20 mg by mouth daily with supper. 04/08/20   Loletha Grayer, MD  spironolactone (ALDACTONE) 25 MG tablet Take 1 tablet (25 mg total) by mouth daily. 03/10/22 04/09/22  Sidney Ace, MD    Inpatient Medications: Scheduled Meds:  atorvastatin  80 mg Oral QPM   gabapentin  300 mg Oral BID   insulin aspart  0-15 Units Subcutaneous TID WC   insulin aspart  0-5 Units Subcutaneous QHS   levothyroxine  300 mcg Oral QAC breakfast   losartan  100 mg Oral Daily   metoprolol tartrate  50 mg Oral BID   pantoprazole  20 mg Oral BID   rivaroxaban  20 mg Oral Q supper   spironolactone  25 mg Oral Daily   Continuous Infusions:  PRN Meds: acetaminophen, butalbital-acetaminophen-caffeine, nitroGLYCERIN, ondansetron (ZOFRAN) IV  Allergies:   No Known Allergies  Social History:   Social History   Socioeconomic History   Marital status: Single    Spouse name: Not on file   Number of children: Not on file   Years of education: Not on file   Highest education level: Not on file   Occupational History   Not on file  Tobacco Use   Smoking status: Never   Smokeless tobacco: Never  Vaping Use   Vaping Use: Never used  Substance and Sexual Activity   Alcohol use: Not Currently   Drug use: Never   Sexual activity: Not Currently  Other Topics Concern   Not on file  Social History Narrative   Not on file   Social Determinants of Health   Financial Resource Strain: Medium Risk (05/13/2018)   Overall Financial Resource Strain (CARDIA)    Difficulty of Paying Living Expenses: Somewhat hard  Food Insecurity: Unknown (05/13/2018)   Hunger Vital Sign    Worried About Running Out of Food in the Last Year: Patient declined    [place] in the Last Year: Patient declined  Transportation Needs: Unknown (05/13/2018)   [place] - Transportation  Lack of Transportation (Medical): Patient declined    Lack of Transportation (Non-Medical): Patient declined  Physical Activity: Unknown (05/13/2018)   Exercise Vital Sign    Days of Exercise per Week: Patient declined    Minutes of Exercise per Session: Patient declined  Stress: Stress Concern Present (05/13/2018)   [place]    Feeling of Stress : To some extent  Social Connections: Not on file  Intimate Partner Violence: Not on file    Family History:    Family History  Problem Relation Age of Onset   Heart disease Brother      ROS:  Please see the history of present illness.   All other ROS reviewed and negative.     Physical Exam/Data:   Vitals:   08/21/22 0300 08/21/22 0541 08/21/22 0644 08/21/22 0810  BP:  106/60  116/66  Pulse: 74 68  74  Resp:  16    Temp:   98.4 F (36.9 C)   TempSrc:   Oral   SpO2: 92% 99%    Weight:      Height:        Intake/Output Summary (Last 24 hours) at 08/21/2022 0917 Last data filed at 08/21/2022 [id number] Gross per 24 hour  Intake --  Output 800 ml  Net -800 ml      08/20/2022    1:17 PM  03/06/2022    5:34 PM 04/07/2020    5:46 PM  Last 3 Weights  Weight (lbs) 264 lb 286 lb 9.6 oz 304 lb 9.6 oz  Weight (kg) 119.75 kg 130 kg 138.166 kg     Body mass index is 37.88 kg/m.  General:  Well nourished, well developed, in no acute distress HEENT: normal Neck: no JVD Vascular: No carotid bruits; Distal pulses 2+ bilaterally Cardiac:  normal S1, S2; RRR; + murmur  Lungs:  clear to auscultation bilaterally, no wheezing, rhonchi or rales  Abd: soft, nontender, no hepatomegaly  Ext: no edema Musculoskeletal:  No deformities, BUE and BLE strength normal and equal Skin: warm and dry  Neuro:  CNs 2-12 intact, no focal abnormalities noted Psych:  Normal affect   EKG:  The EKG was personally reviewed and demonstrates:  NSR 1st degree AV block, 70bpm, RBBB nonspecific ST changes Telemetry:  Telemetry was personally reviewed and demonstrates:  SR/SB, HR 50-60s  Relevant CV Studies:  LHC 07/31/22 Findings:  Coronary artery disease including 80% proximal LCx stenosis, 40-50% LAD  stenosis and 50-60% proximal RCA stenosis.  SVG-OM graft has 50% proximal stenosis   Impression/Recommendations:  1. His CAD is relatively unchanged from his cardiac cath in 2022.  He has  a proximal SVG disease and LAD disease that is moderate, and unchanged  from his previous cath.   2. His symptoms of chest discomfort and dyspnea may be related to OSA.   Consider sleep study.  3. Recommend medical management of moderate disease at this time.   Complications:  None   Echo 06/2022 Summary   1. The left ventricle is normal in size with moderately increased wall  thickness.   2. The left ventricular systolic function is normal, LVEF is visually  estimated at > 55%.    3. Aortic valve replacement (26 mm bioprosthetic, implantation date:  10/12/2013).   4. Aortic valve Doppler indices are consistent with normal prosthetic valve  function.   5. The left atrium is mildly dilated in size.    6. The  right ventricle  is normal in size, with normal systolic function.   LHC 08/2020 Findings:  1. Diffuse moderate CAD without focal high grade stenosis.  2. Patent SVG to OM with 40-50% proximal stenosis.   Recommendations:  1. Aggressive secondary prevention and medical therapy.   2. Follow up with primary cardiologist.   Complications:   None   Echo 12/2013 Left Ventricle      The left  ventricle is normal  in size with  severely increased  wall      thickness  and mildly decreased  contraction.      The visually  estimated  left ventricular  ejection fraction  is 45-50%.      This is  significantly increased  compared  to prior echo study  of      10/17/13.      Diastology  was not evaluated  in this study.  Mitral Valve      The mitral  valve is mildly  thickened with  normal leaflet  mobility.      There  is trivial mitral  regurgitation by  color flow and  continuous      wave Doppler  examinations.  Left Atrium      The left  atrium is mildly  dilated.  Aortic Valve      The bioprosthetic  aortic  valve is well-seated.      There  is trivial aortic  regurgitation by  color flow and  continuous      wave Doppler  examinations.  Aorta     The ascending  thoracic  aorta is normal in  diameter at the  level of the      left ventricular  outflow  tract and sinuses  of Valsalva;  the aorta is      suboptimally  imaged above  the sinotubular  junction and at  the      transverse  arch.  Right Ventricle      The right  ventricle is  normal in size with  normal wall thickness  and      normal  contraction.  Right Atrium      The right  atrium is mildly  dilated.  Inferior Vena Cava      Suboptimally  imaged inferior  vena cava is  probably normal  in size with      physiologic  phasic respiratory  variation.  Pericardium     There  is a relatively echo-free  space consistent  with trivial      posterior  pericardial effusion  and/or subepicardial  fat.   Echo  10/2013 Technically  difficult study  due to chest  wall and/or lung  interference      Aortic  root replacement      Aortic  valve replacement  (26 mm freestyle  bioprosthetic valve  and      root,  10/12/13)      Coronary  artery bypass  graft surgery      Left ventricular  hypertrophy  (severe)      Contractile  left ventricular  dysfunction  (severe)      Decreased  left ventricular  ejection fraction  (25-30%)      Degenerative  mitral valve  disease      Dilated  left atrium      Biosprosthetic  aortic valve      Dilated  right ventricle      Contractile  right ventricular  dysfunction  (  severe)      Pericardial  effusion (see  detail below)   Laboratory Data:  High Sensitivity Troponin:   Recent Labs  Lab 08/20/22 1319 08/20/22 1530  TROPONINIHS 50* 54*     Chemistry Recent Labs  Lab 08/20/22 1319 08/21/22 0527  NA 137  --   K 2.9*  --   CL 100  --   CO2 25  --   GLUCOSE 222*  --   BUN 44*  --   CREATININE 1.56*  --   CALCIUM 9.5  --   MG 2.2 2.1  GFRNONAA 51*  --   ANIONGAP 12  --     Recent Labs  Lab 08/20/22 1319  PROT 8.4*  ALBUMIN 4.0  AST 32  ALT 28  ALKPHOS 74  BILITOT 0.6   Lipids No results for input(s): "CHOL", "TRIG", "HDL", "LABVLDL", "LDLCALC", "CHOLHDL" in the last 168 hours.  Hematology Recent Labs  Lab 08/20/22 1319  WBC 8.7  RBC 4.25  HGB 10.8*  HCT 34.0*  MCV 80.0  MCH 25.4*  MCHC 31.8  RDW 14.7  PLT 352   Thyroid No results for input(s): "TSH", "FREET4" in the last 168 hours.  BNPNo results for input(s): "BNP", "PROBNP" in the last 168 hours.  DDimer No results for input(s): "DDIMER" in the last 168 hours.   Radiology/Studies:  DG Chest Port 1 View  Result Date: 08/20/2022 CLINICAL DATA:  Chest pain, hypotension, weakness. Atrial fibrillation. EXAM: PORTABLE CHEST 1 VIEW COMPARISON:  03/08/2022 and CT chest 04/07/2020. FINDINGS: Trachea is midline. Heart is enlarged, stable. Thoracic aorta is calcified. Lungs are clear.  No pleural. IMPRESSION: No acute findings. Electronically Signed   By: Lorin Picket M.D.   On: 08/20/2022 13:43     Assessment and Plan:   Unstable angina Elevated troponin CAD s/p CABG with prior stenting and chronic UA - patient has a long history of UA followed at [facility] - recent heart cath for chest pain and DOE 07/2022 showed stable CAD, recommending medical management. Echo showed normal LVEF - presented with chest pain and EMS reported afib RVR. He was hypotensive with hypokalemia on presentation - HS trop 50>54 - EKG without changes - no IV heparin - echo ordered - no ASA given Xarelto - continue PTA lopressor and Lipitor - unsure if patient was on Imdur in the past - Given long history of CAD and chronic UA with recent stable heart cath, will not likely pursue further ischemic testing. Will discuss with MD. Can consider addition of Imdur vs Ranexa. Will continue to follow with [facility].  HFimpEF ICM - EF as low as 35-30% in the past with improvement.  - Most recent echo showed LVEF >55% - repeat echo as above - appears euvolemic on exam - PTA lasix 40mg  daily, chlorthalidone 25mg  daily, Invokana 300mg  daily, Losartan 100mg  daily, Lopressor 50mg  BID, spiro 25mg  daily  HTN - BP low on presentation, now improved - PTA chlorthalidone 25mg  daily, Hydralazine 100mg  BID, Losartan 100mg  daily, Lopressor 50mg  BID, spiro 25mg  daily  HLD - LDL 115 in 2021 - repeat lipid panel - continue PTA Lipitor 80mg  daily  Paroxysmal Afib - reportedly found to be in Afib by EMS,unable to fund EKG tracings - SB/SR since admission - K 2.9, given oral K, follow BMET - Mag 2.2 - continue PTA Xarelto - amiodarone held for prolonged Qt, but seems it has been long in the past. Recent cardiology notes suggest stopping amio given young age - 46  need heart monitor to determine afib burden - may benefit from EP if patient is going in and out of afib to discuss other AA  H/o AVR - normally  functioning valve on recent echo  OSA/DOE - recent recommendation for OSA work-up, prior sleep study with severe OSA - pulmonology consulted  For questions or updates, please contact [place] Please consult [email or web address] for contact info under    Signed, Harlen Danford Ninfa Meeker, PA-C  08/21/2022 9:17 AM

## 2022-08-21 NOTE — Progress Notes (Signed)
PROGRESS NOTE    Craig Reese  [id number] DOB: 1960-11-01 DOA: 08/20/2022 PCP: System, Provider Not In    Brief Narrative:  62 year old male with history of CAD status post CABG to SVG and OM1 in 2015, prior DES to pLAD, history of thyroid carcinoma status post lateral neck dissection, hyperlipidemia, hypertension, heart failure preserved ejection fraction, severe aortic valve stenosis status post open AVR in 2015, paroxysmal atrial fibrillation on rivaroxaban amiodarone and metoprolol, hypertension, non-insulin-dependent diabetes mellitus, obstructive sleep apnea, who presents to the emergency department for chief concerns of chest pain, shortness of breath.    Assessment & Plan:   Principal Problem:   Chest pain Active Problems:   CAD (coronary artery disease)   Atrial fibrillation with RVR   Type 2 diabetes mellitus with hyperlipidemia   Hypothyroidism   Hypokalemia   Chronic diastolic CHF (congestive heart failure)   Prolonged QT interval   HLD (hyperlipidemia)   Elevated troponin   HTN (hypertension)   AF (paroxysmal atrial fibrillation)   CKD stage 3a, GFR 45-59 ml/min   History of aortic valve stenosis   Dyspnea   Obesity (BMI 30-39.9)   Severe obstructive sleep apnea   Hypotension   Chest pain Patient has had multiple hospital evaluation and admissions concerning chest pain and shortness of breath, differentials include untreated sleep apnea, anginal pain, medication noncompliance in setting of decreased understanding complicated by [place] language deficiency Of note on left heart cath done at [facility] on 07/31/2022, recommended medical management and possible etiology of his chronic chest pain and shortness of breath is related to obstructive sleep apnea Plan: Admit inpatient Continuous telemetry monitoring 2D echocardiogram Cardiology to consider initiation of antianginal therapy Likely no plans for inpatient ischemic evaluation  Hypotension With dry  mucosa clinically Status post 2.5 L Blood pressure improved No IV fluids   Severe obstructive sleep apnea Patient had polysomnography with CPAP test on 09/27/2020, patient exhibited severe obstructive sleep apnea with AHI of 50.6 and oxygen saturation nadir of 80%.  Snoring was mild.  Patient exhibited poor sleep consolidation, periodic limb movements of sleep  Plan: Case discussed with pulmonology.  No inpatient evaluation necessary at this time.  CPAP nightly while admitted.  Outpatient follow-up with PCP for establishment of home NIPPV  Obesity (BMI 30-39.9) This meets criteria for morbid obesity based on the presence of 1 or more chronic comorbidities. Patient has 37.88, with diabetes mellitus and hypertension and CAD. This complicates overall care and prognosis.    Dyspnea Query untreated obstructive sleep apnea CPAP nightly will be ordered   History of aortic valve stenosis Status post bioprosthetic aortic valve replacement in 2015 Xarelto for anticoagulation   CKD stage 3a, GFR 45-59 ml/min At baseline   AF (paroxysmal atrial fibrillation) Resumed home Xarelto 20 mg daily with supper, metoprolol tartrate 50 mg p.o. twice daily Amiodarone not resumed on admission due to prolonged QT on admission AM team to reinitiate when the benefits outweigh the risk   HTN (hypertension) Losartan 100 mg daily, metoprolol tartrate 50 mg p.o. twice daily, spironolactone 25 mg p.o. twice daily resumed for 08/21/2022 as patient has low normotensive on admission and has dry mucosal    Elevated troponin Initial high sensitive troponin was elevated at 50, second high sensitive troponin is pending    HLD (hyperlipidemia) Atorvastatin 80 mg nightly resumed   Prolonged QT interval Home amiodarone not resumed on admission due to prolonged QT   Chronic diastolic CHF (congestive heart failure) Appears compensated, and does  not appear to be in acute exacerbation at this time   Hypokalemia Status  post potassium chloride 40 mill equivalent p.o. one-time dose per EDP Monitor and replace as appropriate   Hypothyroidism Home levothyroxine 300 mcg daily before breakfast resumed   Type 2 diabetes mellitus with hyperlipidemia Insulin SSI with at bedtime coverage ordered Goal inpatient blood glucose levels 140-180   DVT prophylaxis: Xarelto Code Status: Full Family Communication: None Disposition Plan: Status is: Inpatient Remains inpatient appropriate because: High risk chest pain.  Cardiology evaluation in progress.   Level of care: Telemetry Cardiac  Consultants:  Cardiology  Procedures:  None  Antimicrobials: None    Subjective: seen and examined.  History conducted in [place].  Endorses persistent chest pain though improved from prior.  Objective: Vitals:   08/21/22 0644 08/21/22 0810 08/21/22 1155 08/21/22 1330  BP:  116/66 124/72 (!) 137/58  Pulse:  74 64 61  Resp:   14 16  Temp: 98.4 F (36.9 C)  98.2 F (36.8 C)   TempSrc: Oral  Oral   SpO2:   100% 98%  Weight:      Height:        Intake/Output Summary (Last 24 hours) at 08/21/2022 1430 Last data filed at 08/21/2022 [id number] Gross per 24 hour  Intake --  Output 800 ml  Net -800 ml   Filed Weights   08/20/22 1317  Weight: 119.7 kg    Examination:  General exam: Appears calm and comfortable  Respiratory system: Decreased air entry bilaterally.  Normal work of breathing.  Room air Cardiovascular system: S1-S2, RRR, 4/6 murmur Gastrointestinal system: Obese, soft, NT/ND, normal bowel sounds Central nervous system: Alert and oriented. No focal neurological deficits. Extremities: Symmetric 5 x 5 power. Skin: No rashes, lesions or ulcers Psychiatry: Judgement and insight appear normal. Mood & affect appropriate.     Data Reviewed: I have personally reviewed following labs and imaging studies  CBC: Recent Labs  Lab 08/20/22 1319  WBC 8.7  HGB 10.8*  HCT 34.0*  MCV 80.0  PLT [id number]   Basic  Metabolic Panel: Recent Labs  Lab 08/20/22 1319 08/21/22 0527  NA 137  --   K 2.9*  --   CL 100  --   CO2 25  --   GLUCOSE 222*  --   BUN 44*  --   CREATININE 1.56*  --   CALCIUM 9.5  --   MG 2.2 2.1   GFR: Estimated Creatinine Clearance: 66.1 mL/min (A) (by C-G formula based on SCr of 1.56 mg/dL (H)). Liver Function Tests: Recent Labs  Lab 08/20/22 1319  AST 32  ALT 28  ALKPHOS 74  BILITOT 0.6  PROT 8.4*  ALBUMIN 4.0   No results for input(s): "LIPASE", "AMYLASE" in the last 168 hours. No results for input(s): "AMMONIA" in the last 168 hours. Coagulation Profile: No results for input(s): "INR", "PROTIME" in the last 168 hours. Cardiac Enzymes: No results for input(s): "CKTOTAL", "CKMB", "CKMBINDEX", "TROPONINI" in the last 168 hours. BNP (last 3 results) No results for input(s): "PROBNP" in the last 8760 hours. HbA1C: No results for input(s): "HGBA1C" in the last 72 hours. CBG: Recent Labs  Lab 08/20/22 1653 08/20/22 2209 08/21/22 0736 08/21/22 1155  GLUCAP 128* 143* 158* 180*   Lipid Profile: No results for input(s): "CHOL", "HDL", "LDLCALC", "TRIG", "CHOLHDL", "LDLDIRECT" in the last 72 hours. Thyroid Function Tests: Recent Labs    08/21/22 0532  TSH 1.492   Anemia Panel: No results for input(s): "  VITAMINB12", "FOLATE", "FERRITIN", "TIBC", "IRON", "RETICCTPCT" in the last 72 hours. Sepsis Labs: No results for input(s): "PROCALCITON", "LATICACIDVEN" in the last 168 hours.  No results found for this or any previous visit (from the past 240 hour(s)).       Radiology Studies: DG Chest Port 1 View  Result Date: 08/20/2022 CLINICAL DATA:  Chest pain, hypotension, weakness. Atrial fibrillation. EXAM: PORTABLE CHEST 1 VIEW COMPARISON:  03/08/2022 and CT chest 04/07/2020. FINDINGS: Trachea is midline. Heart is enlarged, stable. Thoracic aorta is calcified. Lungs are clear. No pleural. IMPRESSION: No acute findings. Electronically Signed   By: Lorin Picket M.D.   On: 08/20/2022 13:43        Scheduled Meds:  atorvastatin  80 mg Oral QPM   gabapentin  300 mg Oral BID   insulin aspart  0-15 Units Subcutaneous TID WC   insulin aspart  0-5 Units Subcutaneous QHS   levothyroxine  300 mcg Oral QAC breakfast   losartan  100 mg Oral Daily   metoprolol tartrate  50 mg Oral BID   pantoprazole  20 mg Oral BID   rivaroxaban  20 mg Oral Q supper   spironolactone  25 mg Oral Daily   Continuous Infusions:   LOS: 0 days    Sidney Ace, MD [facility]   If 7PM-7AM, please contact night-coverage  08/21/2022, 2:30 PM

## 2022-08-21 NOTE — ED Notes (Signed)
Report received. Pt noted to be resting quietly in bed with eyes closed. Respirations even and unlabored. Bed in lowest position and locked with bed rails up X 2. No distress noted.

## 2022-08-21 NOTE — ED Notes (Signed)
Dr. Priscella Mann at bedside assessing pt. Verbalized understanding about murmur heard on assessment this AM.

## 2022-08-21 NOTE — ED Notes (Signed)
Request made for transport to the floor ?

## 2022-08-21 NOTE — Progress Notes (Signed)
TOC consulted by MD to assist with medicaid application as patient is uninsured, CSW has made referral to financial counselor to follow up in assisting patient with this process.   Kelby Fam, [place], MSW, [place]

## 2022-08-21 NOTE — Progress Notes (Signed)
*  PRELIMINARY RESULTS* Echocardiogram 2D Echocardiogram has been performed.  Craig Reese 08/21/2022, 2:19 PM

## 2022-08-21 NOTE — Progress Notes (Signed)
Attempted cpap with this patient.  Pressure at 10 per protocol. Ramp of 4 used for comfort. Translation made via family member. Patient states cannot tolerate pressure from machine. States feels as if he could not breath. Patient refused. Mask and circuit left in case wants to attempt again.

## 2022-08-22 DIAGNOSIS — R0789 Other chest pain: Secondary | ICD-10-CM

## 2022-08-22 LAB — GLUCOSE, CAPILLARY
Glucose-Capillary: 147 mg/dL — ABNORMAL HIGH (ref 70–99)
Glucose-Capillary: 192 mg/dL — ABNORMAL HIGH (ref 70–99)
Glucose-Capillary: 191 mg/dL — ABNORMAL HIGH (ref 70–99)

## 2022-08-22 LAB — LIPID PANEL
Cholesterol: 114 mg/dL (ref 0–200)
HDL: 24 mg/dL — ABNORMAL LOW (ref >40)
LDL Cholesterol: 54 mg/dL (ref 0–99)
Total CHOL/HDL Ratio: 4.8 RATIO
Triglycerides: 180 mg/dL — ABNORMAL HIGH (ref <150)
VLDL: 36 mg/dL (ref 0–40)

## 2022-08-22 NOTE — Discharge Summary (Signed)
Physician Discharge Summary  Dawyne Doten CXK:[id number] DOB: 1960-09-20 DOA: 08/20/2022  PCP: System, Provider Not In  Admit date: 08/20/2022 Discharge date: 08/22/2022  Admitted From: Home Disposition:  Home  Recommendations for Outpatient Follow-up:  Follow up with PCP in 1-2 weeks Follow-up with [facility] cardiology  Home Health: No Equipment/Devices: None  Discharge Condition: Stable CODE STATUS: Full Diet recommendation: Heart healthy  Brief/Interim Summary: 62 year old male with history of CAD status post CABG to SVG and OM1 in 2015, prior DES to pLAD, history of thyroid carcinoma status post lateral neck dissection, hyperlipidemia, hypertension, heart failure preserved ejection fraction, severe aortic valve stenosis status post open AVR in 2015, paroxysmal atrial fibrillation on rivaroxaban amiodarone and metoprolol, hypertension, non-insulin-dependent diabetes mellitus, obstructive sleep apnea, who presents to the emergency department for chief concerns of chest pain, shortness of breath.   Troponins flat.  Echocardiogram reassuring.  Cardiology evaluated.  No indication or room to add antianginal therapy at this point.  Patient has refractory hypertension on multiple agents and concerned about dropping blood pressure too low.  No ischemic evaluation recommended by cardiology.  Recommendation for patient to follow-up with [facility] cardiology postdischarge.  I had a lengthy discussion with the patient on the day of discharge.  I explained that he has untreated severe sleep apnea and this is likely precipitating a lot of his hospital evaluations and readmissions.  Strongly recommend the patient go back to his primary care physician who is in the process of setting up home CPAP.  Interview completed with assistance of video interpreter.  Patient expressed understanding.  All questions answered.    Discharge Diagnoses:  Principal Problem:   Chest pain Active Problems:   CAD (coronary artery  disease)   Atrial fibrillation with RVR   Type 2 diabetes mellitus with hyperlipidemia   Hypothyroidism   Hypokalemia   Chronic diastolic CHF (congestive heart failure)   Prolonged QT interval   HLD (hyperlipidemia)   Elevated troponin   HTN (hypertension)   AF (paroxysmal atrial fibrillation)   CKD stage 3a, GFR 45-59 ml/min   History of aortic valve stenosis   Dyspnea   Obesity (BMI 30-39.9)   Severe obstructive sleep apnea   Hypotension  Chest pain Patient has had multiple hospital evaluation and admissions concerning chest pain and shortness of breath, differentials include untreated sleep apnea, anginal pain, medication noncompliance in setting of decreased understanding complicated by [place] language deficiency Of note on left heart cath done at [facility] on 07/31/2022, recommended medical management and possible etiology of his chronic chest pain and shortness of breath is related to obstructive sleep apnea Plan: Discharged home.  No changes to home medication regimen.  Strongly recommend going back to primary care physician to discuss outpatient sleep study and initiation of home NIPPV.  Discharge Instructions  Discharge Instructions     Diet - low sodium heart healthy   Complete by: As directed    Increase activity slowly   Complete by: As directed       Allergies as of 08/22/2022   No Known Allergies      Medication List     STOP taking these medications    amiodarone 200 MG tablet Commonly known as: PACERONE       TAKE these medications    acetaminophen 500 MG tablet Commonly known as: TYLENOL Take 500-1,000 mg by mouth every 6 (six) hours as needed for mild pain or fever.   atorvastatin 80 MG tablet Commonly known as: LIPITOR Take 80  mg by mouth every evening.   canagliflozin 300 MG Tabs tablet Commonly known as: INVOKANA Take 300 mg by mouth in the morning.   chlorthalidone 25 MG tablet Commonly known as: HYGROTON Take 25 mg by mouth  in the morning.   furosemide 40 MG tablet Commonly known as: LASIX Take 1 tablet (40 mg total) by mouth daily.   gabapentin 300 MG capsule Commonly known as: NEURONTIN Take 300 mg by mouth 2 (two) times daily.   hydrALAZINE 50 MG tablet Commonly known as: APRESOLINE Take 100 mg by mouth 2 (two) times daily.   levothyroxine 200 MCG tablet Commonly known as: SYNTHROID Take 1 tablet (200 mcg total) by mouth daily before breakfast.   levothyroxine 300 MCG tablet Commonly known as: SYNTHROID Take 300 mcg by mouth daily before breakfast.   losartan 100 MG tablet Commonly known as: COZAAR Take 100 mg by mouth daily.   metFORMIN 1000 MG tablet Commonly known as: GLUCOPHAGE Take 1,000 mg by mouth 2 (two) times daily with a meal.   metoprolol tartrate 50 MG tablet Commonly known as: LOPRESSOR Take 1 tablet (50 mg total) by mouth 2 (two) times daily.   pantoprazole 20 MG tablet Commonly known as: PROTONIX Take 1 tablet (20 mg total) by mouth 2 (two) times daily.   rivaroxaban 20 MG Tabs tablet Commonly known as: XARELTO Take 1 tablet (20 mg total) by mouth daily. What changed: when to take this   spironolactone 25 MG tablet Commonly known as: ALDACTONE Take 1 tablet (25 mg total) by mouth daily.   sucralfate 1 GM/10ML suspension Commonly known as: CARAFATE Take 1 g by mouth 4 (four) times daily.        Follow-up Information     Katrina StackBermudez, Ana Laura, MD. Schedule an appointment as soon as possible for a visit in 1 week(s).   Specialty: Internal Medicine Contact information: [address] [facility] [place] [address] [phone]                No Known Allergies  Consultations: Cardiology   Procedures/Studies: ECHOCARDIOGRAM COMPLETE  Result Date: 08/21/2022    ECHOCARDIOGRAM REPORT   Patient Name:   Kerin PernaJOSE VASQUEZ Date of Exam: 08/21/2022 Medical Rec #:  [id number]    Height:       70.0 in Accession #:     [id number]   Weight:       264.0 lb Date of Birth:  April 05, 1961    BSA:          2.349 m Patient Age:    61 years     BP:           124/72 mmHg Patient Gender: M            HR:           64 bpm. Exam Location:  [facility] Procedure: 2D Echo, Cardiac Doppler, Color Doppler and Intracardiac            Opacification Agent Indications:     Chest Pain  History:         Patient has prior history of Echocardiogram examinations, most                  recent 03/07/2022. CHF, CAD and Previous Myocardial Infarction,                  Arrythmias:Atrial Fibrillation, Signs/Symptoms:Chest Pain and  Dyspnea; Risk Factors:Hypertension, Diabetes, Dyslipidemia and                  Sleep Apnea.  Sonographer:     Mikki Harbor Referring Phys:  [id number] CADENCE H FURTH Diagnosing Phys: Lorine Bears MD  Sonographer Comments: Technically difficult study due to poor echo windows, no subcostal window and patient is obese. Image acquisition challenging due to patient body habitus. IMPRESSIONS  1. Left ventricular ejection fraction, by estimation, is 55 to 60%. The left ventricle has normal function. The left ventricle has no regional wall motion abnormalities. There is severe left ventricular hypertrophy. Left ventricular diastolic parameters  are consistent with Grade II diastolic dysfunction (pseudonormalization).  2. Right ventricular systolic function is mildly reduced. The right ventricular size is normal. There is normal pulmonary artery systolic pressure.  3. Left atrial size was severely dilated.  4. Right atrial size was moderately dilated.  5. The mitral valve is normal in structure. Mild mitral valve regurgitation. No evidence of mitral stenosis.  6. The aortic valve has been repaired/replaced. Aortic valve regurgitation is not visualized. Moderate aortic valve stenosis. Aortic valve area, by VTI measures 1.24 cm. Aortic valve mean gradient measures 29.5 mmHg.  7. Aortic dilatation noted. There is mild dilatation of  the ascending aorta, measuring 40 mm.  8. The inferior vena cava is normal in size with <50% respiratory variability, suggesting right atrial pressure of 8 mmHg. FINDINGS  Left Ventricle: Left ventricular ejection fraction, by estimation, is 55 to 60%. The left ventricle has normal function. The left ventricle has no regional wall motion abnormalities. Definity contrast agent was given IV to delineate the left ventricular  endocardial borders. The left ventricular internal cavity size was normal in size. There is severe left ventricular hypertrophy. Left ventricular diastolic parameters are consistent with Grade II diastolic dysfunction (pseudonormalization). Right Ventricle: The right ventricular size is normal. No increase in right ventricular wall thickness. Right ventricular systolic function is mildly reduced. There is normal pulmonary artery systolic pressure. The tricuspid regurgitant velocity is 1.88 m/s, and with an assumed right atrial pressure of 8 mmHg, the estimated right ventricular systolic pressure is 22.1 mmHg. Left Atrium: Left atrial size was severely dilated. Right Atrium: Right atrial size was moderately dilated. Pericardium: There is no evidence of pericardial effusion. Mitral Valve: The mitral valve is normal in structure. There is mild thickening of the mitral valve leaflet(s). Mild mitral valve regurgitation. No evidence of mitral valve stenosis. MV peak gradient, 5.9 mmHg. The mean mitral valve gradient is 2.0 mmHg. Tricuspid Valve: The tricuspid valve is normal in structure. Tricuspid valve regurgitation is trivial. No evidence of tricuspid stenosis. Aortic Valve: The aortic valve has been repaired/replaced. Aortic valve regurgitation is not visualized. Moderate aortic stenosis is present. Aortic valve mean gradient measures 29.5 mmHg. Aortic valve peak gradient measures 48.7 mmHg. Aortic valve area,  by VTI measures 1.24 cm. There is a bioprosthetic valve present in the aortic position.  Pulmonic Valve: The pulmonic valve was normal in structure. Pulmonic valve regurgitation is mild. No evidence of pulmonic stenosis. Aorta: Aortic dilatation noted. There is mild dilatation of the ascending aorta, measuring 40 mm. Venous: The inferior vena cava is normal in size with less than 50% respiratory variability, suggesting right atrial pressure of 8 mmHg. IAS/Shunts: No atrial level shunt detected by color flow Doppler.  LEFT VENTRICLE PLAX 2D LVIDd:         4.20 cm   Diastology LVIDs:  2.80 cm   LV e' medial:    4.78 cm/s LV PW:         2.00 cm   LV E/e' medial:  20.9 LV IVS:        2.00 cm   LV e' lateral:   7.15 cm/s LVOT diam:     2.00 cm   LV E/e' lateral: 14.0 LV SV:         100 LV SV Index:   43 LVOT Area:     3.14 cm  RIGHT VENTRICLE RV Basal diam:  5.30 cm LEFT ATRIUM              Index        RIGHT ATRIUM           Index LA diam:        6.10 cm  2.60 cm/m   RA Area:     25.90 cm LA Vol (A2C):   103.0 ml 43.85 ml/m  RA Volume:   88.40 ml  37.64 ml/m LA Vol (A4C):   119.0 ml 50.67 ml/m LA Biplane Vol: 117.0 ml 49.81 ml/m  AORTIC VALVE                     PULMONIC VALVE AV Area (Vmax):    1.14 cm      PV Vmax:       1.20 m/s AV Area (Vmean):   1.05 cm      PV Peak grad:  5.8 mmHg AV Area (VTI):     1.24 cm AV Vmax:           349.00 cm/s AV Vmean:          251.500 cm/s AV VTI:            0.805 m AV Peak Grad:      48.7 mmHg AV Mean Grad:      29.5 mmHg LVOT Vmax:         127.00 cm/s LVOT Vmean:        84.300 cm/s LVOT VTI:          0.318 m LVOT/AV VTI ratio: 0.40  AORTA Ao Root diam: 3.50 cm Ao Asc diam:  4.00 cm MITRAL VALVE                TRICUSPID VALVE MV Area (PHT): 2.73 cm     TR Peak grad:   14.1 mmHg MV Area VTI:   2.65 cm     TR Vmax:        188.00 cm/s MV Peak grad:  5.9 mmHg MV Mean grad:  2.0 mmHg     SHUNTS MV Vmax:       1.21 m/s     Systemic VTI:  0.32 m MV Vmean:      68.9 cm/s    Systemic Diam: 2.00 cm MV Decel Time: 278 msec MV E velocity: 100.00 cm/s MV A  velocity: 98.50 cm/s MV E/A ratio:  1.02 Lorine BearsMuhammad Arida MD Electronically signed by Lorine BearsMuhammad Arida MD Signature Date/Time: 08/21/2022/3:55:45 PM    Final    DG Chest Port 1 View  Result Date: 08/20/2022 CLINICAL DATA:  Chest pain, hypotension, weakness. Atrial fibrillation. EXAM: PORTABLE CHEST 1 VIEW COMPARISON:  03/08/2022 and CT chest 04/07/2020. FINDINGS: Trachea is midline. Heart is enlarged, stable. Thoracic aorta is calcified. Lungs are clear. No pleural. IMPRESSION: No acute findings. Electronically Signed   By: Leanna BattlesMelinda  Blietz M.D.   On: 08/20/2022 13:43  Subjective: Seen and examined on the day of discharge.  Stable no distress.  Stable for discharge home.  Discharge Exam: Vitals:   08/22/22 0915 08/22/22 1201  BP: (!) 156/89 131/71  Pulse: (!) 55 61  Resp: 20 (!) 25  Temp: 97.6 F (36.4 C) 97.8 F (36.6 C)  SpO2: 100% 97%   Vitals:   08/21/22 2323 08/22/22 0632 08/22/22 0915 08/22/22 1201  BP: 135/82 (!) 144/71 (!) 156/89 131/71  Pulse: 65 (!) 58 (!) 55 61  Resp: 18 18 20  (!) 25  Temp: 98.4 F (36.9 C) 97.9 F (36.6 C) 97.6 F (36.4 C) 97.8 F (36.6 C)  TempSrc:  Oral    SpO2: 96% 96% 100% 97%  Weight:      Height:        General: Pt is alert, awake, not in acute distress Cardiovascular: RRR, S1/S2 +, no rubs, no gallops Respiratory: CTA bilaterally, no wheezing, no rhonchi Abdominal: Soft, NT, ND, bowel sounds + Extremities: no edema, no cyanosis    The results of significant diagnostics from this hospitalization (including imaging, microbiology, ancillary and laboratory) are listed below for reference.     Microbiology: No results found for this or any previous visit (from the past 240 hour(s)).   Labs: BNP (last 3 results) Recent Labs    03/08/22 0831  BNP 164.5*   Basic Metabolic Panel: Recent Labs  Lab 08/20/22 1319 08/21/22 0527  NA 137  --   K 2.9*  --   CL 100  --   CO2 25  --   GLUCOSE 222*  --   BUN 44*  --   CREATININE 1.56*   --   CALCIUM 9.5  --   MG 2.2 2.1   Liver Function Tests: Recent Labs  Lab 08/20/22 1319  AST 32  ALT 28  ALKPHOS 74  BILITOT 0.6  PROT 8.4*  ALBUMIN 4.0   No results for input(s): "LIPASE", "AMYLASE" in the last 168 hours. No results for input(s): "AMMONIA" in the last 168 hours. CBC: Recent Labs  Lab 08/20/22 1319  WBC 8.7  HGB 10.8*  HCT 34.0*  MCV 80.0  PLT 352   Cardiac Enzymes: No results for input(s): "CKTOTAL", "CKMB", "CKMBINDEX", "TROPONINI" in the last 168 hours. BNP: Invalid input(s): "POCBNP" CBG: Recent Labs  Lab 08/21/22 1155 08/21/22 1635 08/21/22 2158 08/22/22 0917 08/22/22 1203  GLUCAP 180* 147* 254* 147* 192*   D-Dimer No results for input(s): "DDIMER" in the last 72 hours. Hgb A1c No results for input(s): "HGBA1C" in the last 72 hours. Lipid Profile Recent Labs    08/22/22 0444  CHOL 114  HDL 24*  LDLCALC 54  TRIG 952*  CHOLHDL 4.8   Thyroid function studies Recent Labs    08/21/22 0532  TSH 1.492   Anemia work up No results for input(s): "VITAMINB12", "FOLATE", "FERRITIN", "TIBC", "IRON", "RETICCTPCT" in the last 72 hours. Urinalysis    Component Value Date/Time   COLORURINE Yellow 09/22/2013 2248   APPEARANCEUR Clear 09/22/2013 2248   LABSPEC >1.060 09/22/2013 2248   PHURINE 5.0 09/22/2013 2248   GLUCOSEU Negative 09/22/2013 2248   HGBUR 1+ 09/22/2013 2248   BILIRUBINUR Negative 09/22/2013 2248   KETONESUR Negative 09/22/2013 2248   PROTEINUR Negative 09/22/2013 2248   NITRITE Negative 09/22/2013 2248   LEUKOCYTESUR Negative 09/22/2013 2248   Sepsis Labs Recent Labs  Lab 08/20/22 1319  WBC 8.7   Microbiology No results found for this or any previous visit (from the past  240 hour(s)).   Time coordinating discharge: Over 30 minutes  SIGNED:   Tresa Moore, MD  [facility] 08/22/2022, 1:21 PM Pager   If 7PM-7AM, please contact night-coverage

## 2022-08-22 NOTE — Progress Notes (Addendum)
10:30  Pt to be discharged home.  Attempted to call pt's niece, Genesis, at [phone].  No answer and voicemail is full.  Will continue to attempt to get in contact.  Pt states that she may be at the gym at this time  12:40  Spoke with pt's niece, she will be here in approx 45 min to pick up the patient  1600: Still attempting to reach pt's niece.  Last two attempts, line was busy  1700 Spoke with a different niece, Jola Babinski, who will be here to pick up the pt when she is out of work.  Approx one hour.

## 2022-08-22 NOTE — Progress Notes (Signed)
DC instructions, med list and written info re: CP and afib reviewed with pt and niece Craig Reese.  Tele and IV removed.  Pt dc'd via wheelchair without incident

## 2022-08-24 ENCOUNTER — Other Ambulatory Visit: Payer: Self-pay

## 2022-08-24 ENCOUNTER — Emergency Department: Payer: Medicaid Other

## 2022-08-24 ENCOUNTER — Inpatient Hospital Stay
Admission: EM | Admit: 2022-08-24 | Discharge: 2022-08-26 | DRG: 291 | Disposition: A | Discharge status: Home / Self Care | Payer: Medicaid Other | Attending: Osteopathic Medicine | Admitting: Osteopathic Medicine

## 2022-08-24 DIAGNOSIS — I959 Hypotension, unspecified: Secondary | ICD-10-CM | POA: Diagnosis present

## 2022-08-24 DIAGNOSIS — I4891 Unspecified atrial fibrillation: Secondary | ICD-10-CM | POA: Diagnosis not present

## 2022-08-24 DIAGNOSIS — I700 Atherosclerosis of aorta: Secondary | ICD-10-CM | POA: Diagnosis present

## 2022-08-24 DIAGNOSIS — E785 Hyperlipidemia, unspecified: Secondary | ICD-10-CM | POA: Diagnosis present

## 2022-08-24 DIAGNOSIS — I4819 Other persistent atrial fibrillation: Secondary | ICD-10-CM | POA: Diagnosis present

## 2022-08-24 DIAGNOSIS — Z7901 Long term (current) use of anticoagulants: Secondary | ICD-10-CM

## 2022-08-24 DIAGNOSIS — Z7984 Long term (current) use of oral hypoglycemic drugs: Secondary | ICD-10-CM

## 2022-08-24 DIAGNOSIS — Z951 Presence of aortocoronary bypass graft: Secondary | ICD-10-CM | POA: Diagnosis not present

## 2022-08-24 DIAGNOSIS — I130 Hypertensive heart and chronic kidney disease with heart failure and stage 1 through stage 4 chronic kidney disease, or unspecified chronic kidney disease: Secondary | ICD-10-CM | POA: Diagnosis present

## 2022-08-24 DIAGNOSIS — J9601 Acute respiratory failure with hypoxia: Secondary | ICD-10-CM | POA: Diagnosis present

## 2022-08-24 DIAGNOSIS — I5033 Acute on chronic diastolic (congestive) heart failure: Secondary | ICD-10-CM | POA: Diagnosis present

## 2022-08-24 DIAGNOSIS — Z953 Presence of xenogenic heart valve: Secondary | ICD-10-CM | POA: Diagnosis not present

## 2022-08-24 DIAGNOSIS — Z6837 Body mass index (BMI) 37.0-37.9, adult: Secondary | ICD-10-CM

## 2022-08-24 DIAGNOSIS — N179 Acute kidney failure, unspecified: Secondary | ICD-10-CM | POA: Diagnosis present

## 2022-08-24 DIAGNOSIS — D72829 Elevated white blood cell count, unspecified: Secondary | ICD-10-CM | POA: Diagnosis present

## 2022-08-24 DIAGNOSIS — N1831 Chronic kidney disease, stage 3a: Secondary | ICD-10-CM | POA: Diagnosis present

## 2022-08-24 DIAGNOSIS — G4733 Obstructive sleep apnea (adult) (pediatric): Secondary | ICD-10-CM | POA: Diagnosis present

## 2022-08-24 DIAGNOSIS — Z8249 Family history of ischemic heart disease and other diseases of the circulatory system: Secondary | ICD-10-CM

## 2022-08-24 DIAGNOSIS — Z1152 Encounter for screening for COVID-19: Secondary | ICD-10-CM

## 2022-08-24 DIAGNOSIS — G8929 Other chronic pain: Secondary | ICD-10-CM | POA: Diagnosis present

## 2022-08-24 DIAGNOSIS — E1122 Type 2 diabetes mellitus with diabetic chronic kidney disease: Secondary | ICD-10-CM | POA: Diagnosis present

## 2022-08-24 DIAGNOSIS — E669 Obesity, unspecified: Secondary | ICD-10-CM | POA: Diagnosis present

## 2022-08-24 DIAGNOSIS — Z91199 Patient's noncompliance with other medical treatment and regimen due to unspecified reason: Secondary | ICD-10-CM

## 2022-08-24 DIAGNOSIS — R079 Chest pain, unspecified: Secondary | ICD-10-CM | POA: Diagnosis not present

## 2022-08-24 DIAGNOSIS — E876 Hypokalemia: Secondary | ICD-10-CM | POA: Diagnosis present

## 2022-08-24 DIAGNOSIS — Z85850 Personal history of malignant neoplasm of thyroid: Secondary | ICD-10-CM

## 2022-08-24 DIAGNOSIS — Z79890 Hormone replacement therapy (postmenopausal): Secondary | ICD-10-CM

## 2022-08-24 DIAGNOSIS — E039 Hypothyroidism, unspecified: Secondary | ICD-10-CM | POA: Diagnosis present

## 2022-08-24 DIAGNOSIS — I2489 Other forms of acute ischemic heart disease: Secondary | ICD-10-CM | POA: Diagnosis present

## 2022-08-24 DIAGNOSIS — I2510 Atherosclerotic heart disease of native coronary artery without angina pectoris: Secondary | ICD-10-CM | POA: Diagnosis present

## 2022-08-24 DIAGNOSIS — Z79899 Other long term (current) drug therapy: Secondary | ICD-10-CM

## 2022-08-24 DIAGNOSIS — I10 Essential (primary) hypertension: Secondary | ICD-10-CM | POA: Diagnosis present

## 2022-08-24 DIAGNOSIS — Z603 Acculturation difficulty: Secondary | ICD-10-CM | POA: Diagnosis present

## 2022-08-24 DIAGNOSIS — I452 Bifascicular block: Secondary | ICD-10-CM | POA: Diagnosis present

## 2022-08-24 DIAGNOSIS — N183 Chronic kidney disease, stage 3 unspecified: Secondary | ICD-10-CM | POA: Diagnosis present

## 2022-08-24 LAB — COMPREHENSIVE METABOLIC PANEL
ALT: 20 U/L (ref 0–44)
AST: 20 U/L (ref 15–41)
Albumin: 3.9 g/dL (ref 3.5–5.0)
Alkaline Phosphatase: 93 U/L (ref 38–126)
Anion gap: 12 (ref 5–15)
BUN: 39 mg/dL — ABNORMAL HIGH (ref 6–20)
CO2: 25 mmol/L (ref 22–32)
Calcium: 9 mg/dL (ref 8.9–10.3)
Chloride: 99 mmol/L (ref 98–111)
Creatinine, Ser: 1.84 mg/dL — ABNORMAL HIGH (ref 0.61–1.24)
GFR, Estimated: 42 mL/min — ABNORMAL LOW (ref >60)
Glucose, Bld: 226 mg/dL — ABNORMAL HIGH (ref 70–99)
Potassium: 3.3 mmol/L — ABNORMAL LOW (ref 3.5–5.1)
Sodium: 136 mmol/L (ref 135–145)
Total Bilirubin: 0.5 mg/dL (ref 0.3–1.2)
Total Protein: 8.2 g/dL — ABNORMAL HIGH (ref 6.5–8.1)

## 2022-08-24 LAB — CBC WITH DIFFERENTIAL/PLATELET
Abs Immature Granulocytes: 0.03 10*3/uL (ref 0.00–0.07)
Basophils Absolute: 0.1 10*3/uL (ref 0.0–0.1)
Basophils Relative: 1 %
Eosinophils Absolute: 1 10*3/uL — ABNORMAL HIGH (ref 0.0–0.5)
Eosinophils Relative: 9 %
HCT: 35.5 % — ABNORMAL LOW (ref 39.0–52.0)
Hemoglobin: 10.9 g/dL — ABNORMAL LOW (ref 13.0–17.0)
Immature Granulocytes: 0 %
Lymphocytes Relative: 23 %
Lymphs Abs: 2.6 10*3/uL (ref 0.7–4.0)
MCH: 25.2 pg — ABNORMAL LOW (ref 26.0–34.0)
MCHC: 30.7 g/dL (ref 30.0–36.0)
MCV: 82 fL (ref 80.0–100.0)
Monocytes Absolute: 0.8 10*3/uL (ref 0.1–1.0)
Monocytes Relative: 7 %
Neutro Abs: 6.7 10*3/uL (ref 1.7–7.7)
Neutrophils Relative %: 60 %
Platelets: 356 10*3/uL (ref 150–400)
RBC: 4.33 MIL/uL (ref 4.22–5.81)
RDW: 15.1 % (ref 11.5–15.5)
WBC: 11.3 10*3/uL — ABNORMAL HIGH (ref 4.0–10.5)
nRBC: 0 % (ref 0.0–0.2)

## 2022-08-24 LAB — RESP PANEL BY RT-PCR (RSV, FLU A&B, COVID)  RVPGX2
Influenza A by PCR: NEGATIVE
Influenza B by PCR: NEGATIVE
Resp Syncytial Virus by PCR: NEGATIVE
SARS Coronavirus 2 by RT PCR: NEGATIVE

## 2022-08-24 LAB — CBG MONITORING, ED: Glucose-Capillary: 165 mg/dL — ABNORMAL HIGH (ref 70–99)

## 2022-08-24 LAB — LACTIC ACID, PLASMA
Lactic Acid, Venous: 2 mmol/L (ref 0.5–1.9)
Lactic Acid, Venous: 1.6 mmol/L (ref 0.5–1.9)

## 2022-08-24 LAB — MAGNESIUM: Magnesium: 1.8 mg/dL (ref 1.7–2.4)

## 2022-08-24 LAB — BRAIN NATRIURETIC PEPTIDE: B Natriuretic Peptide: 236.2 pg/mL — ABNORMAL HIGH (ref 0.0–100.0)

## 2022-08-24 LAB — GLUCOSE, CAPILLARY: Glucose-Capillary: 179 mg/dL — ABNORMAL HIGH (ref 70–99)

## 2022-08-24 LAB — TROPONIN I (HIGH SENSITIVITY)
Troponin I (High Sensitivity): 40 ng/L — ABNORMAL HIGH (ref <18)
Troponin I (High Sensitivity): 65 ng/L — ABNORMAL HIGH (ref <18)
Troponin I (High Sensitivity): 82 ng/L — ABNORMAL HIGH (ref <18)
Troponin I (High Sensitivity): 91 ng/L — ABNORMAL HIGH (ref <18)
Troponin I (High Sensitivity): 87 ng/L — ABNORMAL HIGH (ref <18)

## 2022-08-24 MED ORDER — LEVOTHYROXINE SODIUM 100 MCG PO TABS
300.0000 ug | ORAL_TABLET | Freq: Every day | ORAL | Status: DC
  Administered 2022-08-25 – 2022-08-26 (×2): 300 ug via ORAL
  Filled 2022-08-24 (×2): qty 3

## 2022-08-24 MED ORDER — ATORVASTATIN CALCIUM 80 MG PO TABS
80.0000 mg | ORAL_TABLET | Freq: Every evening | ORAL | Status: DC
  Administered 2022-08-24 – 2022-08-25 (×2): 80 mg via ORAL
  Filled 2022-08-24 (×2): qty 1

## 2022-08-24 MED ORDER — MIDAZOLAM HCL 2 MG/2ML IJ SOLN
2.0000 mg | Freq: Once | INTRAMUSCULAR | Status: DC
  Filled 2022-08-24: qty 2

## 2022-08-24 MED ORDER — RIVAROXABAN 20 MG PO TABS
20.0000 mg | ORAL_TABLET | Freq: Every day | ORAL | Status: DC
  Administered 2022-08-24 – 2022-08-25 (×2): 20 mg via ORAL
  Filled 2022-08-24 (×2): qty 1

## 2022-08-24 MED ORDER — SODIUM CHLORIDE 0.9 % IV BOLUS
1000.0000 mL | Freq: Once | INTRAVENOUS | Status: AC
  Administered 2022-08-24: 1000 mL via INTRAVENOUS

## 2022-08-24 MED ORDER — MORPHINE SULFATE (PF) 2 MG/ML IV SOLN: 1.0000 mg | INTRAVENOUS | Status: DC | PRN

## 2022-08-24 MED ORDER — DM-GUAIFENESIN ER 30-600 MG PO TB12: 1.0000 | ORAL_TABLET | Freq: Two times a day (BID) | ORAL | Status: DC | PRN

## 2022-08-24 MED ORDER — POTASSIUM CHLORIDE CRYS ER 20 MEQ PO TBCR
60.0000 meq | EXTENDED_RELEASE_TABLET | Freq: Once | ORAL | Status: AC
  Administered 2022-08-24: 60 meq via ORAL
  Filled 2022-08-24: qty 3

## 2022-08-24 MED ORDER — PANTOPRAZOLE SODIUM 20 MG PO TBEC
20.0000 mg | DELAYED_RELEASE_TABLET | Freq: Two times a day (BID) | ORAL | Status: DC
  Administered 2022-08-24 – 2022-08-26 (×4): 20 mg via ORAL
  Filled 2022-08-24 (×4): qty 1

## 2022-08-24 MED ORDER — INSULIN ASPART 100 UNIT/ML IJ SOLN
0.0000 [IU] | Freq: Every day | INTRAMUSCULAR | Status: DC
  Administered 2022-08-25: 2 [IU] via SUBCUTANEOUS
  Filled 2022-08-24: qty 1

## 2022-08-24 MED ORDER — ALBUTEROL SULFATE (2.5 MG/3ML) 0.083% IN NEBU: 3.0000 mL | INHALATION_SOLUTION | RESPIRATORY_TRACT | Status: DC | PRN

## 2022-08-24 MED ORDER — GABAPENTIN 300 MG PO CAPS
300.0000 mg | ORAL_CAPSULE | Freq: Two times a day (BID) | ORAL | Status: DC
  Administered 2022-08-24 – 2022-08-26 (×4): 300 mg via ORAL
  Filled 2022-08-24 (×4): qty 1

## 2022-08-24 MED ORDER — FENTANYL CITRATE PF 50 MCG/ML IJ SOSY
50.0000 ug | PREFILLED_SYRINGE | Freq: Once | INTRAMUSCULAR | Status: AC
  Administered 2022-08-24: 50 ug via INTRAVENOUS
  Filled 2022-08-24: qty 1

## 2022-08-24 MED ORDER — ACETAMINOPHEN 325 MG PO TABS
650.0000 mg | ORAL_TABLET | Freq: Four times a day (QID) | ORAL | Status: DC | PRN
  Administered 2022-08-24 – 2022-08-25 (×3): 650 mg via ORAL
  Filled 2022-08-24 (×3): qty 2

## 2022-08-24 MED ORDER — IOHEXOL 350 MG/ML SOLN
60.0000 mL | Freq: Once | INTRAVENOUS | Status: AC | PRN
  Administered 2022-08-24: 60 mL via INTRAVENOUS

## 2022-08-24 MED ORDER — INSULIN ASPART 100 UNIT/ML IJ SOLN
0.0000 [IU] | Freq: Three times a day (TID) | INTRAMUSCULAR | Status: DC
  Administered 2022-08-24 – 2022-08-26 (×5): 2 [IU] via SUBCUTANEOUS
  Filled 2022-08-24 (×4): qty 1

## 2022-08-24 MED ORDER — DIPHENHYDRAMINE HCL 50 MG/ML IJ SOLN: 12.5000 mg | Freq: Three times a day (TID) | INTRAMUSCULAR | Status: DC | PRN

## 2022-08-24 NOTE — ED Notes (Signed)
Pt denies any pain currently. Given cup of water.

## 2022-08-24 NOTE — ED Notes (Signed)
Pt reports pain is increasing. MD and primary RN made aware.

## 2022-08-24 NOTE — ED Provider Notes (Signed)
[facility] Provider Note    Event Date/Time   First MD Initiated Contact with Patient 08/24/22 [phone]     (approximate)   History   Chest Pain   HPI  Craig Reese is a 62 y.o. male with a history of CAD status post CABG and stents, thyroid carcinoma status post lateral neck dissection, hyperlipidemia, hypertension, heart failure preserved ejection fraction, aortic valve stenosis status post open AVR, paroxysmal afib on xarelto, DM and OSA who presents with pain, acute on this morning associated with some shortness of breath and dizziness.  When EMS arrived they found the patient to be in atrial fibrillation with a rate around 100-120.  Subsequently he became bradycardic to the 40s and hypotensive.  I reviewed the past medical records.  The patient was just admitted between 4/20 and 4/5 after also presenting with chest pain and shortness of breath.  Cardiac workup was reassuring at that time including cardiac enzymes and echocardiogram.   Physical Exam   Triage Vital Signs: ED Triage Vitals  Enc Vitals Group     BP 08/24/22 0820 (!) 123/90     Pulse Rate 08/24/22 0820 (!) 115     Resp 08/24/22 0820 (!) 22     Temp 08/24/22 0820 97.6 F (36.4 C)     Temp Source 08/24/22 0820 Oral     SpO2 08/24/22 0820 96 %     Weight 08/24/22 0822 260 lb (117.9 kg)     Height 08/24/22 0822 5\' 10"  (1.778 m)     Head Circumference --      Peak Flow --      Pain Score 08/24/22 0820 10     Pain Loc --      Pain Edu? --      Excl. in GC? --     Most recent vital signs: Vitals:   08/24/22 1415 08/24/22 1430  BP: 101/70 116/64  Pulse: 66 70  Resp: 15 16  Temp:    SpO2: 100% 100%     General: Awake, weak appearing but in no acute distress. CV:  Good peripheral perfusion.  Tachycardic, irregular rhythm. Resp:  Normal effort.  Lungs CTAB. Abd:  No distention.  Other:  EOMI.  PERRLA.  Motor intact in all extremities.  Somewhat pale appearing.   ED Results /  Procedures / Treatments   Labs (all labs ordered are listed, but only abnormal results are displayed) Labs Reviewed  COMPREHENSIVE METABOLIC PANEL - Abnormal; Notable for the following components:      Result Value   Potassium 3.3 (*)    Glucose, Bld 226 (*)    BUN 39 (*)    Creatinine, Ser 1.84 (*)    Total Protein 8.2 (*)    GFR, Estimated 42 (*)    All other components within normal limits  CBC WITH DIFFERENTIAL/PLATELET - Abnormal; Notable for the following components:   WBC 11.3 (*)    Hemoglobin 10.9 (*)    HCT 35.5 (*)    MCH 25.2 (*)    Eosinophils Absolute 1.0 (*)    All other components within normal limits  BRAIN NATRIURETIC PEPTIDE - Abnormal; Notable for the following components:   B Natriuretic Peptide 236.2 (*)    All other components within normal limits  LACTIC ACID, PLASMA - Abnormal; Notable for the following components:   Lactic Acid, Venous 2.0 (*)    All other components within normal limits  TROPONIN I (HIGH SENSITIVITY) - Abnormal; Notable for the  following components:   Troponin I (High Sensitivity) 40 (*)    All other components within normal limits  TROPONIN I (HIGH SENSITIVITY) - Abnormal; Notable for the following components:   Troponin I (High Sensitivity) 65 (*)    All other components within normal limits  RESP PANEL BY RT-PCR (RSV, FLU A&B, COVID)  RVPGX2  CULTURE, BLOOD (ROUTINE X 2)  CULTURE, BLOOD (ROUTINE X 2)  LACTIC ACID, PLASMA  MAGNESIUM  URINALYSIS, W/ REFLEX TO CULTURE (INFECTION SUSPECTED)  HEMOGLOBIN A1C  TROPONIN I (HIGH SENSITIVITY)  TROPONIN I (HIGH SENSITIVITY)     EKG  ED ECG REPORT I, Dionne BucySebastian Kimiye Strathman, the attending physician, personally viewed and interpreted this ECG.  Date: 08/24/2022 EKG Time: 0819 Rate: 123 Rhythm: Atrial fibrillation with RVR QRS Axis: normal Intervals: RBBB, LAFB ST/T Wave abnormalities: normal Narrative Interpretation: Atrial fibrillation with no evidence of acute  ischemia    RADIOLOGY  Chest x-ray: I independently viewed and interpreted the images; there is no focal consolidation or edema  CT angio chest:  IMPRESSION:  Suboptimal opacification of pulmonary arteries, however no pulmonary  embolism identified. No other acute findings.    Stable 4.1 cm thoracic aortic aneurysm. Recommend annual imaging  followup by CTA or MRA. This recommendation follows 2010  ACCF/AHA/AATS/ACR/ASA/SCA/SCAI/SIR/STS/SVM Guidelines for the  Diagnosis and Management of Patients with Thoracic Aortic Disease.  Circulation. 2010; 121: Z610-R604: [id number]. Aortic aneurysm NOS (ICD10-I71.9)    Nonobstructing left renal calculus.    Aortic Atherosclerosis (ICD10-I70.0).    PROCEDURES:  Critical Care performed: Yes, see critical care procedure note(s)  .Critical Care  Performed by: Dionne BucySiadecki, Jenisis Harmsen, MD Authorized by: Dionne BucySiadecki, May Ozment, MD   Critical care provider statement:    Critical care time (minutes):  60   Critical care time was exclusive of:  Separately billable procedures and treating other patients   Critical care was necessary to treat or prevent imminent or life-threatening deterioration of the following conditions:  Cardiac failure   Critical care was time spent personally by me on the following activities:  Development of treatment plan with patient or surrogate, discussions with consultants, evaluation of patient's response to treatment, examination of patient, ordering and review of laboratory studies, ordering and review of radiographic studies, ordering and performing treatments and interventions, pulse oximetry, re-evaluation of patient's condition, review of old charts and obtaining history from patient or surrogate   Care discussed with: admitting provider      MEDICATIONS ORDERED IN ED: Medications  acetaminophen (TYLENOL) tablet 650 mg (650 mg Oral Given 08/24/22 1311)  albuterol (PROVENTIL) (2.5 MG/3ML) 0.083% nebulizer solution 3 mL (has no  administration in time range)  dextromethorphan-guaiFENesin (MUCINEX DM) 30-600 MG per 12 hr tablet 1 tablet (has no administration in time range)  diphenhydrAMINE (BENADRYL) injection 12.5 mg (has no administration in time range)  insulin aspart (novoLOG) injection 0-9 Units (has no administration in time range)  insulin aspart (novoLOG) injection 0-5 Units (has no administration in time range)  fentaNYL (SUBLIMAZE) injection 50 mcg (50 mcg Intravenous Given 08/24/22 0831)  sodium chloride 0.9 % bolus 1,000 mL (0 mLs Intravenous Stopped 08/24/22 1114)  iohexol (OMNIPAQUE) 350 MG/ML injection 60 mL (60 mLs Intravenous Contrast Given 08/24/22 0945)  potassium chloride SA (KLOR-CON M) CR tablet 60 mEq (60 mEq Oral Given 08/24/22 1143)     IMPRESSION / MDM / ASSESSMENT AND PLAN / ED COURSE  I reviewed the triage vital signs and the nursing notes.  62 year old male with PMH as noted above presents with acute  onset of chest pain and some shortness of breath and lightheadedness.  During EMS transport the patient became bradycardic and hypotensive.  On ED arrival he is somewhat pale and weak appearing although his vital signs have improved.  Heart rate is in the 110s to 120s and blood pressure is normal.  EKG does not show evidence of acute ischemia.  Differential diagnosis includes, but is not limited to, ACS, aortic stenosis, exacerbation of his paroxysmal atrial fibrillation, less likely PE.  I do not suspect aortic dissection or other vascular etiology.  We will keep the patient on the monitor, obtain basic labs, cardiac enzymes, BNP to rule out acute CHF, chest x-ray, and reassess.  He likely will need a CT angio to rule out PE.  I anticipate admission.  Patient's presentation is most consistent with acute presentation with potential threat to life or bodily function.  The patient is on the cardiac monitor to evaluate for evidence of arrhythmia and/or significant heart rate changes.    ----------------------------------------- 11:19 AM on 08/24/2022 -----------------------------------------   The patient once again became hypotensive, so I started IV fluids.  Blood pressure ranged between 70s/50s and 110s systolic and the heart rate ranged between 105-120.  Given the persistent rapid atrial fibrillation with hypotension and chest pain, I prepared to cardiovert the patient however as we were getting ready to cardiovert the patient spontaneously converted to sinus rhythm.  Blood pressure remained low although started to improve with fluids.  The patient's chest pain improved after he went back into sinus rhythm.  He appeared much more comfortable.  Initial troponin was minimally elevated consistent with the patient's recent labs.  BNP is also minimally elevated.  Chest x-ray shows some congestion but no significant edema.  Respiratory panel is negative.  Lactate is not significantly elevated.  I do not suspect sepsis.   I consulted cardiology and discussed the case with the on-call cardiologist who agreed with the current management.  I then consulted Dr. Clyde Lundborg from the hospitalist service; based on our discussion he agreed admit the patient.  FINAL CLINICAL IMPRESSION(S) / ED DIAGNOSES   Final diagnoses:  Chest pain, unspecified type  Atrial fibrillation with RVR  Hypotension, unspecified hypotension type     Rx / DC Orders   ED Discharge Orders     None        Note:  This document was prepared using Dragon voice recognition software and may include unintentional dictation errors.    Dionne Bucy, MD 08/24/22 1538

## 2022-08-24 NOTE — ED Triage Notes (Signed)
Pt BIB [facility] from home C/O CP since 0600. Per EMS, initial HR 100-140 Afib, BP 110/80. En route, HR dropped to 40 and BP 60/30 with thready radial pulse and 86% RA. Improved to 96/palp with 500 mL bolus. Arrives on NRB @ 25 L/min with SaO2 86%. MD at bedside on arrival.

## 2022-08-24 NOTE — H&P (Signed)
History and Physical    Craig Reese RUE:[id number] DOB: 08/10/1960 DOA: 08/24/2022  Referring MD/NP/PA:   PCP: System, Provider Not In   Patient coming from:  The patient is coming from home.     Chief Complaint: chest pain and SOB  HPI: Craig Reese is a 62 y.o. male with medical history significant of dCHF, CAD, s/p of CABG, HTN, HLD, DM, OSA not on CPAP, hypothyroidism, CKD-3A, afebrile Xarelto, medication noncompliance, who presents with chest pain and shortness of breath.    Per pt and his nephew at bedside, pt started having chest pain and SOB in the early morning. His chest pain is located at substernal area, pressure-like, moderate, nonradiating.  Associated with the shortness of breath.  No cough, fever or chills.  Patient is normally not using oxygen, but was found to have oxygen desaturation to 86% on room air, initially required nonrebreather, then weaned off to room air when I saw patient in the ED, with 97% of saturation on room air. Patient was initially hypotensive with blood pressure 75/57, which improved to 100/68 after giving 1 L normal saline in ED. Patient does not have nausea, vomiting, diarrhea or abdominal pain.  Patient has noncompliance.  He has not taken his medications for more than a month, but he seems to be taking Xarelto.  Cardiac cath at [facility] by Dr. Dayle Points on 07/31/22 LM - patent LAD - with 40-50% moderate disease LCx - with 80% blockage in the proximal portion - with SVG to the OM. RCA - with 50-60% blockages in the proximal region. SVG to the OM - there is 50% blockage in the proximal vein graft.  Impression:  - his CAD is relatively unchanged from his cardiac cath in 2022. He has a proximal SVG disease and LAD disease that is moderate, and unchanged from his previous cath.  - his symptoms of chest discomfort and dyspnea may be related to OSA. Consider sleep study. - recommend managing his moderate disease medically at this time.  Data reviewed  independently and ED Course: pt was found to have troponin 40 -->  65, BNP 236, negative COVID PCR, worsening renal function, potassium 3.3, temperature normal, heart rate 37 --> 115 --> 70, RR 28 --> 15.  Chest x-ray showed mild interstitial edema.  CTA negative for PE.  Patient is admitted to PCU as inpatient.  Dr. Cristal Deer of cardiology is consulted.   EKG: I have personally reviewed.  Atrial fibrillation, QTc 560, bifascicular block, poor R wave progression, with a lot of artificial effects.   Review of Systems:   General: no fevers, chills, no body weight gain, has poor appetite, has fatigue HEENT: no blurry vision, hearing changes or sore throat Respiratory: has dyspnea, no coughing, wheezing CV: has chest pain, no palpitations GI: no nausea, vomiting, abdominal pain, diarrhea, constipation GU: no dysuria, burning on urination, increased urinary frequency, hematuria  Ext: trace leg edema Neuro: no unilateral weakness, numbness, or tingling, no vision change or hearing loss Skin: no rash, no skin tear. MSK: No muscle spasm, no deformity, no limitation of range of movement in spin Heme: No easy bruising.  Travel history: No recent long distant travel.   Allergy: No Known Allergies  Past Medical History:  Diagnosis Date   AF (paroxysmal atrial fibrillation) 06/06/2018   CAD (coronary artery disease) 05/13/2018   Chronic diastolic CHF (congestive heart failure) 11/17/2018   Coronary artery disease    Diabetes mellitus type II, non insulin dependent 11/17/2018   Hypothyroidism 11/17/2018  Past Surgical History:  Procedure Laterality Date   CORONARY ARTERY BYPASS GRAFT     LEFT HEART CATH AND CORS/GRAFTS ANGIOGRAPHY N/A 05/17/2018   Procedure: LEFT HEART CATH AND CORS/GRAFTS ANGIOGRAPHY and possible PCI and stent;  Surgeon: Alwyn Pea, MD;  Location: [facility] INVASIVE CV LAB;  Service: Cardiovascular;  Laterality: N/A;    Social History:  reports that he has never smoked.  He has never used smokeless tobacco. He reports that he does not currently use alcohol. He reports that he does not use drugs.  Family History:  Family History  Problem Relation Age of Onset   Heart disease Brother      Prior to Admission medications   Medication Sig Start Date End Date Taking? Authorizing Provider  furosemide (LASIX) 40 MG tablet Take 1 tablet (40 mg total) by mouth daily. 03/09/22  Yes Sreenath, Sudheer B, MD  metoprolol tartrate (LOPRESSOR) 50 MG tablet Take 1 tablet (50 mg total) by mouth 2 (two) times daily. 11/21/18  Yes Elgergawy, Leana Roe, MD  acetaminophen (TYLENOL) 500 MG tablet Take 500-1,000 mg by mouth every 6 (six) hours as needed for mild pain or fever.    [provider]  atorvastatin (LIPITOR) 80 MG tablet Take 80 mg by mouth every evening.    [provider]  canagliflozin (INVOKANA) 300 MG TABS tablet Take 300 mg by mouth in the morning.    [provider]  chlorthalidone (HYGROTON) 25 MG tablet Take 25 mg by mouth in the morning.    [provider]  gabapentin (NEURONTIN) 300 MG capsule Take 300 mg by mouth 2 (two) times daily.    [provider]  hydrALAZINE (APRESOLINE) 50 MG tablet Take 100 mg by mouth 2 (two) times daily.    [provider]  levothyroxine (SYNTHROID) 200 MCG tablet Take 1 tablet (200 mcg total) by mouth daily before breakfast. Patient not taking: Reported on 08/24/2022 03/10/22 04/09/22  Tresa Moore, MD  levothyroxine (SYNTHROID) 300 MCG tablet Take 300 mcg by mouth daily before breakfast. 05/10/22 05/10/23  [provider]  losartan (COZAAR) 100 MG tablet Take 100 mg by mouth daily.    [provider]  metFORMIN (GLUCOPHAGE) 1000 MG tablet Take 1,000 mg by mouth 2 (two) times daily with a meal.    [provider]  pantoprazole (PROTONIX) 20 MG tablet Take 1 tablet (20 mg total) by mouth 2 (two) times daily. 03/09/22 04/08/22  Tresa Moore, MD   rivaroxaban (XARELTO) 20 MG TABS tablet Take 1 tablet (20 mg total) by mouth daily. Patient taking differently: Take 20 mg by mouth daily with supper. 04/08/20   Alford Highland, MD  spironolactone (ALDACTONE) 25 MG tablet Take 1 tablet (25 mg total) by mouth daily. 03/10/22 04/09/22  Tresa Moore, MD  sucralfate (CARAFATE) 1 GM/10ML suspension Take 1 g by mouth 4 (four) times daily.    [provider]    Physical Exam: Vitals:   08/24/22 1345 08/24/22 1400 08/24/22 1415 08/24/22 1430  BP: 100/65 107/76 101/70 116/64  Pulse: 76 72 66 70  Resp: 15 11 15 16   Temp:      TempSrc:      SpO2: 98% 97% 100% 100%  Weight:      Height:       General: Not in acute distress HEENT:       Eyes: PERRL, EOMI, no scleral icterus.       ENT: No discharge from the ears and nose,  no pharynx injection, no tonsillar enlargement.        Neck: Difficult to assess JVD due to obesity, no bruit, no mass felt. Heme: No neck lymph node enlargement. Cardiac: S1/S2, irregularly irregular rhythm, No murmurs, No gallops or rubs. Respiratory: Has crackles bilaterally GI: Soft, nondistended, nontender, no rebound pain, no organomegaly, BS present. GU: No hematuria Ext: has trace leg edema bilaterally. 1+DP/PT pulse bilaterally. Musculoskeletal: No joint deformities, No joint redness or warmth, no limitation of ROM in spin. Skin: No rashes.  Neuro: Alert, oriented X3, cranial nerves II-XII grossly intact, moves all extremities normally. Psych: Patient is not psychotic, no suicidal or hemocidal ideation.  Labs on Admission: I have personally reviewed following labs and imaging studies  CBC: Recent Labs  Lab 08/20/22 1319 08/24/22 0826  WBC 8.7 11.3*  NEUTROABS  --  6.7  HGB 10.8* 10.9*  HCT 34.0* 35.5*  MCV 80.0 82.0  PLT 352 356   Basic Metabolic Panel: Recent Labs  Lab 08/20/22 1319 08/21/22 0527 08/24/22 0826 08/24/22 1100  NA 137  --  136  --   K 2.9*  --  3.3*  --   CL 100   --  99  --   CO2 25  --  25  --   GLUCOSE 222*  --  226*  --   BUN 44*  --  39*  --   CREATININE 1.56*  --  1.84*  --   CALCIUM 9.5  --  9.0  --   MG 2.2 2.1  --  1.8   GFR: Estimated Creatinine Clearance: 55.6 mL/min (A) (by C-G formula based on SCr of 1.84 mg/dL (H)). Liver Function Tests: Recent Labs  Lab 08/20/22 1319 08/24/22 0826  AST 32 20  ALT 28 20  ALKPHOS 74 93  BILITOT 0.6 0.5  PROT 8.4* 8.2*  ALBUMIN 4.0 3.9   No results for input(s): "LIPASE", "AMYLASE" in the last 168 hours. No results for input(s): "AMMONIA" in the last 168 hours. Coagulation Profile: No results for input(s): "INR", "PROTIME" in the last 168 hours. Cardiac Enzymes: No results for input(s): "CKTOTAL", "CKMB", "CKMBINDEX", "TROPONINI" in the last 168 hours. BNP (last 3 results) No results for input(s): "PROBNP" in the last 8760 hours. HbA1C: No results for input(s): "HGBA1C" in the last 72 hours. CBG: Recent Labs  Lab 08/21/22 2158 08/22/22 0917 08/22/22 1203 08/22/22 1638 08/24/22 1635  GLUCAP 254* 147* 192* 191* 165*   Lipid Profile: Recent Labs    08/22/22 0444  CHOL 114  HDL 24*  LDLCALC 54  TRIG 275*  CHOLHDL 4.8   Thyroid Function Tests: No results for input(s): "TSH", "T4TOTAL", "FREET4", "T3FREE", "THYROIDAB" in the last 72 hours. Anemia Panel: No results for input(s): "VITAMINB12", "FOLATE", "FERRITIN", "TIBC", "IRON", "RETICCTPCT" in the last 72 hours. Urine analysis:    Component Value Date/Time   COLORURINE Yellow 09/22/2013 2248   APPEARANCEUR Clear 09/22/2013 2248   LABSPEC >1.060 09/22/2013 2248   PHURINE 5.0 09/22/2013 2248   GLUCOSEU Negative 09/22/2013 2248   HGBUR 1+ 09/22/2013 2248   BILIRUBINUR Negative 09/22/2013 2248   KETONESUR Negative 09/22/2013 2248   PROTEINUR Negative 09/22/2013 2248   NITRITE Negative 09/22/2013 2248   LEUKOCYTESUR Negative 09/22/2013 2248   Sepsis Labs: @LABRCNTIP (procalcitonin:4,lacticidven:4) ) Recent Results  (from the past 240 hour(s))  Resp panel by RT-PCR (RSV, Flu A&B, Covid) Anterior Nasal Swab     Status: None   Collection Time: 08/24/22  9:18 AM   Specimen: Anterior Nasal  Swab  Result Value Ref Range Status   SARS Coronavirus 2 by RT PCR NEGATIVE NEGATIVE Final    Comment: (NOTE) SARS-CoV-2 target nucleic acids are NOT DETECTED.  The SARS-CoV-2 RNA is generally detectable in upper respiratory specimens during the acute phase of infection. The lowest concentration of SARS-CoV-2 viral copies this assay can detect is 138 copies/mL. A negative result does not preclude SARS-Cov-2 infection and should not be used as the sole basis for treatment or other patient management decisions. A negative result may occur with  improper specimen collection/handling, submission of specimen other than nasopharyngeal swab, presence of viral mutation(s) within the areas targeted by this assay, and inadequate number of viral copies(<138 copies/mL). A negative result must be combined with clinical observations, patient history, and epidemiological information. The expected result is Negative.  Fact Sheet for Patients:  BloggerCourse.com  Fact Sheet for Healthcare Providers:  SeriousBroker.it  This test is no t yet approved or cleared by the [place] FDA and  has been authorized for detection and/or diagnosis of SARS-CoV-2 by FDA under an Emergency Use Authorization (EUA). This EUA will remain  in effect (meaning this test can be used) for the duration of the COVID-19 declaration under Section 564(b)(1) of the Act, 21 U.S.C.section 360bbb-3(b)(1), unless the authorization is terminated  or revoked sooner.       Influenza A by PCR NEGATIVE NEGATIVE Final   Influenza B by PCR NEGATIVE NEGATIVE Final    Comment: (NOTE) The Xpert Xpress SARS-CoV-2/FLU/RSV plus assay is intended as an aid in the diagnosis of influenza from Nasopharyngeal swab  specimens and should not be used as a sole basis for treatment. Nasal washings and aspirates are unacceptable for Xpert Xpress SARS-CoV-2/FLU/RSV testing.  Fact Sheet for Patients: BloggerCourse.com  Fact Sheet for Healthcare Providers: SeriousBroker.it  This test is not yet approved or cleared by the [place] FDA and has been authorized for detection and/or diagnosis of SARS-CoV-2 by FDA under an Emergency Use Authorization (EUA). This EUA will remain in effect (meaning this test can be used) for the duration of the COVID-19 declaration under Section 564(b)(1) of the Act, 21 U.S.C. section 360bbb-3(b)(1), unless the authorization is terminated or revoked.     Resp Syncytial Virus by PCR NEGATIVE NEGATIVE Final    Comment: (NOTE) Fact Sheet for Patients: BloggerCourse.com  Fact Sheet for Healthcare Providers: SeriousBroker.it  This test is not yet approved or cleared by the [place] FDA and has been authorized for detection and/or diagnosis of SARS-CoV-2 by FDA under an Emergency Use Authorization (EUA). This EUA will remain in effect (meaning this test can be used) for the duration of the COVID-19 declaration under Section 564(b)(1) of the Act, 21 U.S.C. section 360bbb-3(b)(1), unless the authorization is terminated or revoked.  Performed at [facility], [address], [place], [place] [address]      Radiological Exams on Admission: CT Angio Chest PE W and/or Wo Contrast  Result Date: 08/24/2022 CLINICAL DATA:  Acute onset chest pain and tachycardia. Hypoxia. High probability for pulmonary embolism. EXAM: CT ANGIOGRAPHY CHEST WITH CONTRAST TECHNIQUE: Multidetector CT imaging of the chest was performed using the standard protocol during bolus administration of intravenous contrast. Multiplanar CT image reconstructions and MIPs were obtained to evaluate the  vascular anatomy. RADIATION DOSE REDUCTION: This exam was performed according to the departmental dose-optimization program which includes automated exposure control, adjustment of the mA and/or kV according to patient size and/or use of iterative reconstruction technique. CONTRAST:  60mL OMNIPAQUE  IOHEXOL 350 MG/ML SOLN COMPARISON:  04/07/2020 FINDINGS: Cardiovascular: Opacification of pulmonary arteries is suboptimal, however no pulmonary emboli are identified. No evidence of thoracic aortic dissection. 4.1 cm thoracic aortic aneurysm remains stable. Prior CABG again noted. Stable moderate cardiomegaly and left ventricular hypertrophy. Mediastinum/Nodes: No masses or pathologically enlarged lymph nodes identified. Lungs/Pleura: No pulmonary mass, infiltrate, or effusion. Upper abdomen: Small calculus noted in lower pole of left kidney. No evidence of hydronephrosis. Musculoskeletal: No suspicious bone lesions identified. Review of the MIP images confirms the above findings. IMPRESSION: Suboptimal opacification of pulmonary arteries, however no pulmonary embolism identified. No other acute findings. Stable 4.1 cm thoracic aortic aneurysm. Recommend annual imaging followup by CTA or MRA. This recommendation follows 2010 ACCF/AHA/AATS/ACR/ASA/SCA/SCAI/SIR/STS/SVM Guidelines for the Diagnosis and Management of Patients with Thoracic Aortic Disease. Circulation. 2010; 121: V409-W119. Aortic aneurysm NOS (ICD10-I71.9) Nonobstructing left renal calculus. Aortic Atherosclerosis (ICD10-I70.0). Electronically Signed   By: Danae Orleans M.D.   On: 08/24/2022 10:22   DG Chest Port 1 View  Result Date: 08/24/2022 CLINICAL DATA:  Shortness of breath EXAM: PORTABLE CHEST 1 VIEW COMPARISON:  08/20/2022 FINDINGS: Stable cardiomegaly. Prior sternotomy. Aortic atherosclerosis. Pulmonary vascular congestion. Mild bilateral interstitial prominence. No lobar consolidation. No pleural effusion or pneumothorax. IMPRESSION: Findings  suggestive of CHF with mild interstitial edema. Electronically Signed   By: Duanne Guess D.O.   On: 08/24/2022 09:15      Assessment/Plan Principal Problem:   Acute on chronic diastolic congestive heart failure Active Problems:   CAD (coronary artery disease)   Chest pain   Atrial fibrillation with RVR   Hypotension   HLD (hyperlipidemia)   HTN (hypertension)   CKD stage 3a, GFR 45-59 ml/min   Hypokalemia   Hypothyroidism   Severe obstructive sleep apnea   Obesity (BMI 30-39.9)   Assessment and Plan:  Acute on chronic diastolic congestive heart failure: 2D echo on 08/21/2022 showed EF of 55 to 60% with grade 2 diastolic dysfunction.  Patient has leg edema, elevated BNP 236, interstitial edema by chest x-ray, clinically consistent with CHF exacerbation.  Initially patient had oxygen desaturation to 86%, required nonrebreather which is already weaned off.  Currently patient is on room air with 97% of saturation.  Since patient has hypotension, I will not start IV Lasix since patient has normal saturation on room air.  Consulted Dr. Cristal Deer for cardiology.  -Will admit to PCU as inpatient -Daily weights -strict I/O's -Low salt diet -Fluid restriction -Obtain REDs Vest reading -As needed nasal cannula oxygen to maintain oxygen saturation above 93%. -Will start IV Lasix when blood pressure is stabilized.  CAD (coronary artery disease) and chest pain: trop 40 --> 65, possibly due to demand ischemia. -Will not start aspirin since patient is on Xarelto. Patient had LDL 54 on 08/22/22 -Lipitor -Trend troponin -Check A1c  -prn morphine chest pain -will not give nitroglycerin due to hypotension  Atrial fibrillation with RVR -Continue Xarelto -Hold metoprolol due to hypotension  Hypotension: Etiology is not clear.  Blood pressure responded to IV fluid.  Patient has a mild leukocytosis with WBC 11.4, but no source of infection identified.  No fever. -Follow-up urinalysis and  blood culture -Hold all blood pressure medications and diuretics (Lasix, spironolactone, hydralazine, Cozaar, metoprolol, hypertension)  HLD (hyperlipidemia) -Lipitor  HTN (hypertension) Hold-all blood pressure medications  CKD stage 3a, GFR 45-59 ml/min: Slightly worsened than baseline.  Recent baseline creatinine 1.56 point 08/20/2022.  His creatinine is 1.84, BUN 39, GFR 42 -Follow-up by BMP -Hold diuretics -Patient  received 1 L normal saline  Hypokalemia: Potassium 3.3.  Magnesium 1.8 -Repeat potassium  Hypothyroidism -Synthroid  Severe obstructive sleep apnea -Patient is not using CPAP  Obesity (BMI 30-39.9): Body weight 117.9 kg, BMI 37.31 -Encouraged losing weight -Exercise and healthy diet     DVT ppx: on Xarelto  Code Status: Full code  Family Communication:   Yes, patient's nephew at bed side.    Disposition Plan:  Anticipate discharge back to previous environment  Consults called: Dr. Cristal Deerhristopher for cardiology  Admission status and Level of care: Progressive:     as inpt     Dispo: The patient is from: Home              Anticipated d/c is to: Home              Anticipated d/c date is: 2 days              Patient currently is not medically stable to d/c.    Severity of Illness:  The appropriate patient status for this patient is INPATIENT. Inpatient status is judged to be reasonable and necessary in order to provide the required intensity of service to ensure the patient's safety. The patient's presenting symptoms, physical exam findings, and initial radiographic and laboratory data in the context of their chronic comorbidities is felt to place them at high risk for further clinical deterioration. Furthermore, it is not anticipated that the patient will be medically stable for discharge from the hospital within 2 midnights of admission.   * I certify that at the point of admission it is my clinical judgment that the patient will require inpatient hospital  care spanning beyond 2 midnights from the point of admission due to high intensity of service, high risk for further deterioration and high frequency of surveillance required.*       Date of Service 08/24/2022    Lorretta HarpXilin Tushar Enns [facility]   If 7PM-7AM, please contact night-coverage [email or web address] 08/24/2022, 5:35 PM

## 2022-08-24 NOTE — ED Notes (Signed)
Pt converted back to NSR.

## 2022-08-25 ENCOUNTER — Other Ambulatory Visit (HOSPITAL_COMMUNITY): Payer: Self-pay

## 2022-08-25 DIAGNOSIS — I5033 Acute on chronic diastolic (congestive) heart failure: Secondary | ICD-10-CM | POA: Diagnosis not present

## 2022-08-25 DIAGNOSIS — I4891 Unspecified atrial fibrillation: Secondary | ICD-10-CM | POA: Diagnosis not present

## 2022-08-25 LAB — BASIC METABOLIC PANEL
Anion gap: 8 (ref 5–15)
BUN: 38 mg/dL — ABNORMAL HIGH (ref 6–20)
CO2: 26 mmol/L (ref 22–32)
Calcium: 8.6 mg/dL — ABNORMAL LOW (ref 8.9–10.3)
Chloride: 104 mmol/L (ref 98–111)
Creatinine, Ser: 1.49 mg/dL — ABNORMAL HIGH (ref 0.61–1.24)
GFR, Estimated: 54 mL/min — ABNORMAL LOW (ref >60)
Glucose, Bld: 178 mg/dL — ABNORMAL HIGH (ref 70–99)
Potassium: 3.7 mmol/L (ref 3.5–5.1)
Sodium: 138 mmol/L (ref 135–145)

## 2022-08-25 LAB — GLUCOSE, CAPILLARY
Glucose-Capillary: 159 mg/dL — ABNORMAL HIGH (ref 70–99)
Glucose-Capillary: 208 mg/dL — ABNORMAL HIGH (ref 70–99)
Glucose-Capillary: 183 mg/dL — ABNORMAL HIGH (ref 70–99)
Glucose-Capillary: 246 mg/dL — ABNORMAL HIGH (ref 70–99)

## 2022-08-25 LAB — MAGNESIUM: Magnesium: 2 mg/dL (ref 1.7–2.4)

## 2022-08-25 LAB — CBC
HCT: 29.7 % — ABNORMAL LOW (ref 39.0–52.0)
Hemoglobin: 9.3 g/dL — ABNORMAL LOW (ref 13.0–17.0)
MCH: 25.7 pg — ABNORMAL LOW (ref 26.0–34.0)
MCHC: 31.3 g/dL (ref 30.0–36.0)
MCV: 82 fL (ref 80.0–100.0)
Platelets: 291 10*3/uL (ref 150–400)
RBC: 3.62 MIL/uL — ABNORMAL LOW (ref 4.22–5.81)
RDW: 15.2 % (ref 11.5–15.5)
WBC: 9.1 10*3/uL (ref 4.0–10.5)
nRBC: 0 % (ref 0.0–0.2)

## 2022-08-25 MED ORDER — AMIODARONE HCL 200 MG PO TABS
400.0000 mg | ORAL_TABLET | Freq: Two times a day (BID) | ORAL | Status: DC
  Administered 2022-08-25 – 2022-08-26 (×3): 400 mg via ORAL
  Filled 2022-08-25 (×3): qty 2

## 2022-08-25 NOTE — Consult Note (Signed)
Cardiology Consultation   Patient ID: Craig Reese MRN: [id number]; DOB: 09/08/1960  Admit date: 08/24/2022 Date of Consult: 08/25/2022  PCP:  System, Provider Not In   [facility] Providers [facility]   Patient Profile:   Craig Reese is a 62 y.o. male with a hx of multiple cardiac medical conditions who is being seen 08/25/2022 for the evaluation of heart failure and atrial fibrillation at the request of Dr. Clyde LundborgNiu.  History of Present Illness:   Craig Reese is a 62 year old Spanish-speaking male.  History was obtained with the help of an interpreter. He has extensive cardiac history including bioprosthetic aortic valve replacement in 2015 for severe insufficiency in the setting of endocarditis with one-vessel CABG, chronic diastolic heart failure, persistent atrial fibrillation on long-term anticoagulation with Xarelto, type 2 diabetes and untreated sleep apnea.  He reports intolerance to CPAP. He has chronic atypical chest pain and underwent [facility] which showed unchanged anatomy from 2022 with moderate proximal disease in SVG graft to OM with moderate LAD and RCA disease.  Medical therapy was recommended. [facility] with atypical chest pain and shortness of breath in the setting of tachycardia and possible atrial fibrillation.  His troponin was mildly elevated and felt to be due to supply demand ischemia.  He was mildly hypotensive on presentation. He had an echocardiogram during that admission which showed normal LV systolic function, grade 2 diastolic dysfunction, replaced aortic valve with moderate stenosis and mean gradient of 30 mmHg.  The patient was treated medically.  Amiodarone was discontinued due to concerns about prolonged QT interval.  The patient has been on amiodarone for a while with no issues. He presented with shortness of breath and chest pain with palpitations.  He was noted to be  in atrial fibrillation with RVR and was hypotensive and mildly hypoxic.  Antihypertensive medications were held and he was given normal saline.  His blood pressure improved and he is feeling better.  He is back in sinus rhythm now.   Past Medical History:  Diagnosis Date   AF (paroxysmal atrial fibrillation) 06/06/2018   CAD (coronary artery disease) 05/13/2018   Chronic diastolic CHF (congestive heart failure) 11/17/2018   Coronary artery disease    Diabetes mellitus type II, non insulin dependent 11/17/2018   Hypothyroidism 11/17/2018    Past Surgical History:  Procedure Laterality Date   CORONARY ARTERY BYPASS GRAFT     LEFT HEART CATH AND CORS/GRAFTS ANGIOGRAPHY N/A 05/17/2018   Procedure: LEFT HEART CATH AND CORS/GRAFTS ANGIOGRAPHY and possible PCI and stent;  Surgeon: Alwyn Peaallwood, Dwayne D, MD;  Location: [facility] INVASIVE CV LAB;  Service: Cardiovascular;  Laterality: N/A;     Home Medications:  Prior to Admission medications   Medication Sig Start Date End Date Taking? Authorizing Provider  acetaminophen (TYLENOL) 500 MG tablet Take 500-1,000 mg by mouth every 6 (six) hours as needed for mild pain or fever.   Yes [provider]  atorvastatin (LIPITOR) 80 MG tablet Take 80 mg by mouth every evening.   Yes [provider]  canagliflozin (INVOKANA) 300 MG TABS tablet Take 300 mg by mouth in the morning.   Yes [provider]  chlorthalidone (HYGROTON) 25 MG tablet Take 25 mg by mouth in the morning.   Yes [provider]  furosemide (LASIX) 40 MG tablet Take 1 tablet (40 mg total) by mouth daily. 03/09/22  Yes Tresa MooreSreenath, Sudheer B, MD  gabapentin (NEURONTIN) 300  MG capsule Take 300 mg by mouth 2 (two) times daily.   Yes [provider]  hydrALAZINE (APRESOLINE) 50 MG tablet Take 100 mg by mouth 2 (two) times daily.   Yes [provider]  levothyroxine (SYNTHROID) 300 MCG tablet Take 300 mcg by mouth daily before breakfast. 05/10/22 05/10/23  Yes [provider]  losartan (COZAAR) 100 MG tablet Take 100 mg by mouth daily.   Yes [provider]  metFORMIN (GLUCOPHAGE) 1000 MG tablet Take 1,000 mg by mouth 2 (two) times daily with a meal.   Yes [provider]  metoprolol tartrate (LOPRESSOR) 50 MG tablet Take 1 tablet (50 mg total) by mouth 2 (two) times daily. 11/21/18  Yes Elgergawy, Leana Roe, MD  rivaroxaban (XARELTO) 20 MG TABS tablet Take 1 tablet (20 mg total) by mouth daily. Patient taking differently: Take 20 mg by mouth daily with supper. 04/08/20  Yes Alford Highland, MD  levothyroxine (SYNTHROID) 200 MCG tablet Take 1 tablet (200 mcg total) by mouth daily before breakfast. Patient not taking: Reported on 08/24/2022 03/10/22 04/09/22  Tresa Moore, MD  pantoprazole (PROTONIX) 20 MG tablet Take 1 tablet (20 mg total) by mouth 2 (two) times daily. 03/09/22 04/08/22  Tresa Moore, MD  spironolactone (ALDACTONE) 25 MG tablet Take 1 tablet (25 mg total) by mouth daily. Patient not taking: Reported on 08/24/2022 03/10/22 04/09/22  Tresa Moore, MD  sucralfate (CARAFATE) 1 GM/10ML suspension Take 1 g by mouth 4 (four) times daily. Patient not taking: Reported on 08/24/2022    [provider]    Inpatient Medications: Scheduled Meds:  amiodarone  400 mg Oral BID   atorvastatin  80 mg Oral QPM   gabapentin  300 mg Oral BID   insulin aspart  0-5 Units Subcutaneous QHS   insulin aspart  0-9 Units Subcutaneous TID WC   levothyroxine  300 mcg Oral QAC breakfast   pantoprazole  20 mg Oral BID   rivaroxaban  20 mg Oral Q supper   Continuous Infusions:  PRN Meds: acetaminophen, albuterol, dextromethorphan-guaiFENesin, diphenhydrAMINE, morphine injection  Allergies:   No Known Allergies  Social History:   Social History   Socioeconomic History   Marital status: Single    Spouse name: Not on file   Number of children: Not on file   Years of education: Not on file   Highest  education level: Not on file  Occupational History   Not on file  Tobacco Use   Smoking status: Never   Smokeless tobacco: Never  Vaping Use   Vaping Use: Never used  Substance and Sexual Activity   Alcohol use: Not Currently   Drug use: Never   Sexual activity: Not Currently  Other Topics Concern   Not on file  Social History Narrative   Not on file   Social Determinants of Health   Financial Resource Strain: Medium Risk (05/13/2018)   Overall Financial Resource Strain (CARDIA)    Difficulty of Paying Living Expenses: Somewhat hard  Food Insecurity: Unknown (05/13/2018)   Hunger Vital Sign    Worried About Running Out of Food in the Last Year: Patient declined    Ran Out of Food in the Last Year: Patient declined  Transportation Needs: Unknown (05/13/2018)   PRAPARE - Transportation    Lack of Transportation (Medical): Patient declined    Lack of Transportation (Non-Medical): Patient declined  Physical Activity: Unknown (05/13/2018)   Exercise Vital Sign    Days of Exercise per Week: Patient  declined    Minutes of Exercise per Session: Patient declined  Stress: Stress Concern Present (05/13/2018)   [employer] of Occupational Health - Occupational Stress Questionnaire    Feeling of Stress : To some extent  Social Connections: Not on file  Intimate Partner Violence: Not on file    Family History:    Family History  Problem Relation Age of Onset   Heart disease Brother      ROS:  Please see the history of present illness.   All other ROS reviewed and negative.     Physical Exam/Data:   Vitals:   08/24/22 2347 08/25/22 0335 08/25/22 0731 08/25/22 0847  BP: 130/77 112/72 134/74   Pulse: 72 65 65   Resp: 11 20 18    Temp: 98.8 F (37.1 C) 98 F (36.7 C) (!) 97.5 F (36.4 C)   TempSrc: Oral Oral    SpO2: 97% 98% 96%   Weight:    121.4 kg  Height:        Intake/Output Summary (Last 24 hours) at 08/25/2022 1157 Last data filed at 08/25/2022 1045 Gross  per 24 hour  Intake 240 ml  Output --  Net 240 ml      08/25/2022    8:47 AM 08/24/2022    8:22 AM 08/20/2022    1:17 PM  Last 3 Weights  Weight (lbs) 267 lb 10.2 oz 260 lb 264 lb  Weight (kg) 121.4 kg 117.935 kg 119.75 kg     Body mass index is 38.4 kg/m.  General:  Well nourished, well developed, in no acute distress HEENT: normal Neck: no JVD Vascular: No carotid bruits; Distal pulses 2+ bilaterally Cardiac:  normal S1, S2; RRR; 3 out of 6 systolic murmur in the aortic area which is mid peaking Lungs:  clear to auscultation bilaterally, no wheezing, rhonchi or rales  Abd: soft, nontender, no hepatomegaly  Ext: no edema Musculoskeletal:  No deformities, BUE and BLE strength normal and equal Skin: warm and dry  Neuro:  CNs 2-12 intact, no focal abnormalities noted Psych:  Normal affect   EKG:  The EKG was personally reviewed and demonstrates: Atrial fibrillation with RVR with right bundle branch block Telemetry:  Telemetry was personally reviewed and demonstrates: Normal sinus rhythm  Relevant CV Studies:   Laboratory Data:  High Sensitivity Troponin:   Recent Labs  Lab 08/24/22 0826 08/24/22 1215 08/24/22 1500 08/24/22 1734 08/24/22 2029  TROPONINIHS 40* 65* 82* 91* 87*     Chemistry Recent Labs  Lab 08/20/22 1319 08/21/22 0527 08/24/22 0826 08/24/22 1100 08/25/22 0627  NA 137  --  136  --  138  K 2.9*  --  3.3*  --  3.7  CL 100  --  99  --  104  CO2 25  --  25  --  26  GLUCOSE 222*  --  226*  --  178*  BUN 44*  --  39*  --  38*  CREATININE 1.56*  --  1.84*  --  1.49*  CALCIUM 9.5  --  9.0  --  8.6*  MG 2.2 2.1  --  1.8 2.0  GFRNONAA 51*  --  42*  --  54*  ANIONGAP 12  --  12  --  8    Recent Labs  Lab 08/20/22 1319 08/24/22 0826  PROT 8.4* 8.2*  ALBUMIN 4.0 3.9  AST 32 20  ALT 28 20  ALKPHOS 74 93  BILITOT 0.6 0.5   Lipids  Recent  Labs  Lab 08/22/22 0444  CHOL 114  TRIG 180*  HDL 24*  LDLCALC 54  CHOLHDL 4.8    Hematology Recent  Labs  Lab 08/20/22 1319 08/24/22 0826 08/25/22 0627  WBC 8.7 11.3* 9.1  RBC 4.25 4.33 3.62*  HGB 10.8* 10.9* 9.3*  HCT 34.0* 35.5* 29.7*  MCV 80.0 82.0 82.0  MCH 25.4* 25.2* 25.7*  MCHC 31.8 30.7 31.3  RDW 14.7 15.1 15.2  PLT 352 356 291   Thyroid  Recent Labs  Lab 08/21/22 0532  TSH 1.492    BNP Recent Labs  Lab 08/24/22 0826  BNP 236.2*    DDimer No results for input(s): "DDIMER" in the last 168 hours.   Radiology/Studies:  CT Angio Chest PE W and/or Wo Contrast  Result Date: 08/24/2022 CLINICAL DATA:  Acute onset chest pain and tachycardia. Hypoxia. High probability for pulmonary embolism. EXAM: CT ANGIOGRAPHY CHEST WITH CONTRAST TECHNIQUE: Multidetector CT imaging of the chest was performed using the standard protocol during bolus administration of intravenous contrast. Multiplanar CT image reconstructions and MIPs were obtained to evaluate the vascular anatomy. RADIATION DOSE REDUCTION: This exam was performed according to the departmental dose-optimization program which includes automated exposure control, adjustment of the mA and/or kV according to patient size and/or use of iterative reconstruction technique. CONTRAST:  92mL OMNIPAQUE IOHEXOL 350 MG/ML SOLN COMPARISON:  04/07/2020 FINDINGS: Cardiovascular: Opacification of pulmonary arteries is suboptimal, however no pulmonary emboli are identified. No evidence of thoracic aortic dissection. 4.1 cm thoracic aortic aneurysm remains stable. Prior CABG again noted. Stable moderate cardiomegaly and left ventricular hypertrophy. Mediastinum/Nodes: No masses or pathologically enlarged lymph nodes identified. Lungs/Pleura: No pulmonary mass, infiltrate, or effusion. Upper abdomen: Small calculus noted in lower pole of left kidney. No evidence of hydronephrosis. Musculoskeletal: No suspicious bone lesions identified. Review of the MIP images confirms the above findings. IMPRESSION: Suboptimal opacification of pulmonary arteries,  however no pulmonary embolism identified. No other acute findings. Stable 4.1 cm thoracic aortic aneurysm. Recommend annual imaging followup by CTA or MRA. This recommendation follows 2010 ACCF/AHA/AATS/ACR/ASA/SCA/SCAI/SIR/STS/SVM Guidelines for the Diagnosis and Management of Patients with Thoracic Aortic Disease. Circulation. 2010; 121: D314-H888. Aortic aneurysm NOS (ICD10-I71.9) Nonobstructing left renal calculus. Aortic Atherosclerosis (ICD10-I70.0). Electronically Signed   By: Danae Orleans M.D.   On: 08/24/2022 10:22   DG Chest Port 1 View  Result Date: 08/24/2022 CLINICAL DATA:  Shortness of breath EXAM: PORTABLE CHEST 1 VIEW COMPARISON:  08/20/2022 FINDINGS: Stable cardiomegaly. Prior sternotomy. Aortic atherosclerosis. Pulmonary vascular congestion. Mild bilateral interstitial prominence. No lobar consolidation. No pleural effusion or pneumothorax. IMPRESSION: Findings suggestive of CHF with mild interstitial edema. Electronically Signed   By: Duanne Guess D.O.   On: 08/24/2022 09:15   ECHOCARDIOGRAM COMPLETE  Result Date: 08/21/2022    ECHOCARDIOGRAM REPORT   Patient Name:   GARYN DEBRUYNE Date of Exam: 08/21/2022 Medical Rec #:  [id number]    Height:       70.0 in Accession #:    [id number]   Weight:       264.0 lb Date of Birth:  1960/06/29    BSA:          2.349 m Patient Age:    61 years     BP:           124/72 mmHg Patient Gender: M            HR:           64 bpm. Exam Location:  [facility] Procedure: 2D  Echo, Cardiac Doppler, Color Doppler and Intracardiac            Opacification Agent Indications:     Chest Pain  History:         Patient has prior history of Echocardiogram examinations, most                  recent 03/07/2022. CHF, CAD and Previous Myocardial Infarction,                  Arrythmias:Atrial Fibrillation, Signs/Symptoms:Chest Pain and                  Dyspnea; Risk Factors:Hypertension, Diabetes, Dyslipidemia and                  Sleep Apnea.  Sonographer:     Mikki Harbor  Referring Phys:  [id number] CADENCE H FURTH Diagnosing Phys: Lorine Bears MD  Sonographer Comments: Technically difficult study due to poor echo windows, no subcostal window and patient is obese. Image acquisition challenging due to patient body habitus. IMPRESSIONS  1. Left ventricular ejection fraction, by estimation, is 55 to 60%. The left ventricle has normal function. The left ventricle has no regional wall motion abnormalities. There is severe left ventricular hypertrophy. Left ventricular diastolic parameters  are consistent with Grade II diastolic dysfunction (pseudonormalization).  2. Right ventricular systolic function is mildly reduced. The right ventricular size is normal. There is normal pulmonary artery systolic pressure.  3. Left atrial size was severely dilated.  4. Right atrial size was moderately dilated.  5. The mitral valve is normal in structure. Mild mitral valve regurgitation. No evidence of mitral stenosis.  6. The aortic valve has been repaired/replaced. Aortic valve regurgitation is not visualized. Moderate aortic valve stenosis. Aortic valve area, by VTI measures 1.24 cm. Aortic valve mean gradient measures 29.5 mmHg.  7. Aortic dilatation noted. There is mild dilatation of the ascending aorta, measuring 40 mm.  8. The inferior vena cava is normal in size with <50% respiratory variability, suggesting right atrial pressure of 8 mmHg. FINDINGS  Left Ventricle: Left ventricular ejection fraction, by estimation, is 55 to 60%. The left ventricle has normal function. The left ventricle has no regional wall motion abnormalities. Definity contrast agent was given IV to delineate the left ventricular  endocardial borders. The left ventricular internal cavity size was normal in size. There is severe left ventricular hypertrophy. Left ventricular diastolic parameters are consistent with Grade II diastolic dysfunction (pseudonormalization). Right Ventricle: The right ventricular size is normal. No  increase in right ventricular wall thickness. Right ventricular systolic function is mildly reduced. There is normal pulmonary artery systolic pressure. The tricuspid regurgitant velocity is 1.88 m/s, and with an assumed right atrial pressure of 8 mmHg, the estimated right ventricular systolic pressure is 22.1 mmHg. Left Atrium: Left atrial size was severely dilated. Right Atrium: Right atrial size was moderately dilated. Pericardium: There is no evidence of pericardial effusion. Mitral Valve: The mitral valve is normal in structure. There is mild thickening of the mitral valve leaflet(s). Mild mitral valve regurgitation. No evidence of mitral valve stenosis. MV peak gradient, 5.9 mmHg. The mean mitral valve gradient is 2.0 mmHg. Tricuspid Valve: The tricuspid valve is normal in structure. Tricuspid valve regurgitation is trivial. No evidence of tricuspid stenosis. Aortic Valve: The aortic valve has been repaired/replaced. Aortic valve regurgitation is not visualized. Moderate aortic stenosis is present. Aortic valve mean gradient measures 29.5 mmHg. Aortic valve peak gradient measures 48.7 mmHg. Aortic valve  area,  by VTI measures 1.24 cm. There is a bioprosthetic valve present in the aortic position. Pulmonic Valve: The pulmonic valve was normal in structure. Pulmonic valve regurgitation is mild. No evidence of pulmonic stenosis. Aorta: Aortic dilatation noted. There is mild dilatation of the ascending aorta, measuring 40 mm. Venous: The inferior vena cava is normal in size with less than 50% respiratory variability, suggesting right atrial pressure of 8 mmHg. IAS/Shunts: No atrial level shunt detected by color flow Doppler.  LEFT VENTRICLE PLAX 2D LVIDd:         4.20 cm   Diastology LVIDs:         2.80 cm   LV e' medial:    4.78 cm/s LV PW:         2.00 cm   LV E/e' medial:  20.9 LV IVS:        2.00 cm   LV e' lateral:   7.15 cm/s LVOT diam:     2.00 cm   LV E/e' lateral: 14.0 LV SV:         100 LV SV Index:    43 LVOT Area:     3.14 cm  RIGHT VENTRICLE RV Basal diam:  5.30 cm LEFT ATRIUM              Index        RIGHT ATRIUM           Index LA diam:        6.10 cm  2.60 cm/m   RA Area:     25.90 cm LA Vol (A2C):   103.0 ml 43.85 ml/m  RA Volume:   88.40 ml  37.64 ml/m LA Vol (A4C):   119.0 ml 50.67 ml/m LA Biplane Vol: 117.0 ml 49.81 ml/m  AORTIC VALVE                     PULMONIC VALVE AV Area (Vmax):    1.14 cm      PV Vmax:       1.20 m/s AV Area (Vmean):   1.05 cm      PV Peak grad:  5.8 mmHg AV Area (VTI):     1.24 cm AV Vmax:           349.00 cm/s AV Vmean:          251.500 cm/s AV VTI:            0.805 m AV Peak Grad:      48.7 mmHg AV Mean Grad:      29.5 mmHg LVOT Vmax:         127.00 cm/s LVOT Vmean:        84.300 cm/s LVOT VTI:          0.318 m LVOT/AV VTI ratio: 0.40  AORTA Ao Root diam: 3.50 cm Ao Asc diam:  4.00 cm MITRAL VALVE                TRICUSPID VALVE MV Area (PHT): 2.73 cm     TR Peak grad:   14.1 mmHg MV Area VTI:   2.65 cm     TR Vmax:        188.00 cm/s MV Peak grad:  5.9 mmHg MV Mean grad:  2.0 mmHg     SHUNTS MV Vmax:       1.21 m/s     Systemic VTI:  0.32 m MV Vmean:      68.9 cm/s  Systemic Diam: 2.00 cm MV Decel Time: 278 msec MV E velocity: 100.00 cm/s MV A velocity: 98.50 cm/s MV E/A ratio:  1.02 Lorine Bears MD Electronically signed by Lorine Bears MD Signature Date/Time: 08/21/2022/3:55:45 PM    Final      Assessment and Plan:   Atrial fibrillation with RVR: I suspect that this is the likely culprit for acute decompensation and hypotension in the setting of severe LVH and moderate aortic stenosis.  Amiodarone was discontinued recently during recent admission due to concerns about prolonged QT interval.  However, I recommend resuming the medication with a loading dose of 400 mg twice daily for 5 days followed by 200 mg twice daily for a week and then 200 mg once daily after.  Due to severe left ventricular hypertrophy and chronic diastolic heart failure,  antiarrhythmic medications are limited to amiodarone and dofetilide.  Dofetilide is not a good option in the setting of underlying chronic kidney disease and prolonged QT interval.  I favor amiodarone given that he tolerated this medication in the past very well even in the setting of prolonged QT interval.  Ultimately, he will need to be evaluated by EP to consider A-fib ablation or AV nodal ablation with pacemaker placement.  Continue anticoagulation. Status post bioprosthetic aortic valve replacement: Recent echocardiogram showed moderate aortic stenosis and this will have to be monitored with a repeat echocardiogram in 6 months. Atypical chest pain: Mildly elevated troponin not consistent with acute coronary syndrome and remains relatively flat.  Recent cardiac catheterization at [facility] showed no obstructive disease.  Continue medical therapy.  No plans for ischemic cardiac evaluation. Acute on chronic diastolic heart failure: Some decompensation likely related to A-fib with RVR.  Now that he is in sinus rhythm, he appears to be euvolemic. Essential hypertension: Blood pressure medications are currently on hold due to hypotension on presentation.  Recommend permissive hypertension in the setting of recurrent episodes of hypotension.  As an outpatient, he was on chlorthalidone, hydralazine, losartan and metoprolol in addition to spironolactone.  I favor not resuming hydralazine and chlorthalidone and resuming the rest of his medications once blood pressure is improved. Obstructive sleep apnea: He has known history of severe sleep apnea but reports intolerance to CPAP.  I suspect that this is significantly contributing to A-fib with RVR and also recurrent hypoxic events.    For questions or updates, please contact [facility] Please consult [email or web address] for contact info under    Signed, Lorine Bears, MD  08/25/2022 11:57 AM

## 2022-08-25 NOTE — Hospital Course (Addendum)
Craig Reese is a 62 y.o. male with medical history significant of dCHF, CAD, s/p of CABG, bioprosthetic aortic valve replacement in 2015 for severe insufficiency in the setting of endocarditis, HTN, HLD, DM, OSA not on CPAP d/t mask intolerance, hypothyroidism, CKD-3A, Afib on Xarelto Of note on left heart cath done at [facility] on 07/31/2022, recommended medical management and possible etiology of his chronic chest pain and shortness of breath is related to obstructive sleep apnea.  Recent hospitalization 04/03-04/09/2022. Patient has had multiple hospital evaluation and admissions concerning chest pain and shortness of breath, differentials include untreated sleep apnea, anginal pain, medication noncompliance in setting of decreased understanding complicated by [place] language deficiency.  2D echo on 08/21/2022 showed EF of 55 to 60% with grade 2 diastolic dysfunction.   Hospital course this admission: he presents with chest pain and shortness of breath. 04/07: In ED desaturation to 86% on room air, initially required nonrebreather, then weaned off to room and 97% on RA (does not have home O2 requirement). Patient was initially hypotensive with blood pressure 75/57, which improved to 100/68 after giving 1 L normal saline in ED. He has not taken his medications for more than a month, but he seems to be taking Xarelto. Troponin 40 -->  65, BNP 236, negative COVID PCR, worsening renal function, potassium 3.3, temperature normal, heart rate 37 --> 115 --> 70, RR 28 --> 15.  Chest x-ray showed mild interstitial edema.  CTA negative for PE.  Patient admitted to PCU as inpatient.  Dr. Cristal Deer of cardiology consulted.  04/08: cardiology to restart amiodarone, continue monitoring. Suspect Afib RVR likely culprit for acute decompensation and hypotension in the setting of severe LVH and moderate aortic stenosis. Will need eventual EP eval. Lengthy discussion at bedside today w/ patient educating on his  disease processes. Interpreter used.  04/09: Per cardiology can discharge on 200 mg amiodarone bid x1 week then back to 200 mg, confirmed w/ pharmacy pt has sufficient supply at home and on the way refill from pharmacy at [facility]. Will need EP f/u at [facility]. Has appt w/ primary cardiologist 04/17, he is instructed to keep this appointment.    Consultants:  Cardiology   Procedures: none      ASSESSMENT & PLAN:   Principal Problem:   Acute on chronic diastolic congestive heart failure Active Problems:   CAD (coronary artery disease)   Chest pain   Atrial fibrillation with RVR   Hypotension   HLD (hyperlipidemia)   HTN (hypertension)   CKD stage 3a, GFR 45-59 ml/min   Hypokalemia   Hypothyroidism   Severe obstructive sleep apnea   Obesity (BMI 30-39.9)  Acute hypoxic respiratory failure - resolved   Acute on chronic diastolic congestive heart failure:  Low salt diet Lasix *** Follow w/ cardiology outpatient    CAD (coronary artery disease) and chest pain:  trop 40 --> 65, possibly due to demand ischemia. Lipitor Defer BB and ACE/ARB d/t low BP will not give nitroglycerin due to hypotension No plans for ischemic eval  Follow w/ cardiology outpatient    Atrial fibrillation with RVR Continue Xarelto Hold metoprolol due to hypotension Restarting amiodarone EP eval outpatient    Bioprosthetic valve Echo q6 mo  Hypotension Hx essential hypertension:  Etiology is not clear. Infection r/o    Hold all blood pressure medications and diuretics (Lasix, spironolactone, hydralazine, Cozaar, metoprolol, hypertension) Recommend permissive hypertension in the setting of recurrent episodes of hypotension.  Per cardiology, would NOT resume hydralazine or  chlorthalidone   OSA Nonadherent to CPAP d/t mask intolerance This is likely contributing to cardiac stress, Afib RVR, hypoxia    HLD (hyperlipidemia) Lipitor   AKI on CKD stage 3a, GFR 45-59 ml/min - resolved Slightly  worsened than baseline.  Recent baseline creatinine 1.56 point 08/20/2022.  His creatinine is 1.84, BUN 39, GFR 42 Follow-up by BMP Hold diuretics Patient received 1 L normal saline, repeat fluids as needed but caution given G2DD on recent Echo   Hypokalemia:  Potassium 3.3.  Magnesium 1.8 Repeat potassium   Hypothyroidism Recent TSH WNL Conitnue Synthroid   Severe obstructive sleep apnea Patient is not using CPAP   Obesity (BMI 30-39.9):  Body weight 117.9 kg, BMI 37.31 Encouraged losing weight Exercise and healthy diet       DVT prophylaxis: Xarelto Pertinent IV fluids/nutrition: no continuous IV fluids  Central lines / invasive devices: none  Code Status: FULL CODE  Current Admission Status: inpatient   TOC needs / Dispo plan: anticipate back to previous home environment  Barriers to discharge / significant pending items: cardiology recs ***

## 2022-08-25 NOTE — Progress Notes (Signed)
PROGRESS NOTE    Craig Reese   ZOX:[id number] DOB: [birth year]  DOA: 08/24/2022 Date of Service: 08/25/22 PCP: System, Provider Not In     Brief Narrative / Hospital Course:  Craig Reese is a 62 y.o. male with medical history significant of dCHF, CAD, s/p of CABG, bioprosthetic aortic valve replacement in 2015 for severe insufficiency in the setting of endocarditis, HTN, HLD, DM, OSA not on CPAP d/t mask intolerance, hypothyroidism, CKD-3A, Afib on Xarelto Of note on left heart cath done at [facility] on 07/31/2022, recommended medical management and possible etiology of his chronic chest pain and shortness of breath is related to obstructive sleep apnea.  Recent hospitalization 04/03-04/09/2022. Patient has had multiple hospital evaluation and admissions concerning chest pain and shortness of breath, differentials include untreated sleep apnea, anginal pain, medication noncompliance in setting of decreased understanding complicated by [place] language deficiency.  2D echo on 08/21/2022 showed EF of 55 to 60% with grade 2 diastolic dysfunction.   Hospital course this admission: he presents with chest pain and shortness of breath. 04/07: In ED desaturation to 86% on room air, initially required nonrebreather, then weaned off to room and 97% on RA (does not have home O2 requirement). Patient was initially hypotensive with blood pressure 75/57, which improved to 100/68 after giving 1 L normal saline in ED. He has not taken his medications for more than a month, but he seems to be taking Xarelto. Troponin 40 -->  65, BNP 236, negative COVID PCR, worsening renal function, potassium 3.3, temperature normal, heart rate 37 --> 115 --> 70, RR 28 --> 15.  Chest x-ray showed mild interstitial edema.  CTA negative for PE.  Patient admitted to PCU as inpatient.  Dr. Cristal Deer of cardiology consulted.  04/08: cardiology to restart amiodarone, continue monitoring. Suspect Afib RVR likely culprit for acute  decompensation and hypotension in the setting of severe LVH and moderate aortic stenosis. Eventual EP eval. Lengthy discussion at bedside today w/ patient educating on his disease processes.   Consultants:  Cardiology   Procedures: none      ASSESSMENT & PLAN:   Principal Problem:   Acute on chronic diastolic congestive heart failure Active Problems:   CAD (coronary artery disease)   Chest pain   Atrial fibrillation with RVR   Hypotension   HLD (hyperlipidemia)   HTN (hypertension)   CKD stage 3a, GFR 45-59 ml/min   Hypokalemia   Hypothyroidism   Severe obstructive sleep apnea   Obesity (BMI 30-39.9)  Acute hypoxic respiratory failure - resolved   Acute on chronic diastolic congestive heart failure:  Since patient w/ hypotension, admitting physician deferred IV Lasix since patient has normal saturation on room air.   Daily weights strict I/O's Low salt diet Fluid restriction Obtain REDs Vest reading Lasix per cardiology, holding for now    CAD (coronary artery disease) and chest pain:  trop 40 --> 65, possibly due to demand ischemia. Will not start aspirin since patient is on Xarelto.  Lipitor Trend troponin Check A1c  prn morphine chest pain will not give nitroglycerin due to hypotension No plans for ischemic eval    Atrial fibrillation with RVR Continue Xarelto Hold metoprolol due to hypotension Restarting amiodarone EP eval outpatient    Bioprosthetic valve Echo q6 mo  Hypotension Hx essential hypertension:  Etiology is not clear.   Blood pressure responded to IV fluid.   Patient has a mild leukocytosis with WBC 11.4, but no source of infection identified.  No fever.  Follow-up urinalysis and blood culture Hold all blood pressure medications and diuretics (Lasix, spironolactone, hydralazine, Cozaar, metoprolol, hypertension) Recommend permissive hypertension in the setting of recurrent episodes of hypotension.  Per cardiology, would NOT resume  hydralazine or chlorthalidone   OSA Nonadherent to CPAP d/t mask intolerance This is likely contributing to cardiac stress, Afib RVR, hypoxia    HLD (hyperlipidemia) Lipitor   AKI on CKD stage 3a, GFR 45-59 ml/min - resolved Slightly worsened than baseline.  Recent baseline creatinine 1.56 point 08/20/2022.  His creatinine is 1.84, BUN 39, GFR 42 Follow-up by BMP Hold diuretics Patient received 1 L normal saline, repeat fluids as needed but caution given G2DD on recent Echo   Hypokalemia:  Potassium 3.3.  Magnesium 1.8 Repeat potassium   Hypothyroidism Recent TSH WNL Conitnue Synthroid   Severe obstructive sleep apnea Patient is not using CPAP   Obesity (BMI 30-39.9):  Body weight 117.9 kg, BMI 37.31 Encouraged losing weight Exercise and healthy diet       DVT prophylaxis: Xarelto Pertinent IV fluids/nutrition: no continuous IV fluids  Central lines / invasive devices: none  Code Status: FULL CODE  Current Admission Status: inpatient   TOC needs / Dispo plan: anticipate back to previous home environment  Barriers to discharge / significant pending items: cardiology recs for monitor overnight since restarting meds              Subjective / Brief ROS:  Patient reports intermittent chest pain, occasional twinges/stabs but these are brief, no SOB Pain controlled.  Denies new weakness.  Tolerating diet.  Reports no concerns w/ urination/defecation.   Family Communication: none at this time     Objective Findings:  Vitals:   08/25/22 0731 08/25/22 0847 08/25/22 1219 08/25/22 1526  BP: 134/74  (!) 140/75 126/81  Pulse: 65  74 77  Resp: 18  18 18   Temp: (!) 97.5 F (36.4 C)  98.3 F (36.8 C) 97.6 F (36.4 C)  TempSrc:      SpO2: 96%  97% 97%  Weight:  121.4 kg    Height:        Intake/Output Summary (Last 24 hours) at 08/25/2022 1657 Last data filed at 08/25/2022 1434 Gross per 24 hour  Intake 480 ml  Output --  Net 480 ml   Filed Weights    08/24/22 0822 08/25/22 0847  Weight: 117.9 kg 121.4 kg    Examination:  Physical Exam Constitutional:      General: He is not in acute distress.    Appearance: He is obese.  Cardiovascular:     Rate and Rhythm: Normal rate and regular rhythm.     Heart sounds: Murmur heard.     Systolic murmur is present.  Pulmonary:     Effort: Pulmonary effort is normal. No respiratory distress.     Breath sounds: Normal breath sounds.  Abdominal:     Palpations: Abdomen is soft.  Musculoskeletal:     Right lower leg: No edema.     Left lower leg: No edema.  Neurological:     General: No focal deficit present.     Mental Status: He is alert and oriented to person, place, and time.  Psychiatric:        Mood and Affect: Mood normal.        Behavior: Behavior normal.          Scheduled Medications:   amiodarone  400 mg Oral BID   atorvastatin  80 mg Oral QPM  gabapentin  300 mg Oral BID   insulin aspart  0-5 Units Subcutaneous QHS   insulin aspart  0-9 Units Subcutaneous TID WC   levothyroxine  300 mcg Oral QAC breakfast   pantoprazole  20 mg Oral BID   rivaroxaban  20 mg Oral Q supper    Continuous Infusions:   PRN Medications:  acetaminophen, albuterol, dextromethorphan-guaiFENesin, diphenhydrAMINE, morphine injection  Antimicrobials from admission:  Anti-infectives (From admission, onward)    None           Data Reviewed:  I have personally reviewed the following...  CBC: Recent Labs  Lab 08/20/22 1319 08/24/22 0826 08/25/22 0627  WBC 8.7 11.3* 9.1  NEUTROABS  --  6.7  --   HGB 10.8* 10.9* 9.3*  HCT 34.0* 35.5* 29.7*  MCV 80.0 82.0 82.0  PLT 352 356 291   Basic Metabolic Panel: Recent Labs  Lab 08/20/22 1319 08/21/22 0527 08/24/22 0826 08/24/22 1100 08/25/22 0627  NA 137  --  136  --  138  K 2.9*  --  3.3*  --  3.7  CL 100  --  99  --  104  CO2 25  --  25  --  26  GLUCOSE 222*  --  226*  --  178*  BUN 44*  --  39*  --  38*  CREATININE  1.56*  --  1.84*  --  1.49*  CALCIUM 9.5  --  9.0  --  8.6*  MG 2.2 2.1  --  1.8 2.0   GFR: Estimated Creatinine Clearance: 69.8 mL/min (A) (by C-G formula based on SCr of 1.49 mg/dL (H)). Liver Function Tests: Recent Labs  Lab 08/20/22 1319 08/24/22 0826  AST 32 20  ALT 28 20  ALKPHOS 74 93  BILITOT 0.6 0.5  PROT 8.4* 8.2*  ALBUMIN 4.0 3.9   No results for input(s): "LIPASE", "AMYLASE" in the last 168 hours. No results for input(s): "AMMONIA" in the last 168 hours. Coagulation Profile: No results for input(s): "INR", "PROTIME" in the last 168 hours. Cardiac Enzymes: No results for input(s): "CKTOTAL", "CKMB", "CKMBINDEX", "TROPONINI" in the last 168 hours. BNP (last 3 results) No results for input(s): "PROBNP" in the last 8760 hours. HbA1C: No results for input(s): "HGBA1C" in the last 72 hours. CBG: Recent Labs  Lab 08/24/22 1635 08/24/22 2035 08/25/22 0925 08/25/22 1219 08/25/22 1526  GLUCAP 165* 179* 159* 208* 183*   Lipid Profile: No results for input(s): "CHOL", "HDL", "LDLCALC", "TRIG", "CHOLHDL", "LDLDIRECT" in the last 72 hours. Thyroid Function Tests: No results for input(s): "TSH", "T4TOTAL", "FREET4", "T3FREE", "THYROIDAB" in the last 72 hours. Anemia Panel: No results for input(s): "VITAMINB12", "FOLATE", "FERRITIN", "TIBC", "IRON", "RETICCTPCT" in the last 72 hours. Most Recent Urinalysis On File:     Component Value Date/Time   COLORURINE Yellow 09/22/2013 2248   APPEARANCEUR Clear 09/22/2013 2248   LABSPEC >1.060 09/22/2013 2248   PHURINE 5.0 09/22/2013 2248   GLUCOSEU Negative 09/22/2013 2248   HGBUR 1+ 09/22/2013 2248   BILIRUBINUR Negative 09/22/2013 2248   KETONESUR Negative 09/22/2013 2248   PROTEINUR Negative 09/22/2013 2248   NITRITE Negative 09/22/2013 2248   LEUKOCYTESUR Negative 09/22/2013 2248   Sepsis Labs: @LABRCNTIP (procalcitonin:4,lacticidven:4) Microbiology: Recent Results (from the past 240 hour(s))  Resp panel by RT-PCR  (RSV, Flu A&B, Covid) Anterior Nasal Swab     Status: None   Collection Time: 08/24/22  9:18 AM   Specimen: Anterior Nasal Swab  Result Value Ref Range Status   SARS  Coronavirus 2 by RT PCR NEGATIVE NEGATIVE Final    Comment: (NOTE) SARS-CoV-2 target nucleic acids are NOT DETECTED.  The SARS-CoV-2 RNA is generally detectable in upper respiratory specimens during the acute phase of infection. The lowest concentration of SARS-CoV-2 viral copies this assay can detect is 138 copies/mL. A negative result does not preclude SARS-Cov-2 infection and should not be used as the sole basis for treatment or other patient management decisions. A negative result may occur with  improper specimen collection/handling, submission of specimen other than nasopharyngeal swab, presence of viral mutation(s) within the areas targeted by this assay, and inadequate number of viral copies(<138 copies/mL). A negative result must be combined with clinical observations, patient history, and epidemiological information. The expected result is Negative.  Fact Sheet for Patients:  BloggerCourse.com  Fact Sheet for Healthcare Providers:  SeriousBroker.it  This test is no t yet approved or cleared by the [place] FDA and  has been authorized for detection and/or diagnosis of SARS-CoV-2 by FDA under an Emergency Use Authorization (EUA). This EUA will remain  in effect (meaning this test can be used) for the duration of the COVID-19 declaration under Section 564(b)(1) of the Act, 21 U.S.C.section 360bbb-3(b)(1), unless the authorization is terminated  or revoked sooner.       Influenza A by PCR NEGATIVE NEGATIVE Final   Influenza B by PCR NEGATIVE NEGATIVE Final    Comment: (NOTE) The Xpert Xpress SARS-CoV-2/FLU/RSV plus assay is intended as an aid in the diagnosis of influenza from Nasopharyngeal swab specimens and should not be used as a sole basis for  treatment. Nasal washings and aspirates are unacceptable for Xpert Xpress SARS-CoV-2/FLU/RSV testing.  Fact Sheet for Patients: BloggerCourse.com  Fact Sheet for Healthcare Providers: SeriousBroker.it  This test is not yet approved or cleared by the [place] FDA and has been authorized for detection and/or diagnosis of SARS-CoV-2 by FDA under an Emergency Use Authorization (EUA). This EUA will remain in effect (meaning this test can be used) for the duration of the COVID-19 declaration under Section 564(b)(1) of the Act, 21 U.S.C. section 360bbb-3(b)(1), unless the authorization is terminated or revoked.     Resp Syncytial Virus by PCR NEGATIVE NEGATIVE Final    Comment: (NOTE) Fact Sheet for Patients: BloggerCourse.com  Fact Sheet for Healthcare Providers: SeriousBroker.it  This test is not yet approved or cleared by the [place] FDA and has been authorized for detection and/or diagnosis of SARS-CoV-2 by FDA under an Emergency Use Authorization (EUA). This EUA will remain in effect (meaning this test can be used) for the duration of the COVID-19 declaration under Section 564(b)(1) of the Act, 21 U.S.C. section 360bbb-3(b)(1), unless the authorization is terminated or revoked.  Performed at [facility], [address] [place], [place] [address]   Culture, blood (Routine X 2) w Reflex to ID Panel     Status: None (Preliminary result)   Collection Time: 08/24/22 11:22 AM   Specimen: BLOOD LEFT HAND  Result Value Ref Range Status   Specimen Description BLOOD LEFT HAND  Final   Special Requests   Final    BOTTLES DRAWN AEROBIC AND ANAEROBIC Blood Culture adequate volume   Culture   Final    NO GROWTH < 24 HOURS Performed at [facility], [address] [place], [place] [address]    Report Status PENDING  Incomplete  Culture, blood (Routine X  2) w Reflex to ID Panel     Status: None (Preliminary result)  Collection Time: 08/24/22 11:22 AM   Specimen: BLOOD RIGHT HAND  Result Value Ref Range Status   Specimen Description BLOOD RIGHT HAND  Final   Special Requests   Final    BOTTLES DRAWN AEROBIC AND ANAEROBIC Blood Culture adequate volume   Culture   Final    NO GROWTH < 24 HOURS Performed at [facility], [address] [place], [place] [address]    Report Status PENDING  Incomplete      Radiology Studies last 3 days: CT Angio Chest PE W and/or Wo Contrast  Result Date: 08/24/2022 CLINICAL DATA:  Acute onset chest pain and tachycardia. Hypoxia. High probability for pulmonary embolism. EXAM: CT ANGIOGRAPHY CHEST WITH CONTRAST TECHNIQUE: Multidetector CT imaging of the chest was performed using the standard protocol during bolus administration of intravenous contrast. Multiplanar CT image reconstructions and MIPs were obtained to evaluate the vascular anatomy. RADIATION DOSE REDUCTION: This exam was performed according to the departmental dose-optimization program which includes automated exposure control, adjustment of the mA and/or kV according to patient size and/or use of iterative reconstruction technique. CONTRAST:  60mL OMNIPAQUE IOHEXOL 350 MG/ML SOLN COMPARISON:  04/07/2020 FINDINGS: Cardiovascular: Opacification of pulmonary arteries is suboptimal, however no pulmonary emboli are identified. No evidence of thoracic aortic dissection. 4.1 cm thoracic aortic aneurysm remains stable. Prior CABG again noted. Stable moderate cardiomegaly and left ventricular hypertrophy. Mediastinum/Nodes: No masses or pathologically enlarged lymph nodes identified. Lungs/Pleura: No pulmonary mass, infiltrate, or effusion. Upper abdomen: Small calculus noted in lower pole of left kidney. No evidence of hydronephrosis. Musculoskeletal: No suspicious bone lesions identified. Review of the MIP images confirms the above findings. IMPRESSION:  Suboptimal opacification of pulmonary arteries, however no pulmonary embolism identified. No other acute findings. Stable 4.1 cm thoracic aortic aneurysm. Recommend annual imaging followup by CTA or MRA. This recommendation follows 2010 ACCF/AHA/AATS/ACR/ASA/SCA/SCAI/SIR/STS/SVM Guidelines for the Diagnosis and Management of Patients with Thoracic Aortic Disease. Circulation. 2010; 121: U045-W098: E266-e369. Aortic aneurysm NOS (ICD10-I71.9) Nonobstructing left renal calculus. Aortic Atherosclerosis (ICD10-I70.0). Electronically Signed   By: Danae OrleansJohn A Stahl M.D.   On: 08/24/2022 10:22   DG Chest Port 1 View  Result Date: 08/24/2022 CLINICAL DATA:  Shortness of breath EXAM: PORTABLE CHEST 1 VIEW COMPARISON:  08/20/2022 FINDINGS: Stable cardiomegaly. Prior sternotomy. Aortic atherosclerosis. Pulmonary vascular congestion. Mild bilateral interstitial prominence. No lobar consolidation. No pleural effusion or pneumothorax. IMPRESSION: Findings suggestive of CHF with mild interstitial edema. Electronically Signed   By: Duanne GuessNicholas  Plundo D.O.   On: 08/24/2022 09:15             LOS: 1 day    Time spent: 50 min    Sunnie NielsenNatalie Trae Bovenzi, DO [facility] 08/25/2022, 4:57 PM    Dictation software may have been used to generate the above note. Typos may occur and escape review in typed/dictated notes. Please contact Dr Lyn HollingsheadAlexander directly for clarity if needed.  Staff may message me via secure chat in Epic  but this may not receive an immediate response,  please page me for urgent matters!  If 7PM-7AM, please contact night coverage [email or web address]

## 2022-08-26 DIAGNOSIS — I5033 Acute on chronic diastolic (congestive) heart failure: Secondary | ICD-10-CM | POA: Diagnosis not present

## 2022-08-26 DIAGNOSIS — I4891 Unspecified atrial fibrillation: Secondary | ICD-10-CM | POA: Diagnosis not present

## 2022-08-26 LAB — CBC
HCT: 30.4 % — ABNORMAL LOW (ref 39.0–52.0)
Hemoglobin: 9.4 g/dL — ABNORMAL LOW (ref 13.0–17.0)
MCH: 25.1 pg — ABNORMAL LOW (ref 26.0–34.0)
MCHC: 30.9 g/dL (ref 30.0–36.0)
MCV: 81.1 fL (ref 80.0–100.0)
Platelets: 317 10*3/uL (ref 150–400)
RBC: 3.75 MIL/uL — ABNORMAL LOW (ref 4.22–5.81)
RDW: 14.9 % (ref 11.5–15.5)
WBC: 9.6 10*3/uL (ref 4.0–10.5)
nRBC: 0 % (ref 0.0–0.2)

## 2022-08-26 LAB — GLUCOSE, CAPILLARY: Glucose-Capillary: 157 mg/dL — ABNORMAL HIGH (ref 70–99)

## 2022-08-26 LAB — CULTURE, BLOOD (ROUTINE X 2)

## 2022-08-26 LAB — BASIC METABOLIC PANEL
Anion gap: 8 (ref 5–15)
BUN: 30 mg/dL — ABNORMAL HIGH (ref 6–20)
CO2: 24 mmol/L (ref 22–32)
Calcium: 8.5 mg/dL — ABNORMAL LOW (ref 8.9–10.3)
Chloride: 106 mmol/L (ref 98–111)
Creatinine, Ser: 1.34 mg/dL — ABNORMAL HIGH (ref 0.61–1.24)
GFR, Estimated: >60 mL/min (ref >60)
Glucose, Bld: 172 mg/dL — ABNORMAL HIGH (ref 70–99)
Potassium: 3.5 mmol/L (ref 3.5–5.1)
Sodium: 138 mmol/L (ref 135–145)

## 2022-08-26 LAB — HEMOGLOBIN A1C
Hgb A1c MFr Bld: 9.3 % — ABNORMAL HIGH (ref 4.8–5.6)
Mean Plasma Glucose: 220 mg/dL

## 2022-08-26 MED ORDER — FUROSEMIDE 40 MG PO TABS: 40.0000 mg | ORAL_TABLET | Freq: Every day | ORAL | 0 refills | Status: AC | PRN

## 2022-08-26 MED ORDER — AMIODARONE HCL 200 MG PO TABS: 200.0000 mg | ORAL_TABLET | Freq: Every day | ORAL | 0 refills | Status: AC

## 2022-08-26 MED ORDER — AMIODARONE HCL 400 MG PO TABS: 400.0000 mg | ORAL_TABLET | Freq: Two times a day (BID) | ORAL | 0 refills | Status: DC

## 2022-08-26 NOTE — Discharge Summary (Signed)
Physician Discharge Summary   Patient: Craig Reese MRN: [id number]  DOB: [birth year]   Admit:     Date of Admission: 08/24/2022 Admitted from: home   Discharge: Date of discharge: 08/26/22 Disposition: Home Condition at discharge: good  CODE STATUS: FULL CODE      Discharge Physician: Sunnie Nielsen, DO [facility]     PCP: System, Provider Not In  Recommendations for Outpatient Follow-up:  Follow up with PCP System, Provider Not In in 1-2 weeks Keep appointmetn w/ cardiology Recommend EP follow-up to consider alternative to amiodarone  Please obtain labs/tests: CBC, BMP in 1-2 weeks Please follow up on the following pending results: none PCP AND OTHER OUTPATIENT PROVIDERS: SEE BELOW FOR SPECIFIC DISCHARGE INSTRUCTIONS PRINTED FOR PATIENT IN ADDITION TO GENERIC AVS PATIENT INFO    Discharge Instructions     (HEART FAILURE PATIENTS) Call MD:  Anytime you have any of the following symptoms: 1) 3 pound weight gain in 24 hours or 5 pounds in 1 week 2) shortness of breath, with or without a dry hacking cough 3) swelling in the hands, feet or stomach 4) if you have to sleep on extra pillows at night in order to breathe.   Complete by: As directed    Diet - low sodium heart healthy   Complete by: As directed    Increase activity slowly   Complete by: As directed          Discharge Diagnoses: Principal Problem:   Acute on chronic diastolic congestive heart failure Active Problems:   CAD (coronary artery disease)   Chest pain   Atrial fibrillation with RVR   Hypotension   HLD (hyperlipidemia)   HTN (hypertension)   CKD stage 3a, GFR 45-59 ml/min   Hypokalemia   Hypothyroidism   Severe obstructive sleep apnea   Obesity (BMI 30-39.9)       Hospital Course: Craig Reese is a 62 y.o. 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  Recent hospitalization 04/03-04/09/2022. Patient has had multiple hospital evaluation and admissions concerning chest pain and shortness of breath, differentials include untreated sleep apnea, anginal pain, medication noncompliance in setting of decreased understanding complicated by [place] language deficiency.  2D echo on 08/21/2022 showed EF of 55 to 60% with grade 2 diastolic dysfunction.   Hospital course this admission: he presents with chest pain and shortness of breath. 04/07: In ED desaturation to 86% on room air, initially required nonrebreather, then weaned off to room and 97% on RA (does not have home O2 requirement). Patient was initially hypotensive with blood pressure 75/57, which improved to 100/68 after giving 1 L normal saline in ED. He has not taken his medications for more than a month, but he seems to be taking Xarelto. Troponin 40 -->  65, BNP 236, negative COVID PCR, worsening renal function, potassium 3.3, temperature normal, heart rate 37 --> 115 --> 70, RR 28 --> 15.  Chest x-ray showed mild interstitial edema.  CTA negative for PE.  Patient admitted to PCU as inpatient.  Dr. Cristal Deer of cardiology consulted.  04/08: cardiology to restart amiodarone, continue  monitoring. Suspect Afib RVR likely culprit for acute decompensation and hypotension in the setting of severe LVH and moderate aortic stenosis. Will need eventual EP eval. Lengthy discussion at bedside today w/ patient educating on his disease processes. Interpreter used.  04/09: Per cardiology can discharge on 200 mg amiodarone bid x1 week then back to 200 mg, confirmed w/ pharmacy pt has sufficient supply at home and on the way  [facility]. Has appt w/ primary cardiologist 04/17, he is instructed to keep this appointment.    Consultants:  Cardiology   Procedures: none      ASSESSMENT & PLAN:   Acute hypoxic respiratory failure - resolved   Acute on chronic diastolic congestive heart failure:  Low salt diet Lasix prn instead of scheduled  Follow w/ cardiology outpatient    CAD (coronary artery disease) and chest pain:  trop 40 --> 65, possibly due to demand ischemia. Lipitor Defer BB and ACE/ARB d/t low BP will not give nitroglycerin due to hypotension No plans for ischemic eval  Follow w/ cardiology outpatient    Atrial fibrillation with RVR Continue Xarelto Hold metoprolol due to hypotension Restarting amiodarone EP eval outpatient    Bioprosthetic valve Echo q6 mo  Hypotension Hx essential hypertension:  Etiology is not clear. Infection r/o    Hold all blood pressure medications and diuretics (Lasix, spironolactone, hydralazine, Cozaar, metoprolol, hypertension) Recommend permissive hypertension in the setting of recurrent episodes of hypotension.  Per cardiology, would NOT resume hydralazine or chlorthalidone   OSA Nonadherent to CPAP d/t mask intolerance This is likely contributing to cardiac stress, Afib RVR, hypoxia    HLD (hyperlipidemia) Lipitor   AKI on CKD stage 3a, GFR 45-59 ml/min - resolved Slightly worsened than baseline.  Recent baseline creatinine 1.56 point 08/20/2022.  His creatinine is 1.84, BUN 39, GFR 42 Follow-up by BMP Hold diuretics Patient received 1 L normal saline, repeat fluids as needed but caution given G2DD on recent Echo   Hypokalemia:  Potassium 3.3.  Magnesium 1.8 Repeat potassium   Hypothyroidism Recent TSH WNL Conitnue Synthroid   Severe obstructive sleep apnea Patient is not using CPAP   Obesity (BMI 30-39.9):  Body weight 117.9 kg, BMI 37.31 Encouraged losing weight Exercise and healthy diet                Discharge Instructions  Allergies as of 08/26/2022   No Known Allergies      Medication List     STOP taking these medications    acetaminophen 500 MG tablet Commonly known as: TYLENOL   canagliflozin 300 MG Tabs tablet Commonly known as: INVOKANA   chlorthalidone 25 MG tablet Commonly known as: HYGROTON   hydrALAZINE 50 MG tablet Commonly known as: APRESOLINE   losartan 100 MG tablet Commonly known as: COZAAR   metoprolol tartrate 50 MG tablet Commonly known as: LOPRESSOR   spironolactone 25 MG tablet Commonly known as: ALDACTONE   sucralfate 1 GM/10ML suspension Commonly known as: CARAFATE       TAKE these medications    amiodarone 400 MG tablet Commonly known as: PACERONE Take 1 tablet (400 mg total) by mouth 2 (two) times daily for 7 days.   amiodarone 200 MG tablet Commonly known as: Pacerone Take 1 tablet (200 mg total) by mouth daily. Start taking on: September 03, 2022   atorvastatin 80 MG tablet Commonly known as: LIPITOR Take 80 mg by mouth every evening.   furosemide 40 MG  tablet Commonly known as: LASIX Take 1 tablet (40 mg total) by mouth daily as needed. for up to 3 days for increased leg swelling, shortness of breath, weight gain 5+ lbs over 1-2 days. Seek medical care if these symptoms are not improving with increased dose. What changed:  when to take this reasons to take this additional instructions   gabapentin 300 MG capsule Commonly known as: NEURONTIN Take 300 mg by mouth 2 (two) times daily.   levothyroxine 300 MCG tablet Commonly known as: SYNTHROID Take 300 mcg by mouth daily before breakfast. What changed: Another medication with the same name was removed. Continue taking this medication, and follow the directions you see here.   metFORMIN 1000 MG tablet Commonly known as: GLUCOPHAGE Take 1,000 mg by mouth 2 (two) times daily with a meal.   pantoprazole 20 MG tablet Commonly known as:  PROTONIX Take 1 tablet (20 mg total) by mouth 2 (two) times daily.   rivaroxaban 20 MG Tabs tablet Commonly known as: XARELTO Take 1 tablet (20 mg total) by mouth daily. What changed: when to take this          No Known Allergies   Subjective: pt reports chest pain has resolved, no dizziness, no SOB. Interpreter used    Discharge Exam: BP 123/78 (BP Location: Right Arm)   Pulse 64   Temp 97.6 F (36.4 C) (Oral)   Resp 18   Ht 5\' 10"  (1.778 m)   Wt 122.3 kg   SpO2 99%   BMI 38.68 kg/m  General: Pt is alert, awake, not in acute distress Cardiovascular: RRR, S1/S2 +systolic murmur stable from previous, no rubs, no gallops Respiratory: CTA bilaterally, no wheezing, no rhonchi Abdominal: Soft, NT, ND, bowel sounds + Extremities: no edema, no cyanosis     The results of significant diagnostics from this hospitalization (including imaging, microbiology, ancillary and laboratory) are listed below for reference.     Microbiology: Recent Results (from the past 240 hour(s))  Resp panel by RT-PCR (RSV, Flu A&B, Covid) Anterior Nasal Swab     Status: None   Collection Time: 08/24/22  9:18 AM   Specimen: Anterior Nasal Swab  Result Value Ref Range Status   SARS Coronavirus 2 by RT PCR NEGATIVE NEGATIVE Final    Comment: (NOTE) SARS-CoV-2 target nucleic acids are NOT DETECTED.  The SARS-CoV-2 RNA is generally detectable in upper respiratory specimens during the acute phase of infection. The lowest concentration of SARS-CoV-2 viral copies this assay can detect is 138 copies/mL. A negative result does not preclude SARS-Cov-2 infection and should not be used as the sole basis for treatment or other patient management decisions. A negative result may occur with  improper specimen collection/handling, submission of specimen other than nasopharyngeal swab, presence of viral mutation(s) within the areas targeted by this assay, and inadequate number of viral copies(<138  copies/mL). A negative result must be combined with clinical observations, patient history, and epidemiological information. The expected result is Negative.  Fact Sheet for Patients:  BloggerCourse.com  Fact Sheet for Healthcare Providers:  SeriousBroker.it  This test is no t yet approved or cleared by the [place] FDA and  has been authorized for detection and/or diagnosis of SARS-CoV-2 by FDA under an Emergency Use Authorization (EUA). This EUA will remain  in effect (meaning this test can be used) for the duration of the COVID-19 declaration under Section 564(b)(1) of the Act, 21 U.S.C.section 360bbb-3(b)(1), unless the authorization is terminated  or revoked sooner.  Influenza A by PCR NEGATIVE NEGATIVE Final   Influenza B by PCR NEGATIVE NEGATIVE Final    Comment: (NOTE) The Xpert Xpress SARS-CoV-2/FLU/RSV plus assay is intended as an aid in the diagnosis of influenza from Nasopharyngeal swab specimens and should not be used as a sole basis for treatment. Nasal washings and aspirates are unacceptable for Xpert Xpress SARS-CoV-2/FLU/RSV testing.  Fact Sheet for Patients: BloggerCourse.com  Fact Sheet for Healthcare Providers: SeriousBroker.it  This test is not yet approved or cleared by the [place] FDA and has been authorized for detection and/or diagnosis of SARS-CoV-2 by FDA under an Emergency Use Authorization (EUA). This EUA will remain in effect (meaning this test can be used) for the duration of the COVID-19 declaration under Section 564(b)(1) of the Act, 21 U.S.C. section 360bbb-3(b)(1), unless the authorization is terminated or revoked.     Resp Syncytial Virus by PCR NEGATIVE NEGATIVE Final    Comment: (NOTE) Fact Sheet for Patients: BloggerCourse.com  Fact Sheet for Healthcare  Providers: SeriousBroker.it  This test is not yet approved or cleared by the [place] FDA and has been authorized for detection and/or diagnosis of SARS-CoV-2 by FDA under an Emergency Use Authorization (EUA). This EUA will remain in effect (meaning this test can be used) for the duration of the COVID-19 declaration under Section 564(b)(1) of the Act, 21 U.S.C. section 360bbb-3(b)(1), unless the authorization is terminated or revoked.  Performed at [facility], [address] [place], [place] [address]   Culture, blood (Routine X 2) w Reflex to ID Panel     Status: None (Preliminary result)   Collection Time: 08/24/22 11:22 AM   Specimen: BLOOD LEFT HAND  Result Value Ref Range Status   Specimen Description BLOOD LEFT HAND  Final   Special Requests   Final    BOTTLES DRAWN AEROBIC AND ANAEROBIC Blood Culture adequate volume   Culture   Final    NO GROWTH 2 DAYS Performed at [facility], [address] [place], [place] [address]    Report Status PENDING  Incomplete  Culture, blood (Routine X 2) w Reflex to ID Panel     Status: None (Preliminary result)   Collection Time: 08/24/22 11:22 AM   Specimen: BLOOD RIGHT HAND  Result Value Ref Range Status   Specimen Description BLOOD RIGHT HAND  Final   Special Requests   Final    BOTTLES DRAWN AEROBIC AND ANAEROBIC Blood Culture adequate volume   Culture   Final    NO GROWTH 2 DAYS Performed at [facility], [address] [place], [place] [address]    Report Status PENDING  Incomplete     Labs: BNP (last 3 results) Recent Labs    03/08/22 0831 08/24/22 0826  BNP 164.5* 236.2*   Basic Metabolic Panel: Recent Labs  Lab 08/20/22 1319 08/21/22 0527 08/24/22 0826 08/24/22 1100 08/25/22 0627 08/26/22 0530  NA 137  --  136  --  138 138  K 2.9*  --  3.3*  --  3.7 3.5  CL 100  --  99  --  104 106  CO2 25  --  25  --  26 24  GLUCOSE 222*  --  226*  --  178*  172*  BUN 44*  --  39*  --  38* 30*  CREATININE 1.56*  --  1.84*  --  1.49* 1.34*  CALCIUM 9.5  --  9.0  --  8.6* 8.5*  MG 2.2 2.1  --  1.8 2.0  --    Liver Function Tests: Recent Labs  Lab 08/20/22 1319 08/24/22 0826  AST 32 20  ALT 28 20  ALKPHOS 74 93  BILITOT 0.6 0.5  PROT 8.4* 8.2*  ALBUMIN 4.0 3.9   No results for input(s): "LIPASE", "AMYLASE" in the last 168 hours. No results for input(s): "AMMONIA" in the last 168 hours. CBC: Recent Labs  Lab 08/20/22 1319 08/24/22 0826 08/25/22 0627 08/26/22 0530  WBC 8.7 11.3* 9.1 9.6  NEUTROABS  --  6.7  --   --   HGB 10.8* 10.9* 9.3* 9.4*  HCT 34.0* 35.5* 29.7* 30.4*  MCV 80.0 82.0 82.0 81.1  PLT 352 356 291 317   Cardiac Enzymes: No results for input(s): "CKTOTAL", "CKMB", "CKMBINDEX", "TROPONINI" in the last 168 hours. BNP: Invalid input(s): "POCBNP" CBG: Recent Labs  Lab 08/25/22 0925 08/25/22 1219 08/25/22 1526 08/25/22 1930 08/26/22 0744  GLUCAP 159* 208* 183* 246* 157*   D-Dimer No results for input(s): "DDIMER" in the last 72 hours. Hgb A1c Recent Labs    08/24/22 1500  HGBA1C 9.3*   Lipid Profile No results for input(s): "CHOL", "HDL", "LDLCALC", "TRIG", "CHOLHDL", "LDLDIRECT" in the last 72 hours. Thyroid function studies No results for input(s): "TSH", "T4TOTAL", "T3FREE", "THYROIDAB" in the last 72 hours.  Invalid input(s): "FREET3" Anemia work up No results for input(s): "VITAMINB12", "FOLATE", "FERRITIN", "TIBC", "IRON", "RETICCTPCT" in the last 72 hours. Urinalysis    Component Value Date/Time   COLORURINE Yellow 09/22/2013 2248   APPEARANCEUR Clear 09/22/2013 2248   LABSPEC >1.060 09/22/2013 2248   PHURINE 5.0 09/22/2013 2248   GLUCOSEU Negative 09/22/2013 2248   HGBUR 1+ 09/22/2013 2248   BILIRUBINUR Negative 09/22/2013 2248   KETONESUR Negative 09/22/2013 2248   PROTEINUR Negative 09/22/2013 2248   NITRITE Negative 09/22/2013 2248   LEUKOCYTESUR Negative 09/22/2013 2248    Sepsis Labs Recent Labs  Lab 08/20/22 1319 08/24/22 0826 08/25/22 0627 08/26/22 0530  WBC 8.7 11.3* 9.1 9.6   Microbiology Recent Results (from the past 240 hour(s))  Resp panel by RT-PCR (RSV, Flu A&B, Covid) Anterior Nasal Swab     Status: None   Collection Time: 08/24/22  9:18 AM   Specimen: Anterior Nasal Swab  Result Value Ref Range Status   SARS Coronavirus 2 by RT PCR NEGATIVE NEGATIVE Final    Comment: (NOTE) SARS-CoV-2 target nucleic acids are NOT DETECTED.  The SARS-CoV-2 RNA is generally detectable in upper respiratory specimens during the acute phase of infection. The lowest concentration of SARS-CoV-2 viral copies this assay can detect is 138 copies/mL. A negative result does not preclude SARS-Cov-2 infection and should not be used as the sole basis for treatment or other patient management decisions. A negative result may occur with  improper specimen collection/handling, submission of specimen other than nasopharyngeal swab, presence of viral mutation(s) within the areas targeted by this assay, and inadequate number of viral copies(<138 copies/mL). A negative result must be combined with clinical observations, patient history, and epidemiological information. The expected result is Negative.  Fact Sheet for Patients:  BloggerCourse.com  Fact Sheet for Healthcare Providers:  SeriousBroker.it  This test is no t yet approved or cleared by the [place] FDA and  has been authorized for detection and/or diagnosis of SARS-CoV-2 by FDA under an Emergency Use Authorization (EUA). This EUA will remain  in effect (meaning this test can be used) for the duration of the COVID-19 declaration under Section 564(b)(1) of the Act, 21 U.S.C.section 360bbb-3(b)(1), unless  the authorization is terminated  or revoked sooner.       Influenza A by PCR NEGATIVE NEGATIVE Final   Influenza B by PCR NEGATIVE NEGATIVE Final     Comment: (NOTE) The Xpert Xpress SARS-CoV-2/FLU/RSV plus assay is intended as an aid in the diagnosis of influenza from Nasopharyngeal swab specimens and should not be used as a sole basis for treatment. Nasal washings and aspirates are unacceptable for Xpert Xpress SARS-CoV-2/FLU/RSV testing.  Fact Sheet for Patients: BloggerCourse.com  Fact Sheet for Healthcare Providers: SeriousBroker.it  This test is not yet approved or cleared by the [place] FDA and has been authorized for detection and/or diagnosis of SARS-CoV-2 by FDA under an Emergency Use Authorization (EUA). This EUA will remain in effect (meaning this test can be used) for the duration of the COVID-19 declaration under Section 564(b)(1) of the Act, 21 U.S.C. section 360bbb-3(b)(1), unless the authorization is terminated or revoked.     Resp Syncytial Virus by PCR NEGATIVE NEGATIVE Final    Comment: (NOTE) Fact Sheet for Patients: BloggerCourse.com  Fact Sheet for Healthcare Providers: SeriousBroker.it  This test is not yet approved or cleared by the [place] FDA and has been authorized for detection and/or diagnosis of SARS-CoV-2 by FDA under an Emergency Use Authorization (EUA). This EUA will remain in effect (meaning this test can be used) for the duration of the COVID-19 declaration under Section 564(b)(1) of the Act, 21 U.S.C. section 360bbb-3(b)(1), unless the authorization is terminated or revoked.  Performed at [facility], [address] [place], [place] [address]   Culture, blood (Routine X 2) w Reflex to ID Panel     Status: None (Preliminary result)   Collection Time: 08/24/22 11:22 AM   Specimen: BLOOD LEFT HAND  Result Value Ref Range Status   Specimen Description BLOOD LEFT HAND  Final   Special Requests   Final    BOTTLES DRAWN AEROBIC AND ANAEROBIC Blood Culture adequate  volume   Culture   Final    NO GROWTH 2 DAYS Performed at [facility], [address], [place], [place] [address]    Report Status PENDING  Incomplete  Culture, blood (Routine X 2) w Reflex to ID Panel     Status: None (Preliminary result)   Collection Time: 08/24/22 11:22 AM   Specimen: BLOOD RIGHT HAND  Result Value Ref Range Status   Specimen Description BLOOD RIGHT HAND  Final   Special Requests   Final    BOTTLES DRAWN AEROBIC AND ANAEROBIC Blood Culture adequate volume   Culture   Final    NO GROWTH 2 DAYS Performed at [facility], [address], [place], [place] [address]    Report Status PENDING  Incomplete   Imaging CT Angio Chest PE W and/or Wo Contrast  Result Date: 08/24/2022 CLINICAL DATA:  Acute onset chest pain and tachycardia. Hypoxia. High probability for pulmonary embolism. EXAM: CT ANGIOGRAPHY CHEST WITH CONTRAST TECHNIQUE: Multidetector CT imaging of the chest was performed using the standard protocol during bolus administration of intravenous contrast. Multiplanar CT image reconstructions and MIPs were obtained to evaluate the vascular anatomy. RADIATION DOSE REDUCTION: This exam was performed according to the departmental dose-optimization program which includes automated exposure control, adjustment of the mA and/or kV according to patient size and/or use of iterative reconstruction technique. CONTRAST:  60mL OMNIPAQUE IOHEXOL 350 MG/ML SOLN COMPARISON:  04/07/2020 FINDINGS: Cardiovascular: Opacification of pulmonary arteries is suboptimal, however no pulmonary emboli are identified. No evidence of thoracic  aortic dissection. 4.1 cm thoracic aortic aneurysm remains stable. Prior CABG again noted. Stable moderate cardiomegaly and left ventricular hypertrophy. Mediastinum/Nodes: No masses or pathologically enlarged lymph nodes identified. Lungs/Pleura: No pulmonary mass, infiltrate, or effusion. Upper abdomen: Small calculus noted in lower pole of left  kidney. No evidence of hydronephrosis. Musculoskeletal: No suspicious bone lesions identified. Review of the MIP images confirms the above findings. IMPRESSION: Suboptimal opacification of pulmonary arteries, however no pulmonary embolism identified. No other acute findings. Stable 4.1 cm thoracic aortic aneurysm. Recommend annual imaging followup by CTA or MRA. This recommendation follows 2010 ACCF/AHA/AATS/ACR/ASA/SCA/SCAI/SIR/STS/SVM Guidelines for the Diagnosis and Management of Patients with Thoracic Aortic Disease. Circulation. 2010; 121: Z610-R604. Aortic aneurysm NOS (ICD10-I71.9) Nonobstructing left renal calculus. Aortic Atherosclerosis (ICD10-I70.0). Electronically Signed   By: Danae Orleans M.D.   On: 08/24/2022 10:22   DG Chest Port 1 View  Result Date: 08/24/2022 CLINICAL DATA:  Shortness of breath EXAM: PORTABLE CHEST 1 VIEW COMPARISON:  08/20/2022 FINDINGS: Stable cardiomegaly. Prior sternotomy. Aortic atherosclerosis. Pulmonary vascular congestion. Mild bilateral interstitial prominence. No lobar consolidation. No pleural effusion or pneumothorax. IMPRESSION: Findings suggestive of CHF with mild interstitial edema. Electronically Signed   By: Duanne Guess D.O.   On: 08/24/2022 09:15      Time coordinating discharge: over 30 minutes  SIGNED:  Sunnie Nielsen DO [facility]

## 2022-08-26 NOTE — Inpatient Diabetes Management (Signed)
Inpatient Diabetes Program Recommendations  AACE/ADA: New Consensus Statement on Inpatient Glycemic Control (2015)  Target Ranges:  Prepandial:   less than 140 mg/dL      Peak postprandial:   less than 180 mg/dL (1-2 hours)      Critically ill patients:  140 - 180 mg/dL    Latest Reference Range & Units 03/06/22 11:09 08/24/22 15:00  Hemoglobin A1C 4.8 - 5.6 % 9.5 (H) 9.3 (H)  220 mg/dl  (H): Data is abnormally high  Latest Reference Range & Units 08/25/22 09:25 08/25/22 12:19 08/25/22 15:26 08/25/22 19:30  Glucose-Capillary 70 - 99 mg/dL 030 (H)  2 units Novolog  208 (H)  2 units Novolog  183 (H)  2 units Novolog @1800  246 (H)  2 units Novolog @2115   (H): Data is abnormally high  Latest Reference Range & Units 08/26/22 07:44  Glucose-Capillary 70 - 99 mg/dL 131 (H)  2 units Novolog  (H): Data is abnormally high   Admit with: Acute on chronic diastolic congestive heart failure/ Atrial fibrillation with RVR   History: DM2, CHF, CKD  Home DM Meds: Invokana 300 mg daily        Metformin 1000 mg BID  Current Orders: Novolog Sensitive Correction Scale/ SSI (0-9 units) TID AC + HS    PCP: Dr. Candiss Norse with [facility] [place], [place]   MD- Note pt ate 100% meals yesterday.  May consider adding low dose Novolog Meal Coverage while home oral DM meds are on hold: Novolog 2 units TID with meals HOLD if pt NPO HOLD if pt eats <50% meals     --Will follow patient during hospitalization--  Ambrose Finland RN, MSN, CDCES Diabetes Coordinator Inpatient Glycemic Control Team Team Pager: [phone] (8a-5p)

## 2022-08-26 NOTE — Progress Notes (Signed)
Rounding Note    Patient Name: Craig Reese Date of Encounter: 08/26/2022  [facility] Cardiologist: [facility]  Subjective   He is in NSR. Patient denies chest pain or SOB. Possible d/c today. He has follow up at [facility] cardiology.   Inpatient Medications    Scheduled Meds:  amiodarone  400 mg Oral BID   atorvastatin  80 mg Oral QPM   gabapentin  300 mg Oral BID   insulin aspart  0-5 Units Subcutaneous QHS   insulin aspart  0-9 Units Subcutaneous TID WC   levothyroxine  300 mcg Oral QAC breakfast   pantoprazole  20 mg Oral BID   rivaroxaban  20 mg Oral Q supper   Continuous Infusions:  PRN Meds: acetaminophen, albuterol, dextromethorphan-guaiFENesin, diphenhydrAMINE, morphine injection   Vital Signs    Vitals:   08/25/22 2300 08/26/22 0342 08/26/22 0745 08/26/22 0829  BP: 127/68 121/73 123/78   Pulse: 73 66 64   Resp: 17 18 18    Temp: 98.1 F (36.7 C) 98.1 F (36.7 C) 97.6 F (36.4 C)   TempSrc: Oral Oral Oral   SpO2: 95% 98% 99%   Weight:    122.3 kg  Height:        Intake/Output Summary (Last 24 hours) at 08/26/2022 1057 Last data filed at 08/26/2022 1051 Gross per 24 hour  Intake 840 ml  Output --  Net 840 ml      08/26/2022    8:29 AM 08/25/2022    8:47 AM 08/24/2022    8:22 AM  Last 3 Weights  Weight (lbs) 269 lb 9.6 oz 267 lb 10.2 oz 260 lb  Weight (kg) 122.29 kg 121.4 kg 117.935 kg      Telemetry    NSR 1st degree block - Personally Reviewed  ECG    No new - Personally Reviewed  Physical Exam   GEN: No acute distress.   Neck: No JVD Cardiac: RRR, no murmurs, rubs, or gallops.  Respiratory: Clear to auscultation bilaterally. GI: Soft, nontender, non-distended  MS: No edema; No deformity. Neuro:  Nonfocal  Psych: Normal affect   Labs    High Sensitivity Troponin:   Recent Labs  Lab 08/24/22 0826 08/24/22 1215 08/24/22 1500 08/24/22 1734 08/24/22 2029  TROPONINIHS 40* 65* 82* 91* 87*     Chemistry Recent Labs  Lab  08/20/22 1319 08/21/22 0527 08/24/22 0826 08/24/22 1100 08/25/22 0627 08/26/22 0530  NA 137  --  136  --  138 138  K 2.9*  --  3.3*  --  3.7 3.5  CL 100  --  99  --  104 106  CO2 25  --  25  --  26 24  GLUCOSE 222*  --  226*  --  178* 172*  BUN 44*  --  39*  --  38* 30*  CREATININE 1.56*  --  1.84*  --  1.49* 1.34*  CALCIUM 9.5  --  9.0  --  8.6* 8.5*  MG 2.2 2.1  --  1.8 2.0  --   PROT 8.4*  --  8.2*  --   --   --   ALBUMIN 4.0  --  3.9  --   --   --   AST 32  --  20  --   --   --   ALT 28  --  20  --   --   --   ALKPHOS 74  --  93  --   --   --  BILITOT 0.6  --  0.5  --   --   --   GFRNONAA 51*  --  42*  --  54* >60  ANIONGAP 12  --  12  --  8 8    Lipids  Recent Labs  Lab 08/22/22 0444  CHOL 114  TRIG 180*  HDL 24*  LDLCALC 54  CHOLHDL 4.8    Hematology Recent Labs  Lab 08/24/22 0826 08/25/22 0627 08/26/22 0530  WBC 11.3* 9.1 9.6  RBC 4.33 3.62* 3.75*  HGB 10.9* 9.3* 9.4*  HCT 35.5* 29.7* 30.4*  MCV 82.0 82.0 81.1  MCH 25.2* 25.7* 25.1*  MCHC 30.7 31.3 30.9  RDW 15.1 15.2 14.9  PLT 356 291 317   Thyroid  Recent Labs  Lab 08/21/22 0532  TSH 1.492    BNP Recent Labs  Lab 08/24/22 0826  BNP 236.2*    DDimer No results for input(s): "DDIMER" in the last 168 hours.   Radiology    No results found.  Cardiac Studies   Echo 08/21/22  1. Left ventricular ejection fraction, by estimation, is 55 to 60%. The  left ventricle has normal function. The left ventricle has no regional  wall motion abnormalities. There is severe left ventricular hypertrophy.  Left ventricular diastolic parameters   are consistent with Grade II diastolic dysfunction (pseudonormalization).   2. Right ventricular systolic function is mildly reduced. The right  ventricular size is normal. There is normal pulmonary artery systolic  pressure.   3. Left atrial size was severely dilated.   4. Right atrial size was moderately dilated.   5. The mitral valve is normal in  structure. Mild mitral valve  regurgitation. No evidence of mitral stenosis.   6. The aortic valve has been repaired/replaced. Aortic valve  regurgitation is not visualized. Moderate aortic valve stenosis. Aortic  valve area, by VTI measures 1.24 cm. Aortic valve mean gradient measures  29.5 mmHg.   7. Aortic dilatation noted. There is mild dilatation of the ascending  aorta, measuring 40 mm.   8. The inferior vena cava is normal in size with <50% respiratory  variability, suggesting right atrial pressure of 8 mmHg.   LHC 07/31/22 Findings:  Coronary artery disease including 80% proximal LCx stenosis, 40-50% LAD  stenosis and 50-60% proximal RCA stenosis.  SVG-OM graft has 50% proximal stenosis   Impression/Recommendations:  1. His CAD is relatively unchanged from his cardiac cath in 2022.  He has  a proximal SVG disease and LAD disease that is moderate, and unchanged  from his previous cath.   2. His symptoms of chest discomfort and dyspnea may be related to OSA.   Consider sleep study.  3. Recommend medical management of moderate disease at this time.   Complications:  None    Echo 06/2022 Summary   1. The left ventricle is normal in size with moderately increased wall  thickness.   2. The left ventricular systolic function is normal, LVEF is visually  estimated at > 55%.    3. Aortic valve replacement (26 mm bioprosthetic, implantation date:  10/12/2013).   4. Aortic valve Doppler indices are consistent with normal prosthetic valve  function.   5. The left atrium is mildly dilated in size.    6. The right ventricle is normal in size, with normal systolic function.    LHC 08/2020 Findings:  1. Diffuse moderate CAD without focal high grade stenosis.  2. Patent SVG to OM with 40-50% proximal stenosis.   Recommendations:  1. Aggressive secondary prevention and medical therapy.   2. Follow up with primary cardiologist.   Complications:   None    Echo 12/2013 Left  Ventricle      The left  ventricle is normal  in size with  severely increased  wall      thickness  and mildly decreased  contraction.      The visually  estimated  left ventricular  ejection fraction  is 45-50%.      This is  significantly increased  compared  to prior echo study  of      10/17/13.      Diastology  was not evaluated  in this study.  Mitral Valve      The mitral  valve is mildly  thickened with  normal leaflet  mobility.      There  is trivial mitral  regurgitation by  color flow and  continuous      wave Doppler  examinations.  Left Atrium      The left  atrium is mildly  dilated.  Aortic Valve      The bioprosthetic  aortic  valve is well-seated.      There  is trivial aortic  regurgitation by  color flow and  continuous      wave Doppler  examinations.  Aorta     The ascending  thoracic  aorta is normal in  diameter at the  level of the      left ventricular  outflow  tract and sinuses  of Valsalva;  the aorta is      suboptimally  imaged above  the sinotubular  junction and at  the      transverse  arch.  Right Ventricle      The right  ventricle is  normal in size with  normal wall thickness  and      normal  contraction.  Right Atrium      The right  atrium is mildly  dilated.  Inferior Vena Cava      Suboptimally  imaged inferior  vena cava is  probably normal  in size with      physiologic  phasic respiratory  variation.  Pericardium     There  is a relatively echo-free  space consistent  with trivial      posterior  pericardial effusion  and/or subepicardial  fat.    Echo 10/2013 Technically  difficult study  due to chest  wall and/or lung  interference      Aortic  root replacement      Aortic  valve replacement  (26 mm freestyle  bioprosthetic valve  and      root,  10/12/13)      Coronary  artery bypass  graft surgery      Left ventricular  hypertrophy  (severe)      Contractile  left ventricular  dysfunction  (severe)      Decreased  left  ventricular  ejection fraction  (25-30%)      Degenerative  mitral valve  disease      Dilated  left atrium      Biosprosthetic  aortic valve      Dilated  right ventricle      Contractile  right ventricular  dysfunction  (severe)      Pericardial  effusion (see  detail below)   Patient Profile     62 y.o. male  with a h/o CAD s/p CABG in 2015,  HFimpEF, HTN, HLD, s/p AVR (aortic valve 69mm freestyle implanted 10/12/13 during CABG), CKD stage 3, paroxysmal Afib on Xarelto, DM2, OSA, thyroid carcinoma who is being seen 08/25/2022 for the evaluation of heart failure and atrial fibrillation.  Assessment & Plan    Afib RVR - suspect this is contributing to hypotension and acute decompensation and possible chest pain in the setting of LVH and moderate AS. - amiodarone was discontinued at the last admission for prolonged qt, however he continues to have rapid afib - amiodarone load was restarted - he is in NSR  - AA limited due to CHF and LVH - continue Xarelto for stroke ppx - patient needs to see EP as OP  S/p bioprosthetic aortic valve replacement - recent echo showed moderate AS  - followed at [facility]  Atypical chest pain - mildly elevated troponin not consistent with acute coronary syndrome.  - recent cath at [facility] 07/2022 showed no obstructive disease, medical therapy was recommended - no plans for ischemic evaluation  Acute on chronic diastolic heart failure - appears euvolemic - PTA lasix 40mg  daily - Echo showed LVEF 55-60%, no WMA, G2DD, mildly reduced RVSF, dilated atrium, mild MR, moderate AS  HTN - BP held for hypotension - PTA chlorthalidone, HCTZ, Losartan, metoprolol and spironolactone - restart Losartan and spiro as able. May not tolerate BB given amio and HR in the 60s  OSA - reports intolerance to CPAP, likely contributing to Afib  For questions or updates, please contact [facility] Please consult [email or web address] for contact info under         Signed, Naveen Clardy David Stall, PA-C  08/26/2022, 10:57 AM

## 2022-08-27 LAB — CULTURE, BLOOD (ROUTINE X 2)
Special Requests: ADEQUATE
Special Requests: ADEQUATE

## 2022-08-28 LAB — CULTURE, BLOOD (ROUTINE X 2)
Culture: NO GROWTH
Culture: NO GROWTH

## 2022-08-29 LAB — CULTURE, BLOOD (ROUTINE X 2)

## 2022-09-06 ENCOUNTER — Emergency Department: Payer: Self-pay

## 2022-09-06 ENCOUNTER — Observation Stay
Admission: EM | Admit: 2022-09-06 | Discharge: 2022-09-08 | Disposition: A | Discharge status: Home / Self Care | Payer: Self-pay | Attending: Internal Medicine | Admitting: Internal Medicine

## 2022-09-06 ENCOUNTER — Other Ambulatory Visit: Payer: Self-pay

## 2022-09-06 ENCOUNTER — Encounter: Payer: Self-pay | Admitting: Emergency Medicine

## 2022-09-06 DIAGNOSIS — R079 Chest pain, unspecified: Secondary | ICD-10-CM | POA: Diagnosis present

## 2022-09-06 DIAGNOSIS — E785 Hyperlipidemia, unspecified: Secondary | ICD-10-CM | POA: Insufficient documentation

## 2022-09-06 DIAGNOSIS — I4819 Other persistent atrial fibrillation: Secondary | ICD-10-CM

## 2022-09-06 DIAGNOSIS — G4733 Obstructive sleep apnea (adult) (pediatric): Secondary | ICD-10-CM | POA: Insufficient documentation

## 2022-09-06 DIAGNOSIS — N183 Chronic kidney disease, stage 3 unspecified: Secondary | ICD-10-CM | POA: Diagnosis present

## 2022-09-06 DIAGNOSIS — Z6830 Body mass index (BMI) 30.0-30.9, adult: Secondary | ICD-10-CM | POA: Insufficient documentation

## 2022-09-06 DIAGNOSIS — Z7984 Long term (current) use of oral hypoglycemic drugs: Secondary | ICD-10-CM | POA: Insufficient documentation

## 2022-09-06 DIAGNOSIS — I480 Paroxysmal atrial fibrillation: Secondary | ICD-10-CM | POA: Insufficient documentation

## 2022-09-06 DIAGNOSIS — I130 Hypertensive heart and chronic kidney disease with heart failure and stage 1 through stage 4 chronic kidney disease, or unspecified chronic kidney disease: Secondary | ICD-10-CM | POA: Insufficient documentation

## 2022-09-06 DIAGNOSIS — E876 Hypokalemia: Secondary | ICD-10-CM | POA: Insufficient documentation

## 2022-09-06 DIAGNOSIS — E1165 Type 2 diabetes mellitus with hyperglycemia: Secondary | ICD-10-CM | POA: Diagnosis present

## 2022-09-06 DIAGNOSIS — E039 Hypothyroidism, unspecified: Secondary | ICD-10-CM | POA: Insufficient documentation

## 2022-09-06 DIAGNOSIS — Z7901 Long term (current) use of anticoagulants: Secondary | ICD-10-CM | POA: Insufficient documentation

## 2022-09-06 DIAGNOSIS — E669 Obesity, unspecified: Secondary | ICD-10-CM | POA: Diagnosis present

## 2022-09-06 DIAGNOSIS — I5032 Chronic diastolic (congestive) heart failure: Secondary | ICD-10-CM | POA: Insufficient documentation

## 2022-09-06 DIAGNOSIS — Z79899 Other long term (current) drug therapy: Secondary | ICD-10-CM | POA: Insufficient documentation

## 2022-09-06 DIAGNOSIS — I2510 Atherosclerotic heart disease of native coronary artery without angina pectoris: Secondary | ICD-10-CM | POA: Insufficient documentation

## 2022-09-06 DIAGNOSIS — R0789 Other chest pain: Principal | ICD-10-CM | POA: Insufficient documentation

## 2022-09-06 DIAGNOSIS — E1122 Type 2 diabetes mellitus with diabetic chronic kidney disease: Secondary | ICD-10-CM | POA: Insufficient documentation

## 2022-09-06 DIAGNOSIS — E1169 Type 2 diabetes mellitus with other specified complication: Secondary | ICD-10-CM | POA: Diagnosis present

## 2022-09-06 DIAGNOSIS — N1831 Chronic kidney disease, stage 3a: Secondary | ICD-10-CM | POA: Insufficient documentation

## 2022-09-06 NOTE — ED Provider Notes (Signed)
[facility] Provider Note    Event Date/Time   First MD Initiated Contact with Patient 09/06/22 2344     (approximate)   History   Chest Pain   HPI  Craig Reese is a 62 y.o. male who presents to the ED for evaluation of Chest Pain   Review medical DC summary from 4/9 he was admitted for chest pain.  History of diastolic dysfunction, CAD and one-vessel CABG as well as a bioprosthetic aortic valve in 2015.  Patient endocarditis.  A-fib on Xarelto.  CKD.  Had a left heart cath done at [facility] on 3/14 moderate multivessel disease, recommending medical management.  Echo 2 weeks ago with EF 55% and grade 2 diastolic dysfunction.,  CTA chest negative for PE.  Patient presents to the ED for evaluation of chronic chest pain.  He reports about a month of chest pain that seemed worse tonight while seated.  No syncope, fevers.  Does report dyspnea on exertion.  Denies orthopnea  Physical Exam   Triage Vital Signs: ED Triage Vitals  Enc Vitals Group     BP 09/06/22 2348 115/82     Pulse Rate 09/06/22 2348 68     Resp 09/06/22 2348 11     Temp 09/06/22 2348 98 F (36.7 C)     Temp Source 09/06/22 2348 Oral     SpO2 09/06/22 2348 98 %     Weight 09/06/22 2347 269 lb 9.6 oz (122.3 kg)     Height 09/06/22 2347  (1.778 m)     Head Circumference --      Peak Flow --      Pain Score --      Pain Loc --      Pain Edu? --      Excl. in GC? --     Most recent vital signs: Vitals:   09/07/22 0230 09/07/22 0300  BP: (!) 106/55 (!) 105/53  Pulse: 62 61  Resp: 15 16  Temp:    SpO2: 95% 95%    General: Awake, no distress.  Seems uncomfortable CV:  Good peripheral perfusion.  Resp:  Normal effort.  Abd:  No distention.  MSK:  No deformity noted.  Neuro:  No focal deficits appreciated. Other:     ED Results / Procedures / Treatments   Labs (all labs ordered are listed, but only abnormal results are displayed) Labs Reviewed  CBC WITH  DIFFERENTIAL/PLATELET - Abnormal; Notable for the following components:      Result Value   RBC 4.08 (*)    Hemoglobin 10.0 (*)    HCT 32.8 (*)    MCH 24.5 (*)    All other components within normal limits  COMPREHENSIVE METABOLIC PANEL - Abnormal; Notable for the following components:   Potassium 3.2 (*)    CO2 20 (*)    Glucose, Bld 184 (*)    BUN 26 (*)    Creatinine, Ser 1.47 (*)    GFR, Estimated 55 (*)    All other components within normal limits  BRAIN NATRIURETIC PEPTIDE - Abnormal; Notable for the following components:   B Natriuretic Peptide 205.0 (*)    All other components within normal limits  TROPONIN I (HIGH SENSITIVITY) - Abnormal; Notable for the following components:   Troponin I (High Sensitivity) 52 (*)    All other components within normal limits  TROPONIN I (HIGH SENSITIVITY) - Abnormal; Notable for the following components:   Troponin I (High Sensitivity) 59 (*)  All other components within normal limits  MAGNESIUM  TROPONIN I (HIGH SENSITIVITY)    EKG Sinus rhythm with a rate of 68 bpm.  Normal axis.  Prolonged PR interval at 219 ms as well as a right bundle.  J-point elevations inferiorly to lead III and reciprocal depressions to 1 and aVL.  Similar morphology is comparison from 4/3 but seems to be little bit more pronounced tonight.  RADIOLOGY CXR interpreted by me without evidence of acute cardiopulmonary pathology.  Official radiology report(s): DG Chest Portable 1 View  Result Date: 09/07/2022 CLINICAL DATA:  Chest pain EXAM: PORTABLE CHEST 1 VIEW COMPARISON:  08/24/2022 FINDINGS: Prior CABG. Cardiomegaly. No confluent opacities, effusions or edema. No acute bony abnormality. IMPRESSION: Cardiomegaly.  No active disease. Electronically Signed   By: Charlett Nose M.D.   On: 09/07/2022 00:15    PROCEDURES and INTERVENTIONS:  .1-3 Lead EKG Interpretation  Performed by: Delton Prairie, MD Authorized by: Delton Prairie, MD     Interpretation: normal      ECG rate:  66   ECG rate assessment: normal     Rhythm: sinus rhythm     Ectopy: none     Conduction: normal     Medications  nitroGLYCERIN (NITROSTAT) SL tablet 0.4 mg (0.4 mg Sublingual Given 09/07/22 0015)  lactated ringers bolus 500 mL (0 mLs Intravenous Stopped 09/07/22 0154)  potassium chloride SA (KLOR-CON M) CR tablet 40 mEq (40 mEq Oral Given 09/07/22 0326)     IMPRESSION / MDM / ASSESSMENT AND PLAN / ED COURSE  I reviewed the triage vital signs and the nursing notes.  Differential diagnosis includes, but is not limited to, ACS, PTX, PNA, muscle strain/spasm, PE, dissection, anxiety, pleural effusion  {Patient presents with symptoms of an acute illness or injury that is potentially life-threatening.  62 year old male with known coronary disease and chronic chest discomfort presents with acute on chronic chest pain requiring medical observation admission.  EKG with some ischemic changes but no STEMI criteria, fairly similar morphology as previous EKGs.  Multiple repeats with similar morphology without progression or changes.  His pain improves with EMS aspirin and ED nitroglycerin but remains with some chest discomfort.  He is anticoagulated on Xarelto and has had previous CTA chest that are reassuring I do not see any indications for repeat CTA today.  No hypoxia and he minimizes his respiratory complaints.  Suspect a degree of CHF alongside his elevated BNP but his CXR is clear.  Troponins are chronically elevated and flat today.  Remainder of blood work is benign with CKD around baseline.  Hypokalemia is repleted orally.  Normal magnesium.  No leukocytosis.  We will consult medicine for admission considering his high risk status and continued chest discomfort  Clinical Course as of 09/07/22 0351  Sun Sep 07, 2022  0116 Repeat EKG is similar.  No STEMI criteria. [DS]  1610 I consult with hospitalist who agrees to admit [DS]    Clinical Course User Index [DS] Delton Prairie, MD      FINAL CLINICAL IMPRESSION(S) / ED DIAGNOSES   Final diagnoses:  Other chest pain     Rx / DC Orders   ED Discharge Orders     None        Note:  This document was prepared using Dragon voice recognition software and may include unintentional dictation errors.   Delton Prairie, MD 09/07/22 [phone]

## 2022-09-06 NOTE — ED Provider Notes (Incomplete)
   [facility] Provider Note    Event Date/Time   First MD Initiated Contact with Patient 09/06/22 2344     (approximate)   History   Chest Pain   HPI  Craig Reese is a 62 y.o. male who presents to the ED for evaluation of Chest Pain   Review medical DC summary from 4/9 he was admitted for chest pain.  History of diastolic dysfunction, CAD and one-vessel CABG as well as a bioprosthetic aortic valve in 2015.  Patient endocarditis.  A-fib on Xarelto.  CKD.  Had a left heart cath done at [facility] on 3/14 moderate multivessel disease, recommending medical management.  Echo 2 weeks ago with EF 55% and grade 2 diastolic dysfunction.,  CTA chest negative for PE.   Physical Exam   Triage Vital Signs: ED Triage Vitals  Enc Vitals Group     BP 09/06/22 2348 115/82     Pulse Rate 09/06/22 2348 68     Resp 09/06/22 2348 11     Temp 09/06/22 2348 98 F (36.7 C)     Temp Source 09/06/22 2348 Oral     SpO2 09/06/22 2348 98 %     Weight 09/06/22 2347 269 lb 9.6 oz (122.3 kg)     Height 09/06/22 2347  (1.778 m)     Head Circumference --      Peak Flow --      Pain Score --      Pain Loc --      Pain Edu? --      Excl. in GC? --     Most recent vital signs: Vitals:   09/06/22 2348  BP: 115/82  Pulse: 68  Resp: 11  Temp: 98 F (36.7 C)  SpO2: 98%    General: Awake, no distress. *** CV:  Good peripheral perfusion.  Resp:  Normal effort.  Abd:  No distention.  MSK:  No deformity noted.  Neuro:  No focal deficits appreciated. Other:     ED Results / Procedures / Treatments   Labs (all labs ordered are listed, but only abnormal results are displayed) Labs Reviewed  CBC WITH DIFFERENTIAL/PLATELET  COMPREHENSIVE METABOLIC PANEL  BRAIN NATRIURETIC PEPTIDE  MAGNESIUM  TROPONIN I (HIGH SENSITIVITY)    EKG Sinus rhythm with a rate of 68 bpm.  Normal axis.  Prolonged PR interval at 219 ms as well as a right bundle.  J-point elevations inferiorly  to lead III and reciprocal depressions to 1 and aVL.  Similar morphology is comparison from 4/3 but seems to be little bit more pronounced tonight.  RADIOLOGY ***  Official radiology report(s): No results found.  PROCEDURES and INTERVENTIONS:  Procedures  Medications - No data to display   IMPRESSION / MDM / ASSESSMENT AND PLAN / ED COURSE  I reviewed the triage vital signs and the nursing notes.  Differential diagnosis includes, but is not limited to, ***  {Patient presents with symptoms of an acute illness or injury that is potentially life-threatening.}      FINAL CLINICAL IMPRESSION(S) / ED DIAGNOSES   Final diagnoses:  None     Rx / DC Orders   ED Discharge Orders     None        Note:  This document was prepared using Dragon voice recognition software and may include unintentional dictation errors.

## 2022-09-06 NOTE — ED Triage Notes (Signed)
Pt to ED via [facility] with c/o severe chest pain. per EMS pt called his son and was c/o chest pain, then stopped responding on phone with son. per EMS on arrival pt with fingers interlocked across his chest, difficult to arouse. Per EMS pt recently hospitalized for a-fib, en route per EMS pt with sinus rhythm and earlier ropol and RBBB. Pt appears alert however difficult to arouse on arrival, appears to be staring off into space. Tachypneic en route 28-30. per EMS pt took 4 ASA en route.   99/50

## 2022-09-07 ENCOUNTER — Emergency Department: Payer: Self-pay

## 2022-09-07 DIAGNOSIS — R079 Chest pain, unspecified: Secondary | ICD-10-CM

## 2022-09-07 DIAGNOSIS — E1169 Type 2 diabetes mellitus with other specified complication: Secondary | ICD-10-CM

## 2022-09-07 DIAGNOSIS — I2510 Atherosclerotic heart disease of native coronary artery without angina pectoris: Secondary | ICD-10-CM

## 2022-09-07 DIAGNOSIS — R072 Precordial pain: Secondary | ICD-10-CM

## 2022-09-07 DIAGNOSIS — E785 Hyperlipidemia, unspecified: Secondary | ICD-10-CM

## 2022-09-07 DIAGNOSIS — I4819 Other persistent atrial fibrillation: Secondary | ICD-10-CM

## 2022-09-07 DIAGNOSIS — I480 Paroxysmal atrial fibrillation: Secondary | ICD-10-CM

## 2022-09-07 LAB — CBC WITH DIFFERENTIAL/PLATELET
Abs Immature Granulocytes: 0.04 10*3/uL (ref 0.00–0.07)
Basophils Absolute: 0.1 10*3/uL (ref 0.0–0.1)
Basophils Relative: 1 %
Eosinophils Absolute: 0.5 10*3/uL (ref 0.0–0.5)
Eosinophils Relative: 5 %
HCT: 32.8 % — ABNORMAL LOW (ref 39.0–52.0)
Hemoglobin: 10 g/dL — ABNORMAL LOW (ref 13.0–17.0)
Immature Granulocytes: 0 %
Lymphocytes Relative: 28 %
Lymphs Abs: 2.8 10*3/uL (ref 0.7–4.0)
MCH: 24.5 pg — ABNORMAL LOW (ref 26.0–34.0)
MCHC: 30.5 g/dL (ref 30.0–36.0)
MCV: 80.4 fL (ref 80.0–100.0)
Monocytes Absolute: 0.8 10*3/uL (ref 0.1–1.0)
Monocytes Relative: 8 %
Neutro Abs: 5.8 10*3/uL (ref 1.7–7.7)
Neutrophils Relative %: 58 %
Platelets: 308 10*3/uL (ref 150–400)
RBC: 4.08 MIL/uL — ABNORMAL LOW (ref 4.22–5.81)
RDW: 14.6 % (ref 11.5–15.5)
WBC: 9.9 10*3/uL (ref 4.0–10.5)
nRBC: 0 % (ref 0.0–0.2)

## 2022-09-07 LAB — LIPID PANEL
Cholesterol: 96 mg/dL (ref 0–200)
HDL: 24 mg/dL — ABNORMAL LOW (ref >40)
LDL Cholesterol: 34 mg/dL (ref 0–99)
Total CHOL/HDL Ratio: 4 RATIO
Triglycerides: 189 mg/dL — ABNORMAL HIGH (ref <150)
VLDL: 38 mg/dL (ref 0–40)

## 2022-09-07 LAB — COMPREHENSIVE METABOLIC PANEL
ALT: 20 U/L (ref 0–44)
AST: 23 U/L (ref 15–41)
Albumin: 3.7 g/dL (ref 3.5–5.0)
Alkaline Phosphatase: 96 U/L (ref 38–126)
Anion gap: 14 (ref 5–15)
BUN: 26 mg/dL — ABNORMAL HIGH (ref 6–20)
CO2: 20 mmol/L — ABNORMAL LOW (ref 22–32)
Calcium: 8.9 mg/dL (ref 8.9–10.3)
Chloride: 104 mmol/L (ref 98–111)
Creatinine, Ser: 1.47 mg/dL — ABNORMAL HIGH (ref 0.61–1.24)
GFR, Estimated: 55 mL/min — ABNORMAL LOW (ref >60)
Glucose, Bld: 184 mg/dL — ABNORMAL HIGH (ref 70–99)
Potassium: 3.2 mmol/L — ABNORMAL LOW (ref 3.5–5.1)
Sodium: 138 mmol/L (ref 135–145)
Total Bilirubin: 0.5 mg/dL (ref 0.3–1.2)
Total Protein: 7.9 g/dL (ref 6.5–8.1)

## 2022-09-07 LAB — TROPONIN I (HIGH SENSITIVITY)
Troponin I (High Sensitivity): 52 ng/L — ABNORMAL HIGH (ref <18)
Troponin I (High Sensitivity): 59 ng/L — ABNORMAL HIGH (ref <18)
Troponin I (High Sensitivity): 59 ng/L — ABNORMAL HIGH (ref <18)

## 2022-09-07 LAB — BASIC METABOLIC PANEL
Anion gap: 7 (ref 5–15)
BUN: 23 mg/dL — ABNORMAL HIGH (ref 6–20)
CO2: 21 mmol/L — ABNORMAL LOW (ref 22–32)
Calcium: 7 mg/dL — ABNORMAL LOW (ref 8.9–10.3)
Chloride: 112 mmol/L — ABNORMAL HIGH (ref 98–111)
Creatinine, Ser: 1.14 mg/dL (ref 0.61–1.24)
GFR, Estimated: >60 mL/min (ref >60)
Glucose, Bld: 122 mg/dL — ABNORMAL HIGH (ref 70–99)
Potassium: 2.7 mmol/L — CL (ref 3.5–5.1)
Sodium: 140 mmol/L (ref 135–145)

## 2022-09-07 LAB — MAGNESIUM
Magnesium: 1.6 mg/dL — ABNORMAL LOW (ref 1.7–2.4)
Magnesium: 1.9 mg/dL (ref 1.7–2.4)

## 2022-09-07 LAB — BRAIN NATRIURETIC PEPTIDE: B Natriuretic Peptide: 205 pg/mL — ABNORMAL HIGH (ref 0.0–100.0)

## 2022-09-07 LAB — GLUCOSE, CAPILLARY
Glucose-Capillary: 146 mg/dL — ABNORMAL HIGH (ref 70–99)
Glucose-Capillary: 188 mg/dL — ABNORMAL HIGH (ref 70–99)

## 2022-09-07 LAB — CBG MONITORING, ED
Glucose-Capillary: 129 mg/dL — ABNORMAL HIGH (ref 70–99)
Glucose-Capillary: 125 mg/dL — ABNORMAL HIGH (ref 70–99)
Glucose-Capillary: 118 mg/dL — ABNORMAL HIGH (ref 70–99)

## 2022-09-07 MED ORDER — METOCLOPRAMIDE HCL 5 MG/ML IJ SOLN: 10.0000 mg | Freq: Four times a day (QID) | INTRAMUSCULAR | Status: DC | PRN

## 2022-09-07 MED ORDER — MORPHINE SULFATE (PF) 2 MG/ML IV SOLN
INTRAVENOUS | Status: AC
  Administered 2022-09-07: 1 mg via INTRAVENOUS
  Filled 2022-09-07: qty 1

## 2022-09-07 MED ORDER — POTASSIUM CHLORIDE CRYS ER 20 MEQ PO TBCR
40.0000 meq | EXTENDED_RELEASE_TABLET | ORAL | Status: AC
  Administered 2022-09-07 (×2): 40 meq via ORAL
  Filled 2022-09-07 (×2): qty 2

## 2022-09-07 MED ORDER — POTASSIUM CHLORIDE CRYS ER 20 MEQ PO TBCR
40.0000 meq | EXTENDED_RELEASE_TABLET | Freq: Once | ORAL | Status: AC
  Administered 2022-09-07: 40 meq via ORAL
  Filled 2022-09-07: qty 2

## 2022-09-07 MED ORDER — NITROGLYCERIN 0.4 MG SL SUBL
0.4000 mg | SUBLINGUAL_TABLET | SUBLINGUAL | Status: DC | PRN
  Administered 2022-09-08 (×3): 0.4 mg via SUBLINGUAL
  Filled 2022-09-07 (×2): qty 1

## 2022-09-07 MED ORDER — ALPRAZOLAM 0.25 MG PO TABS
0.2500 mg | ORAL_TABLET | Freq: Two times a day (BID) | ORAL | Status: DC | PRN
  Administered 2022-09-07: 0.25 mg via ORAL
  Filled 2022-09-07: qty 1

## 2022-09-07 MED ORDER — SODIUM CHLORIDE 0.9 % IV SOLN: INTRAVENOUS | Status: DC

## 2022-09-07 MED ORDER — LIDOCAINE 5 % EX PTCH
1.0000 | MEDICATED_PATCH | CUTANEOUS | Status: DC
  Administered 2022-09-07 – 2022-09-08 (×2): 1 via TRANSDERMAL
  Filled 2022-09-07 (×2): qty 1

## 2022-09-07 MED ORDER — DICLOFENAC SODIUM 1 % EX GEL
2.0000 g | Freq: Four times a day (QID) | CUTANEOUS | Status: DC
  Administered 2022-09-07 – 2022-09-08 (×4): 2 g via TOPICAL
  Filled 2022-09-07: qty 100

## 2022-09-07 MED ORDER — KETOROLAC TROMETHAMINE 15 MG/ML IJ SOLN: 15.0000 mg | Freq: Four times a day (QID) | INTRAMUSCULAR | Status: AC | PRN

## 2022-09-07 MED ORDER — LEVOTHYROXINE SODIUM 150 MCG PO TABS
300.0000 ug | ORAL_TABLET | Freq: Every day | ORAL | Status: DC
  Administered 2022-09-07 – 2022-09-08 (×2): 300 ug via ORAL
  Filled 2022-09-07 (×2): qty 2
  Filled 2022-09-07 (×2): qty 6

## 2022-09-07 MED ORDER — GABAPENTIN 300 MG PO CAPS
300.0000 mg | ORAL_CAPSULE | Freq: Two times a day (BID) | ORAL | Status: DC
  Administered 2022-09-07 – 2022-09-08 (×3): 300 mg via ORAL
  Filled 2022-09-07 (×3): qty 1

## 2022-09-07 MED ORDER — KETOROLAC TROMETHAMINE 30 MG/ML IJ SOLN
INTRAMUSCULAR | Status: AC
  Administered 2022-09-07: 30 mg
  Filled 2022-09-07: qty 1

## 2022-09-07 MED ORDER — INSULIN ASPART 100 UNIT/ML IJ SOLN
0.0000 [IU] | INTRAMUSCULAR | Status: DC
  Administered 2022-09-07 (×2): 1 [IU] via SUBCUTANEOUS
  Filled 2022-09-07 (×2): qty 1

## 2022-09-07 MED ORDER — AMIODARONE HCL 200 MG PO TABS
200.0000 mg | ORAL_TABLET | Freq: Every day | ORAL | Status: DC
  Administered 2022-09-07: 200 mg via ORAL
  Filled 2022-09-07 (×2): qty 1

## 2022-09-07 MED ORDER — OXYCODONE HCL 5 MG PO TABS
5.0000 mg | ORAL_TABLET | ORAL | Status: DC | PRN
  Administered 2022-09-07: 5 mg via ORAL
  Filled 2022-09-07: qty 1

## 2022-09-07 MED ORDER — MORPHINE SULFATE (PF) 2 MG/ML IV SOLN: 1.0000 mg | Freq: Once | INTRAVENOUS | Status: AC

## 2022-09-07 MED ORDER — ACETAMINOPHEN 325 MG PO TABS
650.0000 mg | ORAL_TABLET | ORAL | Status: DC | PRN
  Administered 2022-09-07: 650 mg via ORAL
  Filled 2022-09-07: qty 2

## 2022-09-07 MED ORDER — INSULIN ASPART 100 UNIT/ML IJ SOLN
0.0000 [IU] | Freq: Three times a day (TID) | INTRAMUSCULAR | Status: DC
  Administered 2022-09-07: 2 [IU] via SUBCUTANEOUS
  Administered 2022-09-08: 1 [IU] via SUBCUTANEOUS
  Filled 2022-09-07 (×2): qty 1

## 2022-09-07 MED ORDER — FUROSEMIDE 40 MG PO TABS: 40.0000 mg | ORAL_TABLET | Freq: Every day | ORAL | Status: DC | PRN

## 2022-09-07 MED ORDER — ISOSORBIDE MONONITRATE ER 30 MG PO TB24
30.0000 mg | ORAL_TABLET | Freq: Every day | ORAL | Status: DC
  Administered 2022-09-07 – 2022-09-08 (×2): 30 mg via ORAL
  Filled 2022-09-07 (×2): qty 1

## 2022-09-07 MED ORDER — ATORVASTATIN CALCIUM 80 MG PO TABS
80.0000 mg | ORAL_TABLET | Freq: Every evening | ORAL | Status: DC
  Administered 2022-09-07: 80 mg via ORAL
  Filled 2022-09-07: qty 1

## 2022-09-07 MED ORDER — MORPHINE SULFATE (PF) 2 MG/ML IV SOLN: 2.0000 mg | INTRAVENOUS | Status: DC | PRN

## 2022-09-07 MED ORDER — AMIODARONE HCL 200 MG PO TABS: 400.0000 mg | ORAL_TABLET | Freq: Two times a day (BID) | ORAL | Status: DC

## 2022-09-07 MED ORDER — MAGNESIUM SULFATE 2 GM/50ML IV SOLN
2.0000 g | Freq: Once | INTRAVENOUS | Status: AC
  Administered 2022-09-07: 2 g via INTRAVENOUS
  Filled 2022-09-07: qty 50

## 2022-09-07 MED ORDER — LACTATED RINGERS IV BOLUS
500.0000 mL | Freq: Once | INTRAVENOUS | Status: AC
  Administered 2022-09-07: 500 mL via INTRAVENOUS

## 2022-09-07 MED ORDER — NITROGLYCERIN 0.4 MG SL SUBL
0.4000 mg | SUBLINGUAL_TABLET | SUBLINGUAL | Status: DC | PRN
  Administered 2022-09-07 (×4): 0.4 mg via SUBLINGUAL
  Filled 2022-09-07 (×2): qty 1

## 2022-09-07 MED ORDER — RIVAROXABAN 20 MG PO TABS
20.0000 mg | ORAL_TABLET | Freq: Every day | ORAL | Status: DC
  Administered 2022-09-07: 20 mg via ORAL
  Filled 2022-09-07 (×2): qty 1

## 2022-09-07 MED ORDER — ACETAMINOPHEN 500 MG PO TABS
1000.0000 mg | ORAL_TABLET | Freq: Three times a day (TID) | ORAL | Status: DC
  Administered 2022-09-07 – 2022-09-08 (×2): 1000 mg via ORAL
  Filled 2022-09-07 (×2): qty 2

## 2022-09-07 MED ORDER — POTASSIUM CHLORIDE 10 MEQ/100ML IV SOLN
10.0000 meq | INTRAVENOUS | Status: AC
  Administered 2022-09-07 (×2): 10 meq via INTRAVENOUS
  Filled 2022-09-07 (×2): qty 100

## 2022-09-07 MED ORDER — PANTOPRAZOLE SODIUM 20 MG PO TBEC
20.0000 mg | DELAYED_RELEASE_TABLET | Freq: Two times a day (BID) | ORAL | Status: DC
  Administered 2022-09-07 – 2022-09-08 (×3): 20 mg via ORAL
  Filled 2022-09-07 (×3): qty 1

## 2022-09-07 MED ORDER — TRAZODONE HCL 50 MG PO TABS: 25.0000 mg | ORAL_TABLET | Freq: Every evening | ORAL | Status: DC | PRN

## 2022-09-07 NOTE — Assessment & Plan Note (Signed)
-   The patient will be admitted to an observation cardiac telemetry bed. - Will follow serial troponins and EKGs. - The patient will be placed on aspirin as well as p.r.n. sublingual nitroglycerin and morphine sulfate for pain. - We will obtain a cardiology consult in a.m. for further cardiac risk stratification. - I notified Dr. Azucena Cecil about the patient

## 2022-09-07 NOTE — Progress Notes (Signed)
       CROSS COVER NOTE  NAME: Bynum Mccullars MRN: [id number] DOB : [birth year] ATTENDING PHYSICIAN: Marrion Coy, MD    Date of Service   09/07/2022   HPI/Events of Note   Report/Request Message received from RN reporting headache and chest pain.   Interventions   Assessment/Plan:  Pain secondary to Parotid Mass - Reviewed 07/16/22 Dr Curlene Dolphin with [facility] Medicine note in CareEverywhere Tylenol 1g TID + Gabapentin  q12H + Gabapentin  midday prn for worsening pain + oxycodone for breakthrough pain  Chronic Atypical Chest Pain Continue medical management per Cardiology Lidocaine patch Toradol PRN        To reach the provider On-Call:   7AM- 7PM see care teams to locate the attending and reach out to them via [email or web address] Password: [email or web address] 7PM-7AM contact night-coverage If you still have difficulty reaching the appropriate provider, please page the [facility] (Director on Call) for [facility] on amion for assistance  This document was prepared using Conservation officer, historic buildings and may include unintentional dictation errors.  Bishop Limbo DNP, MBA, FNP-BC, PMHNP-BC Nurse Practitioner [facility] Pager [phone]

## 2022-09-07 NOTE — H&P (Addendum)
[place]   PATIENT NAME: Craig Reese    MR#:  [id number]  DATE OF BIRTH:  Aug 08, 1960  DATE OF ADMISSION:  09/06/2022  PRIMARY CARE PHYSICIAN: Katrina Stack, MD   Patient is coming from: Home  REQUESTING/REFERRING PHYSICIAN: Delton Prairie, MD  CHIEF COMPLAINT:   Chief Complaint  Patient presents with   Chest Pain  The patient is Spanish-speaking only.  Translation was provided by [employer] software.  HISTORY OF PRESENT ILLNESS:  Cobin Cadavid is a 62 y.o. male with medical history significant for type 2 diabetes mellitus, hypothyroidism, coronary artery disease, chronic diastolic CHF and paroxysmal atrial fibrillation on Xarelto, who presented to the emergency room with diagnosis of midline chest pain felt as pressure and graded 10/10 in severity with associated dyspnea as well as palpitations without nausea or vomiting or diaphoresis.  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  No fever or chills.  No cough or wheezing or dyspnea.  No leg pain or edema or recent travels or surgeries.  No dysuria, oliguria or hematuria or flank pain.  No other bleeding diathesis.  ED Course: When the patient came to the ER, vital signs were within normal.  Labs revealed hypokalemia of 3.2 and a CO2 of 20 with a blood glucose of 184, BUN of 26 and creatinine 1.47 up from 1.34 on 08/26/2022 with otherwise unremarkable CMP.  High sensitive troponin was 52 and later 59 and 59.  CBC showed anemia better than previous levels with macrocytosis. EKG as reviewed by me : EKG showed sinus rhythm with a rate of 68 with prolonged PR interval and T wave inversion and true septally as well as laterally with slightly depressed ST segment laterally. Imaging: Portable chest ray showed cardiomegaly with no acute cardiopulmonary disease.  The patient was given 1 sublingual nitroglycerin and 40 mill equivalent p.o. potassium chloride as well as 500 mL IV lactated ringer bolus.  He was  pain-free during my interview.  He will be admitted to an observation cardiac telemetry bed for further evaluation and management. PAST MEDICAL HISTORY:   Past Medical History:  Diagnosis Date   AF (paroxysmal atrial fibrillation) 06/06/2018   CAD (coronary artery disease) 05/13/2018   Chronic diastolic CHF (congestive heart failure) 11/17/2018   Coronary artery disease    Diabetes mellitus type II, non insulin dependent 11/17/2018   Hypothyroidism 11/17/2018    PAST SURGICAL HISTORY:   Past Surgical History:  Procedure Laterality Date   CORONARY ARTERY BYPASS GRAFT     LEFT HEART CATH AND CORS/GRAFTS ANGIOGRAPHY N/A 05/17/2018   Procedure: LEFT HEART CATH AND CORS/GRAFTS ANGIOGRAPHY and possible PCI and stent;  Surgeon: Alwyn Pea, MD;  Location: [facility] INVASIVE CV LAB;  Service: Cardiovascular;  Laterality: N/A;    SOCIAL HISTORY:   Social History   Tobacco Use   Smoking status: Never   Smokeless tobacco: Never  Substance Use Topics   Alcohol use: Not Currently    FAMILY HISTORY:   Family History  Problem Relation Age of Onset   Heart disease Brother     DRUG ALLERGIES:  No Known Allergies  REVIEW OF SYSTEMS:   ROS As per history of present illness. All pertinent systems were reviewed above. Constitutional, HEENT, cardiovascular, respiratory, GI, GU, musculoskeletal, neuro, psychiatric, endocrine, integumentary and hematologic systems were reviewed and are otherwise negative/unremarkable except for positive findings mentioned above in the HPI.   MEDICATIONS AT HOME:  Prior to Admission medications   Medication Sig Start Date End Date Taking? Authorizing Provider  amiodarone (PACERONE) 200 MG tablet Take 1 tablet (200 mg total) by mouth daily. 09/03/22   Sunnie Nielsen, DO  amiodarone (PACERONE) 400 MG tablet Take 1 tablet (400 mg total) by mouth 2 (two) times daily for 7 days. 08/26/22 09/02/22  Sunnie Nielsen, DO  atorvastatin (LIPITOR) 80 MG tablet Take  80 mg by mouth every evening.    [provider]  furosemide (LASIX) 40 MG tablet Take 1 tablet (40 mg total) by mouth daily as needed. for up to 3 days for increased leg swelling, shortness of breath, weight gain 5+ lbs over 1-2 days. Seek medical care if these symptoms are not improving with increased dose. 08/26/22   Sunnie Nielsen, DO  gabapentin (NEURONTIN) 300 MG capsule Take 300 mg by mouth 2 (two) times daily.    [provider]  levothyroxine (SYNTHROID) 300 MCG tablet Take 300 mcg by mouth daily before breakfast. 05/10/22 05/10/23  [provider]  metFORMIN (GLUCOPHAGE) 1000 MG tablet Take 1,000 mg by mouth 2 (two) times daily with a meal.    [provider]  pantoprazole (PROTONIX) 20 MG tablet Take 1 tablet (20 mg total) by mouth 2 (two) times daily. 03/09/22 04/08/22  Tresa Moore, MD  rivaroxaban (XARELTO) 20 MG TABS tablet Take 1 tablet (20 mg total) by mouth daily. Patient taking differently: Take 20 mg by mouth daily with supper. 04/08/20   Alford Highland, MD      VITAL SIGNS:  Blood pressure (!) 102/58, pulse 62, temperature 98 F (36.7 C), temperature source Oral, resp. rate (!) 22, height  (1.778 m), weight 122.3 kg, SpO2 100 %.  PHYSICAL EXAMINATION:  Physical Exam  GENERAL:  62 y.o.-year-old Caucasian male patient lying in the bed with no acute distress.  EYES: Pupils equal, round, reactive to light and accommodation. No scleral icterus. Extraocular muscles intact.  HEENT: Head atraumatic, normocephalic. Oropharynx and nasopharynx clear.  NECK:  Supple, no jugular venous distention. No thyroid enlargement, no tenderness.  LUNGS: Normal breath sounds bilaterally, no wheezing, rales,rhonchi or crepitation. No use of accessory muscles of respiration.  CARDIOVASCULAR: Regular rate and rhythm, S1, S2 normal. No murmurs, rubs, or gallops.  ABDOMEN: Soft, nondistended, nontender. Bowel sounds present. No organomegaly or  mass.  EXTREMITIES: No pedal edema, cyanosis, or clubbing.  NEUROLOGIC: Cranial nerves II through XII are intact. Muscle strength 5/5 in all extremities. Sensation intact. Gait not checked.  PSYCHIATRIC: The patient is alert and oriented x 3.  Normal affect and good eye contact. SKIN: No obvious rash, lesion, or ulcer.   LABORATORY PANEL:   CBC Recent Labs  Lab 09/06/22 2348  WBC 9.9  HGB 10.0*  HCT 32.8*  PLT 308   ------------------------------------------------------------------------------------------------------------------  Chemistries  Recent Labs  Lab 09/06/22 2348  NA 138  K 3.2*  CL 104  CO2 20*  GLUCOSE 184*  BUN 26*  CREATININE 1.47*  CALCIUM 8.9  MG 1.9  AST 23  ALT 20  ALKPHOS 96  BILITOT 0.5   ------------------------------------------------------------------------------------------------------------------  Cardiac Enzymes No results for input(s): "TROPONINI" in the last 168 hours. ------------------------------------------------------------------------------------------------------------------  RADIOLOGY:  DG Chest Portable 1 View  Result Date: 09/07/2022 CLINICAL DATA:  Chest pain EXAM: PORTABLE CHEST 1 VIEW COMPARISON:  08/24/2022 FINDINGS: Prior CABG. Cardiomegaly. No confluent opacities, effusions or edema. No acute bony abnormality. IMPRESSION: Cardiomegaly.  No active disease. Electronically Signed   By: Charlett Nose  M.D.   On: 09/07/2022 00:15      IMPRESSION AND PLAN:  Assessment and Plan: * Chest pain - The patient will be admitted to an observation cardiac telemetry bed. - Will follow serial troponins and EKGs. - The patient will be placed on aspirin as well as p.r.n. sublingual nitroglycerin and morphine sulfate for pain. - We will obtain a cardiology consult in a.m. for further cardiac risk stratification. - I notified Dr. Azucena Cecil about the patient   Paroxysmal atrial fibrillation - We will continue  Xarelto.  Dyslipidemia - Will continue statin therapy. - Will check fasting lipids.  Type 2 diabetes mellitus with hyperlipidemia - The patient will be placed on supplement coverage with NovoLog. - We will hold off metformin. - We will continue Neurontin.       DVT prophylaxis: Lovenox.  Advanced Care Planning:  Code Status: full code.  Family Communication:  The plan of care was discussed in details with the patient (and family). I answered all questions. The patient agreed to proceed with the above mentioned plan. Further management will depend upon hospital course. Disposition Plan: Back to previous home environment Consults called: Cardiology All the records are reviewed and case discussed with ED provider.  Status is: Observation  I certify that at the time of admission, it is my clinical judgment that the patient will require  hospital care extending less than 2 midnights.                            Dispo: The patient is from: Home              Anticipated d/c is to: Home              Patient currently is not medically stable to d/c.              Difficult to place patient: No  Hannah Beat M.D on 09/07/2022 at 5:10 AM  [facility]   From 7 PM-7 AM, contact night-coverage [email or web address]  CC: Primary care physician; Katrina Stack, MD

## 2022-09-07 NOTE — Progress Notes (Signed)
Patient admitted to room 245 from ED due to chest pain.  A+Ox4 no discomfort at this time except for some Headache.  Tylenol administered. Patient is Spanish speaking only and will need medical interpreter.  Care RN speaks Spanish and is also a medical interpreter. Will continue to monitor and assess per Plan of Care.

## 2022-09-07 NOTE — Consult Note (Signed)
Cardiology Consultation:   Patient ID: Craig Reese; [id number]; December 17, 1960   Admit date: 09/06/2022 Date of Consult: 09/07/2022  Primary Care Provider: Katrina Stack, MD Primary Cardiologist: [facility] Primary Electrophysiologist:  None   Patient Profile:   Craig Reese is a 62 y.o. male with a hx of CAD status post one-vessel CABG with chronic atypical chest pain requiring multiple ischemic evaluations with bioprosthetic aortic valve replacement in 2015 for severe aortic insufficiency in the setting of aortic valve endocarditis, HFpEF, persistent atrial fibrillation on rivaroxaban, stable thoracic aortic aneurysm measuring 4.1 cm on 08/24/2022, DM2, HTN, HLD, CKD stage III, normocytic anemia, and untreated sleep apnea who is being seen today for the evaluation of chronic atypical chest pain at the request of Dr. Arville Care.  History of Present Illness:   Craig Reese has a known history of chronic atypical chest pain requiring frequent ischemic evaluations.  He has been evaluated by multiple cardiology groups locally.  LHC, performed at [facility] on 07/31/2022 showed no significant changes when compared to prior angiography in 2022 with moderate proximal disease and SVG to LAD.  Medical therapy was recommended.   This is his third admission this month for chest pain and palpitations in the setting of A-fib with RVR with symptomatic improvement following spontaneous conversion to sinus rhythm.  Prior to admission earlier this month notable for mildly elevated and flat trending high-sensitivity troponin consistent with prior readings ranging from the 50s to 90s.  CTA chest without evidence of pulmonary embolism with stable 4.1 cm thoracic aortic aneurysm noted.  Echo on 08/21/2022 showed an EF of 55 to 60%, no regional wall motion abnormalities, severe LVH, grade 2 diastolic dysfunction, mildly reduced RV systolic function with normal ventricular cavity size and normal PASP, severely dilated left atrium,  moderately dilated right atrium, mild mitral regurgitation, prior repair/replacement of the aortic valve with moderate stenosis with a mean gradient of 29.5 mmHg, and a mildly dilated ascending aorta.  Given LHC performed 1 month ago, there were no plans for ischemic evaluation with elevated troponin reflecting supply/demand mismatch ischemia in the setting of A-fib with RVR.  It was recommended the patient be maintained on amiodarone with an acceptable QTc of 550 ms with a known right bundle branch block, and for the patient to follow-up with his cardiology team at [facility] with consideration for EP consultation to discuss long-term antiarrhythmic therapy.   He presented to [facility] on 09/06/2022 recurrent palpitations and chest pain similar to prior episodes.  Patient was sitting down when episode occurred and was without diaphoresis, nausea, vomiting, dizziness, presyncope, or syncope.  Symptoms lasted for several minutes and did not resolve, therefore he presented to the hospital via EMS.  Upon EMS arrival, he was in sinus rhythm with known right bundle branch block and difficult to arouse.  He was given 4 aspirin en route.  High-sensitivity troponin mildly elevated and flat trending at 59 x 2 which is improved from readings earlier this month.  BNP 205, improved from value obtained 2 weeks prior.  Other notable labs include hemoglobin 10.0, potassium 3.2, BUN 26, serum creatinine 1.47.  EKG showed sinus rhythm, 68 bpm, first-degree AV block, RBBB with stable QTc.  Chest x-ray with cardiomegaly without active disease.  In the ED, the patient was given lactated Ringer's and SL NTG x 1.  Upon admission, patient was continued on PTA medications and cardiology was asked to evaluate the patient's chest pain.  Currently chest pain-free.   Past Medical History:  Diagnosis Date  AF (paroxysmal atrial fibrillation) 06/06/2018   CAD (coronary artery disease) 05/13/2018   Chronic diastolic CHF (congestive heart failure)  11/17/2018   Coronary artery disease    Diabetes mellitus type II, non insulin dependent 11/17/2018   Hypothyroidism 11/17/2018    Past Surgical History:  Procedure Laterality Date   CORONARY ARTERY BYPASS GRAFT     LEFT HEART CATH AND CORS/GRAFTS ANGIOGRAPHY N/A 05/17/2018   Procedure: LEFT HEART CATH AND CORS/GRAFTS ANGIOGRAPHY and possible PCI and stent;  Surgeon: Alwyn Pea, MD;  Location: [facility] INVASIVE CV LAB;  Service: Cardiovascular;  Laterality: N/A;     Home Meds: Prior to Admission medications   Medication Sig Start Date End Date Taking? Authorizing Provider  amiodarone (PACERONE) 200 MG tablet Take 1 tablet (200 mg total) by mouth daily. 09/03/22  Yes Sunnie Nielsen, DO  atorvastatin (LIPITOR) 80 MG tablet Take 80 mg by mouth every evening.   Yes [provider]  furosemide (LASIX) 40 MG tablet Take 1 tablet (40 mg total) by mouth daily as needed. for up to 3 days for increased leg swelling, shortness of breath, weight gain 5+ lbs over 1-2 days. Seek medical care if these symptoms are not improving with increased dose. 08/26/22  Yes Sunnie Nielsen, DO  gabapentin (NEURONTIN) 300 MG capsule Take 300 mg by mouth 2 (two) times daily.   Yes [provider]  levothyroxine (SYNTHROID) 300 MCG tablet Take 300 mcg by mouth daily before breakfast. 05/10/22 05/10/23 Yes [provider]  metFORMIN (GLUCOPHAGE) 1000 MG tablet Take 1,000 mg by mouth 2 (two) times daily with a meal.   Yes [provider]  rivaroxaban (XARELTO) 20 MG TABS tablet Take 1 tablet (20 mg total) by mouth daily. Patient taking differently: Take 20 mg by mouth daily with supper. 04/08/20  Yes Alford Highland, MD  amiodarone (PACERONE) 400 MG tablet Take 1 tablet (400 mg total) by mouth 2 (two) times daily for 7 days. 08/26/22 09/02/22  Sunnie Nielsen, DO  pantoprazole (PROTONIX) 20 MG tablet Take 1 tablet (20 mg total) by mouth 2 (two) times daily. 03/09/22 04/08/22   Tresa Moore, MD    Inpatient Medications: Scheduled Meds:  amiodarone  200 mg Oral Daily   atorvastatin  80 mg Oral QPM   gabapentin  300 mg Oral BID   insulin aspart  0-9 Units Subcutaneous Q4H   levothyroxine  300 mcg Oral Q0600   pantoprazole  20 mg Oral BID   rivaroxaban  20 mg Oral Q supper   Continuous Infusions:  sodium chloride 100 mL/hr at 09/07/22 0713   PRN Meds: acetaminophen, ALPRAZolam, furosemide, metoCLOPramide (REGLAN) injection, morphine injection, nitroGLYCERIN, traZODone  Allergies:  No Known Allergies  Social History:   Social History   Socioeconomic History   Marital status: Single    Spouse name: Not on file   Number of children: Not on file   Years of education: Not on file   Highest education level: Not on file  Occupational History   Not on file  Tobacco Use   Smoking status: Never   Smokeless tobacco: Never  Vaping Use   Vaping Use: Never used  Substance and Sexual Activity   Alcohol use: Not Currently   Drug use: Never   Sexual activity: Not Currently  Other Topics Concern   Not on file  Social History Narrative   Not on file   Social Determinants of Health   Financial Resource Strain: Medium Risk (05/13/2018)   Overall Financial  Resource Strain (CARDIA)    Difficulty of Paying Living Expenses: Somewhat hard  Food Insecurity: Unknown (05/13/2018)   Hunger Vital Sign    Worried About Running Out of Food in the Last Year: Patient declined    Ran Out of Food in the Last Year: Patient declined  Transportation Needs: Unknown (05/13/2018)   PRAPARE - Administrator, Civil Service (Medical): Patient declined    Lack of Transportation (Non-Medical): Patient declined  Physical Activity: Unknown (05/13/2018)   Exercise Vital Sign    Days of Exercise per Week: Patient declined    Minutes of Exercise per Session: Patient declined  Stress: Stress Concern Present (05/13/2018)   [employer] of Occupational Health -  Occupational Stress Questionnaire    Feeling of Stress : To some extent  Social Connections: Not on file  Intimate Partner Violence: Not on file     Family History:   Family History  Problem Relation Age of Onset   Heart disease Brother     ROS:  Review of Systems  Constitutional:  Positive for malaise/fatigue. Negative for chills, diaphoresis, fever and weight loss.  HENT:  Negative for congestion.   Eyes:  Negative for discharge and redness.  Respiratory:  Positive for shortness of breath. Negative for cough, hemoptysis, sputum production and wheezing.   Cardiovascular:  Positive for chest pain and palpitations. Negative for orthopnea, claudication, leg swelling and PND.  Gastrointestinal:  Negative for abdominal pain, blood in stool, heartburn, melena, nausea and vomiting.  Genitourinary:  Negative for hematuria.  Musculoskeletal:  Negative for falls and myalgias.  Skin:  Negative for rash.  Neurological:  Negative for dizziness, tingling, tremors, sensory change, speech change, focal weakness, loss of consciousness and weakness.  Endo/Heme/Allergies:  Does not bruise/bleed easily.  Psychiatric/Behavioral:  Negative for substance abuse. The patient is not nervous/anxious.   All other systems reviewed and are negative.     Physical Exam/Data:   Vitals:   09/07/22 0230 09/07/22 0300 09/07/22 0330 09/07/22 0715  BP: (!) 106/55 (!) 105/53 (!) 102/58 122/70  Pulse: 62 61 62 (!) 58  Resp: 15 16 (!) 22 14  Temp:    (!) 97.5 F (36.4 C)  TempSrc:    Oral  SpO2: 95% 95% 100% 98%  Weight:      Height:        Intake/Output Summary (Last 24 hours) at 09/07/2022 0757 Last data filed at 09/07/2022 0154 Gross per 24 hour  Intake 500 ml  Output --  Net 500 ml   Filed Weights   09/06/22 2347  Weight: 122.3 kg   Body mass index is 38.68 kg/m.   Physical Exam: General: Well developed, well nourished, in no acute distress. Head: Normocephalic, atraumatic, sclera non-icteric,  no xanthomas, nares without discharge.  Neck: Negative for carotid bruits. JVD not elevated. Lungs: Clear bilaterally to auscultation without wheezes, rales, or rhonchi. Breathing is unlabored. Heart: RRR with S1 S2. II/VI systolic murmurs, no rubs, or gallops appreciated. Abdomen: Soft, non-tender, non-distended with normoactive bowel sounds. No hepatomegaly. No rebound/guarding. No obvious abdominal masses. Msk:  Strength and tone appear normal for age. Extremities: No clubbing or cyanosis. No edema. Distal pedal pulses are 2+ and equal bilaterally. Neuro: Alert and oriented X 3. No facial asymmetry. No focal deficit. Moves all extremities spontaneously. Psych:  Responds to questions appropriately with a normal affect.   EKG:  The EKG was personally reviewed and demonstrates: sinus rhythm, 68 bpm, first-degree AV block, RBBB with stable QTc  Telemetry:  Telemetry was personally reviewed and demonstrates: Sinus rhythm with bundle branch block  Weights: Filed Weights   09/06/22 2347  Weight: 122.3 kg    Relevant CV Studies:  2D echo 08/21/2022: 1. Left ventricular ejection fraction, by estimation, is 55 to 60%. The  left ventricle has normal function. The left ventricle has no regional  wall motion abnormalities. There is severe left ventricular hypertrophy.  Left ventricular diastolic parameters   are consistent with Grade II diastolic dysfunction (pseudonormalization).   2. Right ventricular systolic function is mildly reduced. The right  ventricular size is normal. There is normal pulmonary artery systolic  pressure.   3. Left atrial size was severely dilated.   4. Right atrial size was moderately dilated.   5. The mitral valve is normal in structure. Mild mitral valve  regurgitation. No evidence of mitral stenosis.   6. The aortic valve has been repaired/replaced. Aortic valve  regurgitation is not visualized. Moderate aortic valve stenosis. Aortic  valve area, by VTI measures  1.24 cm. Aortic valve mean gradient measures  29.5 mmHg.   7. Aortic dilatation noted. There is mild dilatation of the ascending  aorta, measuring 40 mm.   8. The inferior vena cava is normal in size with <50% respiratory  variability, suggesting right atrial pressure of 8 mmHg.  __________  [facility] 07/31/2022 [facility]):  Impression/Recommendations:  1. His CAD is relatively unchanged from his cardiac cath in 2022.  He has  a proximal SVG disease and LAD disease that is moderate, and unchanged  from his previous cath.   2. His symptoms of chest discomfort and dyspnea may be related to OSA.   Consider sleep study.  3. Recommend medical management of moderate disease at this time.  __________   2D echo 07/16/2022 [facility]):  Summary   1. The left ventricle is normal in size with moderately increased wall  thickness.   2. The left ventricular systolic function is normal, LVEF is visually  estimated at > 55%.    3. Aortic valve replacement (26 mm bioprosthetic, implantation date:  10/12/2013).   4. Aortic valve Doppler indices are consistent with normal prosthetic valve  function.   5. The left atrium is mildly dilated in size.   6. The right ventricle is normal in size, with normal systolic function.  __________   [facility] 08/23/2020 [facility]):  Findings:  1. Diffuse moderate CAD without focal high grade stenosis.  2. Patent SVG to OM with 40-50% proximal stenosis.   Recommendations:  1. Aggressive secondary prevention and medical therapy.   2. Follow up with primary cardiologist.    Laboratory Data:  Chemistry Recent Labs  Lab 09/06/22 2348  NA 138  K 3.2*  CL 104  CO2 20*  GLUCOSE 184*  BUN 26*  CREATININE 1.47*  CALCIUM 8.9  GFRNONAA 55*  ANIONGAP 14    Recent Labs  Lab 09/06/22 2348  PROT 7.9  ALBUMIN 3.7  AST 23  ALT 20  ALKPHOS 96  BILITOT 0.5   Hematology Recent Labs  Lab 09/06/22 2348  WBC 9.9  RBC 4.08*  HGB 10.0*  HCT 32.8*  MCV 80.4  MCH 24.5*  MCHC 30.5   RDW 14.6  PLT 308   Cardiac EnzymesNo results for input(s): "TROPONINI" in the last 168 hours. No results for input(s): "TROPIPOC" in the last 168 hours.  BNP Recent Labs  Lab 09/06/22 2348  BNP 205.0*    DDimer No results for input(s): "DDIMER" in the last 168  hours.  Radiology/Studies:  DG Chest Portable 1 View  Result Date: 09/07/2022 IMPRESSION: Cardiomegaly.  No active disease. Electronically Signed   By: Charlett Nose M.D.   On: 09/07/2022 00:15    Assessment and Plan:   CAD status post CABG with chronic atypical chest pain with demand ischemia:  -Currently, without symptoms of angina or cardiac decompensation -Mildly elevated high-sensitivity troponin is flat trending and improved from prior, not consistent with ACS  -Recent LHC performed on 07/31/2022 showed no obstructive disease  -Continue risk factor modification and secondary prevention  -I do wonder if some of the patient's symptoms are related to episodes of breakthrough A-fib with RVR in the context of severe LVH and moderate aortic stenosis  -Ongoing rhythm control strategy will be particularly important for him  -Echo just earlier this month showed preserved LV systolic function, no indication to repeat at this time  -PTA rivaroxaban in place of aspirin given history of persistent A-fib  -No plans for inpatient ischemic evaluation   Persistent A-fib:  -Maintaining sinus rhythm  -Continue amiodarone 200 mg daily, although this is not a good long-term option given the patient's young age  -Recommend the patient follow-up with his cardiology [facility] team with consideration for referral to EP for long-term management strategy   Status post bioprosthetic aortic valve replacement:  -Echo earlier this month showed moderate aortic stenosis which should be repeated in 6 months at the discretion of his [facility] cardiology team  -SBE prophylaxis is indicated for all dental procedures   HFpEF:  -Euvolemic, compensated  -BNP  improved from value earlier this month  -Not requiring standing loop diuretic  -Consider addition of MRA and SGLT2 inhibitor, this will be deferred to the patient's primary cardiology team   Thoracic aortic aneurysm:  -Stable, measuring 4.1 cm on CTA in 08/2022  -Follow-up with primary cardiology team at [facility] for ongoing surveillance  -Optimal blood pressure control recommended   HTN:  -Blood pressure well-controlled   HLD:  -LDL 34  -PTA atorvastatin   Untreated sleep apnea:  -Suspect this is contributing significantly to his A-fib burden and recurrent hypoxic events overnight as well as somnolence -Reports an intolerance to CPAP  -Would recommend patient follow-up with primary cardiology team at [facility] with consideration for ENT referral to discuss alternative management options   Hypokalemia:  -Patient has a history of low potassium, possibly contributing to paroxysms of A-fib  -Repletion has been given in the ED   CKD stage III:  -Renal function stable  -Avoid nephrotoxic agents   Normocytic anemia:  -Stable  -Uncertain etiology  -Patient should follow-up with PCP in the outpatient   Language barrier: -Video medical interpreter utilized for today's consultation     For questions or updates, please contact [facility] Please consult [email or web address] for contact info under Cardiology/STEMI.   Signed, Eula Listen, PA-C [facility] Pager: [phone] 09/07/2022, 7:57 AM

## 2022-09-07 NOTE — Hospital Course (Signed)
Craig Reese is a 62 y.o. male with medical history significant for type 2 diabetes mellitus, hypothyroidism, coronary artery disease, chronic diastolic CHF and paroxysmal atrial fibrillation on Xarelto, who presented to the emergency room with diagnosis of midline chest pain.  He also had a bypass grafting in the past, has a scar in the mid chest. He states that his chest pain has been happening for the last 5 years, intermittently.  With each episode lasted about 1 hour.  Echocardiogram performed on 08/21/2022 showed severe left ventricular hypertrophy, with normal ejection fraction.  No evidence of pericarditis.  Recent heart cath on 3/14 showed moderate disease in proximal SVG graft, LAD. This is the third time he was admitted to [facility] in 3 weeks.  He was also admitted to [facility]. Patient has been seen by cardiology, no plan for any additional workup.

## 2022-09-07 NOTE — Progress Notes (Signed)
Progress Note   Patient: Craig Reese RUE:[id number] DOB: 10-Jul-1960 DOA: 09/06/2022     0 DOS: the patient was seen and examined on 09/07/2022   Brief hospital course: Craig Reese is a 62 y.o. male with medical history significant for type 2 diabetes mellitus, hypothyroidism, coronary artery disease, chronic diastolic CHF and paroxysmal atrial fibrillation on Xarelto, who presented to the emergency room with diagnosis of midline chest pain.  He also had a bypass grafting in the past, has a scar in the mid chest. He states that his chest pain has been happening for the last 5 years, intermittently.  With each episode lasted about 1 hour.  Echocardiogram performed on 08/21/2022 showed severe left ventricular hypertrophy, with normal ejection fraction.  No evidence of pericarditis.  Recent heart cath on 3/14 showed moderate disease in proximal SVG graft, LAD. This is the third time he was admitted to [facility] in 3 weeks.  He was also admitted to [facility]. Patient has been seen by cardiology, no plan for any additional workup.   Principal Problem:   Chest pain Active Problems:   Paroxysmal atrial fibrillation   Type 2 diabetes mellitus with hyperlipidemia   Dyslipidemia   CKD stage 3a, GFR 45-59 ml/min   Hypokalemia   Hypothyroidism   Severe obstructive sleep apnea   Obesity (BMI 30-39.9)   Chronic diastolic CHF (congestive heart failure)   Assessment and Plan:  * Chest pain Coronary artery disease with single bypass grafting. Mild elevation of troponin. Etiology of chest pain is still unclear.  Echocardiogram did not show any evidence of pericarditis. Echocardiogram did not show severe left ventricular hypertrophy, with a moderate aortic stenosis.  Aortic stenosis could be the source of chest pain.  Patient has significant bradycardia, I would not consider any possibility of beta-blocker versus calcium channel blocker at this time. Another consideration is nonobstructive coronary  disease, will add Imdur to her regimen. There is another consideration that the pain could be musculoskeletal in nature, secondary to sternal incision for surgery.  I will add topical diclofenac as well as Lidoderm patch. I will try to keep patient another day and treat the symptoms to prevent further admission.   Paroxysmal atrial fibrillation On Xarelto and amiodarone.  Hypokalemia. Hypomagnesemia Acute kidney injury. Appears secondary to Lasix.  Replete potassium and magnesium, renal function has normalized.  Dyslipidemia - Will continue statin therapy. - Will check fasting lipids.  Type 2 diabetes mellitus with hyperlipidemia - The patient will be placed on supplement coverage with NovoLog. - We will hold off metformin. - We will continue Neurontin.      Subjective:  Patient still complaining of chest pain, with occasional shortness of breath.  Physical Exam: Vitals:   09/07/22 0830 09/07/22 0900 09/07/22 0930 09/07/22 1030  BP: 116/67 121/67 128/66 128/70  Pulse: (!) 54 60 (!) 58 (!) 58  Resp: Temp:      TempSrc:      SpO2: 99% 100% 100% 100%  Weight:      Height:       General exam: Appears calm and comfortable, obese Respiratory system: Clear to auscultation. Respiratory effort normal. Cardiovascular system: S1 & S2 heard, RRR. No JVD, murmurs, rubs, gallops or clicks. No pedal edema.  Gastrointestinal system: Abdomen is nondistended, soft and nontender. No organomegaly or masses felt. Normal bowel sounds heard. Central nervous system: Alert and oriented. No focal neurological deficits. Extremities: Symmetric 5 x 5 power. Skin: No rashes, lesions or ulcers  Psychiatry: Judgement and insight appear normal. Mood & affect appropriate.  Significant chest wall tenderness in the sternum.  Data Reviewed:  Reviewed lab results, prior echo results, prior heart cath results.  Prior CT scan results.  Family Communication: None  Disposition: Status is:  Observation      Time spent: No charge  minutes  Author: Marrion Coy, MD 09/07/2022 11:49 AM  For on call review [email or web address].

## 2022-09-07 NOTE — Assessment & Plan Note (Signed)
-   We will continue Xarelto. ?

## 2022-09-07 NOTE — Plan of Care (Signed)

## 2022-09-07 NOTE — Assessment & Plan Note (Signed)
-   The patient will be placed on supplement coverage with NovoLog. - We will hold off metformin. - We will continue Neurontin. 

## 2022-09-07 NOTE — ED Notes (Signed)
Consulting MD at bedside assessing pt.

## 2022-09-07 NOTE — Assessment & Plan Note (Signed)
-   Will continue statin therapy. - Will check fasting lipids.

## 2022-09-08 DIAGNOSIS — R0789 Other chest pain: Secondary | ICD-10-CM

## 2022-09-08 LAB — BASIC METABOLIC PANEL
Anion gap: 8 (ref 5–15)
BUN: 29 mg/dL — ABNORMAL HIGH (ref 6–20)
CO2: 24 mmol/L (ref 22–32)
Calcium: 8.5 mg/dL — ABNORMAL LOW (ref 8.9–10.3)
Chloride: 106 mmol/L (ref 98–111)
Creatinine, Ser: 1.5 mg/dL — ABNORMAL HIGH (ref 0.61–1.24)
GFR, Estimated: 53 mL/min — ABNORMAL LOW (ref >60)
Glucose, Bld: 166 mg/dL — ABNORMAL HIGH (ref 70–99)
Potassium: 3.8 mmol/L (ref 3.5–5.1)
Sodium: 138 mmol/L (ref 135–145)

## 2022-09-08 LAB — MAGNESIUM: Magnesium: 2.5 mg/dL — ABNORMAL HIGH (ref 1.7–2.4)

## 2022-09-08 LAB — PHOSPHORUS: Phosphorus: 5.2 mg/dL — ABNORMAL HIGH (ref 2.5–4.6)

## 2022-09-08 LAB — GLUCOSE, CAPILLARY: Glucose-Capillary: 140 mg/dL — ABNORMAL HIGH (ref 70–99)

## 2022-09-08 MED ORDER — ISOSORBIDE MONONITRATE ER 30 MG PO TB24: 30.0000 mg | ORAL_TABLET | Freq: Every day | ORAL | 0 refills | Status: DC

## 2022-09-08 MED ORDER — DICLOFENAC SODIUM 1 % EX GEL: 2.0000 g | Freq: Four times a day (QID) | CUTANEOUS | 0 refills | Status: AC | PRN

## 2022-09-08 MED ORDER — LIDOCAINE 5 % EX OINT: 1.0000 | TOPICAL_OINTMENT | Freq: Two times a day (BID) | CUTANEOUS | 0 refills | Status: DC | PRN

## 2022-09-08 NOTE — Discharge Summary (Signed)
Physician Discharge Summary   Patient: Craig Reese MRN: [id number] DOB: [birth year]  Admit date:     09/06/2022  Discharge date: 09/08/22  Discharge Physician: Marrion Coy   PCP: Katrina Stack, MD   Recommendations at discharge:   Follow-up with PCP in 1 week. Follow-up with your cardiologist as previous scheduled.  Discharge Diagnoses: Principal Problem:   Chest pain Active Problems:   Paroxysmal atrial fibrillation   Type 2 diabetes mellitus with hyperlipidemia   Dyslipidemia   CKD stage 3a, GFR 45-59 ml/min   Hypokalemia   Hypothyroidism   Severe obstructive sleep apnea   Obesity (BMI 30-39.9)   Chronic diastolic CHF (congestive heart failure)   Persistent atrial fibrillation Morbid obesity with BMI 38.68 with comorbidities. Resolved Problems:   * No resolved hospital problems. * Metabolic acidosis. Hypokalemia. Hypomagnesemia. Hospital Course: Craig Reese is a 62 y.o. male with medical history significant for type 2 diabetes mellitus, hypothyroidism, coronary artery disease, chronic diastolic CHF and paroxysmal atrial fibrillation on Xarelto, who presented to the emergency room with diagnosis of midline chest pain.  He also had a bypass grafting in the past, has a scar in the mid chest. He states that his chest pain has been happening for the last 5 years, intermittently.  With each episode lasted about 1 hour.  Echocardiogram performed on 08/21/2022 showed severe left ventricular hypertrophy, with normal ejection fraction.  No evidence of pericarditis.  Recent heart cath on 3/14 showed moderate disease in proximal SVG graft, LAD. This is the third time he was admitted to [facility] in 3 weeks.  He was also admitted to [facility]. Patient has been seen by cardiology, no plan for any additional workup.  Assessment and Plan: * Chest pain Coronary artery disease with single bypass grafting. Mild elevation of troponin. Etiology of chest pain is still unclear.   Echocardiogram did not show any evidence of pericarditis, but showed severe left ventricular hypertrophy, with a moderate aortic stenosis.  Aortic stenosis could be the source of chest pain.  Patient has significant bradycardia, I would not consider beta-blocker or calcium channel blocker at this time. Another consideration is nonobstructive coronary disease, will add Imdur to her regimen. There is another consideration that the pain could be musculoskeletal in nature, secondary to sternal incision for surgery.  I will add topical diclofenac as well as Lidocaine.  Due to chronic kidney disease, I will avoid oral NSAIDs.  Paroxysmal atrial fibrillation On Xarelto and amiodarone.   Hypokalemia. Hypomagnesemia Acute kidney injury ruled out Chronic kidney disease stage IIIa. Potassium magnesium has normalized.  Based on prior lab results, patient does not have acute kidney injury, he has chronic kidney disease stage IIIa.  Dyslipidemia Continue home dose statin treatment.  LDL 34, VLDL 189.   Type 2 diabetes mellitus with hyperlipidemia Resume home treatment.  Follow-up with PCP in 1 week.       Consultants: Cardiology Procedures performed: None  Disposition: Home Diet recommendation:  Discharge Diet Orders (From admission, onward)     Start     Ordered   09/08/22 0000  Diet - low sodium heart healthy        09/08/22 0916           Cardiac diet DISCHARGE MEDICATION: Allergies as of 09/08/2022   No Known Allergies      Medication List     TAKE these medications    amiodarone 200 MG tablet Commonly known as: Pacerone Take 1 tablet (200 mg total) by mouth  daily. What changed: Another medication with the same name was removed. Continue taking this medication, and follow the directions you see here.   atorvastatin 80 MG tablet Commonly known as: LIPITOR Take 80 mg by mouth every evening.   diclofenac Sodium 1 % Gel Commonly known as: VOLTAREN Apply 2 g topically 4  (four) times daily as needed.   furosemide 40 MG tablet Commonly known as: LASIX Take 1 tablet (40 mg total) by mouth daily as needed. for up to 3 days for increased leg swelling, shortness of breath, weight gain 5+ lbs over 1-2 days. Seek medical care if these symptoms are not improving with increased dose.   gabapentin 300 MG capsule Commonly known as: NEURONTIN Take 300 mg by mouth 2 (two) times daily.   isosorbide mononitrate 30 MG 24 hr tablet Commonly known as: IMDUR Take 1 tablet (30 mg total) by mouth daily.   levothyroxine 300 MCG tablet Commonly known as: SYNTHROID Take 300 mcg by mouth daily before breakfast.   lidocaine 5 % ointment Commonly known as: XYLOCAINE Apply 1 Application topically 2 (two) times daily as needed. To chaest   metFORMIN 1000 MG tablet Commonly known as: GLUCOPHAGE Take 1,000 mg by mouth 2 (two) times daily with a meal.   pantoprazole 20 MG tablet Commonly known as: PROTONIX Take 1 tablet (20 mg total) by mouth 2 (two) times daily.   rivaroxaban 20 MG Tabs tablet Commonly known as: XARELTO Take 1 tablet (20 mg total) by mouth daily. What changed: when to take this        Follow-up Information     Katrina Stack, MD Follow up in 1 week(s).   Specialty: Internal Medicine Contact information: [address] [facility] Internal Medicine at [facility] [place] [address] [phone]                Discharge Exam: Ceasar Mons Weights   09/06/22 2347  Weight: 122.3 kg   General exam: Appears calm and comfortable  Respiratory system: Clear to auscultation. Respiratory effort normal. Cardiovascular system: S1 & S2 heard, RRR. No JVD, murmurs, rubs, gallops or clicks. No pedal edema. Gastrointestinal system: Abdomen is nondistended, soft and nontender. No organomegaly or masses felt. Normal bowel sounds heard. Central nervous system: Alert and oriented. No focal neurological deficits. Extremities: Symmetric 5 x 5  power. Skin: No rashes, lesions or ulcers Psychiatry: Judgement and insight appear normal. Mood & affect appropriate.  Chest wall tenderness.  Condition at discharge: good  The results of significant diagnostics from this hospitalization (including imaging, microbiology, ancillary and laboratory) are listed below for reference.   Imaging Studies: DG Chest Portable 1 View  Result Date: 09/07/2022 CLINICAL DATA:  Chest pain EXAM: PORTABLE CHEST 1 VIEW COMPARISON:  08/24/2022 FINDINGS: Prior CABG. Cardiomegaly. No confluent opacities, effusions or edema. No acute bony abnormality. IMPRESSION: Cardiomegaly.  No active disease. Electronically Signed   By: Charlett Nose M.D.   On: 09/07/2022 00:15   CT Angio Chest PE W and/or Wo Contrast  Result Date: 08/24/2022 CLINICAL DATA:  Acute onset chest pain and tachycardia. Hypoxia. High probability for pulmonary embolism. EXAM: CT ANGIOGRAPHY CHEST WITH CONTRAST TECHNIQUE: Multidetector CT imaging of the chest was performed using the standard protocol during bolus administration of intravenous contrast. Multiplanar CT image reconstructions and MIPs were obtained to evaluate the vascular anatomy. RADIATION DOSE REDUCTION: This exam was performed according to the departmental dose-optimization program which includes automated exposure control, adjustment of the mA and/or kV according  to patient size and/or use of iterative reconstruction technique. CONTRAST:  60mL OMNIPAQUE IOHEXOL 350 MG/ML SOLN COMPARISON:  04/07/2020 FINDINGS: Cardiovascular: Opacification of pulmonary arteries is suboptimal, however no pulmonary emboli are identified. No evidence of thoracic aortic dissection. 4.1 cm thoracic aortic aneurysm remains stable. Prior CABG again noted. Stable moderate cardiomegaly and left ventricular hypertrophy. Mediastinum/Nodes: No masses or pathologically enlarged lymph nodes identified. Lungs/Pleura: No pulmonary mass, infiltrate, or effusion. Upper abdomen:  Small calculus noted in lower pole of left kidney. No evidence of hydronephrosis. Musculoskeletal: No suspicious bone lesions identified. Review of the MIP images confirms the above findings. IMPRESSION: Suboptimal opacification of pulmonary arteries, however no pulmonary embolism identified. No other acute findings. Stable 4.1 cm thoracic aortic aneurysm. Recommend annual imaging followup by CTA or MRA. This recommendation follows 2010 ACCF/AHA/AATS/ACR/ASA/SCA/SCAI/SIR/STS/SVM Guidelines for the Diagnosis and Management of Patients with Thoracic Aortic Disease. Circulation. 2010; 121: Z610-R604. Aortic aneurysm NOS (ICD10-I71.9) Nonobstructing left renal calculus. Aortic Atherosclerosis (ICD10-I70.0). Electronically Signed   By: Danae Orleans M.D.   On: 08/24/2022 10:22   DG Chest Port 1 View  Result Date: 08/24/2022 CLINICAL DATA:  Shortness of breath EXAM: PORTABLE CHEST 1 VIEW COMPARISON:  08/20/2022 FINDINGS: Stable cardiomegaly. Prior sternotomy. Aortic atherosclerosis. Pulmonary vascular congestion. Mild bilateral interstitial prominence. No lobar consolidation. No pleural effusion or pneumothorax. IMPRESSION: Findings suggestive of CHF with mild interstitial edema. Electronically Signed   By: Duanne Guess D.O.   On: 08/24/2022 09:15   ECHOCARDIOGRAM COMPLETE  Result Date: 08/21/2022    ECHOCARDIOGRAM REPORT   Patient Name:   PHILOPATEER STRINE Date of Exam: 08/21/2022 Medical Rec #:  [id number]    Height:       70.0 in Accession #:    [id number]   Weight:       264.0 lb Date of Birth:  04-21-1961    BSA:          2.349 m Patient Age:    61 years     BP:           124/72 mmHg Patient Gender: M            HR:           64 bpm. Exam Location:  [facility] Procedure: 2D Echo, Cardiac Doppler, Color Doppler and Intracardiac            Opacification Agent Indications:     Chest Pain  History:         Patient has prior history of Echocardiogram examinations, most                  recent 03/07/2022. CHF, CAD and Previous  Myocardial Infarction,                  Arrythmias:Atrial Fibrillation, Signs/Symptoms:Chest Pain and                  Dyspnea; Risk Factors:Hypertension, Diabetes, Dyslipidemia and                  Sleep Apnea.  Sonographer:     Mikki Harbor Referring Phys:  [id number] CADENCE H FURTH Diagnosing Phys: Lorine Bears MD  Sonographer Comments: Technically difficult study due to poor echo windows, no subcostal window and patient is obese. Image acquisition challenging due to patient body habitus. IMPRESSIONS  1. Left ventricular ejection fraction, by estimation, is 55 to 60%. The left ventricle has normal function. The left ventricle has no regional wall motion abnormalities. There is severe left ventricular  hypertrophy. Left ventricular diastolic parameters  are consistent with Grade II diastolic dysfunction (pseudonormalization).  2. Right ventricular systolic function is mildly reduced. The right ventricular size is normal. There is normal pulmonary artery systolic pressure.  3. Left atrial size was severely dilated.  4. Right atrial size was moderately dilated.  5. The mitral valve is normal in structure. Mild mitral valve regurgitation. No evidence of mitral stenosis.  6. The aortic valve has been repaired/replaced. Aortic valve regurgitation is not visualized. Moderate aortic valve stenosis. Aortic valve area, by VTI measures 1.24 cm. Aortic valve mean gradient measures 29.5 mmHg.  7. Aortic dilatation noted. There is mild dilatation of the ascending aorta, measuring 40 mm.  8. The inferior vena cava is normal in size with <50% respiratory variability, suggesting right atrial pressure of 8 mmHg. FINDINGS  Left Ventricle: Left ventricular ejection fraction, by estimation, is 55 to 60%. The left ventricle has normal function. The left ventricle has no regional wall motion abnormalities. Definity contrast agent was given IV to delineate the left ventricular  endocardial borders. The left ventricular internal  cavity size was normal in size. There is severe left ventricular hypertrophy. Left ventricular diastolic parameters are consistent with Grade II diastolic dysfunction (pseudonormalization). Right Ventricle: The right ventricular size is normal. No increase in right ventricular wall thickness. Right ventricular systolic function is mildly reduced. There is normal pulmonary artery systolic pressure. The tricuspid regurgitant velocity is 1.88 m/s, and with an assumed right atrial pressure of 8 mmHg, the estimated right ventricular systolic pressure is 22.1 mmHg. Left Atrium: Left atrial size was severely dilated. Right Atrium: Right atrial size was moderately dilated. Pericardium: There is no evidence of pericardial effusion. Mitral Valve: The mitral valve is normal in structure. There is mild thickening of the mitral valve leaflet(s). Mild mitral valve regurgitation. No evidence of mitral valve stenosis. MV peak gradient, 5.9 mmHg. The mean mitral valve gradient is 2.0 mmHg. Tricuspid Valve: The tricuspid valve is normal in structure. Tricuspid valve regurgitation is trivial. No evidence of tricuspid stenosis. Aortic Valve: The aortic valve has been repaired/replaced. Aortic valve regurgitation is not visualized. Moderate aortic stenosis is present. Aortic valve mean gradient measures 29.5 mmHg. Aortic valve peak gradient measures 48.7 mmHg. Aortic valve area,  by VTI measures 1.24 cm. There is a bioprosthetic valve present in the aortic position. Pulmonic Valve: The pulmonic valve was normal in structure. Pulmonic valve regurgitation is mild. No evidence of pulmonic stenosis. Aorta: Aortic dilatation noted. There is mild dilatation of the ascending aorta, measuring 40 mm. Venous: The inferior vena cava is normal in size with less than 50% respiratory variability, suggesting right atrial pressure of 8 mmHg. IAS/Shunts: No atrial level shunt detected by color flow Doppler.  LEFT VENTRICLE PLAX 2D LVIDd:         4.20  cm   Diastology LVIDs:         2.80 cm   LV e' medial:    4.78 cm/s LV PW:         2.00 cm   LV E/e' medial:  20.9 LV IVS:        2.00 cm   LV e' lateral:   7.15 cm/s LVOT diam:     2.00 cm   LV E/e' lateral: 14.0 LV SV:         100 LV SV Index:   43 LVOT Area:     3.14 cm  RIGHT VENTRICLE RV Basal diam:  5.30 cm LEFT ATRIUM  Index        RIGHT ATRIUM           Index LA diam:        6.10 cm  2.60 cm/m   RA Area:     25.90 cm LA Vol (A2C):   103.0 ml 43.85 ml/m  RA Volume:   88.40 ml  37.64 ml/m LA Vol (A4C):   119.0 ml 50.67 ml/m LA Biplane Vol: 117.0 ml 49.81 ml/m  AORTIC VALVE                     PULMONIC VALVE AV Area (Vmax):    1.14 cm      PV Vmax:       1.20 m/s AV Area (Vmean):   1.05 cm      PV Peak grad:  5.8 mmHg AV Area (VTI):     1.24 cm AV Vmax:           349.00 cm/s AV Vmean:          251.500 cm/s AV VTI:            0.805 m AV Peak Grad:      48.7 mmHg AV Mean Grad:      29.5 mmHg LVOT Vmax:         127.00 cm/s LVOT Vmean:        84.300 cm/s LVOT VTI:          0.318 m LVOT/AV VTI ratio: 0.40  AORTA Ao Root diam: 3.50 cm Ao Asc diam:  4.00 cm MITRAL VALVE                TRICUSPID VALVE MV Area (PHT): 2.73 cm     TR Peak grad:   14.1 mmHg MV Area VTI:   2.65 cm     TR Vmax:        188.00 cm/s MV Peak grad:  5.9 mmHg MV Mean grad:  2.0 mmHg     SHUNTS MV Vmax:       1.21 m/s     Systemic VTI:  0.32 m MV Vmean:      68.9 cm/s    Systemic Diam: 2.00 cm MV Decel Time: 278 msec MV E velocity: 100.00 cm/s MV A velocity: 98.50 cm/s MV E/A ratio:  1.02 Lorine Bears MD Electronically signed by Lorine Bears MD Signature Date/Time: 08/21/2022/3:55:45 PM    Final    DG Chest Port 1 View  Result Date: 08/20/2022 CLINICAL DATA:  Chest pain, hypotension, weakness. Atrial fibrillation. EXAM: PORTABLE CHEST 1 VIEW COMPARISON:  03/08/2022 and CT chest 04/07/2020. FINDINGS: Trachea is midline. Heart is enlarged, stable. Thoracic aorta is calcified. Lungs are clear. No pleural. IMPRESSION: No  acute findings. Electronically Signed   By: Leanna Battles M.D.   On: 08/20/2022 13:43    Microbiology: Results for orders placed or performed during the hospital encounter of 08/24/22  Resp panel by RT-PCR (RSV, Flu A&B, Covid) Anterior Nasal Swab     Status: None   Collection Time: 08/24/22  9:18 AM   Specimen: Anterior Nasal Swab  Result Value Ref Range Status   SARS Coronavirus 2 by RT PCR NEGATIVE NEGATIVE Final    Comment: (NOTE) SARS-CoV-2 target nucleic acids are NOT DETECTED.  The SARS-CoV-2 RNA is generally detectable in upper respiratory specimens during the acute phase of infection. The lowest concentration of SARS-CoV-2 viral copies this assay can detect is 138 copies/mL. A negative result does not preclude SARS-Cov-2  infection and should not be used as the sole basis for treatment or other patient management decisions. A negative result may occur with  improper specimen collection/handling, submission of specimen other than nasopharyngeal swab, presence of viral mutation(s) within the areas targeted by this assay, and inadequate number of viral copies(<138 copies/mL). A negative result must be combined with clinical observations, patient history, and epidemiological information. The expected result is Negative.  Fact Sheet for Patients:  BloggerCourse.com  Fact Sheet for Healthcare Providers:  SeriousBroker.it  This test is no t yet approved or cleared by the [place] FDA and  has been authorized for detection and/or diagnosis of SARS-CoV-2 by FDA under an Emergency Use Authorization (EUA). This EUA will remain  in effect (meaning this test can be used) for the duration of the COVID-19 declaration under Section 564(b)(1) of the Act, 21 U.S.C.section 360bbb-3(b)(1), unless the authorization is terminated  or revoked sooner.       Influenza A by PCR NEGATIVE NEGATIVE Final   Influenza B by PCR NEGATIVE  NEGATIVE Final    Comment: (NOTE) The Xpert Xpress SARS-CoV-2/FLU/RSV plus assay is intended as an aid in the diagnosis of influenza from Nasopharyngeal swab specimens and should not be used as a sole basis for treatment. Nasal washings and aspirates are unacceptable for Xpert Xpress SARS-CoV-2/FLU/RSV testing.  Fact Sheet for Patients: BloggerCourse.com  Fact Sheet for Healthcare Providers: SeriousBroker.it  This test is not yet approved or cleared by the [place] FDA and has been authorized for detection and/or diagnosis of SARS-CoV-2 by FDA under an Emergency Use Authorization (EUA). This EUA will remain in effect (meaning this test can be used) for the duration of the COVID-19 declaration under Section 564(b)(1) of the Act, 21 U.S.C. section 360bbb-3(b)(1), unless the authorization is terminated or revoked.     Resp Syncytial Virus by PCR NEGATIVE NEGATIVE Final    Comment: (NOTE) Fact Sheet for Patients: BloggerCourse.com  Fact Sheet for Healthcare Providers: SeriousBroker.it  This test is not yet approved or cleared by the [place] FDA and has been authorized for detection and/or diagnosis of SARS-CoV-2 by FDA under an Emergency Use Authorization (EUA). This EUA will remain in effect (meaning this test can be used) for the duration of the COVID-19 declaration under Section 564(b)(1) of the Act, 21 U.S.C. section 360bbb-3(b)(1), unless the authorization is terminated or revoked.  Performed at [facility], [address] [place], [place] [address]   Culture, blood (Routine X 2) w Reflex to ID Panel     Status: None   Collection Time: 08/24/22 11:22 AM   Specimen: BLOOD LEFT HAND  Result Value Ref Range Status   Specimen Description BLOOD LEFT HAND  Final   Special Requests   Final    BOTTLES DRAWN AEROBIC AND ANAEROBIC Blood Culture adequate  volume   Culture   Final    NO GROWTH 5 DAYS Performed at [facility], [address], [place], [place] [address]    Report Status 08/29/2022 FINAL  Final  Culture, blood (Routine X 2) w Reflex to ID Panel     Status: None   Collection Time: 08/24/22 11:22 AM   Specimen: BLOOD RIGHT HAND  Result Value Ref Range Status   Specimen Description BLOOD RIGHT HAND  Final   Special Requests   Final    BOTTLES DRAWN AEROBIC AND ANAEROBIC Blood Culture adequate volume   Culture   Final    NO GROWTH 5 DAYS Performed at [facility], [address] [place], [place] [address]    Report Status 08/29/2022 FINAL  Final    Labs: CBC: Recent Labs  Lab 09/06/22 2348  WBC 9.9  NEUTROABS 5.8  HGB 10.0*  HCT 32.8*  MCV 80.4  PLT 308   Basic Metabolic Panel: Recent Labs  Lab 09/06/22 2348 09/07/22 0824 09/08/22 0414  NA 138 140 138  K 3.2* 2.7* 3.8  CL 104 112* 106  CO2 20* 21* 24  GLUCOSE 184* 122* 166*  BUN 26* 23* 29*  CREATININE 1.47* 1.14 1.50*  CALCIUM 8.9 7.0* 8.5*  MG 1.9 1.6* 2.5*  PHOS  --   --  5.2*   Liver Function Tests: Recent Labs  Lab 09/06/22 2348  AST 23  ALT 20  ALKPHOS 96  BILITOT 0.5  PROT 7.9  ALBUMIN 3.7   CBG: Recent Labs  Lab 09/07/22 0711 09/07/22 1152 09/07/22 1558 09/07/22 2223 09/08/22 0755  GLUCAP 125* 118* 146* 188* 140*    Discharge time spent: greater than 30 minutes.  Signed: Marrion Coy, MD [facility] 09/08/2022

## 2022-09-08 NOTE — Discharge Instructions (Signed)
Follow-up with PCP in 1 week. Follow-up with cardiology in [facility] as previous scheduled.

## 2022-09-15 ENCOUNTER — Other Ambulatory Visit: Payer: Self-pay

## 2022-09-15 ENCOUNTER — Emergency Department: Payer: Self-pay

## 2022-09-15 ENCOUNTER — Encounter: Payer: Self-pay | Admitting: Internal Medicine

## 2022-09-15 ENCOUNTER — Observation Stay
Admission: EM | Admit: 2022-09-15 | Discharge: 2022-09-16 | Disposition: A | Discharge status: Home / Self Care | Payer: Self-pay | Attending: Obstetrics and Gynecology | Admitting: Obstetrics and Gynecology

## 2022-09-15 DIAGNOSIS — I959 Hypotension, unspecified: Secondary | ICD-10-CM

## 2022-09-15 DIAGNOSIS — E872 Acidosis, unspecified: Secondary | ICD-10-CM | POA: Insufficient documentation

## 2022-09-15 DIAGNOSIS — E785 Hyperlipidemia, unspecified: Secondary | ICD-10-CM | POA: Insufficient documentation

## 2022-09-15 DIAGNOSIS — E1169 Type 2 diabetes mellitus with other specified complication: Secondary | ICD-10-CM | POA: Insufficient documentation

## 2022-09-15 DIAGNOSIS — I480 Paroxysmal atrial fibrillation: Secondary | ICD-10-CM | POA: Diagnosis present

## 2022-09-15 DIAGNOSIS — I130 Hypertensive heart and chronic kidney disease with heart failure and stage 1 through stage 4 chronic kidney disease, or unspecified chronic kidney disease: Secondary | ICD-10-CM | POA: Insufficient documentation

## 2022-09-15 DIAGNOSIS — R0789 Other chest pain: Principal | ICD-10-CM | POA: Insufficient documentation

## 2022-09-15 DIAGNOSIS — K118 Other diseases of salivary glands: Secondary | ICD-10-CM | POA: Insufficient documentation

## 2022-09-15 DIAGNOSIS — Z1152 Encounter for screening for COVID-19: Secondary | ICD-10-CM | POA: Insufficient documentation

## 2022-09-15 DIAGNOSIS — E876 Hypokalemia: Secondary | ICD-10-CM | POA: Insufficient documentation

## 2022-09-15 DIAGNOSIS — E039 Hypothyroidism, unspecified: Secondary | ICD-10-CM | POA: Diagnosis present

## 2022-09-15 DIAGNOSIS — E1165 Type 2 diabetes mellitus with hyperglycemia: Secondary | ICD-10-CM | POA: Diagnosis present

## 2022-09-15 DIAGNOSIS — Z952 Presence of prosthetic heart valve: Secondary | ICD-10-CM | POA: Insufficient documentation

## 2022-09-15 DIAGNOSIS — I10 Essential (primary) hypertension: Secondary | ICD-10-CM | POA: Diagnosis present

## 2022-09-15 DIAGNOSIS — Z8616 Personal history of COVID-19: Secondary | ICD-10-CM | POA: Insufficient documentation

## 2022-09-15 DIAGNOSIS — R4182 Altered mental status, unspecified: Secondary | ICD-10-CM

## 2022-09-15 DIAGNOSIS — R079 Chest pain, unspecified: Principal | ICD-10-CM | POA: Diagnosis present

## 2022-09-15 DIAGNOSIS — R6511 Systemic inflammatory response syndrome (SIRS) of non-infectious origin with acute organ dysfunction: Secondary | ICD-10-CM | POA: Insufficient documentation

## 2022-09-15 DIAGNOSIS — R55 Syncope and collapse: Secondary | ICD-10-CM

## 2022-09-15 DIAGNOSIS — E1122 Type 2 diabetes mellitus with diabetic chronic kidney disease: Secondary | ICD-10-CM | POA: Insufficient documentation

## 2022-09-15 DIAGNOSIS — Z7984 Long term (current) use of oral hypoglycemic drugs: Secondary | ICD-10-CM | POA: Insufficient documentation

## 2022-09-15 DIAGNOSIS — Z79899 Other long term (current) drug therapy: Secondary | ICD-10-CM | POA: Insufficient documentation

## 2022-09-15 DIAGNOSIS — N1831 Chronic kidney disease, stage 3a: Secondary | ICD-10-CM | POA: Insufficient documentation

## 2022-09-15 DIAGNOSIS — Z953 Presence of xenogenic heart valve: Secondary | ICD-10-CM

## 2022-09-15 DIAGNOSIS — I5032 Chronic diastolic (congestive) heart failure: Secondary | ICD-10-CM | POA: Diagnosis present

## 2022-09-15 DIAGNOSIS — Z7901 Long term (current) use of anticoagulants: Secondary | ICD-10-CM | POA: Insufficient documentation

## 2022-09-15 DIAGNOSIS — I350 Nonrheumatic aortic (valve) stenosis: Secondary | ICD-10-CM

## 2022-09-15 DIAGNOSIS — G4733 Obstructive sleep apnea (adult) (pediatric): Secondary | ICD-10-CM | POA: Diagnosis present

## 2022-09-15 DIAGNOSIS — N179 Acute kidney failure, unspecified: Secondary | ICD-10-CM | POA: Insufficient documentation

## 2022-09-15 DIAGNOSIS — E1140 Type 2 diabetes mellitus with diabetic neuropathy, unspecified: Secondary | ICD-10-CM | POA: Insufficient documentation

## 2022-09-15 DIAGNOSIS — I2510 Atherosclerotic heart disease of native coronary artery without angina pectoris: Secondary | ICD-10-CM | POA: Diagnosis present

## 2022-09-15 DIAGNOSIS — Z951 Presence of aortocoronary bypass graft: Secondary | ICD-10-CM

## 2022-09-15 DIAGNOSIS — N183 Chronic kidney disease, stage 3 unspecified: Secondary | ICD-10-CM | POA: Diagnosis present

## 2022-09-15 DIAGNOSIS — G9341 Metabolic encephalopathy: Secondary | ICD-10-CM | POA: Insufficient documentation

## 2022-09-15 LAB — COMPREHENSIVE METABOLIC PANEL
ALT: 23 U/L (ref 0–44)
AST: 27 U/L (ref 15–41)
Albumin: 3.9 g/dL (ref 3.5–5.0)
Alkaline Phosphatase: 93 U/L (ref 38–126)
Anion gap: 12 (ref 5–15)
BUN: 29 mg/dL — ABNORMAL HIGH (ref 6–20)
CO2: 25 mmol/L (ref 22–32)
Calcium: 8.8 mg/dL — ABNORMAL LOW (ref 8.9–10.3)
Chloride: 99 mmol/L (ref 98–111)
Creatinine, Ser: 1.66 mg/dL — ABNORMAL HIGH (ref 0.61–1.24)
GFR, Estimated: 47 mL/min — ABNORMAL LOW (ref >60)
Glucose, Bld: 242 mg/dL — ABNORMAL HIGH (ref 70–99)
Potassium: 2.9 mmol/L — ABNORMAL LOW (ref 3.5–5.1)
Sodium: 136 mmol/L (ref 135–145)
Total Bilirubin: 0.6 mg/dL (ref 0.3–1.2)
Total Protein: 8 g/dL (ref 6.5–8.1)

## 2022-09-15 LAB — URINALYSIS, ROUTINE W REFLEX MICROSCOPIC
Bacteria, UA: NONE SEEN
Bilirubin Urine: NEGATIVE
Glucose, UA: >=500 mg/dL — AB
Hgb urine dipstick: NEGATIVE
Ketones, ur: NEGATIVE mg/dL
Leukocytes,Ua: NEGATIVE
Nitrite: NEGATIVE
Protein, ur: 100 mg/dL — AB
Specific Gravity, Urine: 1.027 (ref 1.005–1.030)
pH: 6 (ref 5.0–8.0)

## 2022-09-15 LAB — URINE DRUG SCREEN, QUALITATIVE (ARMC ONLY)
Amphetamines, Ur Screen: NOT DETECTED
Barbiturates, Ur Screen: NOT DETECTED
Benzodiazepine, Ur Scrn: NOT DETECTED
Cannabinoid 50 Ng, Ur ~~LOC~~: NOT DETECTED
Cocaine Metabolite,Ur ~~LOC~~: NOT DETECTED
MDMA (Ecstasy)Ur Screen: NOT DETECTED
Methadone Scn, Ur: NOT DETECTED
Opiate, Ur Screen: NOT DETECTED
Phencyclidine (PCP) Ur S: NOT DETECTED
Tricyclic, Ur Screen: NOT DETECTED

## 2022-09-15 LAB — CBG MONITORING, ED
Glucose-Capillary: 164 mg/dL — ABNORMAL HIGH (ref 70–99)
Glucose-Capillary: 149 mg/dL — ABNORMAL HIGH (ref 70–99)
Glucose-Capillary: 155 mg/dL — ABNORMAL HIGH (ref 70–99)

## 2022-09-15 LAB — CBC WITH DIFFERENTIAL/PLATELET
Abs Immature Granulocytes: 0.04 10*3/uL (ref 0.00–0.07)
Basophils Absolute: 0.1 10*3/uL (ref 0.0–0.1)
Basophils Relative: 1 %
Eosinophils Absolute: 0.8 10*3/uL — ABNORMAL HIGH (ref 0.0–0.5)
Eosinophils Relative: 7 %
HCT: 34 % — ABNORMAL LOW (ref 39.0–52.0)
Hemoglobin: 10.4 g/dL — ABNORMAL LOW (ref 13.0–17.0)
Immature Granulocytes: 0 %
Lymphocytes Relative: 20 %
Lymphs Abs: 2.3 10*3/uL (ref 0.7–4.0)
MCH: 24.3 pg — ABNORMAL LOW (ref 26.0–34.0)
MCHC: 30.6 g/dL (ref 30.0–36.0)
MCV: 79.4 fL — ABNORMAL LOW (ref 80.0–100.0)
Monocytes Absolute: 0.7 10*3/uL (ref 0.1–1.0)
Monocytes Relative: 6 %
Neutro Abs: 7.1 10*3/uL (ref 1.7–7.7)
Neutrophils Relative %: 66 %
Platelets: 350 10*3/uL (ref 150–400)
RBC: 4.28 MIL/uL (ref 4.22–5.81)
RDW: 14.9 % (ref 11.5–15.5)
WBC: 11 10*3/uL — ABNORMAL HIGH (ref 4.0–10.5)
nRBC: 0 % (ref 0.0–0.2)

## 2022-09-15 LAB — LACTIC ACID, PLASMA
Lactic Acid, Venous: 2.5 mmol/L (ref 0.5–1.9)
Lactic Acid, Venous: 1.5 mmol/L (ref 0.5–1.9)

## 2022-09-15 LAB — BRAIN NATRIURETIC PEPTIDE: B Natriuretic Peptide: 172.2 pg/mL — ABNORMAL HIGH (ref 0.0–100.0)

## 2022-09-15 LAB — GLUCOSE, CAPILLARY
Glucose-Capillary: 165 mg/dL — ABNORMAL HIGH (ref 70–99)
Glucose-Capillary: 171 mg/dL — ABNORMAL HIGH (ref 70–99)

## 2022-09-15 LAB — SARS CORONAVIRUS 2 BY RT PCR: SARS Coronavirus 2 by RT PCR: NEGATIVE

## 2022-09-15 LAB — TROPONIN I (HIGH SENSITIVITY)
Troponin I (High Sensitivity): 53 ng/L — ABNORMAL HIGH (ref <18)
Troponin I (High Sensitivity): 65 ng/L — ABNORMAL HIGH (ref <18)

## 2022-09-15 MED ORDER — INSULIN ASPART 100 UNIT/ML IJ SOLN
0.0000 [IU] | Freq: Three times a day (TID) | INTRAMUSCULAR | Status: DC
  Administered 2022-09-15: 2 [IU] via SUBCUTANEOUS
  Administered 2022-09-15: 1 [IU] via SUBCUTANEOUS
  Administered 2022-09-15 – 2022-09-16 (×3): 2 [IU] via SUBCUTANEOUS
  Filled 2022-09-15 (×5): qty 1

## 2022-09-15 MED ORDER — POTASSIUM CHLORIDE 10 MEQ/100ML IV SOLN
10.0000 meq | Freq: Once | INTRAVENOUS | Status: AC
  Administered 2022-09-15: 10 meq via INTRAVENOUS
  Filled 2022-09-15: qty 100

## 2022-09-15 MED ORDER — RIVAROXABAN 20 MG PO TABS
20.0000 mg | ORAL_TABLET | Freq: Every day | ORAL | Status: DC
  Administered 2022-09-15: 20 mg via ORAL
  Filled 2022-09-15: qty 1

## 2022-09-15 MED ORDER — INSULIN ASPART 100 UNIT/ML IJ SOLN: 0.0000 [IU] | Freq: Every day | INTRAMUSCULAR | Status: DC

## 2022-09-15 MED ORDER — IOHEXOL 350 MG/ML SOLN
75.0000 mL | Freq: Once | INTRAVENOUS | Status: AC | PRN
  Administered 2022-09-15: 75 mL via INTRAVENOUS

## 2022-09-15 MED ORDER — LEVOTHYROXINE SODIUM 100 MCG PO TABS
300.0000 ug | ORAL_TABLET | Freq: Every day | ORAL | Status: DC
  Administered 2022-09-15 – 2022-09-16 (×2): 300 ug via ORAL
  Filled 2022-09-15: qty 3
  Filled 2022-09-15: qty 6

## 2022-09-15 MED ORDER — SODIUM CHLORIDE 0.9 % IV BOLUS (SEPSIS)
1000.0000 mL | Freq: Once | INTRAVENOUS | Status: AC
  Administered 2022-09-15: 1000 mL via INTRAVENOUS

## 2022-09-15 MED ORDER — VANCOMYCIN HCL IN DEXTROSE 1-5 GM/200ML-% IV SOLN
1000.0000 mg | Freq: Once | INTRAVENOUS | Status: DC
  Filled 2022-09-15: qty 200

## 2022-09-15 MED ORDER — ACETAMINOPHEN 325 MG PO TABS
650.0000 mg | ORAL_TABLET | Freq: Four times a day (QID) | ORAL | Status: DC | PRN
  Administered 2022-09-15 – 2022-09-16 (×4): 650 mg via ORAL
  Filled 2022-09-15 (×4): qty 2

## 2022-09-15 MED ORDER — GABAPENTIN 300 MG PO CAPS
300.0000 mg | ORAL_CAPSULE | Freq: Two times a day (BID) | ORAL | Status: DC
  Administered 2022-09-15 – 2022-09-16 (×3): 300 mg via ORAL
  Filled 2022-09-15 (×3): qty 1

## 2022-09-15 MED ORDER — PROCHLORPERAZINE EDISYLATE 10 MG/2ML IJ SOLN: 5.0000 mg | Freq: Four times a day (QID) | INTRAMUSCULAR | Status: DC | PRN

## 2022-09-15 MED ORDER — ENOXAPARIN SODIUM 60 MG/0.6ML IJ SOSY
0.5000 mg/kg | PREFILLED_SYRINGE | INTRAMUSCULAR | Status: DC
  Administered 2022-09-15: 60 mg via SUBCUTANEOUS
  Filled 2022-09-15: qty 0.6

## 2022-09-15 MED ORDER — POTASSIUM CHLORIDE CRYS ER 20 MEQ PO TBCR
40.0000 meq | EXTENDED_RELEASE_TABLET | Freq: Two times a day (BID) | ORAL | Status: AC
  Administered 2022-09-15 (×2): 40 meq via ORAL
  Filled 2022-09-15 (×2): qty 2

## 2022-09-15 MED ORDER — SODIUM CHLORIDE 0.9 % IV BOLUS (SEPSIS): 1000.0000 mL | Freq: Once | INTRAVENOUS | Status: DC

## 2022-09-15 MED ORDER — SODIUM CHLORIDE 0.9 % IV SOLN
2.0000 g | Freq: Once | INTRAVENOUS | Status: AC
  Administered 2022-09-15: 2 g via INTRAVENOUS
  Filled 2022-09-15: qty 12.5

## 2022-09-15 MED ORDER — METRONIDAZOLE 500 MG/100ML IV SOLN
500.0000 mg | Freq: Once | INTRAVENOUS | Status: AC
  Administered 2022-09-15: 500 mg via INTRAVENOUS
  Filled 2022-09-15: qty 100

## 2022-09-15 MED ORDER — POLYETHYLENE GLYCOL 3350 17 G PO PACK: 17.0000 g | PACK | Freq: Every day | ORAL | Status: DC | PRN

## 2022-09-15 MED ORDER — VANCOMYCIN HCL IN DEXTROSE 1-5 GM/200ML-% IV SOLN: 1000.0000 mg | Freq: Once | INTRAVENOUS | Status: DC

## 2022-09-15 MED ORDER — SODIUM CHLORIDE 0.9 % IV BOLUS (SEPSIS): 400.0000 mL | Freq: Once | INTRAVENOUS | Status: DC

## 2022-09-15 MED ORDER — VANCOMYCIN HCL 1500 MG/300ML IV SOLN
1500.0000 mg | Freq: Once | INTRAVENOUS | Status: DC
  Filled 2022-09-15: qty 300

## 2022-09-15 MED ORDER — AMIODARONE HCL 200 MG PO TABS
200.0000 mg | ORAL_TABLET | Freq: Every day | ORAL | Status: DC
  Administered 2022-09-15 – 2022-09-16 (×2): 200 mg via ORAL
  Filled 2022-09-15 (×2): qty 1

## 2022-09-15 MED ORDER — ATORVASTATIN CALCIUM 80 MG PO TABS
80.0000 mg | ORAL_TABLET | Freq: Every evening | ORAL | Status: DC
  Administered 2022-09-15: 80 mg via ORAL
  Filled 2022-09-15: qty 4

## 2022-09-15 MED ORDER — PANTOPRAZOLE SODIUM 20 MG PO TBEC
20.0000 mg | DELAYED_RELEASE_TABLET | Freq: Two times a day (BID) | ORAL | Status: DC
  Administered 2022-09-15 – 2022-09-16 (×3): 20 mg via ORAL
  Filled 2022-09-15 (×4): qty 1

## 2022-09-15 MED ORDER — ISOSORBIDE MONONITRATE ER 30 MG PO TB24
30.0000 mg | ORAL_TABLET | Freq: Every day | ORAL | Status: DC
  Administered 2022-09-16: 30 mg via ORAL
  Filled 2022-09-15 (×2): qty 1

## 2022-09-15 MED ORDER — SODIUM CHLORIDE 0.9 % IV BOLUS
700.0000 mL | Freq: Once | INTRAVENOUS | Status: AC
  Administered 2022-09-15: 700 mL via INTRAVENOUS

## 2022-09-15 MED ORDER — SODIUM CHLORIDE 0.9 % IV BOLUS
500.0000 mL | Freq: Once | INTRAVENOUS | Status: AC
  Administered 2022-09-15: 500 mL via INTRAVENOUS

## 2022-09-15 MED ORDER — POTASSIUM CHLORIDE 2 MEQ/ML IV SOLN
INTRAVENOUS | Status: DC
  Filled 2022-09-15: qty 1000

## 2022-09-15 NOTE — Progress Notes (Signed)
Anticoagulation monitoring(Lovenox):  62 yo male ordered Lovenox 40 mg Q24h    There were no vitals filed for this visit. BMI    Lab Results  Component Value Date   CREATININE 1.66 (H) 09/15/2022   CREATININE 1.50 (H) 09/08/2022   CREATININE 1.14 09/07/2022   Estimated Creatinine Clearance: 62.8 mL/min (A) (by C-G formula based on SCr of 1.66 mg/dL (H)). Hemoglobin & Hematocrit     Component Value Date/Time   HGB 10.4 (L) 09/15/2022 0336   HGB 11.4 (L) 11/17/2018 0838   HCT 34.0 (L) 09/15/2022 0336   HCT 36.8 (L) 09/22/2013 1657     Per Protocol for Patient with estCrcl > 30 ml/min and BMI > 30, will transition to Lovenox 60 mg Q24h.

## 2022-09-15 NOTE — Progress Notes (Signed)
PHARMACY -  BRIEF ANTIBIOTIC NOTE   Pharmacy has received consult(s) for Cefepime, Vancomycin from an ED provider.  The patient's profile has been reviewed for ht/wt/allergies/indication/available labs.    One time order(s) placed for Cefepime 2 gm IV X 1 and Vancomycin 2500 mg IV X 1   Further antibiotics/pharmacy consults should be ordered by admitting physician if indicated.                       Thank you, Devanny Palecek D 09/15/2022  5:22 AM

## 2022-09-15 NOTE — ED Provider Notes (Signed)
[facility] Provider Note    Event Date/Time   First MD Initiated Contact with Patient 09/15/22 [phone]     (approximate)   History   Chest Pain and Hypotension   HPI  History obtained via tele-Spanish interpreter Level V caveat: Limited by decreased mentation  Craig Reese is a 62 y.o. male brought to the ED via EMS from home with a chief complaint of chest pain.  History is limited due to patient having taken some nighttime medications making him sleepy as well as keeping his eyes closed not wanting to respond.  Patient states he was awakened approximately 1 hour ago with central chest pain.  States he passed out.  Recent hospitalization for same.  Appears diaphoretic and ashen.  Denies associated shortness of breath, nausea/vomiting or dizziness.     Past Medical History   Past Medical History:  Diagnosis Date   AF (paroxysmal atrial fibrillation) (HCC) 06/06/2018   CAD (coronary artery disease) 05/13/2018   Chronic diastolic CHF (congestive heart failure) (HCC) 11/17/2018   Coronary artery disease    Diabetes mellitus type II, non insulin dependent (HCC) 11/17/2018   Hypothyroidism 11/17/2018     Active Problem List   Patient Active Problem List   Diagnosis Date Noted   Dyslipidemia 09/07/2022   Paroxysmal atrial fibrillation (HCC) 09/07/2022   Persistent atrial fibrillation (HCC) 09/07/2022   Acute on chronic diastolic congestive heart failure (HCC) 08/24/2022   Acute on chronic diastolic CHF (congestive heart failure) (HCC) 08/24/2022   CKD stage 3a, GFR 45-59 ml/min (HCC) 08/20/2022   History of aortic valve stenosis 08/20/2022   Dyspnea 08/20/2022   Obesity (BMI 30-39.9) 08/20/2022   Severe obstructive sleep apnea 08/20/2022   Hypotension 08/20/2022   AKI (acute kidney injury) (HCC) 03/07/2022   NSTEMI (non-ST elevated myocardial infarction) (HCC) 03/06/2022   AF (paroxysmal atrial fibrillation) (HCC)    Acquired  thrombophilia (HCC)    HLD (hyperlipidemia) 04/07/2020   Elevated troponin 04/07/2020   Abnormal LFTs 04/07/2020   Leukocytosis 04/07/2020   HTN (hypertension) 04/07/2020   Chest pain 11/17/2018   Type 2 diabetes mellitus with hyperlipidemia (HCC) 11/17/2018   Hypothyroidism 11/17/2018   Hypokalemia 11/17/2018   Acute respiratory failure with hypoxia (HCC) 11/17/2018   COVID-19 virus infection 11/17/2018   Hypertensive urgency 11/17/2018   Chronic diastolic CHF (congestive heart failure) (HCC) 11/17/2018   Prolonged QT interval 11/17/2018   Atrial fibrillation with RVR (HCC) 06/06/2018   CAD (coronary artery disease) 05/13/2018     Past Surgical History   Past Surgical History:  Procedure Laterality Date   CORONARY ARTERY BYPASS GRAFT     LEFT HEART CATH AND CORS/GRAFTS ANGIOGRAPHY N/A 05/17/2018   Procedure: LEFT HEART CATH AND CORS/GRAFTS ANGIOGRAPHY and possible PCI and stent;  Surgeon: Alwyn Pea, MD;  Location: [facility] INVASIVE CV LAB;  Service: Cardiovascular;  Laterality: N/A;     Home Medications   Prior to Admission medications   Medication Sig Start Date End Date Taking? Authorizing Provider  amiodarone (PACERONE) 200 MG tablet Take 1 tablet (200 mg total) by mouth daily. 09/03/22   Sunnie Nielsen, DO  atorvastatin (LIPITOR) 80 MG tablet Take 80 mg by mouth every evening.    [provider]  diclofenac Sodium (VOLTAREN) 1 % GEL Apply 2 g topically 4 (four) times daily as needed. 09/08/22   Marrion Coy, MD  furosemide (LASIX) 40 MG tablet Take 1 tablet (40 mg total) by mouth daily as needed. for up  to 3 days for increased leg swelling, shortness of breath, weight gain 5+ lbs over 1-2 days. Seek medical care if these symptoms are not improving with increased dose. 08/26/22   Sunnie Nielsen, DO  gabapentin (NEURONTIN) 300 MG capsule Take 300 mg by mouth 2 (two) times daily.    [provider]  isosorbide mononitrate (IMDUR) 30 MG 24 hr tablet  Take 1 tablet (30 mg total) by mouth daily. 09/08/22   Marrion Coy, MD  levothyroxine (SYNTHROID) 300 MCG tablet Take 300 mcg by mouth daily before breakfast. 05/10/22 05/10/23  [provider]  lidocaine (XYLOCAINE) 5 % ointment Apply 1 Application topically 2 (two) times daily as needed. To chaest 09/08/22   Marrion Coy, MD  metFORMIN (GLUCOPHAGE) 1000 MG tablet Take 1,000 mg by mouth 2 (two) times daily with a meal.    [provider]  pantoprazole (PROTONIX) 20 MG tablet Take 1 tablet (20 mg total) by mouth 2 (two) times daily. 03/09/22 04/08/22  Tresa Moore, MD  rivaroxaban (XARELTO) 20 MG TABS tablet Take 1 tablet (20 mg total) by mouth daily. Patient taking differently: Take 20 mg by mouth daily with supper. 04/08/20   Alford Highland, MD     Allergies  Patient has no known allergies.   Family History   Family History  Problem Relation Age of Onset   Heart disease Brother      Physical Exam  Triage Vital Signs: ED Triage Vitals [09/15/22 0330]  Enc Vitals Group     BP 100/64     Pulse      Resp 18     Temp      Temp src      SpO2      Weight      Height      Head Circumference      Peak Flow      Pain Score      Pain Loc      Pain Edu?      Excl. in GC?     Updated Vital Signs: BP 91/61   Pulse 73   Temp 98.2 F (36.8 C)   Resp 15   SpO2 99%    General: Awake, mild distress.  Diaphoretic. CV:  RRR.  Good peripheral perfusion.  Resp:  Normal effort.  CTAB.  Central chest tender to palpation. Abd:  Nontender.  No distention.  No truncal vesicles. Other:  Bilateral calves are supple and nontender.   ED Results / Procedures / Treatments  Labs (all labs ordered are listed, but only abnormal results are displayed) Labs Reviewed  CBC WITH DIFFERENTIAL/PLATELET - Abnormal; Notable for the following components:      Result Value   WBC 11.0 (*)    Hemoglobin 10.4 (*)    HCT 34.0 (*)    MCV 79.4 (*)    MCH 24.3 (*)     Eosinophils Absolute 0.8 (*)    All other components within normal limits  COMPREHENSIVE METABOLIC PANEL - Abnormal; Notable for the following components:   Potassium 2.9 (*)    Glucose, Bld 242 (*)    BUN 29 (*)    Creatinine, Ser 1.66 (*)    Calcium 8.8 (*)    GFR, Estimated 47 (*)    All other components within normal limits  BRAIN NATRIURETIC PEPTIDE - Abnormal; Notable for the following components:   B Natriuretic Peptide 172.2 (*)    All other components within normal limits  URINALYSIS, ROUTINE W REFLEX  MICROSCOPIC - Abnormal; Notable for the following components:   Color, Urine YELLOW (*)    APPearance HAZY (*)    Glucose, UA >=500 (*)    Protein, ur 100 (*)    All other components within normal limits  LACTIC ACID, PLASMA - Abnormal; Notable for the following components:   Lactic Acid, Venous 2.5 (*)    All other components within normal limits  TROPONIN I (HIGH SENSITIVITY) - Abnormal; Notable for the following components:   Troponin I (High Sensitivity) 53 (*)    All other components within normal limits  SARS CORONAVIRUS 2 BY RT PCR  CULTURE, BLOOD (ROUTINE X 2)  CULTURE, BLOOD (ROUTINE X 2)  URINE DRUG SCREEN, QUALITATIVE ([facility] ONLY)  LACTIC ACID, PLASMA  TROPONIN I (HIGH SENSITIVITY)     EKG  ED ECG REPORT I, Domonick Sittner J, the attending physician, personally viewed and interpreted this ECG.   Date: 09/15/2022  EKG Time: 0333  Rate: 78  Rhythm: normal sinus rhythm  Axis: LAD  Intervals:left anterior fascicular block  ST&T Change: Nonspecific    RADIOLOGY I have independently visualized patient's x-ray and CT scans as well as noted the radiology interpretation:  Chest x-ray: No acute cardiopulmonary process  CT head: No ICH or acute abnormality.  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  Right parotid mass.  CTA chest: Stable known thoracic aortic aneurysm without rupture.  Cardiomegaly with  LVH.  Official radiology report(s): CT Angio Chest PE W/Cm &/Or Wo Cm  Result Date: 09/15/2022 CLINICAL DATA:  Known thoracic aneurysm EXAM: CT ANGIOGRAPHY CHEST WITH CONTRAST TECHNIQUE: Multidetector CT imaging of the chest was performed using the standard protocol during bolus administration of intravenous contrast. Multiplanar CT image reconstructions and MIPs were obtained to evaluate the vascular anatomy. RADIATION DOSE REDUCTION: This exam was performed according to the departmental dose-optimization program which includes automated exposure control, adjustment of the mA and/or kV according to patient size and/or use of iterative reconstruction technique. CONTRAST:  75mL OMNIPAQUE IOHEXOL 350 MG/ML SOLN COMPARISON:  08/24/2022 FINDINGS: Cardiovascular: Satisfactory opacification of the pulmonary arteries to the segmental level. No evidence of pulmonary embolism. Enlarged heart with left ventricular thickening. There is atheromatous calcification that is extensive with prior CABG. A saphenous in LIMA grafts are enhancing proximally. Heavy calcification of the aortic valve and root of the aorta. Mild dilatation of the ascending aorta 4 cm. Mediastinum/Nodes: Negative for adenopathy or mass Lungs/Pleura: Low volume chest with mild ground-glass density that is likely atelectasis. Upper Abdomen: No acute finding Musculoskeletal: No acute finding Review of the MIP images confirms the above findings. IMPRESSION: 1. No acute finding. No evidence of acute aortic syndrome. Mild dilatation of the ascending aorta measuring up to 4 cm in diameter. There is heavy calcification of the aortic valve and aortic root. 2. Cardiomegaly with left ventricular hypertrophy. Atherosclerosis with prior CABG. Electronically Signed   By: Tiburcio Pea M.D.   On: 09/15/2022 04:45   CT Head Wo Contrast  Result Date: 09/15/2022 CLINICAL DATA:  Mental status change with unknown cause EXAM: CT HEAD WITHOUT CONTRAST TECHNIQUE:  Contiguous axial images were obtained from the base of the skull through the vertex without intravenous contrast. RADIATION DOSE REDUCTION: This exam was performed according to the departmental dose-optimization program which includes automated exposure control, adjustment of the mA and/or kV according to patient size and/or use of iterative reconstruction technique. COMPARISON:  06/06/2012 FINDINGS: Brain: Chronic although interval right MCA branch infarct  centered at the parietal to posterior temporal lobes and involving cortex. No evidence of acute infarct, hemorrhage, hydrocephalus, or collection. Vascular: No hyperdense vessel or unexpected calcification. Skull: Normal. Negative for fracture or focal lesion. Sinuses/Orbits: No acute finding. 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 IMPRESSION: 1. No acute finding. 2. 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 3. 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 Electronically Signed   By: Tiburcio Pea M.D.   On: 09/15/2022 04:15   DG Chest Port 1 View  Result Date: 09/15/2022 CLINICAL DATA:  Altered mental status EXAM: PORTABLE CHEST 1 VIEW COMPARISON:  09/07/2022 FINDINGS: Cardiac shadow is enlarged but stable. Postsurgical changes are seen. Aortic calcifications are noted. Inspiratory effort is poor although no focal infiltrate is seen. No acute bony abnormality is noted. IMPRESSION: No acute abnormality noted. Electronically Signed   By: Alcide Clever M.D.   On: 09/15/2022 03:54     PROCEDURES:  Critical Care performed: Yes, see critical care procedure note(s)  CRITICAL CARE Performed by: Irean Hong   Total critical care time: 45 minutes  Critical care time was exclusive of separately billable procedures and treating other patients.  Critical care was necessary to treat or prevent imminent or life-threatening  deterioration.  Critical care was time spent personally by me on the following activities: development of treatment plan with patient and/or surrogate as well as nursing, discussions with consultants, evaluation of patient's response to treatment, examination of patient, obtaining history from patient or surrogate, ordering and performing treatments and interventions, ordering and review of laboratory studies, ordering and review of radiographic studies, pulse oximetry and re-evaluation of patient's condition.   Marland Kitchen1-3 Lead EKG Interpretation  Performed by: Irean Hong, MD Authorized by: Irean Hong, MD     Interpretation: normal     ECG rate:  80   ECG rate assessment: normal     Rhythm: sinus rhythm     Ectopy: none     Conduction: normal   Comments:     Patient placed on cardiac monitor to evaluate for arrhythmias    MEDICATIONS ORDERED IN ED: Medications  potassium chloride 10 mEq in 100 mL IVPB (10 mEq Intravenous New Bag/Given 09/15/22 0524)  potassium chloride 10 mEq in 100 mL IVPB (has no administration in time range)  ceFEPIme (MAXIPIME) 2 g in sodium chloride 0.9 % 100 mL IVPB (2 g Intravenous New Bag/Given 09/15/22 0546)  metroNIDAZOLE (FLAGYL) IVPB 500 mg (has no administration in time range)  vancomycin (VANCOCIN) IVPB 1000 mg/200 mL premix (has no administration in time range)    Followed by  vancomycin (VANCOREADY) IVPB 1500 mg/300 mL (has no administration in time range)  enoxaparin (LOVENOX) injection 60 mg (has no administration in time range)  insulin aspart (novoLOG) injection 0-9 Units (has no administration in time range)  insulin aspart (novoLOG) injection 0-5 Units (has no administration in time range)  potassium chloride SA (KLOR-CON M) CR tablet 40 mEq (has no administration in time range)  sodium chloride 0.9 % bolus 500 mL (0 mLs Intravenous Stopped 09/15/22 0524)  iohexol (OMNIPAQUE) 350 MG/ML injection 75 mL (75 mLs Intravenous Contrast Given 09/15/22 0402)   sodium chloride 0.9 % bolus 1,000 mL (1,000 mLs Intravenous New Bag/Given 09/15/22 0530)     IMPRESSION / MDM / ASSESSMENT AND PLAN / ED COURSE  I reviewed the triage vital signs and the nursing notes.  62 year old male presenting with altered mentation and chest pain. Differential diagnosis includes, but is not limited to, ACS, aortic dissection, pulmonary embolism, cardiac tamponade, pneumothorax, pneumonia, pericarditis, myocarditis, GI-related causes including esophagitis/gastritis, and musculoskeletal chest wall pain.   Personally reviewed patient's records and note his hospitalization from 4/20 - 09/08/2022 for chest pain.  CTA chest demonstrated no PE, stable thoracic aneurysm without rupture.  Echocardiogram demonstrated severely LVH with normal ejection fraction.  Recent heart cath on 3/14 demonstrated moderate disease in proximal SVG graft, LAD.  That was the third time he was admitted to [facility] in 3 weeks; patient also had hospitalization to [facility].  Patient's presentation is most consistent with acute presentation with potential threat to life or bodily function.  The patient is on the cardiac monitor to evaluate for evidence of arrhythmia and/or significant heart rate changes.  Will obtain cardiac panel, CT Head and Chest. Continue IV fluid resuscitation; EMS reported initial SBP in the 80s.  Administer judicious IV fluids given patient's history of CHF.  Anticipate hospitalization.  I did personally reviewed patient's medication list and do not see any medications which may cause somnolence nor is patient taking a beta-blocker.  Clinical Course as of 09/15/22 8119  Mon Sep 15, 2022  1478 Patient back from CT scan.  Has his eyes open, seems more alert. [JS]  0454 CTA chest stable from prior.  Laboratory results demonstrates stable hemoglobin 10.4, mild hypokalemia with potassium 2.9, mild AKI creatinine 1.66 which is elevated from prior.  Troponin in  line with prior values.  Minimally elevated BNP.  UA and COVID-negative.  Blood pressure currently 90 systolic.  Will discuss case with CCU intensivist Ouma for disposition. [JS]  0516 Initial lactic acid 2.5.  Will initiate ED code sepsis protocol and initiate broad-spectrum IV antibiotics. [JS]  0520 CCU intensivist Ouma evaluated patient at bedside.  Given that his MAP remains above 60, she feels he would be appropriate for hospitalist service on stepdown unit.  Will consult hospitalist services for evaluation and admission. [JS]    Clinical Course User Index [JS] Irean Hong, MD     FINAL CLINICAL IMPRESSION(S) / ED DIAGNOSES   Final diagnoses:  Chest pain, unspecified type  Syncope, unspecified syncope type  Parotid mass  Hypokalemia  Hypotension, unspecified hypotension type  Altered mental status, unspecified altered mental status type     Rx / DC Orders   ED Discharge Orders     None        Note:  This document was prepared using Dragon voice recognition software and may include unintentional dictation errors.   Irean Hong, MD 09/15/22 [phone]

## 2022-09-15 NOTE — Code Documentation (Signed)
CODE SEPSIS - PHARMACY COMMUNICATION  **Broad Spectrum Antibiotics should be administered within 1 hour of Sepsis diagnosis**  Time Code Sepsis Called/Page Received: 0519  Antibiotics Ordered: cefepime, metronidazole, vancomycin  Time of 1st antibiotic administration: 0546  Additional action taken by pharmacy: N/A    Manfred Shirts ,PharmD Clinical Pharmacist  09/15/2022  8:16 AM

## 2022-09-15 NOTE — H&P (Addendum)
History and Physical  Craig Reese ZOX:[id number] DOB: 1960-11-19 DOA: 09/15/2022  Referring physician:Dr Dolores Frame, EDP PCP: Katrina Stack, MD  Outpatient Specialists: Cardiology Patient coming from: Home  Chief Complaint: Chest pain.   HPI: Craig Reese is a 62 y.o. male with medical history significant for coronary artery disease with single bypass grafting, paroxysmal A-fib on Xarelto and amiodarone, chronic diastolic CHF, type 2 diabetes, hypothyroidism, who presented to [facility] ED from home with complaints of sudden onset centrally located chest pressure.  Symptoms started about an hour prior to presentation.  History is mainly obtained from EDP and from review of medical records.  At the time of this visit, the patient is somnolent but arousable to voices.  Arouses and falls back to sleep.  Upon presentation to the ED, hypersomnolent and hypotensive with systolic blood pressures in the 80s improved with 1.5 L NS IV fluid boluses.  CT head nonacute, chronic infarct seen on CT head.  Stable ascending aorta aneurysm.  ED Course: Tmax 98.2.  BP 91/61, pulse 73, respiratory 15, O2 saturation 99% on room air.  Lab studies remarkable for serum potassium 2.9, glucose 242, BUN 29, creatinine 1.66, GFR 47.  BNP 172.  Hemoglobin 10.4, WBC 11.0.  MCV 79.4.  Review of Systems: Review of systems as noted in the HPI. All other systems reviewed and are negative.   Past Medical History:  Diagnosis Date   AF (paroxysmal atrial fibrillation) (HCC) 06/06/2018   CAD (coronary artery disease) 05/13/2018   Chronic diastolic CHF (congestive heart failure) (HCC) 11/17/2018   Coronary artery disease    Diabetes mellitus type II, non insulin dependent (HCC) 11/17/2018   Hypothyroidism 11/17/2018   Past Surgical History:  Procedure Laterality Date   CORONARY ARTERY BYPASS GRAFT     LEFT HEART CATH AND CORS/GRAFTS ANGIOGRAPHY N/A 05/17/2018   Procedure: LEFT HEART CATH AND CORS/GRAFTS  ANGIOGRAPHY and possible PCI and stent;  Surgeon: Alwyn Pea, MD;  Location: [facility] INVASIVE CV LAB;  Service: Cardiovascular;  Laterality: N/A;    Social History:  reports that he has never smoked. He has never used smokeless tobacco. He reports that he does not currently use alcohol. He reports that he does not use drugs.   No Known Allergies  Family History  Problem Relation Age of Onset   Heart disease Brother       Prior to Admission medications   Medication Sig Start Date End Date Taking? Authorizing Provider  amiodarone (PACERONE) 200 MG tablet Take 1 tablet (200 mg total) by mouth daily. 09/03/22   Sunnie Nielsen, DO  atorvastatin (LIPITOR) 80 MG tablet Take 80 mg by mouth every evening.    [provider]  diclofenac Sodium (VOLTAREN) 1 % GEL Apply 2 g topically 4 (four) times daily as needed. 09/08/22   Marrion Coy, MD  furosemide (LASIX) 40 MG tablet Take 1 tablet (40 mg total) by mouth daily as needed. for up to 3 days for increased leg swelling, shortness of breath, weight gain 5+ lbs over 1-2 days. Seek medical care if these symptoms are not improving with increased dose. 08/26/22   Sunnie Nielsen, DO  gabapentin (NEURONTIN) 300 MG capsule Take 300 mg by mouth 2 (two) times daily.    [provider]  isosorbide mononitrate (IMDUR) 30 MG 24 hr tablet Take 1 tablet (30 mg total) by mouth daily. 09/08/22   Marrion Coy, MD  levothyroxine (SYNTHROID) 300 MCG tablet Take 300 mcg by mouth daily before breakfast. 05/10/22  05/10/23  [provider]  lidocaine (XYLOCAINE) 5 % ointment Apply 1 Application topically 2 (two) times daily as needed. To chaest 09/08/22   Marrion Coy, MD  metFORMIN (GLUCOPHAGE) 1000 MG tablet Take 1,000 mg by mouth 2 (two) times daily with a meal.    [provider]  pantoprazole (PROTONIX) 20 MG tablet Take 1 tablet (20 mg total) by mouth 2 (two) times daily. 03/09/22 04/08/22  Tresa Moore, MD   rivaroxaban (XARELTO) 20 MG TABS tablet Take 1 tablet (20 mg total) by mouth daily. Patient taking differently: Take 20 mg by mouth daily with supper. 04/08/20   Alford Highland, MD    Physical Exam: BP 91/61   Pulse 73   Temp 98.2 F (36.8 C)   Resp 15   SpO2 99%   General: 62 y.o. year-old male well developed well nourished in no acute distress.  Somnolent but arousable to voices. Cardiovascular: Regular rate and rhythm with no rubs or gallops.  No thyromegaly or JVD noted.  No lower extremity edema. 2/4 pulses in all 4 extremities. Respiratory: Clear to auscultation with no wheezes or rales.  Poor inspiratory effort. Abdomen: Soft nontender nondistended with normal bowel sounds x4 quadrants. Muskuloskeletal: No cyanosis, clubbing or edema noted bilaterally Neuro: CN II-XII intact, strength, sensation, reflexes Skin: No ulcerative lesions noted or rashes Psychiatry: Unable to assess mood and judgment due to hypersomnolence.          Labs on Admission:  Basic Metabolic Panel: Recent Labs  Lab 09/15/22 0336  NA 136  K 2.9*  CL 99  CO2 25  GLUCOSE 242*  BUN 29*  CREATININE 1.66*  CALCIUM 8.8*   Liver Function Tests: Recent Labs  Lab 09/15/22 0336  AST 27  ALT 23  ALKPHOS 93  BILITOT 0.6  PROT 8.0  ALBUMIN 3.9   No results for input(s): "LIPASE", "AMYLASE" in the last 168 hours. No results for input(s): "AMMONIA" in the last 168 hours. CBC: Recent Labs  Lab 09/15/22 0336  WBC 11.0*  NEUTROABS 7.1  HGB 10.4*  HCT 34.0*  MCV 79.4*  PLT 350   Cardiac Enzymes: No results for input(s): "CKTOTAL", "CKMB", "CKMBINDEX", "TROPONINI" in the last 168 hours.  BNP (last 3 results) Recent Labs    08/24/22 0826 09/06/22 2348 09/15/22 0336  BNP 236.2* 205.0* 172.2*    ProBNP (last 3 results) No results for input(s): "PROBNP" in the last 8760 hours.  CBG: Recent Labs  Lab 09/08/22 0755  GLUCAP 140*    Radiological Exams on Admission: CT Angio Chest  PE W/Cm &/Or Wo Cm  Result Date: 09/15/2022 CLINICAL DATA:  Known thoracic aneurysm EXAM: CT ANGIOGRAPHY CHEST WITH CONTRAST TECHNIQUE: Multidetector CT imaging of the chest was performed using the standard protocol during bolus administration of intravenous contrast. Multiplanar CT image reconstructions and MIPs were obtained to evaluate the vascular anatomy. RADIATION DOSE REDUCTION: This exam was performed according to the departmental dose-optimization program which includes automated exposure control, adjustment of the mA and/or kV according to patient size and/or use of iterative reconstruction technique. CONTRAST:  75mL OMNIPAQUE IOHEXOL 350 MG/ML SOLN COMPARISON:  08/24/2022 FINDINGS: Cardiovascular: Satisfactory opacification of the pulmonary arteries to the segmental level. No evidence of pulmonary embolism. Enlarged heart with left ventricular thickening. There is atheromatous calcification that is extensive with prior CABG. A saphenous in LIMA grafts are enhancing proximally. Heavy calcification of the aortic valve and root of the aorta. Mild dilatation of the ascending aorta 4 cm. Mediastinum/Nodes:  Negative for adenopathy or mass Lungs/Pleura: Low volume chest with mild ground-glass density that is likely atelectasis. Upper Abdomen: No acute finding Musculoskeletal: No acute finding Review of the MIP images confirms the above findings. IMPRESSION: 1. No acute finding. No evidence of acute aortic syndrome. Mild dilatation of the ascending aorta measuring up to 4 cm in diameter. There is heavy calcification of the aortic valve and aortic root. 2. Cardiomegaly with left ventricular hypertrophy. Atherosclerosis with prior CABG. Electronically Signed   By: Tiburcio Pea M.D.   On: 09/15/2022 04:45   CT Head Wo Contrast  Result Date: 09/15/2022 CLINICAL DATA:  Mental status change with unknown cause EXAM: CT HEAD WITHOUT CONTRAST TECHNIQUE: Contiguous axial images were obtained from the base of the  skull through the vertex without intravenous contrast. RADIATION DOSE REDUCTION: This exam was performed according to the departmental dose-optimization program which includes automated exposure control, adjustment of the mA and/or kV according to patient size and/or use of iterative reconstruction technique. 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 No evidence of acute infarct, hemorrhage, hydrocephalus, or collection. Vascular: No hyperdense vessel or unexpected calcification. Skull: Normal. Negative for fracture or focal lesion. Sinuses/Orbits: No acute finding. 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 IMPRESSION: 1. No acute finding. 2. 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 3. 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 Electronically Signed   By: Tiburcio Pea M.D.   On: 09/15/2022 04:15   DG Chest Port 1 View  Result Date: 09/15/2022 CLINICAL DATA:  Altered mental status EXAM: PORTABLE CHEST 1 VIEW COMPARISON:  09/07/2022 FINDINGS: Cardiac shadow is enlarged but stable. Postsurgical changes are seen. Aortic calcifications are noted. Inspiratory effort is poor although no focal infiltrate is seen. No acute bony abnormality is noted. IMPRESSION: No acute abnormality noted. Electronically Signed   By: Alcide Clever M.D.   On: 09/15/2022 03:54    EKG: I independently viewed the EKG done and my findings are as followed: Sinus rhythm rate of 78.  RBBB, LAFB.  QTc 551.  Assessment/Plan Present on Admission:  Chest pain  Principal Problem:   Chest pain  Chest pain, rule out ACS First set of high-sensitivity troponin 53 No evidence of acute ischemia on twelve-lead EKG Trend troponin Monitor on telemetry Resume home cardiac medications.  Acute metabolic  encephalopathy, unclear etiology CT head with incidental finding moderate right parietotemporal infarct which is new from 2014 but chronic appearing. UDS negative Reorient as needed  AKI, suspect prerenal in the setting of dehydration from poor oral intake Normal renal function at baseline Presented with creatinine 1.6 with GFR 47 Avoid nephrotoxic agents and hypotension. Closely monitor urine output Repeat BMP in the morning.  QTc prolongation Optimize magnesium and potassium levels Goal magnesium greater than 2.0, goal potassium greater than 4.0 Monitor on telemetry  SIRS, lactic acidosis Peripheral blood cultures x 2 ordered by EDP Started on broad-spectrum IV antibiotics by EDP until active infective process can be ruled out Follow cultures results  HFpEF 55 to 60% Euvolemic on exam Last 2D echo done on 08/21/2022 revealed LVEF 55 to 60% with grade 2 diastolic dysfunction.  Severe left ventricular hypertrophy.  Left atrial size was severely dilated.  Right atrial size was moderately dilated. Strict I's and O's and daily weight  Type 2 diabetes with  hyperglycemia Presented with serum glucose of 242 Last hemoglobin A1c 9.3 on 08/24/2022. Heart healthy, carb modified diet Start insulin sliding scale.  Peripheral neuropathy Resume home gabapentin  Hyperlipidemia Resume home Lipitor  GERD Resume home PPI  Paroxysmal A-fib Resume home Xarelto Currently rate controlled Monitor on telemetry  Hypothyroidism Resume home levothyroxine.  Hypokalemia Serum potassium 2.9 Repleted orally and intravenously. 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     DVT prophylaxis: Home Xarelto  Code Status: Full code  Family Communication: None at  bedside.  Disposition Plan: Admitted to progressive care unit  Consults called: None.  Admission status: Observation status.   Status is: Observation    Darlin Drop MD [facility] Pager [phone]  If 7PM-7AM, please contact night-coverage [email or web address] Password [facility]  09/15/2022, 5:32 AM

## 2022-09-15 NOTE — Sepsis Progress Note (Signed)
Secure chat to Provider and bedside nurse regarding need to consider additional fluids based off of sepsis protocol with at least 2 low Bps. He has received 1500 ml so far and needs a total of 2190 ml based off of IBW.

## 2022-09-15 NOTE — Progress Notes (Signed)
Secure chatted bedside RN inquiring if blood cultures were drawn prior to Abx. BSRN replied they were at 0600, however 1st Abx charted for 0546.

## 2022-09-15 NOTE — Evaluation (Signed)
Physical Therapy Evaluation Patient Details Name: Craig Reese MRN: [id number] DOB: 07-03-1960 Today's Date: 09/15/2022  History of Present Illness  Pt is a 62 y.o. male with medical history significant for coronary artery disease with single bypass grafting, paroxysmal A-fib on Xarelto and amiodarone, chronic diastolic CHF, type 2 diabetes, hypothyroidism, who presented to [facility] ED from home with complaints of sudden onset centrally located chest pressure. MD assessment includes: chest pain, acute metabolic encephalopathy, AKI, SIRS, and hypokalemia.   Clinical Impression  Pt was pleasant and motivated to participate during the session and put forth good effort throughout. In house interpreter Kandis Cocking utilized during the session.  Pt required no physical assistance during the session but was limited by chest pain.  Pt self-selected use of a RW to address chest pain and was limited to in-room ambulation distances secondary to pain.  Pt generally steady with all standing activities with no adverse symptoms noted other than chest pain, SpO2 and HR WNL on room air.  Pt will benefit from continued PT services upon discharge to safely address deficits listed in patient problem list for decreased caregiver assistance and eventual return to PLOF.         Recommendations for follow up therapy are one component of a multi-disciplinary discharge planning process, led by the attending physician.  Recommendations may be updated based on patient status, additional functional criteria and insurance authorization.  Follow Up Recommendations       Assistance Recommended at Discharge Intermittent Supervision/Assistance  Patient can return home with the following  A little help with walking and/or transfers;Assistance with cooking/housework;Help with stairs or ramp for entrance;Assist for transportation    Equipment Recommendations Rolling walker (2 wheels)  Recommendations for Other Services        Functional Status Assessment Patient has had a recent decline in their functional status and demonstrates the ability to make significant improvements in function in a reasonable and predictable amount of time.     Precautions / Restrictions Precautions Precautions: Fall Restrictions Weight Bearing Restrictions: No      Mobility  Bed Mobility Overal bed mobility: Modified Independent             General bed mobility comments: Min extra time and effort only    Transfers Overall transfer level: Modified independent Equipment used: Rolling walker (2 wheels)               General transfer comment: Good control and stability    Ambulation/Gait Ambulation/Gait assistance: Supervision Gait Distance (Feet): 15 Feet Assistive device: Rolling walker (2 wheels) Gait Pattern/deviations: Step-through pattern, Decreased step length - right, Decreased step length - left, Trunk flexed Gait velocity: decreased     General Gait Details: Slow cadence but steady without LOB with pt self-selecting use of RW secondary to chest pain  Stairs            Wheelchair Mobility    Modified Rankin (Stroke Patients Only)       Balance Overall balance assessment: No apparent balance deficits (not formally assessed)                                           Pertinent Vitals/Pain Pain Assessment Pain Assessment: 0-10 Pain Score: 6  Pain Location: chest Pain Descriptors / Indicators: Sore Pain Intervention(s): Repositioned, Premedicated before session, Monitored during session    Home Living Family/patient expects to be  discharged to:: Private residence Living Arrangements: Non-relatives/Friends Available Help at Discharge: Friend(s);Available PRN/intermittently Type of Home: House Home Access: Level entry     Alternate Level Stairs-Number of Steps: 7 Home Layout: Two level;Bed/bath upstairs Home Equipment: None      Prior Function Prior Level  of Function : Independent/Modified Independent             Mobility Comments: Ind amb community distances without an AD, no fall history ADLs Comments: Ind with ADLs     Hand Dominance        Extremity/Trunk Assessment   Upper Extremity Assessment Upper Extremity Assessment: Overall WFL for tasks assessed    Lower Extremity Assessment Lower Extremity Assessment: Overall WFL for tasks assessed       Communication   Communication: Interpreter utilized  Cognition Arousal/Alertness: Awake/alert Behavior During Therapy: WFL for tasks assessed/performed Overall Cognitive Status: Within Functional Limits for tasks assessed                                          General Comments      Exercises     Assessment/Plan    PT Assessment Patient needs continued PT services  PT Problem List Decreased activity tolerance;Decreased mobility;Pain;Decreased knowledge of use of DME       PT Treatment Interventions DME instruction;Gait training;Stair training;Functional mobility training;Therapeutic activities;Therapeutic exercise;Patient/family education;Balance training    PT Goals (Current goals can be found in the Care Plan section)  Acute Rehab PT Goals Patient Stated Goal: Decreased chest pain PT Goal Formulation: With patient Time For Goal Achievement: 09/28/22 Potential to Achieve Goals: Fair    Frequency Min 3X/week     Co-evaluation               AM-PAC PT "6 Clicks" Mobility  Outcome Measure Help needed turning from your back to your side while in a flat bed without using bedrails?: None Help needed moving from lying on your back to sitting on the side of a flat bed without using bedrails?: None Help needed moving to and from a bed to a chair (including a wheelchair)?: None Help needed standing up from a chair using your arms (e.g., wheelchair or bedside chair)?: None Help needed to walk in hospital room?: A Little Help needed climbing  3-5 steps with a railing? : A Little 6 Click Score: 22    End of Session Equipment Utilized During Treatment: Gait belt Activity Tolerance: Patient tolerated treatment well Patient left: in bed;with call bell/phone within reach;Other (comment) (both bed rails up in ED bed as pt found) Nurse Communication: Mobility status PT Visit Diagnosis: Difficulty in walking, not elsewhere classified (R26.2);Pain Pain - part of body:  (chest)    Time: 1610-9604 PT Time Calculation (min) (ACUTE ONLY): 26 min   Charges:   PT Evaluation $PT Eval Moderate Complexity: 1 Mod        D. Scott Fate Caster PT, DPT 09/15/22, 10:24 AM

## 2022-09-15 NOTE — Progress Notes (Signed)
Interim progress note not for billing  Admitted earlier this morning by my colleague. I have reviewed chart, seen and examined the patient. Agree with a/p except as otherwise stated.  Hx cad s/p cabg x1 and PCI with des, hx endocarditis s/p bioprosthetic aortic valve replacement, a-fib, hfpef, ckd 3a, htn, hypothyroidism post-surgical, osa, and right parotid mass followed by [facility] with tentative plans for surgery, also many years ongoing chest pain, followed by [facility]ology, had LHC 07/2022 with stable CAD no culprit lesion, presenting with his similar chest pain, also had elevated lactic acid (2.5) and k of 2.9. troponins mild elevation stable from all priors, bnp mild elevation stable from priors, satting 100% on room air, reported to be encephalopathic overnight but this morning that has cleared, CTA of chest nothing acute, CT head showing right parietotemporal infarct which is old and was seen on [facility] MRI earlier this year. Reports chest pain has resolved. I will review the case with cardiology but unfortunately given the longstanding nature of his chest pain and thorough recent w/u am doubtful we will have much to offer. Will work on resolving electrolyte abnormalities. Anticipate discharge tomorrow.

## 2022-09-15 NOTE — Progress Notes (Signed)
Pt being followed by [facility] for Sepsis protocol. 

## 2022-09-16 LAB — BASIC METABOLIC PANEL
Anion gap: 9 (ref 5–15)
BUN: 24 mg/dL — ABNORMAL HIGH (ref 6–20)
CO2: 24 mmol/L (ref 22–32)
Calcium: 8.6 mg/dL — ABNORMAL LOW (ref 8.9–10.3)
Chloride: 105 mmol/L (ref 98–111)
Creatinine, Ser: 1.3 mg/dL — ABNORMAL HIGH (ref 0.61–1.24)
GFR, Estimated: >60 mL/min (ref >60)
Glucose, Bld: 163 mg/dL — ABNORMAL HIGH (ref 70–99)
Potassium: 3.7 mmol/L (ref 3.5–5.1)
Sodium: 138 mmol/L (ref 135–145)

## 2022-09-16 LAB — GLUCOSE, CAPILLARY
Glucose-Capillary: 153 mg/dL — ABNORMAL HIGH (ref 70–99)
Glucose-Capillary: 190 mg/dL — ABNORMAL HIGH (ref 70–99)

## 2022-09-16 MED ORDER — EMPAGLIFLOZIN 10 MG PO TABS: 10.0000 mg | ORAL_TABLET | Freq: Every day | ORAL | 1 refills | Status: DC

## 2022-09-16 NOTE — Plan of Care (Signed)
Patient is participating in goals of care to meet goals for discharge.  Terrilyn Saver, RN   Problem: Education: Goal: Understanding of cardiac disease, CV risk reduction, and recovery process will improve Outcome: Progressing Goal: Individualized Educational Video(s) Outcome: Progressing   Problem: Activity: Goal: Ability to tolerate increased activity will improve Outcome: Progressing   Problem: Cardiac: Goal: Ability to achieve and maintain adequate cardiovascular perfusion will improve Outcome: Progressing   Problem: Health Behavior/Discharge Planning: Goal: Ability to safely manage health-related needs after discharge will improve Outcome: Progressing   Problem: Education: Goal: Ability to describe self-care measures that may prevent or decrease complications (Diabetes Survival Skills Education) will improve Outcome: Progressing Goal: Individualized Educational Video(s) Outcome: Progressing   Problem: Coping: Goal: Ability to adjust to condition or change in health will improve Outcome: Progressing   Problem: Fluid Volume: Goal: Ability to maintain a balanced intake and output will improve Outcome: Progressing   Problem: Health Behavior/Discharge Planning: Goal: Ability to identify and utilize available resources and services will improve Outcome: Progressing Goal: Ability to manage health-related needs will improve Outcome: Progressing   Problem: Metabolic: Goal: Ability to maintain appropriate glucose levels will improve Outcome: Progressing   Problem: Nutritional: Goal: Maintenance of adequate nutrition will improve Outcome: Progressing Goal: Progress toward achieving an optimal weight will improve Outcome: Progressing   Problem: Skin Integrity: Goal: Risk for impaired skin integrity will decrease Outcome: Progressing   Problem: Tissue Perfusion: Goal: Adequacy of tissue perfusion will improve Outcome: Progressing   Problem: Education: Goal:  Knowledge of General Education information will improve Description: Including pain rating scale, medication(s)/side effects and non-pharmacologic comfort measures Outcome: Progressing   Problem: Health Behavior/Discharge Planning: Goal: Ability to manage health-related needs will improve Outcome: Progressing   Problem: Clinical Measurements: Goal: Ability to maintain clinical measurements within normal limits will improve Outcome: Progressing Goal: Will remain free from infection Outcome: Progressing Goal: Diagnostic test results will improve Outcome: Progressing Goal: Respiratory complications will improve Outcome: Progressing Goal: Cardiovascular complication will be avoided Outcome: Progressing   Problem: Activity: Goal: Risk for activity intolerance will decrease Outcome: Progressing   Problem: Nutrition: Goal: Adequate nutrition will be maintained Outcome: Progressing   Problem: Coping: Goal: Level of anxiety will decrease Outcome: Progressing   Problem: Elimination: Goal: Will not experience complications related to bowel motility Outcome: Progressing Goal: Will not experience complications related to urinary retention Outcome: Progressing   Problem: Pain Managment: Goal: General experience of comfort will improve Outcome: Progressing   Problem: Safety: Goal: Ability to remain free from injury will improve Outcome: Progressing   Problem: Skin Integrity: Goal: Risk for impaired skin integrity will decrease Outcome: Progressing

## 2022-09-16 NOTE — Discharge Summary (Addendum)
Craig Reese WGN:[id number] DOB: August 03, 1960 DOA: 09/15/2022  PCP: Katrina Stack, MD  Admit date: 09/15/2022 Discharge date: 09/16/2022  Time spent: 35 minutes  Recommendations for Outpatient Follow-up:  [facility] cardiology f/u Pcp f/u as well, check potassium then   Discharge Diagnoses:  Principal Problem:   Chest pain Active Problems:   CAD (coronary artery disease)   Type 2 diabetes mellitus with hyperlipidemia (HCC)   HTN (hypertension)   CKD stage 3a, GFR 45-59 ml/min (HCC)   Hypothyroidism   Severe obstructive sleep apnea   Chronic diastolic CHF (congestive heart failure) (HCC)   AF (paroxysmal atrial fibrillation) (HCC)   History of aortic valve replacement with bioprosthetic valve   Hx of CABG   Parotid mass   Aortic valve stenosis   Discharge Condition: stable  Diet recommendation: heart healthy  There were no vitals filed for this visit.  History of present illness:  From admission h and p Craig Reese is a 62 y.o. male with medical history significant for coronary artery disease with single bypass grafting, paroxysmal A-fib on Xarelto and amiodarone, chronic diastolic CHF, type 2 diabetes, hypothyroidism, who presented to [facility] ED from home with complaints of sudden onset centrally located chest pressure.  Symptoms started about an hour prior to presentation.  History is mainly obtained from EDP and from review of medical records.  At the time of this visit, the patient is somnolent but arousable to voices.  Arouses and falls back to sleep.   Upon presentation to the ED, hypersomnolent and hypotensive with systolic blood pressures in the 80s improved with 1.5 L NS IV fluid boluses.  CT head nonacute, chronic infarct seen on CT head.  Stable ascending aorta aneurysm.  Hospital Course:  Hx cad s/p cabg x1 and PCI with des, hx endocarditis s/p bioprosthetic aortic valve replacement, a-fib, hfpef, ckd 3a, htn, hypothyroidism post-surgical,  osa, and right parotid mass followed [facility] with tentative plans for surgery, also many years ongoing chest pain, followed by [facility], had LHC 07/2022 with stable CAD no culprit lesion, presenting with his similar chest pain, also had elevated lactic acid (2.5) and k of 2.9. 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 Reports chest pain has resolved. I will review the case with cardiology who advise no further inpatient w/u, advise outpatient f/u with his [facility] cardiologist. Hyperkalemia resolved. Given elevated lactate on presentation and hypokalemia discussed use only as needed of home lasix and will stop metformin. Given hypokalemia will hold home chlorthalidone.  Procedures: none   Consultations: No formal cardiology consult but case was discussed with Dr. Kirke Corin  Discharge Exam: Vitals:   09/16/22 0746 09/16/22 1129  BP: (!) 152/89 135/88  Pulse: 65 73  Resp: 16 16  Temp: (!) 97.4 F (36.3 C) 98.2 F (36.8 C)  SpO2: 96% 95%    General: NAD Cardiovascular: RRR, soft systolic murmur Respiratory: CTAB Ext: warm, no edema  Discharge Instructions   Discharge Instructions     Diet - low sodium heart healthy   Complete by: As directed    Increase activity slowly   Complete by: As directed       Allergies as of 09/16/2022   No Known Allergies  Medication List     STOP taking these medications    chlorthalidone 25 MG tablet Commonly known as: HYGROTON   metFORMIN 1000 MG tablet Commonly known as: GLUCOPHAGE       TAKE these medications    amiodarone 200 MG tablet Commonly known as: Pacerone Take 1 tablet (200 mg total) by mouth daily.   atorvastatin 80 MG tablet Commonly known as:  LIPITOR Take 80 mg by mouth every evening.   diclofenac Sodium 1 % Gel Commonly known as: VOLTAREN Apply 2 g topically 4 (four) times daily as needed.   furosemide 40 MG tablet Commonly known as: LASIX Take 1 tablet (40 mg total) by mouth daily as needed. for up to 3 days for increased leg swelling, shortness of breath, weight gain 5+ lbs over 1-2 days. Seek medical care if these symptoms are not improving with increased dose.   gabapentin 300 MG capsule Commonly known as: NEURONTIN Take 300 mg by mouth 2 (two) times daily.   Invokana 300 MG Tabs tablet Generic drug: canagliflozin Take 300 mg by mouth daily before breakfast.   isosorbide mononitrate 30 MG 24 hr tablet Commonly known as: IMDUR Take 1 tablet (30 mg total) by mouth daily.   levothyroxine 300 MCG tablet Commonly known as: SYNTHROID Take 300 mcg by mouth daily before breakfast.   lidocaine 5 % ointment Commonly known as: XYLOCAINE Apply 1 Application topically 2 (two) times daily as needed. To chaest   pantoprazole 20 MG tablet Commonly known as: PROTONIX Take 1 tablet (20 mg total) by mouth 2 (two) times daily.   rivaroxaban 20 MG Tabs tablet Commonly known as: XARELTO Take 1 tablet (20 mg total) by mouth daily.   sucralfate 1 GM/10ML suspension Commonly known as: CARAFATE Take 1 g by mouth 4 (four) times daily -  with meals and at bedtime.               Durable Medical Equipment  (From admission, onward)           Start     Ordered   09/16/22 1139  DME Walker  Once       Question Answer Comment  Walker: With 5 Inch Wheels   Patient needs a walker to treat with the following condition Coronary artery disease      09/16/22 1138           No Known Allergies  Follow-up Information     Your [facility] Cardiologist Follow up.                   The results of significant diagnostics from this hospitalization (including imaging, microbiology, ancillary and laboratory) are listed  below for reference.    Significant Diagnostic Studies: CT Angio Chest PE W/Cm &/Or Wo Cm  Result Date: 09/15/2022 CLINICAL DATA:  Known thoracic aneurysm EXAM: CT ANGIOGRAPHY CHEST WITH CONTRAST TECHNIQUE: Multidetector CT imaging of the chest was performed using the standard protocol during bolus administration of intravenous contrast. Multiplanar CT image reconstructions and MIPs were obtained to evaluate the vascular anatomy. RADIATION DOSE REDUCTION: This exam was performed according to the departmental dose-optimization program which includes automated exposure control, adjustment of the mA and/or kV according to patient size and/or use of iterative reconstruction technique. CONTRAST:  75mL OMNIPAQUE IOHEXOL 350 MG/ML SOLN COMPARISON:  08/24/2022 FINDINGS: Cardiovascular: Satisfactory opacification of the pulmonary arteries to the segmental level. No evidence of pulmonary embolism. Enlarged heart with left ventricular thickening. There is atheromatous calcification that is  extensive with prior CABG. A saphenous in LIMA grafts are enhancing proximally. Heavy calcification of the aortic valve and root of the aorta. Mild dilatation of the ascending aorta 4 cm. Mediastinum/Nodes: Negative for adenopathy or mass Lungs/Pleura: Low volume chest with mild ground-glass density that is likely atelectasis. Upper Abdomen: No acute finding Musculoskeletal: No acute finding Review of the MIP images confirms the above findings. IMPRESSION: 1. No acute finding. No evidence of acute aortic syndrome. Mild dilatation of the ascending aorta measuring up to 4 cm in diameter. There is heavy calcification of the aortic valve and aortic root. 2. Cardiomegaly with left ventricular hypertrophy. Atherosclerosis with prior CABG. Electronically Signed   By: Tiburcio Pea M.D.   On: 09/15/2022 04:45   CT Head Wo Contrast  Result Date: 09/15/2022 CLINICAL DATA:  Mental status change with unknown cause EXAM: CT HEAD WITHOUT  CONTRAST TECHNIQUE: Contiguous axial images were obtained from the base of the skull through the vertex without intravenous contrast. RADIATION DOSE REDUCTION: This exam was performed according to the departmental dose-optimization program which includes automated exposure control, adjustment of the mA and/or kV according to patient size and/or use of iterative reconstruction technique. 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 No evidence of acute infarct, hemorrhage, hydrocephalus, or collection. Vascular: No hyperdense vessel or unexpected calcification. Skull: Normal. Negative for fracture or focal lesion. Sinuses/Orbits: No acute finding. 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 IMPRESSION: 1. No acute finding. 2. 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 3. 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 Electronically Signed   By: Tiburcio Pea M.D.   On: 09/15/2022 04:15   DG Chest Port 1 View  Result Date: 09/15/2022 CLINICAL DATA:  Altered mental status EXAM: PORTABLE CHEST 1 VIEW COMPARISON:  09/07/2022 FINDINGS: Cardiac shadow is enlarged but stable. Postsurgical changes are seen. Aortic calcifications are noted. Inspiratory effort is poor although no focal infiltrate is seen. No acute bony abnormality is noted. IMPRESSION: No acute abnormality noted. Electronically Signed   By: Alcide Clever M.D.   On: 09/15/2022 03:54   DG Chest Portable 1 View  Result Date: 09/07/2022 CLINICAL DATA:  Chest pain EXAM: PORTABLE CHEST 1 VIEW COMPARISON:  08/24/2022 FINDINGS: Prior CABG. Cardiomegaly. No confluent opacities, effusions or edema. No acute bony abnormality. IMPRESSION: Cardiomegaly.  No active disease. Electronically Signed   By: Charlett Nose M.D.   On: 09/07/2022 00:15    CT Angio Chest PE W and/or Wo Contrast  Result Date: 08/24/2022 CLINICAL DATA:  Acute onset chest pain and tachycardia. Hypoxia. High probability for pulmonary embolism. EXAM: CT ANGIOGRAPHY CHEST WITH CONTRAST TECHNIQUE: Multidetector CT imaging of the chest was performed using the standard protocol during bolus administration of intravenous contrast. Multiplanar CT image reconstructions and MIPs were obtained to evaluate the vascular anatomy. RADIATION DOSE REDUCTION: This exam was performed according to the departmental dose-optimization program which includes automated exposure control, adjustment of the mA and/or kV according to patient size and/or use of iterative reconstruction technique. CONTRAST:  60mL OMNIPAQUE IOHEXOL 350 MG/ML SOLN COMPARISON:  04/07/2020 FINDINGS: Cardiovascular: Opacification of pulmonary arteries is suboptimal, however no pulmonary emboli are identified. No evidence of thoracic aortic dissection. 4.1 cm thoracic aortic aneurysm remains stable. Prior CABG again noted. Stable moderate cardiomegaly and left ventricular hypertrophy. Mediastinum/Nodes: No masses or  pathologically enlarged lymph nodes identified. Lungs/Pleura: No pulmonary mass, infiltrate, or effusion. Upper abdomen: Small calculus noted in lower pole of left kidney. No evidence of hydronephrosis. Musculoskeletal: No suspicious bone lesions identified. Review of the MIP images confirms the above findings. IMPRESSION: Suboptimal opacification of pulmonary arteries, however no pulmonary embolism identified. No other acute findings. Stable 4.1 cm thoracic aortic aneurysm. Recommend annual imaging followup by CTA or MRA. This recommendation follows 2010 ACCF/AHA/AATS/ACR/ASA/SCA/SCAI/SIR/STS/SVM Guidelines for the Diagnosis and Management of Patients with Thoracic Aortic Disease. Circulation. 2010; 121: Z610-R604. Aortic aneurysm NOS (ICD10-I71.9) Nonobstructing left renal calculus. Aortic Atherosclerosis (ICD10-I70.0).  Electronically Signed   By: Danae Orleans M.D.   On: 08/24/2022 10:22   DG Chest Port 1 View  Result Date: 08/24/2022 CLINICAL DATA:  Shortness of breath EXAM: PORTABLE CHEST 1 VIEW COMPARISON:  08/20/2022 FINDINGS: Stable cardiomegaly. Prior sternotomy. Aortic atherosclerosis. Pulmonary vascular congestion. Mild bilateral interstitial prominence. No lobar consolidation. No pleural effusion or pneumothorax. IMPRESSION: Findings suggestive of CHF with mild interstitial edema. Electronically Signed   By: Duanne Guess D.O.   On: 08/24/2022 09:15   ECHOCARDIOGRAM COMPLETE  Result Date: 08/21/2022    ECHOCARDIOGRAM REPORT   Patient Name:   SANTE BIEDERMANN Date of Exam: 08/21/2022 Medical Rec #:  [id number]    Height:       70.0 in Accession #:    [id number]   Weight:       264.0 lb Date of Birth:  1960/10/21    BSA:          2.349 m Patient Age:    61 years     BP:           124/72 mmHg Patient Gender: M            HR:           64 bpm. Exam Location:  [facility] Procedure: 2D Echo, Cardiac Doppler, Color Doppler and Intracardiac            Opacification Agent Indications:     Chest Pain  History:         Patient has prior history of Echocardiogram examinations, most                  recent 03/07/2022. CHF, CAD and Previous Myocardial Infarction,                  Arrythmias:Atrial Fibrillation, Signs/Symptoms:Chest Pain and                  Dyspnea; Risk Factors:Hypertension, Diabetes, Dyslipidemia and                  Sleep Apnea.  Sonographer:     Mikki Harbor Referring Phys:  [id number] CADENCE H FURTH Diagnosing Phys: Lorine Bears MD  Sonographer Comments: Technically difficult study due to poor echo windows, no subcostal window and patient is obese. Image acquisition challenging due to patient body habitus. IMPRESSIONS  1. Left ventricular ejection fraction, by estimation, is 55 to 60%. The left ventricle has normal function. The left ventricle has no regional wall motion abnormalities. There is severe left  ventricular hypertrophy. Left ventricular diastolic parameters  are consistent with Grade II diastolic dysfunction (pseudonormalization).  2. Right ventricular systolic function is mildly reduced. The right ventricular size is normal. There is normal pulmonary artery systolic pressure.  3. Left atrial size was severely dilated.  4. Right atrial size was moderately dilated.  5. The mitral valve is normal in structure. Mild  mitral valve regurgitation. No evidence of mitral stenosis.  6. The aortic valve has been repaired/replaced. Aortic valve regurgitation is not visualized. Moderate aortic valve stenosis. Aortic valve area, by VTI measures 1.24 cm. Aortic valve mean gradient measures 29.5 mmHg.  7. Aortic dilatation noted. There is mild dilatation of the ascending aorta, measuring 40 mm.  8. The inferior vena cava is normal in size with <50% respiratory variability, suggesting right atrial pressure of 8 mmHg. FINDINGS  Left Ventricle: Left ventricular ejection fraction, by estimation, is 55 to 60%. The left ventricle has normal function. The left ventricle has no regional wall motion abnormalities. Definity contrast agent was given IV to delineate the left ventricular  endocardial borders. The left ventricular internal cavity size was normal in size. There is severe left ventricular hypertrophy. Left ventricular diastolic parameters are consistent with Grade II diastolic dysfunction (pseudonormalization). Right Ventricle: The right ventricular size is normal. No increase in right ventricular wall thickness. Right ventricular systolic function is mildly reduced. There is normal pulmonary artery systolic pressure. The tricuspid regurgitant velocity is 1.88 m/s, and with an assumed right atrial pressure of 8 mmHg, the estimated right ventricular systolic pressure is 22.1 mmHg. Left Atrium: Left atrial size was severely dilated. Right Atrium: Right atrial size was moderately dilated. Pericardium: There is no evidence  of pericardial effusion. Mitral Valve: The mitral valve is normal in structure. There is mild thickening of the mitral valve leaflet(s). Mild mitral valve regurgitation. No evidence of mitral valve stenosis. MV peak gradient, 5.9 mmHg. The mean mitral valve gradient is 2.0 mmHg. Tricuspid Valve: The tricuspid valve is normal in structure. Tricuspid valve regurgitation is trivial. No evidence of tricuspid stenosis. Aortic Valve: The aortic valve has been repaired/replaced. Aortic valve regurgitation is not visualized. Moderate aortic stenosis is present. Aortic valve mean gradient measures 29.5 mmHg. Aortic valve peak gradient measures 48.7 mmHg. Aortic valve area,  by VTI measures 1.24 cm. There is a bioprosthetic valve present in the aortic position. Pulmonic Valve: The pulmonic valve was normal in structure. Pulmonic valve regurgitation is mild. No evidence of pulmonic stenosis. Aorta: Aortic dilatation noted. There is mild dilatation of the ascending aorta, measuring 40 mm. Venous: The inferior vena cava is normal in size with less than 50% respiratory variability, suggesting right atrial pressure of 8 mmHg. IAS/Shunts: No atrial level shunt detected by color flow Doppler.  LEFT VENTRICLE PLAX 2D LVIDd:         4.20 cm   Diastology LVIDs:         2.80 cm   LV e' medial:    4.78 cm/s LV PW:         2.00 cm   LV E/e' medial:  20.9 LV IVS:        2.00 cm   LV e' lateral:   7.15 cm/s LVOT diam:     2.00 cm   LV E/e' lateral: 14.0 LV SV:         100 LV SV Index:   43 LVOT Area:     3.14 cm  RIGHT VENTRICLE RV Basal diam:  5.30 cm LEFT ATRIUM              Index        RIGHT ATRIUM           Index LA diam:        6.10 cm  2.60 cm/m   RA Area:     25.90 cm LA Vol (A2C):   103.0  ml 43.85 ml/m  RA Volume:   88.40 ml  37.64 ml/m LA Vol (A4C):   119.0 ml 50.67 ml/m LA Biplane Vol: 117.0 ml 49.81 ml/m  AORTIC VALVE                     PULMONIC VALVE AV Area (Vmax):    1.14 cm      PV Vmax:       1.20 m/s AV Area  (Vmean):   1.05 cm      PV Peak grad:  5.8 mmHg AV Area (VTI):     1.24 cm AV Vmax:           349.00 cm/s AV Vmean:          251.500 cm/s AV VTI:            0.805 m AV Peak Grad:      48.7 mmHg AV Mean Grad:      29.5 mmHg LVOT Vmax:         127.00 cm/s LVOT Vmean:        84.300 cm/s LVOT VTI:          0.318 m LVOT/AV VTI ratio: 0.40  AORTA Ao Root diam: 3.50 cm Ao Asc diam:  4.00 cm MITRAL VALVE                TRICUSPID VALVE MV Area (PHT): 2.73 cm     TR Peak grad:   14.1 mmHg MV Area VTI:   2.65 cm     TR Vmax:        188.00 cm/s MV Peak grad:  5.9 mmHg MV Mean grad:  2.0 mmHg     SHUNTS MV Vmax:       1.21 m/s     Systemic VTI:  0.32 m MV Vmean:      68.9 cm/s    Systemic Diam: 2.00 cm MV Decel Time: 278 msec MV E velocity: 100.00 cm/s MV A velocity: 98.50 cm/s MV E/A ratio:  1.02 Lorine Bears MD Electronically signed by Lorine Bears MD Signature Date/Time: 08/21/2022/3:55:45 PM    Final    DG Chest Port 1 View  Result Date: 08/20/2022 CLINICAL DATA:  Chest pain, hypotension, weakness. Atrial fibrillation. EXAM: PORTABLE CHEST 1 VIEW COMPARISON:  03/08/2022 and CT chest 04/07/2020. FINDINGS: Trachea is midline. Heart is enlarged, stable. Thoracic aorta is calcified. Lungs are clear. No pleural. IMPRESSION: No acute findings. Electronically Signed   By: Leanna Battles M.D.   On: 08/20/2022 13:43    Microbiology: Recent Results (from the past 240 hour(s))  SARS Coronavirus 2 by RT PCR (hospital order, performed in [facility] hospital lab) *cepheid single result test* Anterior Nasal Swab     Status: None   Collection Time: 09/15/22  3:36 AM   Specimen: Anterior Nasal Swab  Result Value Ref Range Status   SARS Coronavirus 2 by RT PCR NEGATIVE NEGATIVE Final    Comment: (NOTE) SARS-CoV-2 target nucleic acids are NOT DETECTED.  The SARS-CoV-2 RNA is generally detectable in upper and lower respiratory specimens during the acute phase of infection. The lowest concentration of SARS-CoV-2 viral  copies this assay can detect is 250 copies / mL. A negative result does not preclude SARS-CoV-2 infection and should not be used as the sole basis for treatment or other patient management decisions.  A negative result may occur with improper specimen collection / handling, submission of specimen other than nasopharyngeal swab, presence of viral mutation(s) within  the areas targeted by this assay, and inadequate number of viral copies (<250 copies / mL). A negative result must be combined with clinical observations, patient history, and epidemiological information.  Fact Sheet for Patients:   RoadLapTop.co.za  Fact Sheet for Healthcare Providers: http://kim-miller.com/  This test is not yet approved or  cleared by the [place] FDA and has been authorized for detection and/or diagnosis of SARS-CoV-2 by FDA under an Emergency Use Authorization (EUA).  This EUA will remain in effect (meaning this test can be used) for the duration of the COVID-19 declaration under Section 564(b)(1) of the Act, 21 U.S.C. section 360bbb-3(b)(1), unless the authorization is terminated or revoked sooner.  Performed at [facility], [address] [place], [place] [address]   Culture, blood (routine x 2)     Status: None (Preliminary result)   Collection Time: 09/15/22  5:40 AM   Specimen: BLOOD  Result Value Ref Range Status   Specimen Description BLOOD RIGHT ARM  Final   Special Requests   Final    BOTTLES DRAWN AEROBIC AND ANAEROBIC Blood Culture adequate volume   Culture   Final    NO GROWTH 1 DAY Performed at [facility], [address], [place], [place] [address]    Report Status PENDING  Incomplete  Culture, blood (routine x 2)     Status: None (Preliminary result)   Collection Time: 09/15/22  5:40 AM   Specimen: BLOOD  Result Value Ref Range Status   Specimen Description BLOOD LEFT HAND  Final   Special Requests   Final     BOTTLES DRAWN AEROBIC AND ANAEROBIC Blood Culture adequate volume   Culture   Final    NO GROWTH < 24 HOURS Performed at [facility], [address] [place], [place] [address]    Report Status PENDING  Incomplete     Labs: Basic Metabolic Panel: Recent Labs  Lab 09/15/22 0336 09/16/22 0510  NA 136 138  K 2.9* 3.7  CL 99 105  CO2 25 24  GLUCOSE 242* 163*  BUN 29* 24*  CREATININE 1.66* 1.30*  CALCIUM 8.8* 8.6*   Liver Function Tests: Recent Labs  Lab 09/15/22 0336  AST 27  ALT 23  ALKPHOS 93  BILITOT 0.6  PROT 8.0  ALBUMIN 3.9   No results for input(s): "LIPASE", "AMYLASE" in the last 168 hours. No results for input(s): "AMMONIA" in the last 168 hours. CBC: Recent Labs  Lab 09/15/22 0336  WBC 11.0*  NEUTROABS 7.1  HGB 10.4*  HCT 34.0*  MCV 79.4*  PLT 350   Cardiac Enzymes: No results for input(s): "CKTOTAL", "CKMB", "CKMBINDEX", "TROPONINI" in the last 168 hours. BNP: BNP (last 3 results) Recent Labs    08/24/22 0826 09/06/22 2348 09/15/22 0336  BNP 236.2* 205.0* 172.2*    ProBNP (last 3 results) No results for input(s): "PROBNP" in the last 8760 hours.  CBG: Recent Labs  Lab 09/15/22 1627 09/15/22 2130 09/15/22 2150 09/16/22 0748 09/16/22 1133  GLUCAP 155* 171* 165* 153* 190*       Signed:  Silvano Bilis MD.  [facility] 09/16/2022, 12:40 PM

## 2022-09-16 NOTE — Progress Notes (Signed)
Patient has a HH recommendation per physical therapy, and is uninsured.  Referral sent to [facility] for charity. Referral sent to [facility] at [facility] for charity walker.

## 2022-09-17 LAB — CULTURE, BLOOD (ROUTINE X 2)
Culture: NO GROWTH
Special Requests: ADEQUATE

## 2022-09-19 LAB — CULTURE, BLOOD (ROUTINE X 2)
Culture: NO GROWTH
Special Requests: ADEQUATE

## 2022-09-20 LAB — CULTURE, BLOOD (ROUTINE X 2)

## 2022-10-07 ENCOUNTER — Other Ambulatory Visit: Payer: Self-pay

## 2022-10-07 ENCOUNTER — Emergency Department: Payer: Medicaid Other

## 2022-10-07 ENCOUNTER — Inpatient Hospital Stay
Admission: EM | Admit: 2022-10-07 | Discharge: 2022-10-10 | DRG: 287 | Disposition: A | Discharge status: Other Acute Inpatient Hospital | Payer: Medicaid Other | Attending: Internal Medicine | Admitting: Internal Medicine

## 2022-10-07 DIAGNOSIS — G8929 Other chronic pain: Secondary | ICD-10-CM | POA: Diagnosis present

## 2022-10-07 DIAGNOSIS — Z79899 Other long term (current) drug therapy: Secondary | ICD-10-CM

## 2022-10-07 DIAGNOSIS — K219 Gastro-esophageal reflux disease without esophagitis: Secondary | ICD-10-CM | POA: Insufficient documentation

## 2022-10-07 DIAGNOSIS — Z79890 Hormone replacement therapy (postmenopausal): Secondary | ICD-10-CM

## 2022-10-07 DIAGNOSIS — Z5986 Financial insecurity: Secondary | ICD-10-CM

## 2022-10-07 DIAGNOSIS — R079 Chest pain, unspecified: Principal | ICD-10-CM | POA: Diagnosis present

## 2022-10-07 DIAGNOSIS — I440 Atrioventricular block, first degree: Secondary | ICD-10-CM | POA: Diagnosis present

## 2022-10-07 DIAGNOSIS — Z91148 Patient's other noncompliance with medication regimen for other reason: Secondary | ICD-10-CM

## 2022-10-07 DIAGNOSIS — E1140 Type 2 diabetes mellitus with diabetic neuropathy, unspecified: Secondary | ICD-10-CM | POA: Diagnosis present

## 2022-10-07 DIAGNOSIS — I130 Hypertensive heart and chronic kidney disease with heart failure and stage 1 through stage 4 chronic kidney disease, or unspecified chronic kidney disease: Secondary | ICD-10-CM | POA: Diagnosis present

## 2022-10-07 DIAGNOSIS — I352 Nonrheumatic aortic (valve) stenosis with insufficiency: Secondary | ICD-10-CM | POA: Diagnosis present

## 2022-10-07 DIAGNOSIS — E876 Hypokalemia: Secondary | ICD-10-CM | POA: Diagnosis present

## 2022-10-07 DIAGNOSIS — Z7984 Long term (current) use of oral hypoglycemic drugs: Secondary | ICD-10-CM

## 2022-10-07 DIAGNOSIS — Z8249 Family history of ischemic heart disease and other diseases of the circulatory system: Secondary | ICD-10-CM

## 2022-10-07 DIAGNOSIS — G4733 Obstructive sleep apnea (adult) (pediatric): Secondary | ICD-10-CM | POA: Diagnosis present

## 2022-10-07 DIAGNOSIS — N189 Chronic kidney disease, unspecified: Secondary | ICD-10-CM | POA: Diagnosis present

## 2022-10-07 DIAGNOSIS — E785 Hyperlipidemia, unspecified: Secondary | ICD-10-CM | POA: Diagnosis present

## 2022-10-07 DIAGNOSIS — I5032 Chronic diastolic (congestive) heart failure: Secondary | ICD-10-CM | POA: Diagnosis present

## 2022-10-07 DIAGNOSIS — N179 Acute kidney failure, unspecified: Secondary | ICD-10-CM | POA: Diagnosis present

## 2022-10-07 DIAGNOSIS — Z953 Presence of xenogenic heart valve: Secondary | ICD-10-CM

## 2022-10-07 DIAGNOSIS — I25119 Atherosclerotic heart disease of native coronary artery with unspecified angina pectoris: Principal | ICD-10-CM | POA: Diagnosis present

## 2022-10-07 DIAGNOSIS — D509 Iron deficiency anemia, unspecified: Secondary | ICD-10-CM

## 2022-10-07 DIAGNOSIS — E1122 Type 2 diabetes mellitus with diabetic chronic kidney disease: Secondary | ICD-10-CM | POA: Diagnosis present

## 2022-10-07 DIAGNOSIS — I480 Paroxysmal atrial fibrillation: Secondary | ICD-10-CM | POA: Diagnosis present

## 2022-10-07 DIAGNOSIS — I4819 Other persistent atrial fibrillation: Secondary | ICD-10-CM | POA: Diagnosis present

## 2022-10-07 DIAGNOSIS — R0789 Other chest pain: Secondary | ICD-10-CM | POA: Diagnosis present

## 2022-10-07 DIAGNOSIS — I712 Thoracic aortic aneurysm, without rupture, unspecified: Secondary | ICD-10-CM | POA: Insufficient documentation

## 2022-10-07 DIAGNOSIS — D72829 Elevated white blood cell count, unspecified: Secondary | ICD-10-CM | POA: Diagnosis present

## 2022-10-07 DIAGNOSIS — T82857D Stenosis of cardiac prosthetic devices, implants and grafts, subsequent encounter: Secondary | ICD-10-CM

## 2022-10-07 DIAGNOSIS — E039 Hypothyroidism, unspecified: Secondary | ICD-10-CM | POA: Diagnosis present

## 2022-10-07 DIAGNOSIS — Z7901 Long term (current) use of anticoagulants: Secondary | ICD-10-CM

## 2022-10-07 DIAGNOSIS — I4510 Unspecified right bundle-branch block: Secondary | ICD-10-CM | POA: Diagnosis present

## 2022-10-07 DIAGNOSIS — N183 Chronic kidney disease, stage 3 unspecified: Secondary | ICD-10-CM | POA: Diagnosis present

## 2022-10-07 DIAGNOSIS — E1165 Type 2 diabetes mellitus with hyperglycemia: Secondary | ICD-10-CM | POA: Diagnosis present

## 2022-10-07 DIAGNOSIS — N1831 Chronic kidney disease, stage 3a: Secondary | ICD-10-CM | POA: Diagnosis present

## 2022-10-07 DIAGNOSIS — Z952 Presence of prosthetic heart valve: Secondary | ICD-10-CM

## 2022-10-07 DIAGNOSIS — I350 Nonrheumatic aortic (valve) stenosis: Secondary | ICD-10-CM

## 2022-10-07 HISTORY — DX: Other specified abnormal findings of blood chemistry: R79.89

## 2022-10-07 HISTORY — DX: Hyperlipidemia, unspecified: E78.5

## 2022-10-07 HISTORY — DX: Chronic kidney disease, stage 3 unspecified: N18.30

## 2022-10-07 HISTORY — DX: Thoracic aortic aneurysm, without rupture, unspecified: I71.20

## 2022-10-07 HISTORY — DX: Anemia, unspecified: D64.9

## 2022-10-07 HISTORY — DX: Essential (primary) hypertension: I10

## 2022-10-07 HISTORY — DX: Chronic diastolic (congestive) heart failure: I50.32

## 2022-10-07 HISTORY — DX: Other persistent atrial fibrillation: I48.19

## 2022-10-07 HISTORY — DX: Unspecified right bundle-branch block: I45.10

## 2022-10-07 HISTORY — DX: Other chronic pain: G89.29

## 2022-10-07 HISTORY — DX: Obstructive sleep apnea (adult) (pediatric): G47.33

## 2022-10-07 HISTORY — DX: Other nonrheumatic aortic valve disorders: I35.8

## 2022-10-07 LAB — CBC
HCT: 30.3 % — ABNORMAL LOW (ref 39.0–52.0)
Hemoglobin: 9.2 g/dL — ABNORMAL LOW (ref 13.0–17.0)
MCH: 23.9 pg — ABNORMAL LOW (ref 26.0–34.0)
MCHC: 30.4 g/dL (ref 30.0–36.0)
MCV: 78.7 fL — ABNORMAL LOW (ref 80.0–100.0)
Platelets: 288 10*3/uL (ref 150–400)
RBC: 3.85 MIL/uL — ABNORMAL LOW (ref 4.22–5.81)
RDW: 14.8 % (ref 11.5–15.5)
WBC: 11.1 10*3/uL — ABNORMAL HIGH (ref 4.0–10.5)
nRBC: 0 % (ref 0.0–0.2)

## 2022-10-07 LAB — BASIC METABOLIC PANEL
Anion gap: 12 (ref 5–15)
BUN: 39 mg/dL — ABNORMAL HIGH (ref 6–20)
CO2: 29 mmol/L (ref 22–32)
Calcium: 9.2 mg/dL (ref 8.9–10.3)
Chloride: 95 mmol/L — ABNORMAL LOW (ref 98–111)
Creatinine, Ser: 1.5 mg/dL — ABNORMAL HIGH (ref 0.61–1.24)
GFR, Estimated: 53 mL/min — ABNORMAL LOW (ref >60)
Glucose, Bld: 235 mg/dL — ABNORMAL HIGH (ref 70–99)
Potassium: 2.9 mmol/L — ABNORMAL LOW (ref 3.5–5.1)
Sodium: 136 mmol/L (ref 135–145)

## 2022-10-07 LAB — BRAIN NATRIURETIC PEPTIDE: B Natriuretic Peptide: 300.1 pg/mL — ABNORMAL HIGH (ref 0.0–100.0)

## 2022-10-07 LAB — TROPONIN I (HIGH SENSITIVITY)
Troponin I (High Sensitivity): 63 ng/L — ABNORMAL HIGH (ref <18)
Troponin I (High Sensitivity): 73 ng/L — ABNORMAL HIGH (ref <18)
Troponin I (High Sensitivity): 76 ng/L — ABNORMAL HIGH (ref <18)

## 2022-10-07 MED ORDER — SUCRALFATE 1 G PO TABS
1.0000 g | ORAL_TABLET | Freq: Once | ORAL | Status: AC
  Administered 2022-10-07: 1 g via ORAL
  Filled 2022-10-07: qty 1

## 2022-10-07 MED ORDER — FAMOTIDINE 20 MG PO TABS
20.0000 mg | ORAL_TABLET | Freq: Once | ORAL | Status: AC
  Administered 2022-10-07: 20 mg via ORAL
  Filled 2022-10-07: qty 1

## 2022-10-07 MED ORDER — POTASSIUM CHLORIDE 10 MEQ/100ML IV SOLN
10.0000 meq | Freq: Once | INTRAVENOUS | Status: AC
  Administered 2022-10-07: 10 meq via INTRAVENOUS
  Filled 2022-10-07: qty 100

## 2022-10-07 MED ORDER — NITROGLYCERIN 0.4 MG SL SUBL
0.4000 mg | SUBLINGUAL_TABLET | Freq: Once | SUBLINGUAL | Status: AC
  Administered 2022-10-07: 0.4 mg via SUBLINGUAL
  Filled 2022-10-07: qty 1

## 2022-10-07 MED ORDER — POTASSIUM CHLORIDE 10 MEQ/100ML IV SOLN
10.0000 meq | Freq: Once | INTRAVENOUS | Status: AC
  Administered 2022-10-07: 10 meq via INTRAVENOUS

## 2022-10-07 MED ORDER — ALUM & MAG HYDROXIDE-SIMETH 200-200-20 MG/5ML PO SUSP
15.0000 mL | Freq: Once | ORAL | Status: AC
  Administered 2022-10-07: 15 mL via ORAL
  Filled 2022-10-07: qty 30

## 2022-10-07 NOTE — ED Provider Notes (Signed)
[facility] Provider Note    Event Date/Time   First MD Initiated Contact with Patient 10/07/22 2135     (approximate)   History   Chest Pain   HPI  HPI obtained via video interpreter  Craig Reese is a 62 y.o. male with a history of CAD status post bypass, paroxysmal atrial fibrillation on Xarelto and amiodarone, diastolic CHF, type 2 diabetes, and hypothyroidism who presents with chest pain.  The patient states that the pain is in his central chest.  He cannot describe call with the pain.  He states it is associated with shortness of breath.  He reports that he has had this pain persistently for 6 years.  He states that this pain is identical to what he has presented with to the ED before.  He had 2 episodes of it earlier today that resolved on their own and then it started again about 7 hours ago and has been persistent since that time.  He states in the past sometimes Carafate has helped although recently has not been helping as much.  I reviewed the past medical records.  The patient was admitted multiple times last month.  Most recently he was admitted between 4/29 and 4/30 somnolent appearing and with hypotension.  He was admitted from 4/20 to 4/22 with chest pain, and between 4/7 and 4/9 and between 4/3 and 4/5.  CT angio of the chest on 4/29 was negative for acute findings.   Physical Exam   Triage Vital Signs: ED Triage Vitals  Enc Vitals Group     BP 10/07/22 1450 116/69     Pulse Rate 10/07/22 1450 79     Resp 10/07/22 1450 20     Temp 10/07/22 1450 98.7 F (37.1 C)     Temp Source 10/07/22 1450 Oral     SpO2 10/07/22 1450 94 %     Weight 10/07/22 1448 269 lb 10 oz (122.3 kg)     Height 10/07/22 1448 5\' 10"  (1.778 m)     Head Circumference --      Peak Flow --      Pain Score 10/07/22 1450 10     Pain Loc --      Pain Edu? --      Excl. in GC? --     Most recent vital signs: Vitals:   10/07/22 2247 10/07/22 2312  BP:  115/64 123/67  Pulse: 77 75  Resp:  20  Temp:    SpO2: 100% 99%     General: Awake, no distress.  CV:  Good peripheral perfusion.  Resp:  Normal effort.  Abd:  No distention.  Other:  No significant peripheral edema.   ED Results / Procedures / Treatments   Labs (all labs ordered are listed, but only abnormal results are displayed) Labs Reviewed  BASIC METABOLIC PANEL - Abnormal; Notable for the following components:      Result Value   Potassium 2.9 (*)    Chloride 95 (*)    Glucose, Bld 235 (*)    BUN 39 (*)    Creatinine, Ser 1.50 (*)    GFR, Estimated 53 (*)    All other components within normal limits  CBC - Abnormal; Notable for the following components:   WBC 11.1 (*)    RBC 3.85 (*)    Hemoglobin 9.2 (*)    HCT 30.3 (*)    MCV 78.7 (*)    MCH 23.9 (*)    All  other components within normal limits  TROPONIN I (HIGH SENSITIVITY) - Abnormal; Notable for the following components:   Troponin I (High Sensitivity) 63 (*)    All other components within normal limits  TROPONIN I (HIGH SENSITIVITY) - Abnormal; Notable for the following components:   Troponin I (High Sensitivity) 73 (*)    All other components within normal limits  BRAIN NATRIURETIC PEPTIDE  TROPONIN I (HIGH SENSITIVITY)     EKG  ED ECG REPORT I, Dionne Bucy, the attending physician, personally viewed and interpreted this ECG.  Date: 10/07/2022 EKG Time: 1446 Rate: 77 Rhythm: normal sinus rhythm QRS Axis: normal Intervals: RBBB ST/T Wave abnormalities: LVH with repolarization abnormality Narrative Interpretation: no evidence of acute ischemia; no significant change when compared EKG of 09/15/2022    RADIOLOGY  Chest x-ray: I independently viewed and interpreted the images; there is possible mild edema but no focal consolidation   PROCEDURES:  Critical Care performed: No  Procedures   MEDICATIONS ORDERED IN ED: Medications  potassium chloride 10 mEq in 100 mL IVPB (has no  administration in time range)  potassium chloride 10 mEq in 100 mL IVPB (10 mEq Intravenous New Bag/Given 10/07/22 2242)  sucralfate (CARAFATE) tablet 1 g (1 g Oral Given 10/07/22 2243)  alum & mag hydroxide-simeth (MAALOX/MYLANTA) 200-200-20 MG/5ML suspension 15 mL (15 mLs Oral Given 10/07/22 2243)  famotidine (PEPCID) tablet 20 mg (20 mg Oral Given 10/07/22 2243)     IMPRESSION / MDM / ASSESSMENT AND PLAN / ED COURSE  I reviewed the triage vital signs and the nursing notes.  62 year old male with PMH as noted above including CAD, atrial fibrillation, and CHF, as well as chronic chest pain with multiple negative workups in the past and multiple recent inpatient admissions presents with an exacerbation of his chest pain.  The patient states that the pain is identical to the chronic pain he has been having for several years.  Physical exam is unremarkable for acute findings, and the patient is overall well-appearing.  EKG shows no acute abnormality.  Chest x-ray is negative.  Differential diagnosis includes, but is not limited to, exacerbation of chronic chest pain, GERD, musculoskeletal pain.  I have a very low suspicion for ACS.  There is no clinical evidence for PE given the lack of tachycardia or hypoxia.  The patient is on anticoagulation and has had multiple negative PE workups previously with a similar presentation.  Initial troponin was 63, similar to prior.  Repeat went to 18 which is not a significant change.  Labs are also significant for mild hypokalemia.  I will obtain a third troponin as the patient is still having chest pain.  I have ordered a GI cocktail as well as potassium repletion.  Given that the patient states that this is identical to his chronic pain and with the numerous prior admissions and negative workups, I do not think that there is a strong indication to admit the patient unless he has intractable pain or the troponin rises significantly.  We will reassess after medication  and repeat troponin.  Patient's presentation is most consistent with acute presentation with potential threat to life or bodily function.  The patient is on the cardiac monitor to evaluate for evidence of arrhythmia and/or significant heart rate changes.   ----------------------------------------- 11:13 PM on 10/07/2022 -----------------------------------------  Repeat troponin is pending.  Have signed the patient out to the oncoming ED physician Dr. Sidney Ace.  FINAL CLINICAL IMPRESSION(S) / ED DIAGNOSES   Final diagnoses:  Nonspecific  chest pain     Rx / DC Orders   ED Discharge Orders     None        Note:  This document was prepared using Dragon voice recognition software and may include unintentional dictation errors.    Dionne Bucy, MD 10/07/22 [phone]

## 2022-10-07 NOTE — ED Triage Notes (Signed)
First Nurse Note:  Pt via [facility] from home. Pt c/o sub sternal chest pain since this 2019 20 G R AC  324 ASA  1in Nitro to L chest  135/70 79 HR  100%

## 2022-10-07 NOTE — ED Triage Notes (Addendum)
Pt here via [facility] with cp. Pt states the pain is centered radiates to his arms. Pt states pain is tightness in his chest. Pt has a nitro patch to left chest. Pt denies NVD.

## 2022-10-08 ENCOUNTER — Other Ambulatory Visit: Payer: Self-pay

## 2022-10-08 ENCOUNTER — Encounter: Payer: Self-pay | Admitting: Internal Medicine

## 2022-10-08 ENCOUNTER — Inpatient Hospital Stay: Payer: Medicaid Other

## 2022-10-08 DIAGNOSIS — R079 Chest pain, unspecified: Secondary | ICD-10-CM

## 2022-10-08 DIAGNOSIS — I5032 Chronic diastolic (congestive) heart failure: Secondary | ICD-10-CM | POA: Diagnosis present

## 2022-10-08 DIAGNOSIS — R0789 Other chest pain: Secondary | ICD-10-CM

## 2022-10-08 DIAGNOSIS — E1165 Type 2 diabetes mellitus with hyperglycemia: Secondary | ICD-10-CM | POA: Diagnosis present

## 2022-10-08 DIAGNOSIS — I130 Hypertensive heart and chronic kidney disease with heart failure and stage 1 through stage 4 chronic kidney disease, or unspecified chronic kidney disease: Secondary | ICD-10-CM | POA: Diagnosis present

## 2022-10-08 DIAGNOSIS — I352 Nonrheumatic aortic (valve) stenosis with insufficiency: Secondary | ICD-10-CM | POA: Diagnosis present

## 2022-10-08 DIAGNOSIS — I440 Atrioventricular block, first degree: Secondary | ICD-10-CM | POA: Diagnosis present

## 2022-10-08 DIAGNOSIS — N1831 Chronic kidney disease, stage 3a: Secondary | ICD-10-CM | POA: Diagnosis present

## 2022-10-08 DIAGNOSIS — E785 Hyperlipidemia, unspecified: Secondary | ICD-10-CM | POA: Diagnosis present

## 2022-10-08 DIAGNOSIS — I712 Thoracic aortic aneurysm, without rupture, unspecified: Secondary | ICD-10-CM

## 2022-10-08 DIAGNOSIS — E1140 Type 2 diabetes mellitus with diabetic neuropathy, unspecified: Secondary | ICD-10-CM | POA: Diagnosis present

## 2022-10-08 DIAGNOSIS — I4819 Other persistent atrial fibrillation: Secondary | ICD-10-CM | POA: Diagnosis present

## 2022-10-08 DIAGNOSIS — I25119 Atherosclerotic heart disease of native coronary artery with unspecified angina pectoris: Secondary | ICD-10-CM | POA: Diagnosis present

## 2022-10-08 DIAGNOSIS — I25110 Atherosclerotic heart disease of native coronary artery with unstable angina pectoris: Secondary | ICD-10-CM

## 2022-10-08 DIAGNOSIS — K219 Gastro-esophageal reflux disease without esophagitis: Secondary | ICD-10-CM | POA: Diagnosis present

## 2022-10-08 DIAGNOSIS — I480 Paroxysmal atrial fibrillation: Secondary | ICD-10-CM

## 2022-10-08 DIAGNOSIS — D72829 Elevated white blood cell count, unspecified: Secondary | ICD-10-CM | POA: Diagnosis present

## 2022-10-08 DIAGNOSIS — E876 Hypokalemia: Secondary | ICD-10-CM | POA: Diagnosis present

## 2022-10-08 DIAGNOSIS — Z953 Presence of xenogenic heart valve: Secondary | ICD-10-CM | POA: Diagnosis not present

## 2022-10-08 DIAGNOSIS — G8929 Other chronic pain: Secondary | ICD-10-CM | POA: Diagnosis present

## 2022-10-08 DIAGNOSIS — D509 Iron deficiency anemia, unspecified: Secondary | ICD-10-CM | POA: Diagnosis present

## 2022-10-08 DIAGNOSIS — I350 Nonrheumatic aortic (valve) stenosis: Secondary | ICD-10-CM

## 2022-10-08 DIAGNOSIS — G4733 Obstructive sleep apnea (adult) (pediatric): Secondary | ICD-10-CM | POA: Diagnosis present

## 2022-10-08 DIAGNOSIS — I10 Essential (primary) hypertension: Secondary | ICD-10-CM

## 2022-10-08 DIAGNOSIS — T82857D Stenosis of cardiac prosthetic devices, implants and grafts, subsequent encounter: Secondary | ICD-10-CM | POA: Diagnosis not present

## 2022-10-08 DIAGNOSIS — I4510 Unspecified right bundle-branch block: Secondary | ICD-10-CM | POA: Diagnosis present

## 2022-10-08 DIAGNOSIS — E1122 Type 2 diabetes mellitus with diabetic chronic kidney disease: Secondary | ICD-10-CM | POA: Diagnosis present

## 2022-10-08 DIAGNOSIS — N179 Acute kidney failure, unspecified: Secondary | ICD-10-CM | POA: Diagnosis present

## 2022-10-08 DIAGNOSIS — E039 Hypothyroidism, unspecified: Secondary | ICD-10-CM | POA: Diagnosis present

## 2022-10-08 LAB — HEPARIN LEVEL (UNFRACTIONATED)
Heparin Unfractionated: 0.24 IU/mL — ABNORMAL LOW (ref 0.30–0.70)
Heparin Unfractionated: 0.43 IU/mL (ref 0.30–0.70)

## 2022-10-08 LAB — CBG MONITORING, ED
Glucose-Capillary: 197 mg/dL — ABNORMAL HIGH (ref 70–99)
Glucose-Capillary: 251 mg/dL — ABNORMAL HIGH (ref 70–99)

## 2022-10-08 LAB — GLUCOSE, CAPILLARY
Glucose-Capillary: 172 mg/dL — ABNORMAL HIGH (ref 70–99)
Glucose-Capillary: 193 mg/dL — ABNORMAL HIGH (ref 70–99)

## 2022-10-08 LAB — COMPREHENSIVE METABOLIC PANEL
ALT: 17 U/L (ref 0–44)
AST: 24 U/L (ref 15–41)
Albumin: 3.6 g/dL (ref 3.5–5.0)
Alkaline Phosphatase: 104 U/L (ref 38–126)
Anion gap: 10 (ref 5–15)
BUN: 32 mg/dL — ABNORMAL HIGH (ref 6–20)
CO2: 29 mmol/L (ref 22–32)
Calcium: 8.8 mg/dL — ABNORMAL LOW (ref 8.9–10.3)
Chloride: 100 mmol/L (ref 98–111)
Creatinine, Ser: 1.26 mg/dL — ABNORMAL HIGH (ref 0.61–1.24)
GFR, Estimated: >60 mL/min (ref >60)
Glucose, Bld: 235 mg/dL — ABNORMAL HIGH (ref 70–99)
Potassium: 3.5 mmol/L (ref 3.5–5.1)
Sodium: 139 mmol/L (ref 135–145)
Total Bilirubin: 0.7 mg/dL (ref 0.3–1.2)
Total Protein: 7.4 g/dL (ref 6.5–8.1)

## 2022-10-08 LAB — TROPONIN I (HIGH SENSITIVITY)
Troponin I (High Sensitivity): 63 ng/L — ABNORMAL HIGH (ref <18)
Troponin I (High Sensitivity): 58 ng/L — ABNORMAL HIGH (ref <18)
Troponin I (High Sensitivity): 72 ng/L — ABNORMAL HIGH (ref <18)

## 2022-10-08 LAB — APTT
aPTT: 29 seconds (ref 24–36)
aPTT: 55 seconds — ABNORMAL HIGH (ref 24–36)

## 2022-10-08 LAB — PROTIME-INR
INR: 1.2 (ref 0.8–1.2)
Prothrombin Time: 15.1 seconds (ref 11.4–15.2)

## 2022-10-08 LAB — MAGNESIUM: Magnesium: 2.3 mg/dL (ref 1.7–2.4)

## 2022-10-08 MED ORDER — CANAGLIFLOZIN 100 MG PO TABS
300.0000 mg | ORAL_TABLET | Freq: Every day | ORAL | Status: DC
  Administered 2022-10-08 – 2022-10-10 (×3): 300 mg via ORAL
  Filled 2022-10-08 (×3): qty 3

## 2022-10-08 MED ORDER — RIVAROXABAN 20 MG PO TABS: 20.0000 mg | ORAL_TABLET | Freq: Every day | ORAL | Status: DC

## 2022-10-08 MED ORDER — GABAPENTIN 300 MG PO CAPS
300.0000 mg | ORAL_CAPSULE | Freq: Three times a day (TID) | ORAL | Status: DC
  Administered 2022-10-08 – 2022-10-10 (×6): 300 mg via ORAL
  Filled 2022-10-08 (×6): qty 1

## 2022-10-08 MED ORDER — MORPHINE SULFATE (PF) 4 MG/ML IV SOLN
4.0000 mg | Freq: Once | INTRAVENOUS | Status: AC
  Administered 2022-10-08: 4 mg via INTRAVENOUS
  Filled 2022-10-08: qty 1

## 2022-10-08 MED ORDER — FAMOTIDINE 20 MG PO TABS
20.0000 mg | ORAL_TABLET | Freq: Two times a day (BID) | ORAL | Status: DC
  Administered 2022-10-08 – 2022-10-10 (×5): 20 mg via ORAL
  Filled 2022-10-08 (×5): qty 1

## 2022-10-08 MED ORDER — HEPARIN SODIUM (PORCINE) 5000 UNIT/ML IJ SOLN
5000.0000 [IU] | Freq: Three times a day (TID) | INTRAMUSCULAR | Status: DC
  Administered 2022-10-08: 5000 [IU] via SUBCUTANEOUS
  Filled 2022-10-08: qty 1

## 2022-10-08 MED ORDER — INSULIN GLARGINE-YFGN 100 UNIT/ML ~~LOC~~ SOLN
10.0000 [IU] | Freq: Every day | SUBCUTANEOUS | Status: DC
  Administered 2022-10-08 – 2022-10-09 (×2): 10 [IU] via SUBCUTANEOUS
  Filled 2022-10-08 (×3): qty 0.1

## 2022-10-08 MED ORDER — DICLOFENAC SODIUM 1 % EX GEL
2.0000 g | Freq: Four times a day (QID) | CUTANEOUS | Status: DC
  Administered 2022-10-08 – 2022-10-10 (×7): 2 g via TOPICAL
  Filled 2022-10-08: qty 100

## 2022-10-08 MED ORDER — HEPARIN BOLUS VIA INFUSION
4000.0000 [IU] | Freq: Once | INTRAVENOUS | Status: AC
  Administered 2022-10-08: 4000 [IU] via INTRAVENOUS
  Filled 2022-10-08: qty 4000

## 2022-10-08 MED ORDER — SODIUM CHLORIDE 0.9% FLUSH
3.0000 mL | Freq: Two times a day (BID) | INTRAVENOUS | Status: DC
  Administered 2022-10-08 – 2022-10-09 (×3): 3 mL via INTRAVENOUS

## 2022-10-08 MED ORDER — HEPARIN BOLUS VIA INFUSION
1500.0000 [IU] | Freq: Once | INTRAVENOUS | Status: AC
  Administered 2022-10-08: 1500 [IU] via INTRAVENOUS
  Filled 2022-10-08: qty 1500

## 2022-10-08 MED ORDER — ASPIRIN 81 MG PO CHEW
81.0000 mg | CHEWABLE_TABLET | Freq: Every day | ORAL | Status: DC
  Administered 2022-10-08 – 2022-10-10 (×3): 81 mg via ORAL
  Filled 2022-10-08 (×3): qty 1

## 2022-10-08 MED ORDER — AMIODARONE HCL 200 MG PO TABS
200.0000 mg | ORAL_TABLET | Freq: Every day | ORAL | Status: DC
  Administered 2022-10-08 – 2022-10-10 (×3): 200 mg via ORAL
  Filled 2022-10-08 (×3): qty 1

## 2022-10-08 MED ORDER — OXYCODONE HCL 5 MG PO TABS
5.0000 mg | ORAL_TABLET | ORAL | Status: DC | PRN
  Administered 2022-10-08 – 2022-10-09 (×3): 5 mg via ORAL
  Filled 2022-10-08 (×3): qty 1

## 2022-10-08 MED ORDER — SODIUM CHLORIDE 0.9 % IV SOLN: INTRAVENOUS | Status: DC

## 2022-10-08 MED ORDER — POTASSIUM CHLORIDE CRYS ER 20 MEQ PO TBCR
40.0000 meq | EXTENDED_RELEASE_TABLET | Freq: Once | ORAL | Status: AC
  Administered 2022-10-08: 40 meq via ORAL
  Filled 2022-10-08: qty 2

## 2022-10-08 MED ORDER — PANTOPRAZOLE SODIUM 20 MG PO TBEC
20.0000 mg | DELAYED_RELEASE_TABLET | Freq: Two times a day (BID) | ORAL | Status: DC
  Administered 2022-10-08 – 2022-10-10 (×5): 20 mg via ORAL
  Filled 2022-10-08 (×5): qty 1

## 2022-10-08 MED ORDER — ACETAMINOPHEN 325 MG PO TABS: 650.0000 mg | ORAL_TABLET | ORAL | Status: DC | PRN

## 2022-10-08 MED ORDER — ISOSORBIDE MONONITRATE ER 30 MG PO TB24
30.0000 mg | ORAL_TABLET | Freq: Every day | ORAL | Status: DC
  Administered 2022-10-08 – 2022-10-10 (×3): 30 mg via ORAL
  Filled 2022-10-08 (×3): qty 1

## 2022-10-08 MED ORDER — HEPARIN (PORCINE) 25000 UT/250ML-% IV SOLN
1600.0000 [IU]/h | INTRAVENOUS | Status: DC
  Administered 2022-10-08: 1400 [IU]/h via INTRAVENOUS
  Administered 2022-10-09: 1600 [IU]/h via INTRAVENOUS
  Filled 2022-10-08 (×2): qty 250

## 2022-10-08 MED ORDER — ATORVASTATIN CALCIUM 80 MG PO TABS
80.0000 mg | ORAL_TABLET | Freq: Every evening | ORAL | Status: DC
  Administered 2022-10-08: 80 mg via ORAL
  Filled 2022-10-08: qty 1

## 2022-10-08 MED ORDER — SUCRALFATE 1 GM/10ML PO SUSP
1.0000 g | Freq: Three times a day (TID) | ORAL | Status: DC
  Administered 2022-10-08 – 2022-10-10 (×8): 1 g via ORAL
  Filled 2022-10-08 (×11): qty 10

## 2022-10-08 MED ORDER — ASPIRIN 81 MG PO CHEW
81.0000 mg | CHEWABLE_TABLET | ORAL | Status: AC
  Administered 2022-10-09: 81 mg via ORAL
  Filled 2022-10-08: qty 1

## 2022-10-08 MED ORDER — GABAPENTIN 300 MG PO CAPS: 300.0000 mg | ORAL_CAPSULE | Freq: Two times a day (BID) | ORAL | Status: DC

## 2022-10-08 MED ORDER — SODIUM CHLORIDE 0.9% FLUSH: 3.0000 mL | INTRAVENOUS | Status: DC | PRN

## 2022-10-08 MED ORDER — INSULIN ASPART 100 UNIT/ML IJ SOLN
0.0000 [IU] | Freq: Three times a day (TID) | INTRAMUSCULAR | Status: DC
  Administered 2022-10-08: 2 [IU] via SUBCUTANEOUS
  Administered 2022-10-08: 5 [IU] via SUBCUTANEOUS
  Administered 2022-10-08 – 2022-10-09 (×2): 2 [IU] via SUBCUTANEOUS
  Administered 2022-10-10: 1 [IU] via SUBCUTANEOUS
  Filled 2022-10-08 (×5): qty 1

## 2022-10-08 MED ORDER — LEVOTHYROXINE SODIUM 100 MCG PO TABS
300.0000 ug | ORAL_TABLET | Freq: Every day | ORAL | Status: DC
  Administered 2022-10-08 – 2022-10-10 (×3): 300 ug via ORAL
  Filled 2022-10-08: qty 6
  Filled 2022-10-08 (×2): qty 3

## 2022-10-08 MED ORDER — SODIUM CHLORIDE 0.9 % IV SOLN: 250.0000 mL | INTRAVENOUS | Status: DC | PRN

## 2022-10-08 NOTE — ED Notes (Signed)
ED Provider at bedside. 

## 2022-10-08 NOTE — Assessment & Plan Note (Addendum)
Holding Xarelto.  Continue amiodarone.  Currently in sinus rhythm. Restart heparin drip.

## 2022-10-08 NOTE — ED Provider Notes (Signed)
Patient signed out to me pending 3rd top and reassessment.   3rd trop is flat. Patient is having ongoing pain, that is worse when he ambulates to the bathroom. Did give nitro with no change. Repeat EKG is unchanged.   Given patients hx and ongoing symptoms will admit. Patient was accepted by the hospitalist.    Georga Hacking, MD 10/08/22 [phone]

## 2022-10-08 NOTE — Consult Note (Deleted)
ANTICOAGULATION CONSULT NOTE  Pharmacy Consult for IV Heparin Indication: chest pain/ACS  Patient Measurements: Height: 5\' 10"  (177.8 cm) Weight: 122.3 kg (269 lb 10 oz) IBW/kg (Calculated) : 73 Heparin Dosing Weight: 100.6 kg  Labs: Recent Labs    10/07/22 1450 10/07/22 1722 10/07/22 2246 10/08/22 0811  HGB 9.2*  --   --   --   HCT 30.3*  --   --   --   PLT 288  --   --   --   CREATININE 1.50*  --   --  1.26*  TROPONINIHS 63* 73* 76* 63*   Estimated Creatinine Clearance: 82.8 mL/min (A) (by C-G formula based on SCr of 1.26 mg/dL (H)).  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   Chronic chest pain    Chronic heart failure with preserved ejection fraction (HFpEF) (HCC)    CKD (chronic kidney disease), stage III (HCC)    Coronary artery disease    a. 2015 s/p CABG x 1 (VG->OM); b. 04/2020 PET CT: apical, apical inf, mid inf, and basal inf ischemia. EF 41%; c. 05/2020 Cath: Sev OM4 dzs, patent graft, mod diff dzs; d. 07/2022 Cath [facility]): LM nl, LAD 40-50, LCX nl, OM4 80, RCA 50-60p, VG->OM 50p->Med rx.   Diabetes mellitus type II, non insulin dependent (HCC) 11/17/2018   Elevated troponin (chronic)    Essential hypertension    Hyperlipidemia LDL goal <70    Hypothyroidism 11/17/2018   Normocytic anemia    OSA (obstructive sleep apnea)    a. not on CPAP.   Persistent atrial fibrillation (HCC)    a. CHA2DS2VASc = 4-->xarelto/amio.   RBBB    Thoracic aortic aneurysm (HCC)    a. 08/2022 CT - 4.1cm.    Medications:  Rivaroxaban listed on prior to admission medication list. However, nothing on dispense report. Potential concern over non-compliance due to financial issues  Assessment: 62 y/o M with medical history as above and including CAD s/p single bypass graft, LHC 07/2022 with stable CAD and no culprit lesion, Afib prescribed rivaroxaban, diastolic CHF, DM, hypothyroidism, right parotid mass  following with [facility] ENT, CKD admitted with chest pain and concerns for ACS. Pharmacy consulted to initiate and manage heparin infusion for ACS.  Baseline CBC notable for anemia which appears consistent with patient's baseline. Baseline aPTT, INR, and heparin level are pending.  Goal of Therapy:  Heparin level 0.3-0.7 units/ml aPTT 66 - 102 seconds Monitor platelets by anticoagulation protocol: Yes   Plan:  --Heparin 4000 unit IV bolus followed by continuous infusion at 1400 units/hr --Baseline labs pending. Anticipate possible interference of rivaroxaban on heparin level if patient has been taking. If interference detected, will follow aPTT until correlation established with heparin level and then switch over to heparin level monitoring --Daily CBC per protocol while on IV heparin  Craig Reese 10/08/2022,10:52 AM

## 2022-10-08 NOTE — Assessment & Plan Note (Signed)
-  Continue Lipitor °

## 2022-10-08 NOTE — Consult Note (Signed)
ANTICOAGULATION CONSULT NOTE  Pharmacy Consult for IV Heparin Indication: chest pain/ACS  Patient Measurements: Height: 5\' 10"  (177.8 cm) Weight: 122.3 kg (269 lb 10 oz) IBW/kg (Calculated) : 73 Heparin Dosing Weight: 100.6 kg  Labs: Recent Labs    10/07/22 1450 10/07/22 1722 10/07/22 2246 10/08/22 0811 10/08/22 1117  HGB 9.2*  --   --   --   --   HCT 30.3*  --   --   --   --   PLT 288  --   --   --   --   APTT  --   --   --   --  29  LABPROT  --   --   --   --  15.1  INR  --   --   --   --  1.2  HEPARINUNFRC  --   --   --   --  0.24*  CREATININE 1.50*  --   --  1.26*  --   TROPONINIHS 63*   < > 76* 63* 58*   < > = values in this interval not displayed.    Estimated Creatinine Clearance: 82.8 mL/min (A) (by C-G formula based on SCr of 1.26 mg/dL (H)).  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   Chronic chest pain    Chronic heart failure with preserved ejection fraction (HFpEF) (HCC)    a. 08/2022 Echo: Ef 55-60%, no rwma, GrII DD, nl RV fxn, sev dil LA, mod dil RA, mild MR, mod AS (mean grad 29.16mmHg, AVA 1.24cm^2).   CKD (chronic kidney disease), stage III (HCC)    Coronary artery disease    a. 2015 s/p CABG x 1 (VG->OM); b. 04/2020 PET CT: apical, apical inf, mid inf, and basal inf ischemia. EF 41%; c. 05/2020 Cath: Sev OM4 dzs, patent graft, mod diff dzs; d. 07/2022 Cath [facility]): LM nl, LAD 40-50, LCX nl, OM4 80, RCA 50-60p, VG->OM 50p->Med rx.   Diabetes mellitus type II, non insulin dependent (HCC) 11/17/2018   Elevated troponin (chronic)    Essential hypertension    Hyperlipidemia LDL goal <70    Hypothyroidism 11/17/2018   Normocytic anemia    OSA (obstructive sleep apnea)    a. not on CPAP.   Persistent atrial fibrillation (HCC)    a. CHA2DS2VASc = 4-->xarelto/amio.   RBBB    Thoracic aortic aneurysm (HCC)    a. 08/2022 CT - 4.1cm.    Medications:  Rivaroxaban listed on prior to  admission medication list. However, nothing on dispense report. Potential concern over non-compliance due to financial issues  Assessment: 62 y/o M with medical history as above and including CAD s/p single bypass graft, LHC 07/2022 with stable CAD and no culprit lesion, Afib prescribed rivaroxaban, diastolic CHF, DM, hypothyroidism, right parotid mass following with [facility]NT, CKD admitted with chest pain and concerns for ACS. Pharmacy consulted to initiate and manage heparin infusion for ACS.  Baseline CBC notable for anemia which appears consistent with patient's baseline. Baseline aPTT 29s, INR 1.2, and heparin level 0.24 (may be slightly elevated secondary to Sims heparin injection from AM).  Goal of Therapy:  Heparin level 0.3-0.7 units/ml aPTT 66 - 102 seconds Monitor platelets by anticoagulation protocol: Yes   Plan:  --Heparin 4000 unit IV bolus followed by continuous infusion at 1400 units/hr --Baseline heparin level slightly elevated. Un-clear if from residual interference from Xarelto or [place] heparin dose  administered in AM. Will check both aPTT and HL 6 hours after drip start. Will follow aPTT until correlation established with heparin level and then switch over to heparin level monitoring --Daily CBC per protocol while on IV heparin  [name] 10/08/2022,12:20 PM

## 2022-10-08 NOTE — Progress Notes (Signed)
Progress Note   Patient: Craig Reese ZOX:[id number] DOB: 04/02/1961 DOA: 10/07/2022     0 DOS: the patient was seen and examined on 10/08/2022   Brief hospital course: 62 y.o. male with medical history significant of  coronary artery disease with single bypass grafting,LHC 07/2022 with stable CAD no culprit lesion , paroxysmal A-fib on Xarelto and amiodarone, chronic diastolic CHF, type 2 diabetes, hypothyroidism,osa, and right parotid mass followed by [facility] with plans of surgery,OSA,CKDIII who presented ED with complaint of central chest pain. Patient states he has had chest pain for six years and has pain frequently but today has persistent pain that lasted for hours at home that was stronger that his usual pain.  He notes no associated symptoms of sob/ n/v/diaphoresis or abdominal pain. However noted in the recent past he has has syncope associate with his pain. He notes that morphine given in ed relieves his pain but does not last.   5/22.  Patient having chest pain this morning.  Morphine seem to help but worse when he is walking around.  Case discussed with cardiology.  Assessment and Plan: * Chest pain With history of coronary artery disease.  There is a reproducible component.  Cardiology placed him on heparin drip and will consider cardiac catheterization tomorrow.  Added diclofenac gel.  Paroxysmal atrial fibrillation (HCC) Holding Xarelto.  Continue amiodarone.  Currently in sinus rhythm.  Hypokalemia Replaced  Uncontrolled type 2 diabetes mellitus with hyperglycemia, without long-term current use of insulin (HCC) A1c elevated at 9.3.  Patient on Invokana.  Will add Semglee insulin.  HLD (hyperlipidemia) Continue Lipitor  CKD (chronic kidney disease) stage 3, GFR 30-59 ml/min (HCC) CKD stage III A.  Last creatinine 1.26 with a GFR greater than 60.  Thoracic aortic aneurysm (HCC) Measuring 4.1 cm.  Chronic diastolic CHF (congestive heart failure) (HCC) Does  not seem to be any signs of heart failure currently.        Subjective: Patient complaining of chest pain this morning.  Patient states that he has been having chest pain for a long period of time and nobody can figure out what is going on.  Had it 3 times yesterday and decided to come into the hospital for further evaluation.  Physical Exam: Vitals:   10/08/22 0627 10/08/22 0812 10/08/22 1130 10/08/22 1428  BP: 101/65 110/72 112/66 (!) 152/75  Pulse: 78 80 81 80  Resp: 15 16 16 17   Temp: 98.2 F (36.8 C) 98 F (36.7 C) 98.2 F (36.8 C)   TempSrc:      SpO2: 94% 96% 91% 100%  Weight:      Height:       Physical Exam HENT:     Head: Normocephalic.     Mouth/Throat:     Pharynx: No oropharyngeal exudate.  Eyes:     General: Lids are normal.     Conjunctiva/sclera: Conjunctivae normal.  Cardiovascular:     Rate and Rhythm: Normal rate and regular rhythm.     Heart sounds: Murmur heard.     Systolic murmur is present with a grade of 2/6.  Pulmonary:     Breath sounds: Examination of the right-lower field reveals decreased breath sounds. Examination of the left-lower field reveals decreased breath sounds. Decreased breath sounds and wheezing present. No rhonchi or rales.  Chest:     Comments: Pain to palpation over sternum. Abdominal:     Palpations: Abdomen is soft.     Tenderness: There is no abdominal tenderness.  Musculoskeletal:     Right lower leg: Swelling present.     Left lower leg: Swelling present.  Skin:    General: Skin is warm.     Findings: No rash.  Neurological:     Mental Status: He is alert and oriented to person, place, and time.     Data Reviewed: Creatinine 1.26, glucose 235, potassium 3.5, hemoglobin A1c 9.3  Disposition: Status is: Inpatient Remains inpatient appropriate because: Cardiology to consider cardiac catheterization tomorrow  Planned Discharge Destination: Home    Time spent: 28 minutes  Author: Alford Highland,  MD 10/08/2022 3:15 PM  For on call review [email or web address].

## 2022-10-08 NOTE — H&P (Addendum)
History and Physical    Shiv Lauer WUJ:[id number] DOB: [birth year] DOA: 10/07/2022  PCP: Katrina Stack, MD  Patient coming from: home  I have personally briefly reviewed patient's old medical records in [facility]  Chief Complaint:  refractory chest pain   HPI: Craig Reese is a 62 y.o. male with medical history significant of  coronary artery disease with single bypass grafting,LHC 07/2022 with stable CAD no culprit lesion , paroxysmal A-fib on Xarelto and amiodarone, chronic diastolic CHF, type 2 diabetes, hypothyroidism,osa, and right parotid mass followed by [facility] with plans of surgery,OSA,CKDIII who presented ED with complaint of central chest pain. Patient states he has had chest pain for six years and has pain frequently but today has persistent pain that lasted for hours at home that was stronger that his usual pain.  He notes no associated symptoms of sob/ n/v/diaphoresis or abdominal pain. However noted in the recent past he has has syncope associate with his pain. He notes that morphine given in ed relieves his pain but does not last.  Of note patient has interim history of admission for similar symptoms 09/15/22 with discharge 09/16/22 patient  case discussed with cardiology who noted since no new finding on initial ischemic evaluation with recent stable cath rec outpatient management.    ED Course:  Vitals: afeb, bp 116/69, HR79, rr 20 sat 94%  EKG: sinus RBBB Labs  Wbc 11.1, hgb 9.2 plt 288 Na 135, K 2.9, CO2 29, Gly 235, cr 1.5  Ce 95,62,13 cxr IMPRESSION: Low volumes with mild perihilar interstitial edema or infiltrates.  BNP 300, 172,  Tx pepcid, KCL , sulcruflate, morphine Review of Systems: As per HPI otherwise 10 point review of systems negative.   Past Medical History:  Diagnosis Date   AF (paroxysmal atrial fibrillation) (HCC) 06/06/2018   CAD (coronary artery disease) 05/13/2018   Chronic diastolic CHF (congestive heart  failure) (HCC) 11/17/2018   Coronary artery disease    Diabetes mellitus type II, non insulin dependent (HCC) 11/17/2018   Hypothyroidism 11/17/2018    Past Surgical History:  Procedure Laterality Date   CORONARY ARTERY BYPASS GRAFT     LEFT HEART CATH AND CORS/GRAFTS ANGIOGRAPHY N/A 05/17/2018   Procedure: LEFT HEART CATH AND CORS/GRAFTS ANGIOGRAPHY and possible PCI and stent;  Surgeon: Alwyn Pea, MD;  Location: [facility] INVASIVE CV LAB;  Service: Cardiovascular;  Laterality: N/A;     reports that he has never smoked. He has never used smokeless tobacco. He reports that he does not currently use alcohol. He reports that he does not use drugs.  No Known Allergies  Family History  Problem Relation Age of Onset   Heart disease Brother     Prior to Admission medications   Medication Sig Start Date End Date Taking? Authorizing Provider  amiodarone (PACERONE) 200 MG tablet Take 1 tablet (200 mg total) by mouth daily. 09/03/22  Yes Sunnie Nielsen, DO  atorvastatin (LIPITOR) 80 MG tablet Take 80 mg by mouth every evening.   Yes [provider]  canagliflozin (INVOKANA) 300 MG TABS tablet Take 300 mg by mouth daily before breakfast.   Yes [provider]  diclofenac Sodium (VOLTAREN) 1 % GEL Apply 2 g topically 4 (four) times daily as needed. 09/08/22  Yes Marrion Coy, MD  furosemide (LASIX) 40 MG tablet Take 1 tablet (40 mg total) by mouth daily as needed. for up to 3 days for increased leg swelling, shortness of breath, weight gain 5+ lbs  over 1-2 days. Seek medical care if these symptoms are not improving with increased dose. 08/26/22  Yes Sunnie Nielsen, DO  gabapentin (NEURONTIN) 300 MG capsule Take 300 mg by mouth 2 (two) times daily.   Yes [provider]  isosorbide mononitrate (IMDUR) 30 MG 24 hr tablet Take 1 tablet (30 mg total) by mouth daily. 09/08/22  Yes Marrion Coy, MD  levothyroxine (SYNTHROID) 300 MCG tablet Take 300 mcg by mouth daily before  breakfast. 05/10/22 05/10/23 Yes [provider]  lidocaine (XYLOCAINE) 5 % ointment Apply 1 Application topically 2 (two) times daily as needed. To chaest 09/08/22  Yes Marrion Coy, MD  pantoprazole (PROTONIX) 20 MG tablet Take 1 tablet (20 mg total) by mouth 2 (two) times daily. 03/09/22 10/08/22 Yes Sreenath, Sudheer B, MD  rivaroxaban (XARELTO) 20 MG TABS tablet Take 1 tablet (20 mg total) by mouth daily. 04/08/20  Yes Wieting, Richard, MD  sucralfate (CARAFATE) 1 GM/10ML suspension Take 1 g by mouth 4 (four) times daily -  with meals and at bedtime.   Yes [provider]    Physical Exam: Vitals:   10/08/22 0013 10/08/22 0051 10/08/22 0207 10/08/22 0405  BP: (!) 105/53 (!) 95/53 (!) 93/55 (!) 102/59  Pulse: 81 83 85 85  Resp: 18 18 18 15   Temp: 98.3 F (36.8 C)   98.1 F (36.7 C)  TempSrc:      SpO2: 96% 98% 99% 92%  Weight:      Height:        Constitutional: NAD, calm, comfortable Vitals:   10/08/22 0013 10/08/22 0051 10/08/22 0207 10/08/22 0405  BP: (!) 105/53 (!) 95/53 (!) 93/55 (!) 102/59  Pulse: 81 83 85 85  Resp: 18 18 18 15   Temp: 98.3 F (36.8 C)   98.1 F (36.7 C)  TempSrc:      SpO2: 96% 98% 99% 92%  Weight:      Height:       Eyes: PERRL, lids and conjunctivae normal ENMT: Mucous membranes are moist. Posterior pharynx clear of any exudate or lesions.Normal dentition.  Neck: normal, supple, no masses, no thyromegaly Respiratory: clear to auscultation bilaterally, no wheezing, no crackles. Normal respiratory effort. No accessory muscle use.  Cardiovascular: Regular rate and rhythm, no murmurs / rubs / gallops. No extremity edema. 2+ pedal pulses. No carotid bruits.  Abdomen: no tenderness, no masses palpated. No hepatosplenomegaly. Bowel sounds positive.  Musculoskeletal: no clubbing / cyanosis. No joint deformity upper and lower extremities. Good ROM, no contractures. Normal muscle tone.  Skin: no rashes, lesions, ulcers. No  induration Neurologic: CN 2-12 grossly intact. Sensation intact, DTR normal. Strength 5/5 in all 4.  Psychiatric: Normal judgment and insight. Alert and oriented x 3. Normal mood.    Labs on Admission: I have personally reviewed following labs and imaging studies  CBC: Recent Labs  Lab 10/07/22 1450  WBC 11.1*  HGB 9.2*  HCT 30.3*  MCV 78.7*  PLT 288   Basic Metabolic Panel: Recent Labs  Lab 10/07/22 1450  NA 136  K 2.9*  CL 95*  CO2 29  GLUCOSE 235*  BUN 39*  CREATININE 1.50*  CALCIUM 9.2   GFR: Estimated Creatinine Clearance: 69.5 mL/min (A) (by C-G formula based on SCr of 1.5 mg/dL (H)). Liver Function Tests: No results for input(s): "AST", "ALT", "ALKPHOS", "BILITOT", "PROT", "ALBUMIN" in the last 168 hours. No results for input(s): "LIPASE", "AMYLASE" in the last 168 hours. No results for input(s): "AMMONIA" in the last  168 hours. Coagulation Profile: No results for input(s): "INR", "PROTIME" in the last 168 hours. Cardiac Enzymes: No results for input(s): "CKTOTAL", "CKMB", "CKMBINDEX", "TROPONINI" in the last 168 hours. BNP (last 3 results) No results for input(s): "PROBNP" in the last 8760 hours. HbA1C: No results for input(s): "HGBA1C" in the last 72 hours. CBG: No results for input(s): "GLUCAP" in the last 168 hours. Lipid Profile: No results for input(s): "CHOL", "HDL", "LDLCALC", "TRIG", "CHOLHDL", "LDLDIRECT" in the last 72 hours. Thyroid Function Tests: No results for input(s): "TSH", "T4TOTAL", "FREET4", "T3FREE", "THYROIDAB" in the last 72 hours. Anemia Panel: No results for input(s): "VITAMINB12", "FOLATE", "FERRITIN", "TIBC", "IRON", "RETICCTPCT" in the last 72 hours. Urine analysis:    Component Value Date/Time   COLORURINE YELLOW (A) 09/15/2022 0336   APPEARANCEUR HAZY (A) 09/15/2022 0336   APPEARANCEUR Clear 09/22/2013 2248   LABSPEC 1.027 09/15/2022 0336   LABSPEC >1.060 09/22/2013 2248   PHURINE 6.0 09/15/2022 0336   GLUCOSEU >=500  (A) 09/15/2022 0336   GLUCOSEU Negative 09/22/2013 2248   HGBUR NEGATIVE 09/15/2022 0336   BILIRUBINUR NEGATIVE 09/15/2022 0336   BILIRUBINUR Negative 09/22/2013 2248   KETONESUR NEGATIVE 09/15/2022 0336   PROTEINUR 100 (A) 09/15/2022 0336   NITRITE NEGATIVE 09/15/2022 0336   LEUKOCYTESUR NEGATIVE 09/15/2022 0336   LEUKOCYTESUR Negative 09/22/2013 2248    Radiological Exams on Admission: DG Chest 2 View  Result Date: 10/07/2022 CLINICAL DATA:  cp EXAM: CHEST - 2 VIEW COMPARISON:  09/15/2022 FINDINGS: Low lung volumes. Mild perihilar interstitial edema or infiltrates are suspected. Heart size and mediastinal contours are within normal limits. Aortic Atherosclerosis (ICD10-170.0). No effusion. Sternotomy wires and CABG markers. IMPRESSION: Low volumes with mild perihilar interstitial edema or infiltrates. Electronically Signed   By: Corlis Leak M.D.   On: 10/07/2022 15:13    EKG: Independently reviewed. See above  Assessment/Plan    Chest pain, r/o ACS  - patient with frequent hospitalization  - Ce note be flat , EKG no hyperacute changes  - LHC 07/2022 with stable CAD no culprit lesion -however due to high risk patient is admitted for observation  -will admit to tele  -repeat  ce  -resume cardiac medications   Mild leukocytosis  -? Opacity on cxr -CT chest to be complete   OSA, -O2 qhs   Hfpef  -ef 55-60%  - no acute exacerbation  -resume home regimen   DMII with hyperglycemia -iss /fs ' -last A1c 92 4/24  Peripheral neuropathy -continue gabapentin   HLD Continue statin   GERD -ppi   P Afib  Resume home xarelto   Hypothyroidism  -resume synthroid   Hypokalemia -K 2.9 , replete prn   DVT prophylaxis: on xarelto Code Status: full/ as discussed per patient wishes in event of cardiac arrest  Family Communication: none at bedside Disposition Plan: patient  expected to be admitted less than 2 midnights  Consults called: n/a Admission status:  cardiac  tele   Lurline Del MD [facility]   If 7PM-7AM, please contact night-coverage [email or web address] Password [email or web address]  10/08/2022, 6:18 AM

## 2022-10-08 NOTE — Assessment & Plan Note (Addendum)
On low-dose replacement.

## 2022-10-08 NOTE — ED Notes (Signed)
Pt taken to CT.

## 2022-10-08 NOTE — H&P (View-Only) (Signed)
 Cardiology Consult    Patient ID: Craig Reese MRN: [id number], DOB/AGE: 12/13/1960   Admit date: 10/07/2022 Date of Consult: 10/08/2022  Primary Physician: Bermudez, Ana Laura, MD Primary Cardiologist: M. Yeung, MD Requesting Provider: R. Wieting  Patient Profile    Craig Reese is a 62 y.o. male with a history of CAD status post CABG x 1, aortic valve endocarditis/aortic insufficiency status post bioprosthetic aortic valve in 2015, hypertension, hyperlipidemia, diabetes, chronic kidney disease stage III, right bundle branch block, thoracic aortic aneurysm (4.1 cm April 2024), untreated sleep apnea, normocytic anemia, hypothyroidism, chronic chest pain, and chronically elevated troponins, who is being seen today for the evaluation of chest pain and troponin elevation at the request of Dr. Wieting.  Past Medical History   Past Medical History:  Diagnosis Date   Aortic valve endocarditis    a. 2015 s/p bioprosthetic AVR.   Chronic chest pain    Chronic heart failure with preserved ejection fraction (HFpEF) (HCC)    a. 08/2022 Echo: Ef 55-60%, no rwma, GrII DD, nl RV fxn, sev dil LA, mod dil RA, mild MR, mod AS (mean grad 29.5mmHg, AVA 1.24cm^2).   CKD (chronic kidney disease), stage III (HCC)    Coronary artery disease    a. 2015 s/p CABG x 1 (VG->OM); b. 04/2020 PET CT: apical, apical inf, mid inf, and basal inf ischemia. EF 41%; c. 05/2020 Cath: Sev OM4 dzs, patent graft, mod diff dzs; d. 07/2022 Cath ([facility]): LM nl, LAD 40-50, LCX nl, OM4 80, RCA 50-60p, VG->OM 50p->Med rx.   Diabetes mellitus type II, non insulin dependent (HCC) 11/17/2018   Elevated troponin (chronic)    Essential hypertension    Hyperlipidemia LDL goal <70    Hypothyroidism 11/17/2018   Normocytic anemia    OSA (obstructive sleep apnea)    a. not on CPAP.   Persistent atrial fibrillation (HCC)    a. CHA2DS2VASc = 4-->xarelto/amio.   RBBB    Thoracic aortic aneurysm (HCC)    a.  08/2022 CT - 4.1cm.    Past Surgical History:  Procedure Laterality Date   CORONARY ARTERY BYPASS GRAFT     LEFT HEART CATH AND CORS/GRAFTS ANGIOGRAPHY N/A 05/17/2018   Procedure: LEFT HEART CATH AND CORS/GRAFTS ANGIOGRAPHY and possible PCI and stent;  Surgeon: Callwood, Dwayne D, MD;  Location: [facility] INVASIVE CV LAB;  Service: Cardiovascular;  Laterality: N/A;     Allergies  No Known Allergies  History of Present Illness    62 y.o. male with a history of CAD status post CABG x 1, aortic valve endocarditis/aortic insufficiency status post bioprosthetic aortic valve in 2015, hypertension, hyperlipidemia, diabetes, chronic kidney disease stage III, right bundle branch block, thoracic aortic aneurysm (4.1 cm April 2024), untreated sleep apnea, normocytic anemia, hypothyroidism, chronic chest pain, and chronically elevated troponins.  As noted, he developed aortic valve endocarditis in 2015 and subsequently underwent bioprosthetic cardiac valve replacement with CABG x 1 with vein graft to the fourth obtuse marginal.  Over the past several years, has had multiple evaluations for chronic and somewhat atypical chest pain as well as chronically elevated troponin.  He has been followed at [facility] and underwent PET/CT in 2021 which showed largely inferior ischemia and was followed by diagnostic catheterization, which showed moderate disease but no targets for intervention.  He more recently underwent cath @ [facility] in March of this year, which showed stable anatomy compared to 2022, including a 50 to 60% proximal RCA stenosis, 50% proximal vein graft to   the obtuse marginal stenosis, and 40 to 50% LAD stenosis.  The previously bypassed OM 4 had 80% disease.  He was medically managed.  Unfortunately, he has since been admitted to [facility] with chest pain and troponin elevation x 2 (most recently 09/07/2022).  Troponin trends typically run in the 50s to 70s.  He was medically managed on both occasions w/ the addition of long  acting nitrate therapy, and resumption of amiodarone therapy as palpitations and paroxysmal atrial fibrillation were felt to be contributing to his symptoms.  Echo during early April admission showed normal LV function with grade 2 diastolic dysfunction.  He was in his usual state of health on May 21, when he began to experience episodic rest and exertional chest pain associated with palpitations and presyncope.  Each episode lasted about 10 minutes and resolved with rest and drinking cold water, or w/ sl NTG.  He notes this morning through interpreter services, that just of any movement might result in more chest pain.  Due to recurrent symptoms, he presented to the ED on the afternoon of 5/21.  Here, ECG notable for RBBB w/o acute changes.  HsTrops have been elevated @ 63  73  76  63, which is overall similar to troponin trends dating back to at least 2020.  Currently is chest pain-free.  Notes compliance with home medications.  Last Xarelto dose was May 21.  Current Medications     aspirin  81 mg Oral Daily   atorvastatin  80 mg Oral QPM   canagliflozin  300 mg Oral QAC breakfast   diclofenac Sodium  2 g Topical QID   famotidine  20 mg Oral BID   gabapentin  300 mg Oral TID   heparin  4,000 Units Intravenous Once   insulin aspart  0-9 Units Subcutaneous TID WC   isosorbide mononitrate  30 mg Oral Daily   levothyroxine  300 mcg Oral QAC breakfast   pantoprazole  20 mg Oral BID   sodium chloride flush  3 mL Intravenous Q12H   sucralfate  1 g Oral TID WC & HS    Family History    Family History  Problem Relation Age of Onset   Heart disease Brother    He indicated that the status of his brother is unknown.   Social History    Social History   Socioeconomic History   Marital status: Single    Spouse name: Not on file   Number of children: Not on file   Years of education: Not on file   Highest education level: Not on file  Occupational History   Not on file  Tobacco Use    Smoking status: Never   Smokeless tobacco: Never  Vaping Use   Vaping Use: Never used  Substance and Sexual Activity   Alcohol use: Not Currently   Drug use: Never   Sexual activity: Not Currently  Other Topics Concern   Not on file  Social History Narrative   Not on file   Social Determinants of Health   Financial Resource Strain: Medium Risk (05/13/2018)   Overall Financial Resource Strain (CARDIA)    Difficulty of Paying Living Expenses: Somewhat hard  Food Insecurity: Unknown (05/13/2018)   Hunger Vital Sign    Worried About Running Out of Food in the Last Year: Patient declined    Ran Out of Food in the Last Year: Patient declined  Transportation Needs: Unknown (05/13/2018)   PRAPARE - Transportation    Lack of Transportation (Medical):   Patient declined    Lack of Transportation (Non-Medical): Patient declined  Physical Activity: Unknown (05/13/2018)   Exercise Vital Sign    Days of Exercise per Week: Patient declined    Minutes of Exercise per Session: Patient declined  Stress: Stress Concern Present (05/13/2018)   [employer] - Occupational Stress Questionnaire    Feeling of Stress : To some extent  Social Connections: Not on file  Intimate Partner Violence: Not on file     Review of Systems    General:  No chills, fever, night sweats or weight changes.  Cardiovascular:  +++ chest pain, +++dyspnea on exertion, no edema, orthopnea, +++ palpitations, no paroxysmal nocturnal dyspnea. Dermatological: No rash, lesions/masses Respiratory: No cough, +++ dyspnea Urologic: No hematuria, dysuria Abdominal:   No nausea, vomiting, diarrhea, bright red blood per rectum, melena, or hematemesis Neurologic:  No visual changes, wkns, changes in mental status. All other systems reviewed and are otherwise negative except as noted above.  Physical Exam    Blood pressure 110/72, pulse 80, temperature 98 F (36.7 C), resp. rate 16, height 5' 10" (1.778  m), weight 122.3 kg, SpO2 96 %.  General: Pleasant, NAD Psych: Normal affect. Neuro: Alert and oriented X 3. Moves all extremities spontaneously. HEENT: Normal  Neck: Supple without bruits or JVD. Lungs:  Resp regular and unlabored, CTA. Heart: RRR no s3, s4, or murmurs. Abdomen: Soft, non-tender, non-distended, BS + x 4.  Extremities: No clubbing, cyanosis or edema. DP/PT2+, Radials 2+ and equal bilaterally.  Labs    Cardiac Enzymes Recent Labs  Lab 09/15/22 0722 10/07/22 1450 10/07/22 1722 10/07/22 2246 10/08/22 0811  TROPONINIHS 65* 63* 73* 76* 63*     BNP    Component Value Date/Time   BNP 300.1 (H) 10/07/2022 2246   Lab Results  Component Value Date   WBC 11.1 (H) 10/07/2022   HGB 9.2 (L) 10/07/2022   HCT 30.3 (L) 10/07/2022   MCV 78.7 (L) 10/07/2022   PLT 288 10/07/2022    Recent Labs  Lab 10/08/22 0811  NA 139  K 3.5  CL 100  CO2 29  BUN 32*  CREATININE 1.26*  CALCIUM 8.8*  PROT 7.4  BILITOT 0.7  ALKPHOS 104  ALT 17  AST 24  GLUCOSE 235*   Lab Results  Component Value Date   CHOL 96 09/07/2022   HDL 24 (L) 09/07/2022   LDLCALC 34 09/07/2022   TRIG 189 (H) 09/07/2022   Lab Results  Component Value Date   DDIMER 0.31 11/21/2018      Radiology Studies    CT CHEST WO CONTRAST  Result Date: 10/08/2022 CLINICAL DATA:  Pneumonia. Complication suspected. X-ray done. Previous CABG. History of congestive heart failure. Chest pain. EXAM: CT CHEST WITHOUT CONTRAST TECHNIQUE: Multidetector CT imaging of the chest was performed following the standard protocol without IV contrast. RADIATION DOSE REDUCTION: This exam was performed according to the departmental dose-optimization program which includes automated exposure control, adjustment of the mA and/or kV according to patient size and/or use of iterative reconstruction technique. COMPARISON:  Chest radiography yesterday. Previous chest CT 09/15/2022 FINDINGS: Cardiovascular: Previous median sternotomy  and CABG. Extensive coronary artery calcification and aortic atherosclerotic calcification. Extensive calcification of the aortic valve region. Maximal diameter of the ascending aorta is stable at 4.1 cm as seen previously. Mediastinum/Nodes: No mass or lymphadenopathy. No evidence of postsurgical complication. Lungs/Pleura: No pleural effusion. The lungs are clear. No infiltrate or collapse. Minimal right middle lobe scar. Minimal biapical   scarring. Upper Abdomen: Normal Musculoskeletal: No acute or significant bone finding. IMPRESSION: 1. No acute chest finding. No evidence of pneumonia. 2. Previous median sternotomy and CABG. Extensive coronary artery calcification and aortic atherosclerotic calcification. Extensive calcification of the aortic valve region. Maximal diameter of the ascending aorta is stable at 4.1 cm. No change in previous follow-up recommendations. Recommend annual imaging followup by CTA or MRA. This recommendation follows 2010 ACCF/AHA/AATS/ACR/ASA/SCA/SCAI/SIR/STS/SVM Guidelines for the Diagnosis and Management of Patients with Thoracic Aortic Disease. Circulation. 2010; 121: E266-e369. Aortic aneurysm NOS (ICD10-I71.9) Aortic Atherosclerosis (ICD10-I70.0). Electronically Signed   By: Mark  Shogry M.D.   On: 10/08/2022 08:06   DG Chest 2 View  Result Date: 10/07/2022 CLINICAL DATA:  cp EXAM: CHEST - 2 VIEW COMPARISON:  09/15/2022 FINDINGS: Low lung volumes. Mild perihilar interstitial edema or infiltrates are suspected. Heart size and mediastinal contours are within normal limits. Aortic Atherosclerosis (ICD10-170.0). No effusion. Sternotomy wires and CABG markers. IMPRESSION: Low volumes with mild perihilar interstitial edema or infiltrates. Electronically Signed   By: D  Hassell M.D.   On: 10/07/2022 15:13   CT Angio Chest PE W/Cm &/Or Wo Cm  Result Date: 09/15/2022 CLINICAL DATA:  Known thoracic aneurysm EXAM: CT ANGIOGRAPHY CHEST WITH CONTRAST TECHNIQUE: Multidetector CT imaging of  the chest was performed using the standard protocol during bolus administration of intravenous contrast. Multiplanar CT image reconstructions and MIPs were obtained to evaluate the vascular anatomy. RADIATION DOSE REDUCTION: This exam was performed according to the departmental dose-optimization program which includes automated exposure control, adjustment of the mA and/or kV according to patient size and/or use of iterative reconstruction technique. CONTRAST:  75mL OMNIPAQUE IOHEXOL 350 MG/ML SOLN COMPARISON:  08/24/2022 FINDINGS: Cardiovascular: Satisfactory opacification of the pulmonary arteries to the segmental level. No evidence of pulmonary embolism. Enlarged heart with left ventricular thickening. There is atheromatous calcification that is extensive with prior CABG. A saphenous in LIMA grafts are enhancing proximally. Heavy calcification of the aortic valve and root of the aorta. Mild dilatation of the ascending aorta 4 cm. Mediastinum/Nodes: Negative for adenopathy or mass Lungs/Pleura: Low volume chest with mild ground-glass density that is likely atelectasis. Upper Abdomen: No acute finding Musculoskeletal: No acute finding Review of the MIP images confirms the above findings. IMPRESSION: 1. No acute finding. No evidence of acute aortic syndrome. Mild dilatation of the ascending aorta measuring up to 4 cm in diameter. There is heavy calcification of the aortic valve and aortic root. 2. Cardiomegaly with left ventricular hypertrophy. Atherosclerosis with prior CABG. Electronically Signed   By: Jonathan  Watts M.D.   On: 09/15/2022 04:45   CT Head Wo Contrast  Result Date: 09/15/2022 CLINICAL DATA:  Mental status change with unknown cause EXAM: CT HEAD WITHOUT CONTRAST TECHNIQUE: Contiguous axial images were obtained from the base of the skull through the vertex without intravenous contrast. RADIATION DOSE REDUCTION: This exam was performed according to the departmental dose-optimization program which  includes automated exposure control, adjustment of the mA and/or kV according to patient size and/or use of iterative reconstruction technique. 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 No evidence of acute infarct, hemorrhage, hydrocephalus, or collection. Vascular: No hyperdense vessel or unexpected calcification. Skull: Normal. Negative for fracture or focal lesion. Sinuses/Orbits: No acute finding. 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 IMPRESSION: 1. No acute finding. 2. 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 3. 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 Electronically Signed   By: Jonathan  Watts M.D.   On: 09/15/2022 04:15   DG Chest Port 1 View  Result Date: 09/15/2022 CLINICAL DATA:  Altered mental status EXAM: PORTABLE CHEST 1 VIEW COMPARISON:  09/07/2022 FINDINGS: Cardiac shadow is enlarged but stable. Postsurgical changes are seen. Aortic calcifications are noted. Inspiratory effort is poor although no focal infiltrate is seen. No acute bony abnormality is noted. IMPRESSION: No acute abnormality noted. Electronically Signed   By: Mark  Lukens M.D.   On: 09/15/2022 03:54    ECG & Cardiac Imaging    Regular sinus rhythm, 77, right bundle branch block, LAD, LVH, prolonged QT- personally reviewed.  Assessment & Plan    1.  Coronary artery disease/precordial chest pain/chronically elevated troponin: Patient with a history of CAD status post CABG x 1 in the setting of AVR for aortic valve endocarditis in 2015.  He has had multiple admissions for chest pain since then with abnormal PET/CT in late 2021 with inferior ischemia and moderate RCA, LAD, and vein graft to OM for disease at that time and again in March 2024.  Admitted twice since his most recent  catheterization at [facility], with normal LV function by echo in early April.  He has chronically elevated troponins with trends in the 50s to 70s.  High suspicion for recurrent atrial fibrillation contributing to symptoms.  Presented back to the emergency department on May 21 with recurrent chest pain associated with palpitations and presyncope.  Troponins are again mildly elevated with a peak of 76 and otherwise flat trend.  ECG with right bundle branch block and otherwise no acute changes.  He has been in sinus rhythm on telemetry.  Currently chest pain-free.  Last dose of Xarelto was May 21.  With ongoing chest pain and troponin elevations, we will plan on relook right left heart cardiac catheterization with intravascular ultrasound to determine ischemic significance of previously ported moderate disease.  We will tentatively schedule him for tomorrow afternoon given last dose of Xarelto was yesterday.  Continue aspirin, statin, heparin, and nitrate therapy.  Blood pressure trends soft, which is limited escalation of GDMT in the past.  2.  Persistent atrial fibrillation: Reports compliance with amiodarone with last dose of Xarelto being taken yesterday.  Previously felt that recurrent A-fib and palpitations contributing to demand ischemia.  Sinus rhythm on presentation and on telemetry.  Known right bundle branch block with prolonged QT in that setting, though QT wider in the 580 range currently.  Amiodarone currently not ordered.  Will need to reconsider resumption of metoprolol therapy if blood pressure allows.  Xarelto currently on hold and heparin ordered.  3.  Essential hypertension: Blood pressure relatively soft.  Issues with hypotension on prior admissions resulting in discontinuation of prior home doses of chlorthalidone, HCTZ, losartan, metoprolol, and spironolactone.  4.  Hyperlipidemia: Continue statin therapy.  LDL was 34 in April.  5.  Type 2 diabetes mellitus: Insulin management per medicine  team.  6.  Stage III chronic kidney disease: Creatinine relatively stable at 1.26.  7.  Microcytic anemia: H&H lower than on prior admissions at 9.2/30.3.  Follow.  Risk Assessment/Risk Scores:     TIMI Risk Score for Unstable Angina or Non-ST Elevation MI:   The patient's TIMI risk score is 5, which indicates a 26% risk of all cause mortality, new or recurrent myocardial infarction or need for urgent revascularization in   the next 14 days.    CHA2DS2-VASc Score = 4   This indicates a 4.8% annual risk of stroke. The patient's score is based upon: CHF History: 1 HTN History: 1 Diabetes History: 1 Stroke History: 0 Vascular Disease History: 1 Age Score: 0 Gender Score: 0     Signed, Lateef Juncaj, NP 10/08/2022, 11:12 AM  For questions or updates, please contact   Please consult [email or web address] for contact info under Cardiology/STEMI.   

## 2022-10-08 NOTE — Consult Note (Addendum)
ANTICOAGULATION CONSULT NOTE  Pharmacy Consult for Heparin Infusion Indication: chest pain/ACS  Patient Measurements: Height: 5\' 10"  (177.8 cm) Weight: 122.3 kg (269 lb 10 oz) IBW/kg (Calculated) : 73 Heparin Dosing Weight: 100.6 kg  Labs: Recent Labs    10/07/22 1450 10/07/22 1722 10/08/22 0811 10/08/22 1117 10/08/22 1421  HGB 9.2*  --   --   --   --   HCT 30.3*  --   --   --   --   PLT 288  --   --   --   --   APTT  --   --   --  29  --   LABPROT  --   --   --  15.1  --   INR  --   --   --  1.2  --   HEPARINUNFRC  --   --   --  0.24*  --   CREATININE 1.50*  --  1.26*  --   --   TROPONINIHS 63*   < > 63* 58* 72*   < > = values in this interval not displayed.    Estimated Creatinine Clearance: 82.8 mL/min (A) (by C-G formula based on SCr of 1.26 mg/dL (H)).  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   Chronic chest pain    Chronic heart failure with preserved ejection fraction (HFpEF) (HCC)    a. 08/2022 Echo: Ef 55-60%, no rwma, GrII DD, nl RV fxn, sev dil LA, mod dil RA, mild MR, mod AS (mean grad 29.13mmHg, AVA 1.24cm^2).   CKD (chronic kidney disease), stage III (HCC)    Coronary artery disease    a. 2015 s/p CABG x 1 (VG->OM); b. 04/2020 PET CT: apical, apical inf, mid inf, and basal inf ischemia. EF 41%; c. 05/2020 Cath: Sev OM4 dzs, patent graft, mod diff dzs; d. 07/2022 Cath [facility]): LM nl, LAD 40-50, LCX nl, OM4 80, RCA 50-60p, VG->OM 50p->Med rx.   Diabetes mellitus type II, non insulin dependent (HCC) 11/17/2018   Elevated troponin (chronic)    Essential hypertension    Hyperlipidemia LDL goal <70    Hypothyroidism 11/17/2018   Normocytic anemia    OSA (obstructive sleep apnea)    a. not on CPAP.   Persistent atrial fibrillation (HCC)    a. CHA2DS2VASc = 4-->xarelto/amio.   RBBB    Thoracic aortic aneurysm (HCC)    a. 08/2022 CT - 4.1cm.    Medications:  Scheduled:   aspirin  81  mg Oral Daily   atorvastatin  80 mg Oral QPM   canagliflozin  300 mg Oral QAC breakfast   diclofenac Sodium  2 g Topical QID   famotidine  20 mg Oral BID   gabapentin  300 mg Oral TID   insulin aspart  0-9 Units Subcutaneous TID WC   insulin glargine-yfgn  10 Units Subcutaneous QHS   isosorbide mononitrate  30 mg Oral Daily   levothyroxine  300 mcg Oral QAC breakfast   pantoprazole  20 mg Oral BID   sodium chloride flush  3 mL Intravenous Q12H   sucralfate  1 g Oral TID WC & HS   Infusions:   heparin 1,400 Units/hr (10/08/22 1124)   PRN: acetaminophen, oxyCODONE  Assessment: Craig Reese is a 62 y.o. male presenting with chest pain and concerns for ACS. PMH significant for CAD (s/p CABG x1), CHF, AF, T2DM, hypothyroidism, CKD3. Patient was  prescribed rivaroxaban PTA. No dispenses listed on dispense report, and there is some concern for nonadherence due to financial issues. Patient received SQ heparin 5000 units x1 on 10/08/2022 @ 0641 which may explain the slight baseline elevation in HL. Pharmacy has been consulted to initiate and manage heparin infusion.   Baseline Labs: aPTT 29, HL 0.24, PT 15.1, INR 1.3, Hgb 9.2, Hct 30.3, Plt 288   Goal of Therapy:  Heparin level 0.3-0.7 units/ml aPTT 66 - 102 seconds Monitor platelets by anticoagulation protocol: Yes   Date Time aPTT/HL Rate/Comment  5/22 1808 55/0.43 1400/aPTT subtherapeutic  Plan:  Give 1500 unit bolus Increase heparin infusion to 1600 units/hr Check aPTT 6 hours after rate change Continue to monitor aPTT until HL and aPTT correlate.  Check HL daily for correlation until HL and aPTT correlate. Switch to HL monitoring once HL and aPTT correlate.  Continue to monitor H&H and platelets daily while on heparin infusion   Celene Squibb, PharmD PGY1 Pharmacy Resident 10/08/2022 4:25 PM

## 2022-10-08 NOTE — Assessment & Plan Note (Addendum)
With severe aortic stenosis on cardiac cath.  Patient with history of coronary artery disease.  There is a reproducible component also to his chest pain.  Cardiology recommended transfer to [facility] for evaluation of heart valve.  Patient accepted by Dr. Debroah Baller.  Awaiting bed availability.

## 2022-10-08 NOTE — Assessment & Plan Note (Addendum)
Patient seems euvolemic currently.

## 2022-10-08 NOTE — Assessment & Plan Note (Signed)
Measuring 4.1 cm.

## 2022-10-08 NOTE — Consult Note (Signed)
Cardiology Consult    Patient ID: Craig Reese MRN: [id number], DOB/AGE: May 12, 1961   Admit date: 10/07/2022 Date of Consult: 10/08/2022  Primary Physician: Katrina Stack, MD Primary Cardiologist: Neomia Dear, MD Requesting Provider: R. Wieting  Patient Profile    Craig Reese is a 62 y.o. male with a history of CAD status post CABG x 1, aortic valve endocarditis/aortic insufficiency status post bioprosthetic aortic valve in 2015, hypertension, hyperlipidemia, diabetes, chronic kidney disease stage III, right bundle branch block, thoracic aortic aneurysm (4.1 cm April 2024), untreated sleep apnea, normocytic anemia, hypothyroidism, chronic chest pain, and chronically elevated troponins, who is being seen today for the evaluation of chest pain and troponin elevation at the request of Dr. Renae Gloss.  Past Medical History   Past Medical History:  Diagnosis Date   Aortic valve endocarditis    a. 2015 s/p bioprosthetic AVR.   Chronic chest pain    Chronic heart failure with preserved ejection fraction (HFpEF) (HCC)    a. 08/2022 Echo: Ef 55-60%, no rwma, GrII DD, nl RV fxn, sev dil LA, mod dil RA, mild MR, mod AS (mean grad 29.71mmHg, AVA 1.24cm^2).   CKD (chronic kidney disease), stage III (HCC)    Coronary artery disease    a. 2015 s/p CABG x 1 (VG->OM); b. 04/2020 PET CT: apical, apical inf, mid inf, and basal inf ischemia. EF 41%; c. 05/2020 Cath: Sev OM4 dzs, patent graft, mod diff dzs; d. 07/2022 Cath [facility]): LM nl, LAD 40-50, LCX nl, OM4 80, RCA 50-60p, VG->OM 50p->Med rx.   Diabetes mellitus type II, non insulin dependent (HCC) 11/17/2018   Elevated troponin (chronic)    Essential hypertension    Hyperlipidemia LDL goal <70    Hypothyroidism 11/17/2018   Normocytic anemia    OSA (obstructive sleep apnea)    a. not on CPAP.   Persistent atrial fibrillation (HCC)    a. CHA2DS2VASc = 4-->xarelto/amio.   RBBB    Thoracic aortic aneurysm (HCC)    a.  08/2022 CT - 4.1cm.    Past Surgical History:  Procedure Laterality Date   CORONARY ARTERY BYPASS GRAFT     LEFT HEART CATH AND CORS/GRAFTS ANGIOGRAPHY N/A 05/17/2018   Procedure: LEFT HEART CATH AND CORS/GRAFTS ANGIOGRAPHY and possible PCI and stent;  Surgeon: Alwyn Pea, MD;  Location: [facility] INVASIVE CV LAB;  Service: Cardiovascular;  Laterality: N/A;     Allergies  No Known Allergies  History of Present Illness    62 y.o. male with a history of CAD status post CABG x 1, aortic valve endocarditis/aortic insufficiency status post bioprosthetic aortic valve in 2015, hypertension, hyperlipidemia, diabetes, chronic kidney disease stage III, right bundle branch block, thoracic aortic aneurysm (4.1 cm April 2024), untreated sleep apnea, normocytic anemia, hypothyroidism, chronic chest pain, and chronically elevated troponins.  As noted, he developed aortic valve endocarditis in 2015 and subsequently underwent bioprosthetic cardiac valve replacement with CABG x 1 with vein graft to the fourth obtuse marginal.  Over the past several years, has had multiple evaluations for chronic and somewhat atypical chest pain as well as chronically elevated troponin.  He has been followed at [facility] and underwent PET/CT in 2021 which showed largely inferior ischemia and was followed by diagnostic catheterization, which showed moderate disease but no targets for intervention.  He more recently underwent cath @ [facility] in March of this year, which showed stable anatomy compared to 2022, including a 50 to 60% proximal RCA stenosis, 50% proximal vein graft to  the obtuse marginal stenosis, and 40 to 50% LAD stenosis.  The previously bypassed OM 4 had 80% disease.  He was medically managed.  Unfortunately, he has since been admitted to [facility] with chest pain and troponin elevation x 2 (most recently 09/07/2022).  Troponin trends typically run in the 50s to 70s.  He was medically managed on both occasions w/ the addition of long  acting nitrate therapy, and resumption of amiodarone therapy as palpitations and paroxysmal atrial fibrillation were felt to be contributing to his symptoms.  Echo during early April admission showed normal LV function with grade 2 diastolic dysfunction.  He was in his usual state of health on May 21, when he began to experience episodic rest and exertional chest pain associated with palpitations and presyncope.  Each episode lasted about 10 minutes and resolved with rest and drinking cold water, or w/ sl NTG.  He notes this morning through interpreter services, that just of any movement might result in more chest pain.  Due to recurrent symptoms, he presented to the ED on the afternoon of 5/21.  Here, ECG notable for RBBB w/o acute changes.  HsTrops have been elevated @ 63  73  76  63, which is overall similar to troponin trends dating back to at least 2020.  Currently is chest pain-free.  Notes compliance with home medications.  Last Xarelto dose was May 21.  Current Medications     aspirin  81 mg Oral Daily   atorvastatin  80 mg Oral QPM   canagliflozin  300 mg Oral QAC breakfast   diclofenac Sodium  2 g Topical QID   famotidine  20 mg Oral BID   gabapentin  300 mg Oral TID   heparin  4,000 Units Intravenous Once   insulin aspart  0-9 Units Subcutaneous TID WC   isosorbide mononitrate  30 mg Oral Daily   levothyroxine  300 mcg Oral QAC breakfast   pantoprazole  20 mg Oral BID   sodium chloride flush  3 mL Intravenous Q12H   sucralfate  1 g Oral TID WC & HS    Family History    Family History  Problem Relation Age of Onset   Heart disease Brother    He indicated that the status of his brother is unknown.   Social History    Social History   Socioeconomic History   Marital status: Single    Spouse name: Not on file   Number of children: Not on file   Years of education: Not on file   Highest education level: Not on file  Occupational History   Not on file  Tobacco Use    Smoking status: Never   Smokeless tobacco: Never  Vaping Use   Vaping Use: Never used  Substance and Sexual Activity   Alcohol use: Not Currently   Drug use: Never   Sexual activity: Not Currently  Other Topics Concern   Not on file  Social History Narrative   Not on file   Social Determinants of Health   Financial Resource Strain: Medium Risk (05/13/2018)   Overall Financial Resource Strain (CARDIA)    Difficulty of Paying Living Expenses: Somewhat hard  Food Insecurity: Unknown (05/13/2018)   Hunger Vital Sign    Worried About Running Out of Food in the Last Year: Patient declined    Ran Out of Food in the Last Year: Patient declined  Transportation Needs: Unknown (05/13/2018)   PRAPARE - Administrator, Civil Service (Medical):  Patient declined    Lack of Transportation (Non-Medical): Patient declined  Physical Activity: Unknown (05/13/2018)   Exercise Vital Sign    Days of Exercise per Week: Patient declined    Minutes of Exercise per Session: Patient declined  Stress: Stress Concern Present (05/13/2018)   [employer] of Occupational Health - Occupational Stress Questionnaire    Feeling of Stress : To some extent  Social Connections: Not on file  Intimate Partner Violence: Not on file     Review of Systems    General:  No chills, fever, night sweats or weight changes.  Cardiovascular:  +++ chest pain, +++dyspnea on exertion, no edema, orthopnea, +++ palpitations, no paroxysmal nocturnal dyspnea. Dermatological: No rash, lesions/masses Respiratory: No cough, +++ dyspnea Urologic: No hematuria, dysuria Abdominal:   No nausea, vomiting, diarrhea, bright red blood per rectum, melena, or hematemesis Neurologic:  No visual changes, wkns, changes in mental status. All other systems reviewed and are otherwise negative except as noted above.  Physical Exam    Blood pressure 110/72, pulse 80, temperature 98 F (36.7 C), resp. rate 16, height 5\' 10"  (1.778  m), weight 122.3 kg, SpO2 96 %.  General: Pleasant, NAD Psych: Normal affect. Neuro: Alert and oriented X 3. Moves all extremities spontaneously. HEENT: Normal  Neck: Supple without bruits or JVD. Lungs:  Resp regular and unlabored, CTA. Heart: RRR no s3, s4, or murmurs. Abdomen: Soft, non-tender, non-distended, BS + x 4.  Extremities: No clubbing, cyanosis or edema. DP/PT2+, Radials 2+ and equal bilaterally.  Labs    Cardiac Enzymes Recent Labs  Lab 09/15/22 0722 10/07/22 1450 10/07/22 1722 10/07/22 2246 10/08/22 0811  TROPONINIHS 65* 63* 73* 76* 63*     BNP    Component Value Date/Time   BNP 300.1 (H) 10/07/2022 2246   Lab Results  Component Value Date   WBC 11.1 (H) 10/07/2022   HGB 9.2 (L) 10/07/2022   HCT 30.3 (L) 10/07/2022   MCV 78.7 (L) 10/07/2022   PLT 288 10/07/2022    Recent Labs  Lab 10/08/22 0811  NA 139  K 3.5  CL 100  CO2 29  BUN 32*  CREATININE 1.26*  CALCIUM 8.8*  PROT 7.4  BILITOT 0.7  ALKPHOS 104  ALT 17  AST 24  GLUCOSE 235*   Lab Results  Component Value Date   CHOL 96 09/07/2022   HDL 24 (L) 09/07/2022   LDLCALC 34 09/07/2022   TRIG 189 (H) 09/07/2022   Lab Results  Component Value Date   DDIMER 0.31 11/21/2018      Radiology Studies    CT CHEST WO CONTRAST  Result Date: 10/08/2022 CLINICAL DATA:  Pneumonia. Complication suspected. X-ray done. Previous CABG. History of congestive heart failure. Chest pain. EXAM: CT CHEST WITHOUT CONTRAST TECHNIQUE: Multidetector CT imaging of the chest was performed following the standard protocol without IV contrast. RADIATION DOSE REDUCTION: This exam was performed according to the departmental dose-optimization program which includes automated exposure control, adjustment of the mA and/or kV according to patient size and/or use of iterative reconstruction technique. COMPARISON:  Chest radiography yesterday. Previous chest CT 09/15/2022 FINDINGS: Cardiovascular: Previous median sternotomy  and CABG. Extensive coronary artery calcification and aortic atherosclerotic calcification. Extensive calcification of the aortic valve region. Maximal diameter of the ascending aorta is stable at 4.1 cm as seen previously. Mediastinum/Nodes: No mass or lymphadenopathy. No evidence of postsurgical complication. Lungs/Pleura: No pleural effusion. The lungs are clear. No infiltrate or collapse. Minimal right middle lobe scar. Minimal biapical  scarring. Upper Abdomen: Normal Musculoskeletal: No acute or significant bone finding. IMPRESSION: 1. No acute chest finding. No evidence of pneumonia. 2. Previous median sternotomy and CABG. Extensive coronary artery calcification and aortic atherosclerotic calcification. Extensive calcification of the aortic valve region. Maximal diameter of the ascending aorta is stable at 4.1 cm. No change in previous follow-up recommendations. Recommend annual imaging followup by CTA or MRA. This recommendation follows 2010 ACCF/AHA/AATS/ACR/ASA/SCA/SCAI/SIR/STS/SVM Guidelines for the Diagnosis and Management of Patients with Thoracic Aortic Disease. Circulation. 2010; 121: Z610-R604. Aortic aneurysm NOS (ICD10-I71.9) Aortic Atherosclerosis (ICD10-I70.0). Electronically Signed   By: Paulina Fusi M.D.   On: 10/08/2022 08:06   DG Chest 2 View  Result Date: 10/07/2022 CLINICAL DATA:  cp EXAM: CHEST - 2 VIEW COMPARISON:  09/15/2022 FINDINGS: Low lung volumes. Mild perihilar interstitial edema or infiltrates are suspected. Heart size and mediastinal contours are within normal limits. Aortic Atherosclerosis (ICD10-170.0). No effusion. Sternotomy wires and CABG markers. IMPRESSION: Low volumes with mild perihilar interstitial edema or infiltrates. Electronically Signed   By: Corlis Leak M.D.   On: 10/07/2022 15:13   CT Angio Chest PE W/Cm &/Or Wo Cm  Result Date: 09/15/2022 CLINICAL DATA:  Known thoracic aneurysm EXAM: CT ANGIOGRAPHY CHEST WITH CONTRAST TECHNIQUE: Multidetector CT imaging of  the chest was performed using the standard protocol during bolus administration of intravenous contrast. Multiplanar CT image reconstructions and MIPs were obtained to evaluate the vascular anatomy. RADIATION DOSE REDUCTION: This exam was performed according to the departmental dose-optimization program which includes automated exposure control, adjustment of the mA and/or kV according to patient size and/or use of iterative reconstruction technique. CONTRAST:  75mL OMNIPAQUE IOHEXOL 350 MG/ML SOLN COMPARISON:  08/24/2022 FINDINGS: Cardiovascular: Satisfactory opacification of the pulmonary arteries to the segmental level. No evidence of pulmonary embolism. Enlarged heart with left ventricular thickening. There is atheromatous calcification that is extensive with prior CABG. A saphenous in LIMA grafts are enhancing proximally. Heavy calcification of the aortic valve and root of the aorta. Mild dilatation of the ascending aorta 4 cm. Mediastinum/Nodes: Negative for adenopathy or mass Lungs/Pleura: Low volume chest with mild ground-glass density that is likely atelectasis. Upper Abdomen: No acute finding Musculoskeletal: No acute finding Review of the MIP images confirms the above findings. IMPRESSION: 1. No acute finding. No evidence of acute aortic syndrome. Mild dilatation of the ascending aorta measuring up to 4 cm in diameter. There is heavy calcification of the aortic valve and aortic root. 2. Cardiomegaly with left ventricular hypertrophy. Atherosclerosis with prior CABG. Electronically Signed   By: Tiburcio Pea M.D.   On: 09/15/2022 04:45   CT Head Wo Contrast  Result Date: 09/15/2022 CLINICAL DATA:  Mental status change with unknown cause EXAM: CT HEAD WITHOUT CONTRAST TECHNIQUE: Contiguous axial images were obtained from the base of the skull through the vertex without intravenous contrast. RADIATION DOSE REDUCTION: This exam was performed according to the departmental dose-optimization program which  includes automated exposure control, adjustment of the mA and/or kV according to patient size and/or use of iterative reconstruction technique. 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 No evidence of acute infarct, hemorrhage, hydrocephalus, or collection. Vascular: No hyperdense vessel or unexpected calcification. Skull: Normal. Negative for fracture or focal lesion. Sinuses/Orbits: No acute finding. 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 IMPRESSION: 1. No acute finding. 2. 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 3. 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 Electronically Signed   By: Tiburcio Pea M.D.   On: 09/15/2022 04:15   DG Chest Port 1 View  Result Date: 09/15/2022 CLINICAL DATA:  Altered mental status EXAM: PORTABLE CHEST 1 VIEW COMPARISON:  09/07/2022 FINDINGS: Cardiac shadow is enlarged but stable. Postsurgical changes are seen. Aortic calcifications are noted. Inspiratory effort is poor although no focal infiltrate is seen. No acute bony abnormality is noted. IMPRESSION: No acute abnormality noted. Electronically Signed   By: Alcide Clever M.D.   On: 09/15/2022 03:54    ECG & Cardiac Imaging    Regular sinus rhythm, 77, right bundle branch block, LAD, LVH, prolonged QT- personally reviewed.  Assessment & Plan    1.  Coronary artery disease/precordial chest pain/chronically elevated troponin: Patient with a history of CAD status post CABG x 1 in the setting of AVR for aortic valve endocarditis in 2015.  He has had multiple admissions for chest pain since then with abnormal PET/CT in late 2021 with inferior ischemia and moderate RCA, LAD, and vein graft to OM for disease at that time and again in March 2024.  Admitted twice since his most recent  catheterization at [facility], with normal LV function by echo in early April.  He has chronically elevated troponins with trends in the 50s to 70s.  High suspicion for recurrent atrial fibrillation contributing to symptoms.  Presented back to the emergency department on May 21 with recurrent chest pain associated with palpitations and presyncope.  Troponins are again mildly elevated with a peak of 76 and otherwise flat trend.  ECG with right bundle branch block and otherwise no acute changes.  He has been in sinus rhythm on telemetry.  Currently chest pain-free.  Last dose of Xarelto was May 21.  With ongoing chest pain and troponin elevations, we will plan on relook right left heart cardiac catheterization with intravascular ultrasound to determine ischemic significance of previously ported moderate disease.  We will tentatively schedule him for tomorrow afternoon given last dose of Xarelto was yesterday.  Continue aspirin, statin, heparin, and nitrate therapy.  Blood pressure trends soft, which is limited escalation of GDMT in the past.  2.  Persistent atrial fibrillation: Reports compliance with amiodarone with last dose of Xarelto being taken yesterday.  Previously felt that recurrent A-fib and palpitations contributing to demand ischemia.  Sinus rhythm on presentation and on telemetry.  Known right bundle branch block with prolonged QT in that setting, though QT wider in the 580 range currently.  Amiodarone currently not ordered.  Will need to reconsider resumption of metoprolol therapy if blood pressure allows.  Xarelto currently on hold and heparin ordered.  3.  Essential hypertension: Blood pressure relatively soft.  Issues with hypotension on prior admissions resulting in discontinuation of prior home doses of chlorthalidone, HCTZ, losartan, metoprolol, and spironolactone.  4.  Hyperlipidemia: Continue statin therapy.  LDL was 34 in April.  5.  Type 2 diabetes mellitus: Insulin management per medicine  team.  6.  Stage III chronic kidney disease: Creatinine relatively stable at 1.26.  7.  Microcytic anemia: H&H lower than on prior admissions at 9.2/30.3.  Follow.  Risk Assessment/Risk Scores:     TIMI Risk Score for Unstable Angina or Non-ST Elevation MI:   The patient's TIMI risk score is 5, which indicates a 26% risk of all cause mortality, new or recurrent myocardial infarction or need for urgent revascularization in  the next 14 days.    CHA2DS2-VASc Score = 4   This indicates a 4.8% annual risk of stroke. The patient's score is based upon: CHF History: 1 HTN History: 1 Diabetes History: 1 Stroke History: 0 Vascular Disease History: 1 Age Score: 0 Gender Score: 0     Signed, Nicolasa Ducking, NP 10/08/2022, 11:12 AM  For questions or updates, please contact   Please consult [email or web address] for contact info under Cardiology/STEMI.

## 2022-10-08 NOTE — ED Notes (Signed)
Pt states he is having midsternal chest pain. Pt just finished eating his breakfast as well. MD Wieting at bedside.

## 2022-10-08 NOTE — ED Notes (Signed)
Portable EKG completed from hallway bed. Hard copy handed to provider. Unable to cross into chart.

## 2022-10-08 NOTE — Hospital Course (Addendum)
62 y.o. male with medical history significant of  coronary artery disease with single bypass grafting,LHC 07/2022 with stable CAD no culprit lesion , paroxysmal A-fib on Xarelto and amiodarone, chronic diastolic CHF, type 2 diabetes, hypothyroidism,osa, and right parotid mass followed by [facility] with plans of surgery,OSA,CKDIII who presented ED with complaint of central chest pain. Patient states he has had chest pain for six years and has pain frequently but today has persistent pain that lasted for hours at home that was stronger that his usual pain.  He notes no associated symptoms of sob/ n/v/diaphoresis or abdominal pain. However noted in the recent past he has has syncope associate with his pain. He notes that morphine given in ed relieves his pain but does not last.   5/22.  Patient having chest pain this morning.  Morphine seem to help but worse when he is walking around.  Case discussed with cardiology.. 5/23. Cardiac cath showed previously placed proximal LAD to mid LAD stent 20% restenosis, proximal circumflex lesion 85% stenosis, mid circumflex lesion 55% stenosis, proximal RCA lesion 45% stenosed, SVG-OM for you margin lesion 55% stenosed, hemodynamic findings consistent with mild pulmonary hypertension, left ventricular end-diastolic pressure is elevated, likely severe aortic stenosis on cardiac cath.  Cardiology recommended further evaluation at tertiary care center for possible TAVR. 5/24.  Accepted to [facility] by Dr. Debroah Baller.  Awaiting bed availability.  Patient still having chest pains.

## 2022-10-08 NOTE — Assessment & Plan Note (Addendum)
A1c elevated at 9.3.  Patient on Invokana.  Semglee insulin low-dose started here.Marland Kitchen

## 2022-10-08 NOTE — ED Notes (Signed)
Assumed care from Jeremy,RN. Pt resting comfortably in bed at this time. Pt denies any current needs or questions. Call light with in reach.   

## 2022-10-08 NOTE — Assessment & Plan Note (Signed)
CKD stage III A.  Last creatinine 1.26 with a GFR greater than 60.

## 2022-10-09 ENCOUNTER — Encounter
Admission: EM | Disposition: A | Discharge status: Other Acute Inpatient Hospital | Payer: Self-pay | Source: Home / Self Care | Attending: Internal Medicine

## 2022-10-09 ENCOUNTER — Other Ambulatory Visit: Payer: Self-pay

## 2022-10-09 DIAGNOSIS — N179 Acute kidney failure, unspecified: Secondary | ICD-10-CM

## 2022-10-09 DIAGNOSIS — D508 Other iron deficiency anemias: Secondary | ICD-10-CM

## 2022-10-09 DIAGNOSIS — D509 Iron deficiency anemia, unspecified: Secondary | ICD-10-CM

## 2022-10-09 DIAGNOSIS — Z794 Long term (current) use of insulin: Secondary | ICD-10-CM

## 2022-10-09 DIAGNOSIS — N189 Chronic kidney disease, unspecified: Secondary | ICD-10-CM

## 2022-10-09 DIAGNOSIS — K219 Gastro-esophageal reflux disease without esophagitis: Secondary | ICD-10-CM

## 2022-10-09 DIAGNOSIS — I25118 Atherosclerotic heart disease of native coronary artery with other forms of angina pectoris: Secondary | ICD-10-CM

## 2022-10-09 DIAGNOSIS — Z952 Presence of prosthetic heart valve: Secondary | ICD-10-CM

## 2022-10-09 DIAGNOSIS — Z951 Presence of aortocoronary bypass graft: Secondary | ICD-10-CM

## 2022-10-09 DIAGNOSIS — I2510 Atherosclerotic heart disease of native coronary artery without angina pectoris: Secondary | ICD-10-CM

## 2022-10-09 HISTORY — PX: RIGHT/LEFT HEART CATH AND CORONARY/GRAFT ANGIOGRAPHY: CATH118267

## 2022-10-09 LAB — ECHOCARDIOGRAM COMPLETE
AR max vel: 3.97 cm2
AV Area VTI: 3.85 cm2
AV Area mean vel: 3.8 cm2
AV Mean grad: 2 mmHg
AV Peak grad: 3.4 mmHg
Ao pk vel: 0.92 m/s
Area-P 1/2: 2.79 cm2
Calc EF: 68.1 %
Height: 70 in
S' Lateral: 3.5 cm
Single Plane A2C EF: 64.1 %
Single Plane A4C EF: 69.7 %
Weight: 4585.57 oz

## 2022-10-09 LAB — HEPARIN LEVEL (UNFRACTIONATED): Heparin Unfractionated: 0.54 IU/mL (ref 0.30–0.70)

## 2022-10-09 LAB — COMPREHENSIVE METABOLIC PANEL
ALT: 15 U/L (ref 0–44)
AST: 16 U/L (ref 15–41)
Albumin: 3.4 g/dL — ABNORMAL LOW (ref 3.5–5.0)
Alkaline Phosphatase: 88 U/L (ref 38–126)
Anion gap: 9 (ref 5–15)
BUN: 25 mg/dL — ABNORMAL HIGH (ref 6–20)
CO2: 28 mmol/L (ref 22–32)
Calcium: 8.4 mg/dL — ABNORMAL LOW (ref 8.9–10.3)
Chloride: 100 mmol/L (ref 98–111)
Creatinine, Ser: 1.18 mg/dL (ref 0.61–1.24)
GFR, Estimated: >60 mL/min (ref >60)
Glucose, Bld: 175 mg/dL — ABNORMAL HIGH (ref 70–99)
Potassium: 3.2 mmol/L — ABNORMAL LOW (ref 3.5–5.1)
Sodium: 137 mmol/L (ref 135–145)
Total Bilirubin: 0.7 mg/dL (ref 0.3–1.2)
Total Protein: 6.9 g/dL (ref 6.5–8.1)

## 2022-10-09 LAB — CBC
HCT: 27.6 % — ABNORMAL LOW (ref 39.0–52.0)
Hemoglobin: 8.3 g/dL — ABNORMAL LOW (ref 13.0–17.0)
MCH: 23.6 pg — ABNORMAL LOW (ref 26.0–34.0)
MCHC: 30.1 g/dL (ref 30.0–36.0)
MCV: 78.4 fL — ABNORMAL LOW (ref 80.0–100.0)
Platelets: 289 10*3/uL (ref 150–400)
RBC: 3.52 MIL/uL — ABNORMAL LOW (ref 4.22–5.81)
RDW: 15.3 % (ref 11.5–15.5)
WBC: 15.8 10*3/uL — ABNORMAL HIGH (ref 4.0–10.5)
nRBC: 0 % (ref 0.0–0.2)

## 2022-10-09 LAB — GLUCOSE, CAPILLARY
Glucose-Capillary: 159 mg/dL — ABNORMAL HIGH (ref 70–99)
Glucose-Capillary: 132 mg/dL — ABNORMAL HIGH (ref 70–99)
Glucose-Capillary: 131 mg/dL — ABNORMAL HIGH (ref 70–99)
Glucose-Capillary: 164 mg/dL — ABNORMAL HIGH (ref 70–99)

## 2022-10-09 LAB — APTT
aPTT: 97 seconds — ABNORMAL HIGH (ref 24–36)
aPTT: 84 seconds — ABNORMAL HIGH (ref 24–36)

## 2022-10-09 LAB — FERRITIN: Ferritin: 8 ng/mL — ABNORMAL LOW (ref 24–336)

## 2022-10-09 SURGERY — RIGHT/LEFT HEART CATH AND CORONARY/GRAFT ANGIOGRAPHY
Anesthesia: Moderate Sedation

## 2022-10-09 MED ORDER — ACETAMINOPHEN 325 MG PO TABS: 650.0000 mg | ORAL_TABLET | ORAL | Status: DC | PRN

## 2022-10-09 MED ORDER — SODIUM CHLORIDE 0.9 % IV SOLN: INTRAVENOUS | Status: DC

## 2022-10-09 MED ORDER — HYDRALAZINE HCL 20 MG/ML IJ SOLN: 10.0000 mg | INTRAMUSCULAR | Status: DC | PRN

## 2022-10-09 MED ORDER — SODIUM CHLORIDE 0.9% FLUSH: 3.0000 mL | INTRAVENOUS | Status: DC | PRN

## 2022-10-09 MED ORDER — HEPARIN SODIUM (PORCINE) 1000 UNIT/ML IJ SOLN
INTRAMUSCULAR | Status: DC | PRN
  Administered 2022-10-09: 5000 [IU] via INTRAVENOUS

## 2022-10-09 MED ORDER — LIDOCAINE HCL (PF) 1 % IJ SOLN
INTRAMUSCULAR | Status: DC | PRN
  Administered 2022-10-09: 20 mL
  Administered 2022-10-09: 10 mL

## 2022-10-09 MED ORDER — METOPROLOL TARTRATE 25 MG PO TABS
12.5000 mg | ORAL_TABLET | Freq: Two times a day (BID) | ORAL | Status: DC
  Administered 2022-10-09 – 2022-10-10 (×3): 12.5 mg via ORAL
  Filled 2022-10-09 (×3): qty 1

## 2022-10-09 MED ORDER — HEPARIN (PORCINE) IN NACL 1000-0.9 UT/500ML-% IV SOLN
INTRAVENOUS | Status: AC
  Filled 2022-10-09: qty 500

## 2022-10-09 MED ORDER — HEPARIN SODIUM (PORCINE) 1000 UNIT/ML IJ SOLN
INTRAMUSCULAR | Status: AC
  Filled 2022-10-09: qty 10

## 2022-10-09 MED ORDER — LABETALOL HCL 5 MG/ML IV SOLN: 10.0000 mg | INTRAVENOUS | Status: DC | PRN

## 2022-10-09 MED ORDER — MIDAZOLAM HCL 2 MG/2ML IJ SOLN
INTRAMUSCULAR | Status: AC
  Filled 2022-10-09: qty 2

## 2022-10-09 MED ORDER — HEPARIN (PORCINE) IN NACL 1000-0.9 UT/500ML-% IV SOLN
INTRAVENOUS | Status: DC | PRN
  Administered 2022-10-09 (×2): 500 mL

## 2022-10-09 MED ORDER — SODIUM CHLORIDE 0.9% FLUSH
3.0000 mL | Freq: Two times a day (BID) | INTRAVENOUS | Status: DC
  Administered 2022-10-10: 3 mL via INTRAVENOUS

## 2022-10-09 MED ORDER — FENTANYL CITRATE (PF) 100 MCG/2ML IJ SOLN
INTRAMUSCULAR | Status: DC | PRN
  Administered 2022-10-09 (×4): 25 ug via INTRAVENOUS

## 2022-10-09 MED ORDER — HEPARIN (PORCINE) IN NACL 1000-0.9 UT/500ML-% IV SOLN
INTRAVENOUS | Status: AC
  Filled 2022-10-09: qty 1000

## 2022-10-09 MED ORDER — SODIUM CHLORIDE 0.9 % IV SOLN: 250.0000 mL | INTRAVENOUS | Status: DC | PRN

## 2022-10-09 MED ORDER — IOHEXOL 300 MG/ML  SOLN
INTRAMUSCULAR | Status: DC | PRN
  Administered 2022-10-09: 150 mL

## 2022-10-09 MED ORDER — VERAPAMIL HCL 2.5 MG/ML IV SOLN
INTRAVENOUS | Status: AC
  Filled 2022-10-09: qty 2

## 2022-10-09 MED ORDER — POTASSIUM CHLORIDE CRYS ER 20 MEQ PO TBCR
40.0000 meq | EXTENDED_RELEASE_TABLET | Freq: Two times a day (BID) | ORAL | Status: DC
  Administered 2022-10-09 – 2022-10-10 (×3): 40 meq via ORAL
  Filled 2022-10-09 (×3): qty 2

## 2022-10-09 MED ORDER — FENTANYL CITRATE (PF) 100 MCG/2ML IJ SOLN
INTRAMUSCULAR | Status: AC
  Filled 2022-10-09: qty 2

## 2022-10-09 MED ORDER — VERAPAMIL HCL 2.5 MG/ML IV SOLN
INTRAVENOUS | Status: DC | PRN
  Administered 2022-10-09: 2.5 mg via INTRA_ARTERIAL

## 2022-10-09 MED ORDER — MIDAZOLAM HCL 2 MG/2ML IJ SOLN
INTRAMUSCULAR | Status: DC | PRN
  Administered 2022-10-09 (×2): 1 mg via INTRAVENOUS

## 2022-10-09 MED ORDER — LIDOCAINE HCL 1 % IJ SOLN
INTRAMUSCULAR | Status: AC
  Filled 2022-10-09: qty 20

## 2022-10-09 SURGICAL SUPPLY — 17 items
CANNULA 5F STIFF (CANNULA) IMPLANT
CATH 5FR JL3.5 JR4 ANG PIG MP (CATHETERS) IMPLANT
CATH AMP RT 5F (CATHETERS) IMPLANT
CATH BALLN WEDGE 5F 110CM (CATHETERS) IMPLANT
CATH INFINITI 5FR AL1 (CATHETERS) IMPLANT
DEVICE CLOSURE MYNXGRIP 5F (Vascular Products) IMPLANT
DEVICE RAD TR BAND REGULAR (VASCULAR PRODUCTS) IMPLANT
GLIDESHEATH SLEND SS 6F .021 (SHEATH) IMPLANT
PACK CARDIAC CATH (CUSTOM PROCEDURE TRAY) ×1 IMPLANT
PROTECTION STATION PRESSURIZED (MISCELLANEOUS) ×1
SET ATX-X65L (MISCELLANEOUS) IMPLANT
SHEATH AVANTI 5FR X 11CM (SHEATH) IMPLANT
SHEATH GLIDE SLENDER 4/5FR (SHEATH) IMPLANT
STATION PROTECTION PRESSURIZED (MISCELLANEOUS) IMPLANT
WIRE EMERALD 3MM-J .035X150CM (WIRE) IMPLANT
WIRE EMERALD ST .035X150CM (WIRE) IMPLANT
WIRE ROSEN-J .035X260CM (WIRE) IMPLANT

## 2022-10-09 NOTE — Progress Notes (Signed)
   10/09/22 2100  Spiritual Encounters  Type of Visit Initial  Care provided to: Family  Reason for visit Routine spiritual support  OnCall Visit Yes   Chaplain obtained permission from charge nurse and escorted family to have a short visit with patient after hours. Chaplain showed compassionate presence. Patient is scheduled for transfer to [facility] tomorrow. No further follow up required at this time.

## 2022-10-09 NOTE — Progress Notes (Signed)
Progress Note   Patient: Craig Reese ZOX:[id number] DOB: 22-Apr-1961 DOA: 10/07/2022     1 DOS: the patient was seen and examined on 10/09/2022   Brief hospital course: 62 y.o. male with medical history significant of  coronary artery disease with single bypass grafting,LHC 07/2022 with stable CAD no culprit lesion , paroxysmal A-fib on Xarelto and amiodarone, chronic diastolic CHF, type 2 diabetes, hypothyroidism,osa, and right parotid mass followed by [facility] with plans of surgery,OSA,CKDIII who presented ED with complaint of central chest pain. Patient states he has had chest pain for six years and has pain frequently but today has persistent pain that lasted for hours at home that was stronger that his usual pain.  He notes no associated symptoms of sob/ n/v/diaphoresis or abdominal pain. However noted in the recent past he has has syncope associate with his pain. He notes that morphine given in ed relieves his pain but does not last.   5/22.  Patient having chest pain this morning.  Morphine seem to help but worse when he is walking around.  Case discussed with cardiology.. 5/23. Cardiac cath today.  Still having chest pain with ambulation.  Assessment and Plan: * Chest pain With history of coronary artery disease.  There is a reproducible component.  Cardiology placed him on heparin drip and will due cardiac cath today.  Added diclofenac gel.  Iron deficiency anemia Hemoglobin down to 8.3 and ferritin 8.  Continue to watch for signs of bleeding  Paroxysmal atrial fibrillation (HCC) Holding Xarelto.  Continue amiodarone.  Currently in sinus rhythm. On heparin drip.  Hypokalemia Replace potassium  Uncontrolled type 2 diabetes mellitus with hyperglycemia, without long-term current use of insulin (HCC) A1c elevated at 9.3.  Patient on Invokana.  On Semglee insulin.  HLD (hyperlipidemia) Continue Lipitor  GERD (gastroesophageal reflux disease) On ppi and  carafate  Thoracic aortic aneurysm (HCC) Measuring 4.1 cm.  Acute kidney injury superimposed on CKD (HCC) Aki on ckd3a.  Cr 1.5 on admission and down to 1.18 with fluids  Chronic diastolic CHF (congestive heart failure) (HCC) Does not seem to be any signs of heart failure currently.        Subjective: Patient with chest pain with ambulation which is different than the reproducible chest pain.  Pain worse over last few days.  Physical Exam: Vitals:   10/08/22 2301 10/09/22 0345 10/09/22 0807 10/09/22 1132  BP: 116/66 (!) 102/59 111/72 114/63  Pulse: 94 77 77 72  Resp: 16 16 16 18   Temp: 99.5 F (37.5 C) 98.2 F (36.8 C) 98.3 F (36.8 C) 98.2 F (36.8 C)  TempSrc:      SpO2: 95% 95% 95% 95%  Weight:      Height:       Physical Exam HENT:     Head: Normocephalic.     Mouth/Throat:     Pharynx: No oropharyngeal exudate.  Eyes:     General: Lids are normal.     Conjunctiva/sclera: Conjunctivae normal.  Cardiovascular:     Rate and Rhythm: Normal rate and regular rhythm.     Heart sounds: Murmur heard.     Systolic murmur is present with a grade of 2/6.  Pulmonary:     Breath sounds: Examination of the right-lower field reveals decreased breath sounds. Examination of the left-lower field reveals decreased breath sounds. Decreased breath sounds present. No wheezing, rhonchi or rales.  Chest:     Comments: Pain to palpation over sternum. Abdominal:  Palpations: Abdomen is soft.     Tenderness: There is no abdominal tenderness.  Musculoskeletal:     Right lower leg: Swelling present.     Left lower leg: Swelling present.  Skin:    General: Skin is warm.     Findings: No rash.  Neurological:     Mental Status: He is alert and oriented to person, place, and time.     Data Reviewed: Hb 8.3, cr1.18   Disposition: Status is: Inpatient Remains inpatient appropriate because: cardiac cath today, if negative will have to see what we can do with medications to  help with pain  Planned Discharge Destination: Home    Time spent: 28 minutes Case discussed with cardiology Author: Alford Highland, MD 10/09/2022 1:27 PM  For on call review [email or web address].

## 2022-10-09 NOTE — Assessment & Plan Note (Signed)
On ppi and carafate

## 2022-10-09 NOTE — Consult Note (Signed)
ANTICOAGULATION CONSULT NOTE  Pharmacy Consult for Heparin Infusion Indication: chest pain/ACS  Patient Measurements: Height: 5\' 10"  (177.8 cm) Weight: 122.3 kg (269 lb 10 oz) IBW/kg (Calculated) : 73 Heparin Dosing Weight: 100.6 kg  Labs: Recent Labs    10/07/22 1450 10/07/22 1722 10/08/22 0811 10/08/22 1117 10/08/22 1421 10/08/22 1808 10/09/22 0104  HGB 9.2*  --   --   --   --   --   --   HCT 30.3*  --   --   --   --   --   --   PLT 288  --   --   --   --   --   --   APTT  --   --   --  29  --  55* 97*  LABPROT  --   --   --  15.1  --   --   --   INR  --   --   --  1.2  --   --   --   HEPARINUNFRC  --   --   --  0.24*  --  0.43  --   CREATININE 1.50*  --  1.26*  --   --   --   --   TROPONINIHS 63*   < > 63* 58* 72*  --   --    < > = values in this interval not displayed.    Estimated Creatinine Clearance: 82.8 mL/min (A) (by C-G formula based on SCr of 1.26 mg/dL (H)).  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   Chronic chest pain    Chronic heart failure with preserved ejection fraction (HFpEF) (HCC)    a. 08/2022 Echo: Ef 55-60%, no rwma, GrII DD, nl RV fxn, sev dil LA, mod dil RA, mild MR, mod AS (mean grad 29.23mmHg, AVA 1.24cm^2).   CKD (chronic kidney disease), stage III (HCC)    Coronary artery disease    a. 2015 s/p CABG x 1 (VG->OM); b. 04/2020 PET CT: apical, apical inf, mid inf, and basal inf ischemia. EF 41%; c. 05/2020 Cath: Sev OM4 dzs, patent graft, mod diff dzs; d. 07/2022 Cath [facility]): LM nl, LAD 40-50, LCX nl, OM4 80, RCA 50-60p, VG->OM 50p->Med rx.   Diabetes mellitus type II, non insulin dependent (HCC) 11/17/2018   Elevated troponin (chronic)    Essential hypertension    Hyperlipidemia LDL goal <70    Hypothyroidism 11/17/2018   Normocytic anemia    OSA (obstructive sleep apnea)    a. not on CPAP.   Persistent atrial fibrillation (HCC)    a. CHA2DS2VASc =  4-->xarelto/amio.   RBBB    Thoracic aortic aneurysm (HCC)    a. 08/2022 CT - 4.1cm.    Medications:  Scheduled:   amiodarone  200 mg Oral Daily   aspirin  81 mg Oral Pre-Cath   aspirin  81 mg Oral Daily   atorvastatin  80 mg Oral QPM   canagliflozin  300 mg Oral QAC breakfast   diclofenac Sodium  2 g Topical QID   famotidine  20 mg Oral BID   gabapentin  300 mg Oral TID   insulin aspart  0-9 Units Subcutaneous TID WC   insulin glargine-yfgn  10 Units Subcutaneous QHS   isosorbide mononitrate  30 mg Oral Daily   levothyroxine  300 mcg Oral QAC breakfast   pantoprazole  20 mg Oral BID  sodium chloride flush  3 mL Intravenous Q12H   sucralfate  1 g Oral TID WC & HS   Infusions:   sodium chloride     sodium chloride     heparin 1,600 Units/hr (10/09/22 0037)   PRN: sodium chloride, acetaminophen, oxyCODONE, sodium chloride flush  Assessment: Craig Reese is a 62 y.o. male presenting with chest pain and concerns for ACS. PMH significant for CAD (s/p CABG x1), CHF, AF, T2DM, hypothyroidism, CKD3. Patient was prescribed rivaroxaban PTA. No dispenses listed on dispense report, and there is some concern for nonadherence due to financial issues. Patient received SQ heparin 5000 units x1 on 10/08/2022 @ 0641 which may explain the slight baseline elevation in HL. Pharmacy has been consulted to initiate and manage heparin infusion.   Baseline Labs: aPTT 29, HL 0.24, PT 15.1, INR 1.3, Hgb 9.2, Hct 30.3, Plt 288   Goal of Therapy:  Heparin level 0.3-0.7 units/ml aPTT 66 - 102 seconds Monitor platelets by anticoagulation protocol: Yes   Date Time aPTT/HL Rate/Comment  5/22 1808 55/0.43 1400/aPTT subtherapeutic 5/23     0104      97                 1400/aPTT therapeutic X 1   Plan:  5/23 @ 0104:  aPTT = 97, therapeutic X 1 - will continue pt on current rate and recheck HL and aPTT on 5/23 @ 0700.  Check HL daily for correlation until HL and aPTT correlate. Switch to HL  monitoring once HL and aPTT correlate.  Continue to monitor H&H and platelets daily while on heparin infusion   Denyla Cortese D 10/09/2022 1:30 AM

## 2022-10-09 NOTE — Consult Note (Signed)
ANTICOAGULATION CONSULT NOTE  Pharmacy Consult for Heparin Infusion Indication: chest pain/ACS  Patient Measurements: Height: 5\' 10"  (177.8 cm) Weight: 122.3 kg (269 lb 10 oz) IBW/kg (Calculated) : 73 Heparin Dosing Weight: 100.6 kg  Labs: Recent Labs    10/07/22 1450 10/07/22 1722 10/08/22 0811 10/08/22 1117 10/08/22 1117 10/08/22 1421 10/08/22 1808 10/09/22 0104 10/09/22 0416 10/09/22 0742  HGB 9.2*  --   --   --   --   --   --   --  8.3*  --   HCT 30.3*  --   --   --   --   --   --   --  27.6*  --   PLT 288  --   --   --   --   --   --   --  289  --   APTT  --   --   --  29   < >  --  55* 97*  --  84*  LABPROT  --   --   --  15.1  --   --   --   --   --   --   INR  --   --   --  1.2  --   --   --   --   --   --   HEPARINUNFRC  --   --   --  0.24*  --   --  0.43  --   --  0.54  CREATININE 1.50*  --  1.26*  --   --   --   --   --  1.18  --   TROPONINIHS 63*   < > 63* 58*  --  72*  --   --   --   --    < > = values in this interval not displayed.    Estimated Creatinine Clearance: 88.4 mL/min (by C-G formula based on SCr of 1.18 mg/dL).  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   Chronic chest pain    Chronic heart failure with preserved ejection fraction (HFpEF) (HCC)    a. 08/2022 Echo: Ef 55-60%, no rwma, GrII DD, nl RV fxn, sev dil LA, mod dil RA, mild MR, mod AS (mean grad 29.30mmHg, AVA 1.24cm^2).   CKD (chronic kidney disease), stage III (HCC)    Coronary artery disease    a. 2015 s/p CABG x 1 (VG->OM); b. 04/2020 PET CT: apical, apical inf, mid inf, and basal inf ischemia. EF 41%; c. 05/2020 Cath: Sev OM4 dzs, patent graft, mod diff dzs; d. 07/2022 Cath [facility]): LM nl, LAD 40-50, LCX nl, OM4 80, RCA 50-60p, VG->OM 50p->Med rx.   Diabetes mellitus type II, non insulin dependent (HCC) 11/17/2018   Elevated troponin (chronic)    Essential hypertension    Hyperlipidemia LDL goal <70     Hypothyroidism 11/17/2018   Normocytic anemia    OSA (obstructive sleep apnea)    a. not on CPAP.   Persistent atrial fibrillation (HCC)    a. CHA2DS2VASc = 4-->xarelto/amio.   RBBB    Thoracic aortic aneurysm (HCC)    a. 08/2022 CT - 4.1cm.    Medications:  Scheduled:   amiodarone  200 mg Oral Daily   aspirin  81 mg Oral Daily   atorvastatin  80 mg Oral QPM   canagliflozin  300 mg Oral QAC breakfast   diclofenac Sodium  2 g Topical QID   famotidine  20 mg Oral BID   gabapentin  300 mg Oral TID   insulin aspart  0-9 Units Subcutaneous TID WC   insulin glargine-yfgn  10 Units Subcutaneous QHS   isosorbide mononitrate  30 mg Oral Daily   levothyroxine  300 mcg Oral QAC breakfast   metoprolol tartrate  12.5 mg Oral BID   pantoprazole  20 mg Oral BID   potassium chloride  40 mEq Oral BID   sodium chloride flush  3 mL Intravenous Q12H   sucralfate  1 g Oral TID WC & HS   Infusions:   sodium chloride     sodium chloride 75 mL/hr at 10/09/22 0557   heparin 1,600 Units/hr (10/09/22 0037)   PRN: sodium chloride, acetaminophen, oxyCODONE, sodium chloride flush  Assessment: Craig Reese is a 62 y.o. male presenting with chest pain and concerns for ACS. PMH significant for CAD (s/p CABG x1), CHF, AF, T2DM, hypothyroidism, CKD3. Patient was prescribed rivaroxaban PTA. No dispenses listed on dispense report, and there is some concern for nonadherence due to financial issues. Patient received SQ heparin 5000 units x1 on 10/08/2022 @ 0641 which may explain the slight baseline elevation in HL. Pharmacy has been consulted to initiate and manage heparin infusion.   Baseline Labs: aPTT 29, HL 0.24, PT 15.1, INR 1.3, Hgb 9.2, Hct 30.3, Plt 288   Goal of Therapy:  Heparin level 0.3-0.7 units/ml aPTT 66 - 102 seconds Monitor platelets by anticoagulation protocol: Yes   Date Time aPTT/HL Rate/Comment  5/22 1808 55/0.43 1400/aPTT subtherapeutic 5/23     0104      97                  1400/aPTT therapeutic X 1  5/23 0742 84/0.54 1400/aPTT and HL therapeutic x 2  Plan:  aPTT and HL correlating and therapeutic x 2, will continue current heparin rate at 1400 units/hr and repeat HL with AM labs Continue to monitor H&H and platelets daily while on heparin infusion   Craig Reese PharmD, BCPS 10/09/2022 8:59 AM

## 2022-10-09 NOTE — Interval H&P Note (Signed)
History and Physical Interval Note:  10/09/2022 4:40 PM  [facility]  has presented today for surgery, with the diagnosis of precordial chest pain.  The various methods of treatment have been discussed with the patient and family. After consideration of risks, benefits and other options for treatment, the patient has consented to  Procedure(s): RIGHT/LEFT HEART CATH AND CORONARY/GRAFT ANGIOGRAPHY (N/A)  PERCUTANEOUS CORONARY INTERVENTION   as a surgical intervention.  The patient's history has been reviewed, patient examined, no change in status, stable for surgery.  I have reviewed the patient's chart and labs.  Questions were answered to the patient's satisfaction.    Cath Lab Visit (complete for each Cath Lab visit)  Clinical Evaluation Leading to the Procedure:   ACS: Yes.    Non-ACS:    Anginal Classification: CCS III  Anti-ischemic medical therapy: Maximal Therapy (2 or more classes of medications)  Non-Invasive Test Results: No non-invasive testing performed  Prior CABG: Previous CABG    Bryan Lemma

## 2022-10-09 NOTE — Assessment & Plan Note (Addendum)
Hemoglobin 8.5 and ferritin 8.  Continue to watch for signs of bleeding.  300 mg IV Venofer given today.

## 2022-10-09 NOTE — Progress Notes (Signed)
Cardiology Progress Note   Patient Name: Craig Reese Date of Encounter: 10/09/2022  Primary Cardiologist: [facility]  Subjective   Intermittent chest pain since admission.  Feels tired this AM - didn't sleep well.  NPO for cath today.  No afib on tele.  Inpatient Medications    Scheduled Meds:  amiodarone  200 mg Oral Daily   aspirin  81 mg Oral Daily   atorvastatin  80 mg Oral QPM   canagliflozin  300 mg Oral QAC breakfast   diclofenac Sodium  2 g Topical QID   famotidine  20 mg Oral BID   gabapentin  300 mg Oral TID   insulin aspart  0-9 Units Subcutaneous TID WC   insulin glargine-yfgn  10 Units Subcutaneous QHS   isosorbide mononitrate  30 mg Oral Daily   levothyroxine  300 mcg Oral QAC breakfast   pantoprazole  20 mg Oral BID   sodium chloride flush  3 mL Intravenous Q12H   sucralfate  1 g Oral TID WC & HS   Continuous Infusions:  sodium chloride     sodium chloride 75 mL/hr at 10/09/22 0557   heparin 1,600 Units/hr (10/09/22 0037)   PRN Meds: sodium chloride, acetaminophen, oxyCODONE, sodium chloride flush   Vital Signs    Vitals:   10/08/22 1634 10/08/22 2010 10/08/22 2301 10/09/22 0345  BP: 121/67 119/70 116/66 (!) 102/59  Pulse: 84 95 94 77  Resp: 18 18 16 16   Temp: 99.9 F (37.7 C) 98.5 F (36.9 C) 99.5 F (37.5 C) 98.2 F (36.8 C)  TempSrc:      SpO2: 96% 97% 95% 95%  Weight:      Height:        Intake/Output Summary (Last 24 hours) at 10/09/2022 0755 Last data filed at 10/09/2022 0600 Gross per 24 hour  Intake 866.59 ml  Output 900 ml  Net -33.41 ml   Filed Weights   10/07/22 1448  Weight: 122.3 kg    Physical Exam   GEN: Well nourished, well developed, in no acute distress.  HEENT: Grossly normal.  Neck: Supple, no JVD, carotid bruits, or masses. Cardiac: RRR, 2/6 syst murmur, no rubs or gallops. No clubbing, cyanosis, edema.  Radials 2+, DP/PT 2+ and equal bilaterally.  Respiratory:  Respirations regular and unlabored,  clear to auscultation bilaterally. GI: Soft, nontender, nondistended, BS + x 4. MS: no deformity or atrophy. Skin: warm and dry, no rash. Neuro:  Strength and sensation are intact. Psych: AAOx3.  Normal affect.  Labs    Chemistry Recent Labs  Lab 10/07/22 1450 10/08/22 0811 10/09/22 0416  NA 136 139 137  K 2.9* 3.5 3.2*  CL 95* 100 100  CO2 29 29 28   GLUCOSE 235* 235* 175*  BUN 39* 32* 25*  CREATININE 1.50* 1.26* 1.18  CALCIUM 9.2 8.8* 8.4*  PROT  --  7.4 6.9  ALBUMIN  --  3.6 3.4*  AST  --  24 16  ALT  --  17 15  ALKPHOS  --  104 88  BILITOT  --  0.7 0.7  GFRNONAA 53* >60 >60  ANIONGAP 12 10 9      Hematology Recent Labs  Lab 10/07/22 1450 10/09/22 0416  WBC 11.1* 15.8*  RBC 3.85* 3.52*  HGB 9.2* 8.3*  HCT 30.3* 27.6*  MCV 78.7* 78.4*  MCH 23.9* 23.6*  MCHC 30.4 30.1  RDW 14.8 15.3  PLT 288 289    Cardiac Enzymes  Recent Labs  Lab 10/07/22 1722  10/07/22 2246 10/08/22 0811 10/08/22 1117 10/08/22 1421  TROPONINIHS 73* 76* 63* 58* 72*      BNP    Component Value Date/Time   BNP 300.1 (H) 10/07/2022 2246   Lipids  Lab Results  Component Value Date   CHOL 96 09/07/2022   HDL 24 (L) 09/07/2022   LDLCALC 34 09/07/2022   TRIG 189 (H) 09/07/2022   CHOLHDL 4.0 09/07/2022    HbA1c  Lab Results  Component Value Date   HGBA1C 9.3 (H) 08/24/2022    Radiology    CT CHEST WO CONTRAST  Result Date: 10/08/2022 CLINICAL DATA:  Pneumonia. Complication suspected. X-ray done. Previous CABG. History of congestive heart failure. Chest pain. EXAM: CT CHEST WITHOUT CONTRAST TECHNIQUE: Multidetector CT imaging of the chest was performed following the standard protocol without IV contrast. RADIATION DOSE REDUCTION: This exam was performed according to the departmental dose-optimization program which includes automated exposure control, adjustment of the mA and/or kV according to patient size and/or use of iterative reconstruction technique. COMPARISON:  Chest  radiography yesterday. Previous chest CT 09/15/2022 FINDINGS: Cardiovascular: Previous median sternotomy and CABG. Extensive coronary artery calcification and aortic atherosclerotic calcification. Extensive calcification of the aortic valve region. Maximal diameter of the ascending aorta is stable at 4.1 cm as seen previously. Mediastinum/Nodes: No mass or lymphadenopathy. No evidence of postsurgical complication. Lungs/Pleura: No pleural effusion. The lungs are clear. No infiltrate or collapse. Minimal right middle lobe scar. Minimal biapical scarring. Upper Abdomen: Normal Musculoskeletal: No acute or significant bone finding. IMPRESSION: 1. No acute chest finding. No evidence of pneumonia. 2. Previous median sternotomy and CABG. Extensive coronary artery calcification and aortic atherosclerotic calcification. Extensive calcification of the aortic valve region. Maximal diameter of the ascending aorta is stable at 4.1 cm. No change in previous follow-up recommendations. Recommend annual imaging followup by CTA or MRA. This recommendation follows 2010 ACCF/AHA/AATS/ACR/ASA/SCA/SCAI/SIR/STS/SVM Guidelines for the Diagnosis and Management of Patients with Thoracic Aortic Disease. Circulation. 2010; 121: Z610-R604. Aortic aneurysm NOS (ICD10-I71.9) Aortic Atherosclerosis (ICD10-I70.0). Electronically Signed   By: Paulina Fusi M.D.   On: 10/08/2022 08:06   DG Chest 2 View  Result Date: 10/07/2022 CLINICAL DATA:  cp EXAM: CHEST - 2 VIEW COMPARISON:  09/15/2022 FINDINGS: Low lung volumes. Mild perihilar interstitial edema or infiltrates are suspected. Heart size and mediastinal contours are within normal limits. Aortic Atherosclerosis (ICD10-170.0). No effusion. Sternotomy wires and CABG markers. IMPRESSION: Low volumes with mild perihilar interstitial edema or infiltrates. Electronically Signed   By: Corlis Leak M.D.   On: 10/07/2022 15:13    Telemetry    RSR - Personally Reviewed  Cardiac Studies   2D  Echocardiogram 4.2024   1. Left ventricular ejection fraction, by estimation, is 55 to 60%. The  left ventricle has normal function. The left ventricle has no regional  wall motion abnormalities. There is severe left ventricular hypertrophy.  Left ventricular diastolic parameters   are consistent with Grade II diastolic dysfunction (pseudonormalization).   2. Right ventricular systolic function is mildly reduced. The right  ventricular size is normal. There is normal pulmonary artery systolic  pressure.   3. Left atrial size was severely dilated.   4. Right atrial size was moderately dilated.   5. The mitral valve is normal in structure. Mild mitral valve  regurgitation. No evidence of mitral stenosis.   6. The aortic valve has been repaired/replaced. Aortic valve  regurgitation is not visualized. Moderate aortic valve stenosis. Aortic  valve area, by VTI measures 1.24 cm. Aortic  valve mean gradient measures  29.5 mmHg.   7. Aortic dilatation noted. There is mild dilatation of the ascending  aorta, measuring 40 mm.   8. The inferior vena cava is normal in size with <50% respiratory  variability, suggesting right atrial pressure of 8 mmHg.  _____________   Cardiac Catheterization  3.14.2024 [facility])  Left Main:  The left main coronary artery (LMCA) is a large-caliber vessel  that originates from the left coronary sinus. It bifurcates into the left  anterior descending (LAD) and left circumflex (LCx) arteries. There is no  angiographic evidence of significant disease in the LMCA.   LAD: The LAD is a large-caliber vessel that gives off four diagonal (D)  branches before it wraps around the apex. D1 is a moderate-caliber vessel.  D2 is a moderate-caliber vessel. D3 is a small-caliber vessel. D4 is a  moderate-caliber vessel. There is diffuse moderate disease measuring up to  40-50%.   Left Circumflex: The LCx is a large-caliber vessel that gives off four  obtuse marginal (OM) branches  and then continues as a small vessel in the  AV groove. OM1 is a small-caliber vessel. OM2 is a small-caliber vessel.  OM3 is a small-caliber vessel. OM4 is a moderate-caliber vessel. There is  80% proximal stenosis.   Right Coronary: The right coronary artery (RCA) is a large-caliber vessel  originating from the right coronary sinus. It bifurcates distally into a  moderate-caliber posterior descending artery (PDA) and three  posterolateral (PL) branches consistent with a right dominant system.  There is 50-60% proximal stenosis.   Bypass Graft Angiography:  SVG to OM: 50% stenosis involving proximal segment of graft  _____________   Patient Profile     Craig Reese is a 62 y.o. 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  Assessment & Plan    1.  CAD/precordial chest pain/chronically elevated troponin:  Patient with a history of CAD status post CABG x 1 in the setting of AVR for aortic valve endocarditis in 2015. He has had multiple admissions for chest pain since then with abnormal PET/CT in late 2021 with inferior ischemia and moderate RCA, LAD, and vein graft to OM for disease at that time and again in March 2024. Admitted twice since his most recent catheterization at [facility], with normal LV function by echo in early April. He has chronically elevated troponins with trends in the 50s to 70s. Previously high suspicion for recurrent atrial fibrillation contributing to symptoms, though symptoms have been intermittent for at least 6 years.  Readmitted 5/21 w/ chest pain.  HsTrops 73  76  63   58  72.  Ongoing intermittent c/p since admission.  No afib on tele.  Plan for cath today.  Discussed w/ pt via interpreter.  The patient understands that risks include but are not limited to stroke (1 in 1000), death (1 in 1000), kidney failure [usually temporary] (1 in 500), bleeding (1 in 200), allergic reaction [possibly serious] (1 in 200), and agrees to proceed.  Cont asa, statin, heparin, nitrate.  Will add low-dose ? blocker.  2.  Persistent Afib:  No afib since presentation, but recurrent c/p, thus unclear to what extent c/p  symptoms can be explained by arrhythmia.  Known prolongation of QT in setting of RBBB.  Cont oral amio 200 mg daily.  Currently on heparin.  Last dose of xarelto on 5/21.  3.  Essential HTN:  stable.  Adding low dose ? blocker.  4.  HL:  LDL 34 in April.  Cont statin.  5.  DMII:  per IM.  6.  Chest wall pain:  has a chest wall wound that is tender to touch and causes c/p, but this is different than angina/presenting symptoms.  7.  CKD III:  stable.   8.  Hypokalemia:  K 3.2.  Supplementation ordered.  9.  Microcytic anemia:  H/H drifting down slightly.  8.3/27.6 this AM.  Follow w/ low threshold to transfuse for further drops.  Signed, Nicolasa Ducking, NP  10/09/2022, 7:55 AM    For questions or updates, please contact   Please consult [email or web address] for contact info under Cardiology/STEMI.

## 2022-10-09 NOTE — Assessment & Plan Note (Addendum)
Aki on ckd3a.  Cr 1.5 on admission and down to 1.18 with fluids.  Creatinine 1.23 today.

## 2022-10-10 ENCOUNTER — Other Ambulatory Visit: Payer: Self-pay

## 2022-10-10 ENCOUNTER — Encounter: Payer: Self-pay | Admitting: Cardiology

## 2022-10-10 LAB — BASIC METABOLIC PANEL
Anion gap: 8 (ref 5–15)
BUN: 22 mg/dL — ABNORMAL HIGH (ref 6–20)
CO2: 26 mmol/L (ref 22–32)
Calcium: 8.1 mg/dL — ABNORMAL LOW (ref 8.9–10.3)
Chloride: 104 mmol/L (ref 98–111)
Creatinine, Ser: 1.23 mg/dL (ref 0.61–1.24)
GFR, Estimated: >60 mL/min (ref >60)
Glucose, Bld: 177 mg/dL — ABNORMAL HIGH (ref 70–99)
Potassium: 3.8 mmol/L (ref 3.5–5.1)
Sodium: 138 mmol/L (ref 135–145)

## 2022-10-10 LAB — CBC
HCT: 27.9 % — ABNORMAL LOW (ref 39.0–52.0)
Hemoglobin: 8.5 g/dL — ABNORMAL LOW (ref 13.0–17.0)
MCH: 24.1 pg — ABNORMAL LOW (ref 26.0–34.0)
MCHC: 30.5 g/dL (ref 30.0–36.0)
MCV: 79.3 fL — ABNORMAL LOW (ref 80.0–100.0)
Platelets: 295 10*3/uL (ref 150–400)
RBC: 3.52 MIL/uL — ABNORMAL LOW (ref 4.22–5.81)
RDW: 15.2 % (ref 11.5–15.5)
WBC: 11.8 10*3/uL — ABNORMAL HIGH (ref 4.0–10.5)
nRBC: 0 % (ref 0.0–0.2)

## 2022-10-10 LAB — GLUCOSE, CAPILLARY
Glucose-Capillary: 137 mg/dL — ABNORMAL HIGH (ref 70–99)
Glucose-Capillary: 136 mg/dL — ABNORMAL HIGH (ref 70–99)

## 2022-10-10 MED ORDER — HEPARIN (PORCINE) 25000 UT/250ML-% IV SOLN: 1600.0000 [IU]/h | INTRAVENOUS | Status: DC

## 2022-10-10 MED ORDER — ASPIRIN 81 MG PO CHEW: 81.0000 mg | CHEWABLE_TABLET | Freq: Every day | ORAL | 0 refills | Status: DC

## 2022-10-10 MED ORDER — SODIUM CHLORIDE 0.9 % IV SOLN
300.0000 mg | Freq: Once | INTRAVENOUS | Status: AC
  Administered 2022-10-10: 300 mg via INTRAVENOUS
  Filled 2022-10-10: qty 300

## 2022-10-10 MED ORDER — OXYCODONE HCL 5 MG PO TABS: 5.0000 mg | ORAL_TABLET | ORAL | 0 refills | Status: DC | PRN

## 2022-10-10 MED ORDER — POTASSIUM CHLORIDE CRYS ER 20 MEQ PO TBCR: 20.0000 meq | EXTENDED_RELEASE_TABLET | Freq: Every day | ORAL | Status: DC

## 2022-10-10 MED ORDER — HEPARIN BOLUS VIA INFUSION
4000.0000 [IU] | Freq: Once | INTRAVENOUS | Status: AC
  Administered 2022-10-10: 4000 [IU] via INTRAVENOUS
  Filled 2022-10-10: qty 4000

## 2022-10-10 MED ORDER — METOPROLOL TARTRATE 25 MG PO TABS: 12.5000 mg | ORAL_TABLET | Freq: Two times a day (BID) | ORAL | 0 refills | Status: DC

## 2022-10-10 MED ORDER — POTASSIUM CHLORIDE CRYS ER 20 MEQ PO TBCR: 20.0000 meq | EXTENDED_RELEASE_TABLET | Freq: Every day | ORAL | 0 refills | Status: AC

## 2022-10-10 MED ORDER — HEPARIN (PORCINE) 25000 UT/250ML-% IV SOLN
1600.0000 [IU]/h | INTRAVENOUS | Status: DC
  Administered 2022-10-10: 1600 [IU]/h via INTRAVENOUS
  Filled 2022-10-10: qty 250

## 2022-10-10 NOTE — Progress Notes (Signed)
Rounding Note    Patient Name: Craig Reese Date of Encounter: 10/10/2022  [facility] Cardiologist: Lucienne Minks: Massie Maroon  Subjective   No complaints this morning Cardiac catheterization results reviewed with him, Severe stenosis by mean and peak pressures Peak pressure 90 mmHg, mean pressure estimated 80 mmHg Very difficult to cross valve per Dr. Herbie Baltimore On crossing the valve pressure 225 systolic, down to 130 on pullback Bioprosthetic valve in aortic position  Echocardiogram from April 24 reviewed with select images detailing mean gradient 40 mmHg across aortic valve  Inpatient Medications    Scheduled Meds:  amiodarone  200 mg Oral Daily   aspirin  81 mg Oral Daily   atorvastatin  80 mg Oral QPM   canagliflozin  300 mg Oral QAC breakfast   diclofenac Sodium  2 g Topical QID   famotidine  20 mg Oral BID   gabapentin  300 mg Oral TID   insulin aspart  0-9 Units Subcutaneous TID WC   insulin glargine-yfgn  10 Units Subcutaneous QHS   isosorbide mononitrate  30 mg Oral Daily   levothyroxine  300 mcg Oral QAC breakfast   metoprolol tartrate  12.5 mg Oral BID   pantoprazole  20 mg Oral BID   potassium chloride  40 mEq Oral BID   sodium chloride flush  3 mL Intravenous Q12H   sodium chloride flush  3 mL Intravenous Q12H   sucralfate  1 g Oral TID WC & HS   Continuous Infusions:  sodium chloride     iron sucrose 300 mg (10/10/22 0953)   PRN Meds: sodium chloride, acetaminophen, oxyCODONE, sodium chloride flush   Vital Signs    Vitals:   10/09/22 2112 10/09/22 2317 10/10/22 0330 10/10/22 0750  BP: 113/69 111/78 114/78 106/75  Pulse: 87 83 78 74  Resp: (!) 21 19 17 16   Temp: 99 F (37.2 C) 99.5 F (37.5 C) 97.8 F (36.6 C) 99 F (37.2 C)  TempSrc:  Oral  Oral  SpO2: 98% 90% 93% 96%  Weight:      Height:        Intake/Output Summary (Last 24 hours) at 10/10/2022 1044 Last data filed at 10/10/2022 0600 Gross per 24 hour  Intake 850 ml   Output 2325 ml  Net -1475 ml      10/07/2022    2:48 PM 09/06/2022   11:47 PM 08/26/2022    8:29 AM  Last 3 Weights  Weight (lbs) 269 lb 10 oz 269 lb 9.6 oz 269 lb 9.6 oz  Weight (kg) 122.3 kg 122.29 kg 122.29 kg      Telemetry    Normal sinus rhythm- Personally Reviewed  ECG     - Personally Reviewed  Physical Exam   GEN: No acute distress.   Neck: No JVD Cardiac: RRR, 3/8 systolic ejection murmur right sternal border radiating to the left Respiratory: Clear to auscultation bilaterally. GI: Soft, nontender, non-distended  MS: No edema; No deformity. Neuro:  Nonfocal  Psych: Normal affect   Labs    High Sensitivity Troponin:   Recent Labs  Lab 10/07/22 1722 10/07/22 2246 10/08/22 0811 10/08/22 1117 10/08/22 1421  TROPONINIHS 73* 76* 63* 58* 72*     Chemistry Recent Labs  Lab 10/08/22 0811 10/09/22 0416 10/10/22 0435  NA 139 137 138  K 3.5 3.2* 3.8  CL 100 100 104  CO2 29 28 26   GLUCOSE 235* 175* 177*  BUN 32* 25* 22*  CREATININE 1.26* 1.18 1.23  CALCIUM 8.8* 8.4* 8.1*  MG 2.3  --   --   PROT 7.4 6.9  --   ALBUMIN 3.6 3.4*  --   AST 24 16  --   ALT 17 15  --   ALKPHOS 104 88  --   BILITOT 0.7 0.7  --   GFRNONAA >60 >60 >60  ANIONGAP 10 9 8     Lipids No results for input(s): "CHOL", "TRIG", "HDL", "LABVLDL", "LDLCALC", "CHOLHDL" in the last 168 hours.  Hematology Recent Labs  Lab 10/07/22 1450 10/09/22 0416 10/10/22 0435  WBC 11.1* 15.8* 11.8*  RBC 3.85* 3.52* 3.52*  HGB 9.2* 8.3* 8.5*  HCT 30.3* 27.6* 27.9*  MCV 78.7* 78.4* 79.3*  MCH 23.9* 23.6* 24.1*  MCHC 30.4 30.1 30.5  RDW 14.8 15.3 15.2  PLT 288 289 295   Thyroid No results for input(s): "TSH", "FREET4" in the last 168 hours.  BNP Recent Labs  Lab 10/07/22 2246  BNP 300.1*    DDimer No results for input(s): "DDIMER" in the last 168 hours.   Radiology    CARDIAC CATHETERIZATION  Result Date: 10/09/2022   HEMODYNAMIC FINDINGS CONSISTENT WITH SEVERE AORTIC STENOSIS -  P-P 94.3 mmHg, mean 81.6 mmHg   The previously placed prox LAD to Mid LAD stent is 20% restenosed..   Prox Cx lesion is 85% stenosed.  Mid Cx lesion is 55% stenosed.   Prox RCA lesion is 45% stenosed.   SVG-OM4u was injected, it was large in size.: Origin lesion is 55% stenosed.   LV end diastolic pressure is severely elevated.   Hemodynamic findings consistent with mild pulmonary hypertension Likely severe stenosis of the proximal aortic valve with severely elevated mean and peak to peak pressures (81.6 mmHg and 94.3 mmHg) Severely elevated LVEDP with mildly elevated PCWP-large V wave suggesting further disease. Per report, stable findings with moderate 45% proximal RCA, 80% proximal to mid LCx followed by 60% mid LCx lesion, and maybe 10 to 50% ISR are in the LAD; 50% ostial SVG-OM4 (upper limb) RECOMMENDATIONS Return to nursing unit for ongoing care with gentle hydration given severely elevated LVEDP Anticipate referral back to [facility] for evaluation of valve in valve TAVR Would likely require some diuresis based on elevated LVEDP Bryan Lemma, MD   Cardiac Studies   Cardiac catheterization performed Oct 09, 2022   HEMODYNAMIC FINDINGS CONSISTENT WITH SEVERE AORTIC STENOSIS - P-P 94.3 mmHg, mean 81.6 mmHg   The previously placed prox LAD to Mid LAD stent is 20% restenosed..   Prox Cx lesion is 85% stenosed.  Mid Cx lesion is 55% stenosed.   Prox RCA lesion is 45% stenosed.   SVG-OM4u was injected, it was large in size.: Origin lesion is 55% stenosed.   LV end diastolic pressure is severely elevated.   Hemodynamic findings consistent with mild pulmonary hypertension     Likely severe stenosis of the proximal aortic valve with severely elevated mean and peak to peak pressures (81.6 mmHg and 94.3 mmHg) Severely elevated LVEDP with mildly elevated PCWP-large V wave suggesting further disease.  Per report, stable findings with moderate 45% proximal RCA, 80% proximal to mid LCx followed by 60%  mid LCx lesion, and maybe 10 to 50% ISR are in the LAD; 50% ostial SVG-OM4 (upper limb)      RECOMMENDATIONS Return to nursing unit for ongoing care with gentle hydration given severely elevated LVEDP Anticipate referral back to [facility] for evaluation of valve in valve TAVR Would likely require some diuresis based  on elevated LVEDP  Patient Profile     Craig Reese is a 62 y.o. 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   Assessment & Plan    Coronary artery disease with stable angina Chronic chest pain in the setting of CABG x 1, multiple admissions for similar symptoms Cardiac catheterization yesterday with stable disease Noted to have severe aortic valve stenosis, mean gradient 80 mmHg on crossing the valve, peak gradient 90 mmHg Results discussed with him, he has indicated he would like to be transferred to [facility], to his general cardiologist for further discussion concerning his aortic valve disease and need for intervention -We will hold off on transesophageal echo, this can be performed at [facility] by structural heart team.   Persistent atrial fibrillation Maintaining normal sinus rhythm On amiodarone 200 daily, heparin in place of Xarelto   Essential hypertension Blood pressure is well controlled on today's visit. No changes made to the medications.  Hyperlipidemia LDL at goal 34, continue statin   Chronic kidney disease stage III Creatinine at his baseline  CR 1.23     Total encounter time more than 50 minutes  Greater than 50% was spent in counseling and  coordination of care with the patient    For questions or updates, please contact [facility] Please consult [email or web address] for contact info under        Signed, Julien Nordmann, MD  10/10/2022, 10:44 AM

## 2022-10-10 NOTE — Progress Notes (Signed)
Called [facility] at [employer], report was given to Ms Toni Amend.  Still working on transportation.

## 2022-10-10 NOTE — Discharge Summary (Addendum)
Physician Discharge Summary   Patient: Craig Reese MRN: [id number] DOB: 12/27/1960  Admit date:     10/07/2022  Discharge date: 10/10/22  Discharge Physician: Alford Highland   PCP: Katrina Stack, MD   Recommendations at discharge:    Follow up with team at [facility]  Discharge Diagnoses: Principal Problem:   Nonspecific chest pain Active Problems:   Severe aortic valve stenosis   Paroxysmal atrial fibrillation (HCC)   Iron deficiency anemia   Uncontrolled type 2 diabetes mellitus with hyperglycemia, without long-term current use of insulin (HCC)   Hypokalemia   HLD (hyperlipidemia)   Chronic diastolic CHF (congestive heart failure) (HCC)   Acute kidney injury superimposed on CKD (HCC)   Thoracic aortic aneurysm (HCC)   GERD (gastroesophageal reflux disease)   History of aortic valve replacement    Hospital Course: 62 y.o. male with medical history significant of  coronary artery disease with single bypass grafting,LHC 07/2022 with stable CAD no culprit lesion , paroxysmal A-fib on Xarelto and amiodarone, chronic diastolic CHF, type 2 diabetes, hypothyroidism,osa, and right parotid mass followed [facility] with plans of surgery,OSA,CKDIII who presented ED with complaint of central chest pain. Patient states he has had chest pain for six years and has pain frequently but today has persistent pain that lasted for hours at home that was stronger that his usual pain.  He notes no associated symptoms of sob/ n/v/diaphoresis or abdominal pain. However noted in the recent past he has has syncope associate with his pain. He notes that morphine given in ed relieves his pain but does not last.   5/22.  Patient having chest pain this morning.  Morphine seem to help but worse when he is walking around.  Case discussed with cardiology.. 5/23. Cardiac cath showed previously placed proximal LAD to mid LAD stent 20% restenosis, proximal circumflex lesion 85% stenosis, mid circumflex  lesion 55% stenosis, proximal RCA lesion 45% stenosed, SVG-OM for you margin lesion 55% stenosed, hemodynamic findings consistent with mild pulmonary hypertension, left ventricular end-diastolic pressure is elevated, likely severe aortic stenosis on cardiac cath.  Cardiology recommended further evaluation at tertiary care center for possible TAVR. 5/24.  Accepted to [facility] by Dr. Debroah Baller.  Awaiting bed availability.  Patient still having chest pains.  Assessment and Plan: * Nonspecific chest pain With severe aortic stenosis on cardiac cath.  Patient with history of coronary artery disease.  There is a reproducible component also to his chest pain.  Cardiology recommended transfer to [facility] for evaluation of heart valve.  Patient accepted by Dr. Debroah Baller.  Awaiting bed availability.  Severe aortic valve stenosis Patient will be transferred to [facility] when bed available.  Iron deficiency anemia Hemoglobin 8.5 and ferritin 8.  Continue to watch for signs of bleeding.  300 mg IV Venofer given today.  Paroxysmal atrial fibrillation (HCC) Holding Xarelto.  Continue amiodarone.  Currently in sinus rhythm. Restart heparin drip.  Hypokalemia On low-dose replacement.  Uncontrolled type 2 diabetes mellitus with hyperglycemia, without long-term current use of insulin (HCC) A1c elevated at 9.3.  Patient on Invokana.  Semglee insulin low-dose started here.Marland Kitchen  HLD (hyperlipidemia) Continue Lipitor  GERD (gastroesophageal reflux disease) On ppi and carafate  Thoracic aortic aneurysm (HCC) Measuring 4.1 cm.  Acute kidney injury superimposed on CKD (HCC) Aki on ckd3a.  Cr 1.5 on admission and down to 1.18 with fluids.  Creatinine 1.23 today.  Chronic diastolic CHF (congestive heart failure) (HCC) Patient seems euvolemic currently.  Consultants: Cardiology Procedures performed: cardiac cath Disposition: [facility] when bed available Diet recommendation:  Cardiac and Carb modified  diet DISCHARGE MEDICATION: Allergies as of 10/10/2022   No Known Allergies      Medication List     STOP taking these medications    lidocaine 5 % ointment Commonly known as: XYLOCAINE   rivaroxaban 20 MG Tabs tablet Commonly known as: XARELTO       TAKE these medications    amiodarone 200 MG tablet Commonly known as: Pacerone Take 1 tablet (200 mg total) by mouth daily.   aspirin 81 MG chewable tablet Chew 1 tablet (81 mg total) by mouth daily. Start taking on: Oct 11, 2022   atorvastatin 80 MG tablet Commonly known as: LIPITOR Take 80 mg by mouth every evening.   diclofenac Sodium 1 % Gel Commonly known as: VOLTAREN Apply 2 g topically 4 (four) times daily as needed.   furosemide 40 MG tablet Commonly known as: LASIX Take 1 tablet (40 mg total) by mouth daily as needed. for up to 3 days for increased leg swelling, shortness of breath, weight gain 5+ lbs over 1-2 days. Seek medical care if these symptoms are not improving with increased dose.   gabapentin 300 MG capsule Commonly known as: NEURONTIN Take 300 mg by mouth 2 (two) times daily.   heparin 44010 UT/250ML infusion Inject 1,600 Units/hr into the vein continuous.   Invokana 300 MG Tabs tablet Generic drug: canagliflozin Take 300 mg by mouth daily before breakfast.   isosorbide mononitrate 30 MG 24 hr tablet Commonly known as: IMDUR Take 1 tablet (30 mg total) by mouth daily.   levothyroxine 300 MCG tablet Commonly known as: SYNTHROID Take 300 mcg by mouth daily before breakfast.   metoprolol tartrate 25 MG tablet Commonly known as: LOPRESSOR Take 0.5 tablets (12.5 mg total) by mouth 2 (two) times daily.   oxyCODONE 5 MG immediate release tablet Commonly known as: Oxy IR/ROXICODONE Take 1 tablet (5 mg total) by mouth every 4 (four) hours as needed for moderate pain.   pantoprazole 20 MG tablet Commonly known as: PROTONIX Take 1 tablet (20 mg total) by mouth 2 (two) times daily.    potassium chloride SA 20 MEQ tablet Commonly known as: KLOR-CON M Take 1 tablet (20 mEq total) by mouth daily. Start taking on: Oct 11, 2022   sucralfate 1 GM/10ML suspension Commonly known as: CARAFATE Take 1 g by mouth 4 (four) times daily -  with meals and at bedtime.        Discharge Exam: Filed Weights   10/07/22 1448  Weight: 122.3 kg   Physical Exam HENT:     Head: Normocephalic.     Mouth/Throat:     Pharynx: No oropharyngeal exudate.  Eyes:     General: Lids are normal.     Conjunctiva/sclera: Conjunctivae normal.  Cardiovascular:     Rate and Rhythm: Normal rate and regular rhythm.     Heart sounds: Murmur heard.     Systolic murmur is present with a grade of 4/6.  Pulmonary:     Breath sounds: Examination of the right-lower field reveals decreased breath sounds. Examination of the left-lower field reveals decreased breath sounds. Decreased breath sounds present. No wheezing, rhonchi or rales.  Chest:     Comments: Pain to palpation over sternum. Abdominal:     Palpations: Abdomen is soft.     Tenderness: There is no abdominal tenderness.  Musculoskeletal:     Right lower  leg: Swelling present.     Left lower leg: Swelling present.  Skin:    General: Skin is warm.     Findings: No rash.  Neurological:     Mental Status: He is alert and oriented to person, place, and time.      Condition at discharge: stable  The results of significant diagnostics from this hospitalization (including imaging, microbiology, ancillary and laboratory) are listed below for reference.   Imaging Studies: CARDIAC CATHETERIZATION  Result Date: 10/09/2022   HEMODYNAMIC FINDINGS CONSISTENT WITH SEVERE AORTIC STENOSIS - P-P 94.3 mmHg, mean 81.6 mmHg   The previously placed prox LAD to Mid LAD stent is 20% restenosed..   Prox Cx lesion is 85% stenosed.  Mid Cx lesion is 55% stenosed.   Prox RCA lesion is 45% stenosed.   SVG-OM4u was injected, it was large in size.: Origin lesion  is 55% stenosed.   LV end diastolic pressure is severely elevated.   Hemodynamic findings consistent with mild pulmonary hypertension Likely severe stenosis of the proximal aortic valve with severely elevated mean and peak to peak pressures (81.6 mmHg and 94.3 mmHg) Severely elevated LVEDP with mildly elevated PCWP-large V wave suggesting further disease. Per report, stable findings with moderate 45% proximal RCA, 80% proximal to mid LCx followed by 60% mid LCx lesion, and maybe 10 to 50% ISR are in the LAD; 50% ostial SVG-OM4 (upper limb) RECOMMENDATIONS Return to nursing unit for ongoing care with gentle hydration given severely elevated LVEDP Anticipate referral back to [facility] for evaluation of valve in valve TAVR Would likely require some diuresis based on elevated LVEDP Bryan Lemma, MD  CT CHEST WO CONTRAST  Result Date: 10/08/2022 CLINICAL DATA:  Pneumonia. Complication suspected. X-ray done. Previous CABG. History of congestive heart failure. Chest pain. EXAM: CT CHEST WITHOUT CONTRAST TECHNIQUE: Multidetector CT imaging of the chest was performed following the standard protocol without IV contrast. RADIATION DOSE REDUCTION: This exam was performed according to the departmental dose-optimization program which includes automated exposure control, adjustment of the mA and/or kV according to patient size and/or use of iterative reconstruction technique. COMPARISON:  Chest radiography yesterday. Previous chest CT 09/15/2022 FINDINGS: Cardiovascular: Previous median sternotomy and CABG. Extensive coronary artery calcification and aortic atherosclerotic calcification. Extensive calcification of the aortic valve region. Maximal diameter of the ascending aorta is stable at 4.1 cm as seen previously. Mediastinum/Nodes: No mass or lymphadenopathy. No evidence of postsurgical complication. Lungs/Pleura: No pleural effusion. The lungs are clear. No infiltrate or collapse. Minimal right middle lobe scar.  Minimal biapical scarring. Upper Abdomen: Normal Musculoskeletal: No acute or significant bone finding. IMPRESSION: 1. No acute chest finding. No evidence of pneumonia. 2. Previous median sternotomy and CABG. Extensive coronary artery calcification and aortic atherosclerotic calcification. Extensive calcification of the aortic valve region. Maximal diameter of the ascending aorta is stable at 4.1 cm. No change in previous follow-up recommendations. Recommend annual imaging followup by CTA or MRA. This recommendation follows 2010 ACCF/AHA/AATS/ACR/ASA/SCA/SCAI/SIR/STS/SVM Guidelines for the Diagnosis and Management of Patients with Thoracic Aortic Disease. Circulation. 2010; 121: J478-G956. Aortic aneurysm NOS (ICD10-I71.9) Aortic Atherosclerosis (ICD10-I70.0). Electronically Signed   By: Paulina Fusi M.D.   On: 10/08/2022 08:06   DG Chest 2 View  Result Date: 10/07/2022 CLINICAL DATA:  cp EXAM: CHEST - 2 VIEW COMPARISON:  09/15/2022 FINDINGS: Low lung volumes. Mild perihilar interstitial edema or infiltrates are suspected. Heart size and mediastinal contours are within normal limits. Aortic Atherosclerosis (ICD10-170.0). No effusion. Sternotomy wires and CABG markers. IMPRESSION:  Low volumes with mild perihilar interstitial edema or infiltrates. Electronically Signed   By: Corlis Leak M.D.   On: 10/07/2022 15:13   CT Angio Chest PE W/Cm &/Or Wo Cm  Result Date: 09/15/2022 CLINICAL DATA:  Known thoracic aneurysm EXAM: CT ANGIOGRAPHY CHEST WITH CONTRAST TECHNIQUE: Multidetector CT imaging of the chest was performed using the standard protocol during bolus administration of intravenous contrast. Multiplanar CT image reconstructions and MIPs were obtained to evaluate the vascular anatomy. RADIATION DOSE REDUCTION: This exam was performed according to the departmental dose-optimization program which includes automated exposure control, adjustment of the mA and/or kV according to patient size and/or use of  iterative reconstruction technique. CONTRAST:  75mL OMNIPAQUE IOHEXOL 350 MG/ML SOLN COMPARISON:  08/24/2022 FINDINGS: Cardiovascular: Satisfactory opacification of the pulmonary arteries to the segmental level. No evidence of pulmonary embolism. Enlarged heart with left ventricular thickening. There is atheromatous calcification that is extensive with prior CABG. A saphenous in LIMA grafts are enhancing proximally. Heavy calcification of the aortic valve and root of the aorta. Mild dilatation of the ascending aorta 4 cm. Mediastinum/Nodes: Negative for adenopathy or mass Lungs/Pleura: Low volume chest with mild ground-glass density that is likely atelectasis. Upper Abdomen: No acute finding Musculoskeletal: No acute finding Review of the MIP images confirms the above findings. IMPRESSION: 1. No acute finding. No evidence of acute aortic syndrome. Mild dilatation of the ascending aorta measuring up to 4 cm in diameter. There is heavy calcification of the aortic valve and aortic root. 2. Cardiomegaly with left ventricular hypertrophy. Atherosclerosis with prior CABG. Electronically Signed   By: Tiburcio Pea M.D.   On: 09/15/2022 04:45   CT Head Wo Contrast  Result Date: 09/15/2022 CLINICAL DATA:  Mental status change with unknown cause EXAM: CT HEAD WITHOUT CONTRAST TECHNIQUE: Contiguous axial images were obtained from the base of the skull through the vertex without intravenous contrast. RADIATION DOSE REDUCTION: This exam was performed according to the departmental dose-optimization program which includes automated exposure control, adjustment of the mA and/or kV according to patient size and/or use of iterative reconstruction technique. 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 No evidence of acute infarct, hemorrhage, hydrocephalus, or collection. Vascular: No hyperdense vessel or  unexpected calcification. Skull: Normal. Negative for fracture or focal lesion. Sinuses/Orbits: No acute finding. 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 IMPRESSION: 1. No acute finding. 2. 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 3. 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 Electronically Signed   By: Tiburcio Pea M.D.   On: 09/15/2022 04:15   DG Chest Port 1 View  Result Date: 09/15/2022 CLINICAL DATA:  Altered mental status EXAM: PORTABLE CHEST 1 VIEW COMPARISON:  09/07/2022 FINDINGS: Cardiac shadow is enlarged but stable. Postsurgical changes are seen. Aortic calcifications are noted. Inspiratory effort is poor although no focal infiltrate is seen. No acute bony abnormality is noted. IMPRESSION: No acute abnormality noted. Electronically Signed   By: Alcide Clever M.D.   On: 09/15/2022 03:54    Microbiology: Results for orders placed or performed during the hospital encounter of 09/15/22  SARS Coronavirus 2 by RT PCR (hospital order, performed in [facility] hospital lab) *cepheid single result test* Anterior Nasal Swab     Status: None   Collection Time: 09/15/22  3:36 AM   Specimen: Anterior Nasal  Swab  Result Value Ref Range Status   SARS Coronavirus 2 by RT PCR NEGATIVE NEGATIVE Final    Comment: (NOTE) SARS-CoV-2 target nucleic acids are NOT DETECTED.  The SARS-CoV-2 RNA is generally detectable in upper and lower respiratory specimens during the acute phase of infection. The lowest concentration of SARS-CoV-2 viral copies this assay can detect is 250 copies / mL. A negative result does not preclude SARS-CoV-2 infection and should not be used as the sole basis for treatment or other patient management decisions.  A negative result may occur with improper specimen collection / handling, submission of specimen other than nasopharyngeal swab, presence of viral mutation(s)  within the areas targeted by this assay, and inadequate number of viral copies (<250 copies / mL). A negative result must be combined with clinical observations, patient history, and epidemiological information.  Fact Sheet for Patients:   RoadLapTop.co.za  Fact Sheet for Healthcare Providers: http://kim-miller.com/  This test is not yet approved or  cleared by the [place] FDA and has been authorized for detection and/or diagnosis of SARS-CoV-2 by FDA under an Emergency Use Authorization (EUA).  This EUA will remain in effect (meaning this test can be used) for the duration of the COVID-19 declaration under Section 564(b)(1) of the Act, 21 U.S.C. section 360bbb-3(b)(1), unless the authorization is terminated or revoked sooner.  Performed at [facility], [address] [place], [place] [address]   Culture, blood (routine x 2)     Status: None   Collection Time: 09/15/22  5:40 AM   Specimen: BLOOD  Result Value Ref Range Status   Specimen Description BLOOD RIGHT ARM  Final   Special Requests   Final    BOTTLES DRAWN AEROBIC AND ANAEROBIC Blood Culture adequate volume   Culture   Final    NO GROWTH 5 DAYS Performed at [facility], [address] [place], [place] [address]    Report Status 09/20/2022 FINAL  Final  Culture, blood (routine x 2)     Status: None   Collection Time: 09/15/22  5:40 AM   Specimen: BLOOD  Result Value Ref Range Status   Specimen Description BLOOD LEFT HAND  Final   Special Requests   Final    BOTTLES DRAWN AEROBIC AND ANAEROBIC Blood Culture adequate volume   Culture   Final    NO GROWTH 5 DAYS Performed at [facility], [address] [place], [place] [address]    Report Status 09/20/2022 FINAL  Final    Labs: CBC: Recent Labs  Lab 10/07/22 1450 10/09/22 0416 10/10/22 0435  WBC 11.1* 15.8* 11.8*  HGB 9.2* 8.3* 8.5*  HCT 30.3* 27.6* 27.9*  MCV 78.7*  78.4* 79.3*  PLT 288 289 295   Basic Metabolic Panel: Recent Labs  Lab 10/07/22 1450 10/08/22 0811 10/09/22 0416 10/10/22 0435  NA 136 139 137 138  K 2.9* 3.5 3.2* 3.8  CL 95* 100 100 104  CO2 29 29 28 26   GLUCOSE 235* 235* 175* 177*  BUN 39* 32* 25* 22*  CREATININE 1.50* 1.26* 1.18 1.23  CALCIUM 9.2 8.8* 8.4* 8.1*  MG  --  2.3  --   --    Liver Function Tests: Recent Labs  Lab 10/08/22 0811 10/09/22 0416  AST 24 16  ALT 17 15  ALKPHOS 104 88  BILITOT 0.7 0.7  PROT 7.4 6.9  ALBUMIN 3.6 3.4*   CBG: Recent Labs  Lab 10/09/22 1132 10/09/22 1517 10/09/22 2116 10/10/22 0749 10/10/22 1158  GLUCAP 132* 131* 164* 137* 136*    Discharge time spent: greater than 30 minutes.  Signed: Alford Highland, MD [facility] 10/10/2022

## 2022-10-10 NOTE — TOC CM/SW Note (Signed)
TOC following for needs. Pharmacy was following for medication assistance at discharge but he has orders to transfer to [facility] when a bed is available.  Charlynn Court, CSW [phone]

## 2022-10-10 NOTE — Progress Notes (Signed)
Progress Note   Patient: Craig Reese ZOX:[id number] DOB: 02/06/1961 DOA: 10/07/2022     2 DOS: the patient was seen and examined on 10/10/2022   Brief hospital course: 62 y.o. male with medical history significant of  coronary artery disease with single bypass grafting,LHC 07/2022 with stable CAD no culprit lesion , paroxysmal A-fib on Xarelto and amiodarone, chronic diastolic CHF, type 2 diabetes, hypothyroidism,osa, and right parotid mass [facility] with plans of surgery,OSA,CKDIII who presented ED with complaint of central chest pain. Patient states he has had chest pain for six years and has pain frequently but today has persistent pain that lasted for hours at home that was stronger that his usual pain.  He notes no associated symptoms of sob/ n/v/diaphoresis or abdominal pain. However noted in the recent past he has has syncope associate with his pain. He notes that morphine given in ed relieves his pain but does not last.   5/22.  Patient having chest pain this morning.  Morphine seem to help but worse when he is walking around.  Case discussed with cardiology.. 5/23. Cardiac cath showed previously placed proximal LAD to mid LAD stent 20% restenosis, proximal circumflex lesion 85% stenosis, mid circumflex lesion 55% stenosis, proximal RCA lesion 45% stenosed, SVG-OM for you margin lesion 55% stenosed, hemodynamic findings consistent with mild pulmonary hypertension, left ventricular end-diastolic pressure is elevated, likely severe aortic stenosis on cardiac cath.  Cardiology recommended further evaluation at tertiary care center for possible TAVR. 5/24.  Accepted to [facility] by Dr. Debroah Baller.  Awaiting bed availability.  Patient still having chest pains.  Assessment and Plan: * Nonspecific chest pain With severe aortic stenosis on cardiac cath.  Patient with history of coronary artery disease.  There is a reproducible component also to his chest pain.  Cardiology recommended  transfer to [facility] for evaluation of heart valve.  Patient accepted by Dr. Debroah Baller.  Awaiting bed availability.  Severe aortic valve stenosis Patient will be transferred to [facility] when bed available.  Iron deficiency anemia Hemoglobin 8.5 and ferritin 8.  Continue to watch for signs of bleeding.  300 mg IV Venofer given today.  Paroxysmal atrial fibrillation (HCC) Holding Xarelto.  Continue amiodarone.  Currently in sinus rhythm. Restart heparin drip.  Hypokalemia On low-dose replacement.  Uncontrolled type 2 diabetes mellitus with hyperglycemia, without long-term current use of insulin (HCC) A1c elevated at 9.3.  Patient on Invokana.  Semglee insulin low-dose started here.Marland Kitchen  HLD (hyperlipidemia) Continue Lipitor  GERD (gastroesophageal reflux disease) On ppi and carafate  Thoracic aortic aneurysm (HCC) Measuring 4.1 cm.  Acute kidney injury superimposed on CKD (HCC) Aki on ckd3a.  Cr 1.5 on admission and down to 1.18 with fluids.  Creatinine 1.23 today.  Chronic diastolic CHF (congestive heart failure) (HCC) Patient seems euvolemic currently.        Subjective: Patient still having chest pain this morning.  Agreeable to IV iron this morning with iron deficiency anemia.  No signs of bleeding.  Patient agreeable to transfer to [facility].  Physical Exam: Vitals:   10/09/22 2317 10/10/22 0330 10/10/22 0750 10/10/22 1159  BP: 111/78 114/78 106/75 124/79  Pulse: 83 78 74 70  Resp: 19 17 16 16   Temp: 99.5 F (37.5 C) 97.8 F (36.6 C) 99 F (37.2 C) 98.2 F (36.8 C)  TempSrc: Oral  Oral   SpO2: 90% 93% 96% 97%  Weight:      Height:       Physical Exam HENT:  Head: Normocephalic.     Mouth/Throat:     Pharynx: No oropharyngeal exudate.  Eyes:     General: Lids are normal.     Conjunctiva/sclera: Conjunctivae normal.  Cardiovascular:     Rate and Rhythm: Normal rate and regular rhythm.     Heart sounds: Murmur heard.     Systolic murmur is present with a grade of 4/6.   Pulmonary:     Breath sounds: Examination of the right-lower field reveals decreased breath sounds. Examination of the left-lower field reveals decreased breath sounds. Decreased breath sounds present. No wheezing, rhonchi or rales.  Chest:     Comments: Pain to palpation over sternum. Abdominal:     Palpations: Abdomen is soft.     Tenderness: There is no abdominal tenderness.  Musculoskeletal:     Right lower leg: Swelling present.     Left lower leg: Swelling present.  Skin:    General: Skin is warm.     Findings: No rash.  Neurological:     Mental Status: He is alert and oriented to person, place, and time.     Data Reviewed: Creatinine 1.23, potassium 3.8, white blood cell count 11.8, hemoglobin 8.5, platelet count 295  Disposition: Status is: Inpatient Remains inpatient appropriate because: Setting up transfer to [facility] for evaluation of TAVR  Planned Discharge Destination: [facility]    Time spent: 32 minutes Case discussed with cardiology.  Author: Alford Highland, MD 10/10/2022 12:15 PM  For on call review [email or web address].

## 2022-10-10 NOTE — Assessment & Plan Note (Signed)
Patient will be transferred to [facility] when bed available.

## 2022-10-10 NOTE — Consult Note (Signed)
ANTICOAGULATION CONSULT NOTE  Pharmacy Consult for IV Heparin Indication: chest pain/ACS  Patient Measurements: Height: 5\' 10"  (177.8 cm) Weight: 122.3 kg (269 lb 10 oz) IBW/kg (Calculated) : 73 Heparin Dosing Weight: 100.6 kg  Labs: Recent Labs    10/07/22 1450 10/07/22 1722 10/08/22 0811 10/08/22 1117 10/08/22 1117 10/08/22 1421 10/08/22 1808 10/09/22 0104 10/09/22 0416 10/09/22 0742 10/10/22 0435  HGB 9.2*  --   --   --   --   --   --   --  8.3*  --  8.5*  HCT 30.3*  --   --   --   --   --   --   --  27.6*  --  27.9*  PLT 288  --   --   --   --   --   --   --  289  --  295  APTT  --   --   --  29   < >  --  55* 97*  --  84*  --   LABPROT  --   --   --  15.1  --   --   --   --   --   --   --   INR  --   --   --  1.2  --   --   --   --   --   --   --   HEPARINUNFRC  --   --   --  0.24*  --   --  0.43  --   --  0.54  --   CREATININE 1.50*  --  1.26*  --   --   --   --   --  1.18  --  1.23  TROPONINIHS 63*   < > 63* 58*  --  72*  --   --   --   --   --    < > = values in this interval not displayed.    Estimated Creatinine Clearance: 84.8 mL/min (by C-G formula based on SCr of 1.23 mg/dL).  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   Chronic chest pain    Chronic heart failure with preserved ejection fraction (HFpEF) (HCC)    a. 08/2022 Echo: Ef 55-60%, no rwma, GrII DD, nl RV fxn, sev dil LA, mod dil RA, mild MR, mod AS (mean grad 29.26mmHg, AVA 1.24cm^2).   CKD (chronic kidney disease), stage III (HCC)    Coronary artery disease    a. 2015 s/p CABG x 1 (VG->OM); b. 04/2020 PET CT: apical, apical inf, mid inf, and basal inf ischemia. EF 41%; c. 05/2020 Cath: Sev OM4 dzs, patent graft, mod diff dzs; d. 07/2022 Cath [facility]): LM nl, LAD 40-50, LCX nl, OM4 80, RCA 50-60p, VG->OM 50p->Med rx.   Diabetes mellitus type II, non insulin dependent (HCC) 11/17/2018   Elevated troponin (chronic)    Essential  hypertension    Hyperlipidemia LDL goal <70    Hypothyroidism 11/17/2018   Normocytic anemia    OSA (obstructive sleep apnea)    a. not on CPAP.   Persistent atrial fibrillation (HCC)    a. CHA2DS2VASc = 4-->xarelto/amio.   RBBB    Thoracic aortic aneurysm (HCC)    a. 08/2022 CT - 4.1cm.    Medications:  Rivaroxaban listed on prior to admission medication list. However, nothing on dispense report. Potential concern over non-compliance  due to financial issues  Assessment: 62 y/o M with medical history as above and including CAD s/p single bypass graft, LHC 07/2022 with stable CAD and no culprit lesion, Afib prescribed rivaroxaban, diastolic CHF, DM, hypothyroidism, right parotid mass following with [facility] ENT, CKD admitted with chest pain and concerns for ACS. Pharmacy consulted to initiate and manage heparin infusion for ACS.   Baseline aPTT 29s, INR 1.2, Heparin stopped yesterday after cath. Ordered to resume prior to transfer to [facility] for possible valve replacement.  Goal of Therapy:  Heparin level 0.3-0.7 units/ml aPTT 66 - 102 seconds Monitor platelets by anticoagulation protocol: Yes   Plan:  --Heparin 4000 unit IV bolus followed by continuous infusion at 1600 units/hr -- Will order next HL 6 hrs after initiating hepatin infusion --Daily CBC per protocol while on IV heparin  Emersyn Wyss Rodriguez-Guzman PharmD, BCPS 10/10/2022 12:24 PM

## 2022-10-11 LAB — LIPOPROTEIN A (LPA): Lipoprotein (a): 100 nmol/L — ABNORMAL HIGH (ref <75.0)

## 2022-10-14 LAB — POCT I-STAT EG7
Acid-Base Excess: 0 mmol/L (ref 0.0–2.0)
Bicarbonate: 24.6 mmol/L (ref 20.0–28.0)
Calcium, Ion: 1.02 mmol/L — ABNORMAL LOW (ref 1.15–1.40)
HCT: 25 % — ABNORMAL LOW (ref 39.0–52.0)
Hemoglobin: 8.5 g/dL — ABNORMAL LOW (ref 13.0–17.0)
O2 Saturation: 62 %
Potassium: 3 mmol/L — ABNORMAL LOW (ref 3.5–5.1)
Sodium: 136 mmol/L (ref 135–145)
TCO2: 26 mmol/L (ref 22–32)
pCO2, Ven: 40.6 mmHg — ABNORMAL LOW (ref 44–60)
pH, Ven: 7.391 (ref 7.25–7.43)
pO2, Ven: 32 mmHg (ref 32–45)

## 2022-10-14 LAB — POCT I-STAT 7, (LYTES, BLD GAS, ICA,H+H)
Acid-Base Excess: 2 mmol/L (ref 0.0–2.0)
Acid-Base Excess: 1 mmol/L (ref 0.0–2.0)
Bicarbonate: 26.2 mmol/L (ref 20.0–28.0)
Bicarbonate: 21.3 mmol/L (ref 20.0–28.0)
Calcium, Ion: 1.13 mmol/L — ABNORMAL LOW (ref 1.15–1.40)
Calcium, Ion: 1.04 mmol/L — ABNORMAL LOW (ref 1.15–1.40)
HCT: 27 % — ABNORMAL LOW (ref 39.0–52.0)
HCT: 26 % — ABNORMAL LOW (ref 39.0–52.0)
Hemoglobin: 9.2 g/dL — ABNORMAL LOW (ref 13.0–17.0)
Hemoglobin: 8.8 g/dL — ABNORMAL LOW (ref 13.0–17.0)
O2 Saturation: 93 %
O2 Saturation: 99 %
Potassium: 3.5 mmol/L (ref 3.5–5.1)
Potassium: 3.3 mmol/L — ABNORMAL LOW (ref 3.5–5.1)
Sodium: 142 mmol/L (ref 135–145)
Sodium: 133 mmol/L — ABNORMAL LOW (ref 135–145)
TCO2: 27 mmol/L (ref 22–32)
TCO2: 22 mmol/L (ref 22–32)
pCO2 arterial: 36.8 mmHg (ref 32–48)
pCO2 arterial: 20.2 mmHg — ABNORMAL LOW (ref 32–48)
pH, Arterial: 7.46 — ABNORMAL HIGH (ref 7.35–7.45)
pH, Arterial: 7.631 (ref 7.35–7.45)
pO2, Arterial: 62 mmHg — ABNORMAL LOW (ref 83–108)
pO2, Arterial: 117 mmHg — ABNORMAL HIGH (ref 83–108)

## 2022-12-15 DIAGNOSIS — E119 Type 2 diabetes mellitus without complications: Secondary | ICD-10-CM

## 2022-12-15 LAB — GLUCOSE, POCT (MANUAL RESULT ENTRY): POC Glucose: 104 mg/dl — AB (ref 70–99)

## 2022-12-15 NOTE — Congregational Nurse Program (Signed)
Dept: [phone]   Congregational Nurse Program Note  Date of Encounter: 12/15/2022  Client into initial visit at nurse only clinic at [facility] requesting glucose screening and BP check. Co. Re HIPAA. Primarily Spanish speaking therefore, summarized visit using [place], bilingual social services staff at food pantry to assure understanding. Hx of irregular heart beat, open heart surgery, pace maker placement and diabetes. Home glucometer recently broke and is awaiting replacement. Due to recent chest pain, has a nurse providing home visits 3 times a week. Was aware of watching sugar intake re: diabetes but daughter cooks so isn't sure about salt intake. (Daughter present so reviewed this with her while interpreter was present. She verbalizes understanding.) denies chest pain today. BP 160/80 (will follow up with nurse at [facility]), P 80. O2 97% and non fasting glucose 104. RTC as needed.  Rhermann, RN Past 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   Chronic chest pain    Chronic heart failure with preserved ejection fraction (HFpEF) (HCC)    a. 08/2022 Echo: Ef 55-60%, no rwma, GrII DD, nl RV fxn, sev dil LA, mod dil RA, mild MR, mod AS (mean grad 29.80mmHg, AVA 1.24cm^2).   CKD (chronic kidney disease), stage III (HCC)    Coronary artery disease    a. 2015 s/p CABG x 1 (VG->OM); b. 04/2020 PET CT: apical, apical inf, mid inf, and basal inf ischemia. EF 41%; c. 05/2020 Cath: Sev OM4 dzs, patent graft, mod diff dzs; d. 07/2022 Cath [facility]): LM nl, LAD 40-50, LCX nl, OM4 80, RCA 50-60p, VG->OM 50p->Med rx.   Diabetes mellitus type II, non insulin dependent (HCC) 11/17/2018   Elevated troponin (chronic)    Essential hypertension    Hyperlipidemia LDL goal <70    Hypothyroidism 11/17/2018   Normocytic anemia    OSA (obstructive sleep apnea)    a. not on CPAP.   Persistent atrial fibrillation (HCC)    a. CHA2DS2VASc =  4-->xarelto/amio.   RBBB    Thoracic aortic aneurysm (HCC)    a. 08/2022 CT - 4.1cm.    Encounter Details:  CNP Questionnaire - 12/15/22 1521       Questionnaire   Ask client: Do you give verbal consent for me to treat you today? Yes    Student Assistance N/A    Location Patient Information systems manager, [employer]    Visit Setting with Client Organization    Patient Status Unknown    Insurance Medicaid    Insurance/Financial Assistance Referral N/A    Medication N/A    Medical Provider Yes    Screening Referrals Made N/A    Medical Referrals Made N/A    Medical Appointment Made N/A    Recently w/o PCP, now 1st time PCP visit completed due to CNs referral or appointment made N/A    Food Have Food Insecurities    Transportation N/A    Housing/Utilities N/A    Interpersonal Safety N/A    Interventions Advocate/Support;Navigate Healthcare System;Educate    Abnormal to Normal Screening Since Last CN Visit N/A    Screenings CN Performed Blood Pressure;Blood Glucose;Pulse Ox    Sent Client to Lab for: N/A    Did client attend any of the following based off CNs referral or appointments made? N/A    ED Visit Averted N/A    Life-Saving Intervention Made N/A

## 2023-04-25 ENCOUNTER — Observation Stay
Admission: EM | Admit: 2023-04-25 | Discharge: 2023-04-28 | Disposition: A | Discharge status: Home / Self Care | Payer: Self-pay | Attending: Student in an Organized Health Care Education/Training Program | Admitting: Student in an Organized Health Care Education/Training Program

## 2023-04-25 ENCOUNTER — Other Ambulatory Visit: Payer: Self-pay

## 2023-04-25 ENCOUNTER — Emergency Department: Payer: Self-pay

## 2023-04-25 DIAGNOSIS — I130 Hypertensive heart and chronic kidney disease with heart failure and stage 1 through stage 4 chronic kidney disease, or unspecified chronic kidney disease: Secondary | ICD-10-CM | POA: Insufficient documentation

## 2023-04-25 DIAGNOSIS — E039 Hypothyroidism, unspecified: Secondary | ICD-10-CM | POA: Insufficient documentation

## 2023-04-25 DIAGNOSIS — D509 Iron deficiency anemia, unspecified: Secondary | ICD-10-CM | POA: Insufficient documentation

## 2023-04-25 DIAGNOSIS — R9431 Abnormal electrocardiogram [ECG] [EKG]: Secondary | ICD-10-CM | POA: Insufficient documentation

## 2023-04-25 DIAGNOSIS — Z1152 Encounter for screening for COVID-19: Secondary | ICD-10-CM | POA: Insufficient documentation

## 2023-04-25 DIAGNOSIS — Z79899 Other long term (current) drug therapy: Secondary | ICD-10-CM | POA: Insufficient documentation

## 2023-04-25 DIAGNOSIS — Z951 Presence of aortocoronary bypass graft: Secondary | ICD-10-CM | POA: Insufficient documentation

## 2023-04-25 DIAGNOSIS — N1832 Chronic kidney disease, stage 3b: Secondary | ICD-10-CM | POA: Insufficient documentation

## 2023-04-25 DIAGNOSIS — K118 Other diseases of salivary glands: Secondary | ICD-10-CM | POA: Insufficient documentation

## 2023-04-25 DIAGNOSIS — R093 Abnormal sputum: Principal | ICD-10-CM | POA: Insufficient documentation

## 2023-04-25 DIAGNOSIS — I10 Essential (primary) hypertension: Secondary | ICD-10-CM | POA: Diagnosis present

## 2023-04-25 DIAGNOSIS — K219 Gastro-esophageal reflux disease without esophagitis: Secondary | ICD-10-CM | POA: Diagnosis present

## 2023-04-25 DIAGNOSIS — I5032 Chronic diastolic (congestive) heart failure: Secondary | ICD-10-CM | POA: Insufficient documentation

## 2023-04-25 DIAGNOSIS — Z952 Presence of prosthetic heart valve: Secondary | ICD-10-CM | POA: Insufficient documentation

## 2023-04-25 DIAGNOSIS — N179 Acute kidney failure, unspecified: Principal | ICD-10-CM | POA: Insufficient documentation

## 2023-04-25 DIAGNOSIS — R0989 Other specified symptoms and signs involving the circulatory and respiratory systems: Secondary | ICD-10-CM | POA: Diagnosis present

## 2023-04-25 DIAGNOSIS — E1165 Type 2 diabetes mellitus with hyperglycemia: Secondary | ICD-10-CM | POA: Insufficient documentation

## 2023-04-25 DIAGNOSIS — Z7901 Long term (current) use of anticoagulants: Secondary | ICD-10-CM | POA: Insufficient documentation

## 2023-04-25 DIAGNOSIS — I480 Paroxysmal atrial fibrillation: Secondary | ICD-10-CM | POA: Insufficient documentation

## 2023-04-25 DIAGNOSIS — I2510 Atherosclerotic heart disease of native coronary artery without angina pectoris: Secondary | ICD-10-CM | POA: Insufficient documentation

## 2023-04-25 DIAGNOSIS — Z7982 Long term (current) use of aspirin: Secondary | ICD-10-CM | POA: Insufficient documentation

## 2023-04-25 DIAGNOSIS — G4733 Obstructive sleep apnea (adult) (pediatric): Secondary | ICD-10-CM | POA: Diagnosis present

## 2023-04-25 LAB — BASIC METABOLIC PANEL
Anion gap: 11 (ref 5–15)
BUN: 20 mg/dL (ref 6–20)
CO2: 22 mmol/L (ref 22–32)
Calcium: 8.3 mg/dL — ABNORMAL LOW (ref 8.9–10.3)
Chloride: 102 mmol/L (ref 98–111)
Creatinine, Ser: 2.28 mg/dL — ABNORMAL HIGH (ref 0.61–1.24)
GFR, Estimated: 32 mL/min — ABNORMAL LOW (ref >60)
Glucose, Bld: 119 mg/dL — ABNORMAL HIGH (ref 70–99)
Potassium: 3.6 mmol/L (ref 3.5–5.1)
Sodium: 135 mmol/L (ref 135–145)

## 2023-04-25 LAB — CBC WITH DIFFERENTIAL/PLATELET
Abs Immature Granulocytes: 0.04 10*3/uL (ref 0.00–0.07)
Basophils Absolute: 0.1 10*3/uL (ref 0.0–0.1)
Basophils Relative: 1 %
Eosinophils Absolute: 0.7 10*3/uL — ABNORMAL HIGH (ref 0.0–0.5)
Eosinophils Relative: 7 %
HCT: 35.7 % — ABNORMAL LOW (ref 39.0–52.0)
Hemoglobin: 11.9 g/dL — ABNORMAL LOW (ref 13.0–17.0)
Immature Granulocytes: 0 %
Lymphocytes Relative: 19 %
Lymphs Abs: 2 10*3/uL (ref 0.7–4.0)
MCH: 29.6 pg (ref 26.0–34.0)
MCHC: 33.3 g/dL (ref 30.0–36.0)
MCV: 88.8 fL (ref 80.0–100.0)
Monocytes Absolute: 0.7 10*3/uL (ref 0.1–1.0)
Monocytes Relative: 6 %
Neutro Abs: 7.2 10*3/uL (ref 1.7–7.7)
Neutrophils Relative %: 67 %
Platelets: 213 10*3/uL (ref 150–400)
RBC: 4.02 MIL/uL — ABNORMAL LOW (ref 4.22–5.81)
RDW: 14.6 % (ref 11.5–15.5)
WBC: 10.7 10*3/uL — ABNORMAL HIGH (ref 4.0–10.5)
nRBC: 0 % (ref 0.0–0.2)

## 2023-04-25 LAB — TROPONIN I (HIGH SENSITIVITY): Troponin I (High Sensitivity): 23 ng/L — ABNORMAL HIGH (ref <18)

## 2023-04-25 MED ORDER — SODIUM CHLORIDE 0.9 % IV SOLN: INTRAVENOUS | Status: DC

## 2023-04-25 MED ORDER — SODIUM CHLORIDE 0.9 % IV BOLUS (SEPSIS)
1000.0000 mL | Freq: Once | INTRAVENOUS | Status: AC
  Administered 2023-04-26: 1000 mL via INTRAVENOUS

## 2023-04-25 MED ORDER — GUAIFENESIN 100 MG/5ML PO LIQD
5.0000 mL | Freq: Once | ORAL | Status: AC
  Administered 2023-04-26: 5 mL via ORAL
  Filled 2023-04-25: qty 10

## 2023-04-25 NOTE — ED Provider Notes (Signed)
[facility] Provider Note    Event Date/Time   First MD Initiated Contact with Patient 04/25/23 2303     (approximate)   History   Chest Pain   HPI  Craig Reese is a 62 y.o. male with history of endocarditis status post bioprosthetic aortic valve, CHF, chronic kidney disease, hypertension, diabetes, atrial fibrillation who presents to the emergency department with complaints of discomfort in his throat for the past several days.  He states that he feels that there is phlegm in his throat that is causing him to have a hard time swallowing and breathing.  He is able to talk normally and swallow his own saliva.  No fever.  Also complaining of "heart pain".  No shortness of breath.  No vomiting or diarrhea.  Reports he feels like this may be from his thyroid because he has been without his thyroid medications for 2 months.   History provided by patient using Spanish interpreter.    Past Medical History:  Diagnosis Date   Aortic valve endocarditis    a. 2015 s/p bioprosthetic AVR.   Chronic chest pain    Chronic heart failure with preserved ejection fraction (HFpEF) (HCC)    a. 08/2022 Echo: Ef 55-60%, no rwma, GrII DD, nl RV fxn, sev dil LA, mod dil RA, mild MR, mod AS (mean grad 29.23mmHg, AVA 1.24cm^2).   CKD (chronic kidney disease), stage III (HCC)    Coronary artery disease    a. 2015 s/p CABG x 1 (VG->OM); b. 04/2020 PET CT: apical, apical inf, mid inf, and basal inf ischemia. EF 41%; c. 05/2020 Cath: Sev OM4 dzs, patent graft, mod diff dzs; d. 07/2022 Cath [facility]): LM nl, LAD 40-50, LCX nl, OM4 80, RCA 50-60p, VG->OM 50p->Med rx.   Diabetes mellitus type II, non insulin dependent (HCC) 11/17/2018   Elevated troponin (chronic)    Essential hypertension    Hyperlipidemia LDL goal <70    Hypothyroidism 11/17/2018   Normocytic anemia    OSA (obstructive sleep apnea)    a. not on CPAP.   Persistent atrial fibrillation (HCC)    a. CHA2DS2VASc  = 4-->xarelto/amio.   RBBB    Thoracic aortic aneurysm (HCC)    a. 08/2022 CT - 4.1cm.    Past Surgical History:  Procedure Laterality Date   CORONARY ARTERY BYPASS GRAFT     LEFT HEART CATH AND CORS/GRAFTS ANGIOGRAPHY N/A 05/17/2018   Procedure: LEFT HEART CATH AND CORS/GRAFTS ANGIOGRAPHY and possible PCI and stent;  Surgeon: Alwyn Pea, MD;  Location: [facility] INVASIVE CV LAB;  Service: Cardiovascular;  Laterality: N/A;   RIGHT/LEFT HEART CATH AND CORONARY/GRAFT ANGIOGRAPHY N/A 10/09/2022   Procedure: RIGHT/LEFT HEART CATH AND CORONARY/GRAFT ANGIOGRAPHY;  Surgeon: Marykay Lex, MD;  Location: [facility] INVASIVE CV LAB;  Service: Cardiovascular;  Laterality: N/A;    MEDICATIONS:  Prior to Admission medications   Medication Sig Start Date End Date Taking? Authorizing Provider  amiodarone (PACERONE) 200 MG tablet Take 1 tablet (200 mg total) by mouth daily. Patient not taking: Reported on 10/09/2022 09/03/22   Sunnie Nielsen, DO  aspirin 81 MG chewable tablet Chew 1 tablet (81 mg total) by mouth daily. 10/11/22   Alford Highland, MD  atorvastatin (LIPITOR) 80 MG tablet Take 80 mg by mouth every evening.    [provider]  canagliflozin (INVOKANA) 300 MG TABS tablet Take 300 mg by mouth daily before breakfast.    [provider]  diclofenac Sodium (VOLTAREN) 1 % GEL  Apply 2 g topically 4 (four) times daily as needed. 09/08/22   Marrion Coy, MD  furosemide (LASIX) 40 MG tablet Take 1 tablet (40 mg total) by mouth daily as needed. for up to 3 days for increased leg swelling, shortness of breath, weight gain 5+ lbs over 1-2 days. Seek medical care if these symptoms are not improving with increased dose. 08/26/22   Sunnie Nielsen, DO  gabapentin (NEURONTIN) 300 MG capsule Take 300 mg by mouth 2 (two) times daily.    [provider]  heparin 17001 UT/250ML infusion Inject 1,600 Units/hr into the vein continuous. 10/10/22   Alford Highland, MD  isosorbide  mononitrate (IMDUR) 30 MG 24 hr tablet Take 1 tablet (30 mg total) by mouth daily. Patient not taking: Reported on 10/09/2022 09/08/22   Marrion Coy, MD  levothyroxine (SYNTHROID) 300 MCG tablet Take 300 mcg by mouth daily before breakfast. 05/10/22 05/10/23  [provider]  metoprolol tartrate (LOPRESSOR) 25 MG tablet Take 0.5 tablets (12.5 mg total) by mouth 2 (two) times daily. 10/10/22   Alford Highland, MD  oxyCODONE (OXY IR/ROXICODONE) 5 MG immediate release tablet Take 1 tablet (5 mg total) by mouth every 4 (four) hours as needed for moderate pain. 10/10/22   Alford Highland, MD  pantoprazole (PROTONIX) 20 MG tablet Take 1 tablet (20 mg total) by mouth 2 (two) times daily. 03/09/22 10/08/22  Tresa Moore, MD  potassium chloride SA (KLOR-CON M) 20 MEQ tablet Take 1 tablet (20 mEq total) by mouth daily. 10/11/22   Alford Highland, MD  sucralfate (CARAFATE) 1 GM/10ML suspension Take 1 g by mouth 4 (four) times daily -  with meals and at bedtime.    [provider]    Physical Exam   Triage Vital Signs: ED Triage Vitals  Encounter Vitals Group     BP 04/25/23 2230 120/79     Systolic BP Percentile --      Diastolic BP Percentile --      Pulse Rate 04/25/23 2230 84     Resp 04/25/23 2230 (!) 21     Temp 04/25/23 2237 98.6 F (37 C)     Temp Source 04/25/23 2237 Oral     SpO2 04/25/23 2230 100 %     Weight --      Height --      Head Circumference --      Peak Flow --      Pain Score 04/25/23 2237 4     Pain Loc --      Pain Education --      Exclude from Growth Chart --     Most recent vital signs: Vitals:   04/26/23 0030 04/26/23 0100  BP: 116/72 102/67  Pulse: 85 82  Resp: 16 17  Temp:    SpO2: 100% 95%    CONSTITUTIONAL: Alert, responds appropriately to questions. Well-appearing; well-nourished HEAD: Normocephalic, atraumatic EYES: Conjunctivae clear, pupils appear equal, sclera nonicteric ENT: normal nose; moist mucous membranes; No  pharyngeal erythema or petechiae, no tonsillar hypertrophy or exudate, no uvular deviation, no unilateral swelling in posterior oropharynx, no trismus or drooling, no muffled voice, normal phonation, no stridor, airway patent.  Patient does have some sinus drainage noted in the posterior oropharynx. NECK: Supple, normal ROM, no significant thyromegaly appreciated, no cervical lymphadenopathy, no meningismus CARD: RRR; S1 and S2 appreciated RESP: Normal chest excursion without splinting or tachypnea; breath sounds clear and equal bilaterally; no wheezes, no rhonchi, no rales, no hypoxia or respiratory distress,  speaking full sentences ABD/GI: Non-distended; soft, non-tender, no rebound, no guarding, no peritoneal signs BACK: The back appears normal EXT: Normal ROM in all joints; no deformity noted, no edema, no calf tenderness or calf swelling SKIN: Normal color for age and race; warm; no rash on exposed skin NEURO: Moves all extremities equally, normal speech PSYCH: The patient's mood and manner are appropriate.   ED Results / Procedures / Treatments   LABS: (all labs ordered are listed, but only abnormal results are displayed) Labs Reviewed  CBC WITH DIFFERENTIAL/PLATELET - Abnormal; Notable for the following components:      Result Value   WBC 10.7 (*)    RBC 4.02 (*)    Hemoglobin 11.9 (*)    HCT 35.7 (*)    Eosinophils Absolute 0.7 (*)    All other components within normal limits  BASIC METABOLIC PANEL - Abnormal; Notable for the following components:   Glucose, Bld 119 (*)    Creatinine, Ser 2.28 (*)    Calcium 8.3 (*)    GFR, Estimated 32 (*)    All other components within normal limits  TROPONIN I (HIGH SENSITIVITY) - Abnormal; Notable for the following components:   Troponin I (High Sensitivity) 23 (*)    All other components within normal limits  RESP PANEL BY RT-PCR (RSV, FLU A&B, COVID)  RVPGX2  GROUP A STREP BY PCR  TSH  T4, FREE  TROPONIN I (HIGH SENSITIVITY)      EKG:  EKG Interpretation Date/Time:  Saturday April 25 2023 22:34:06 EST Ventricular Rate:  85 PR Interval:  196 QRS Duration:  139 QT Interval:  468 QTC Calculation: 557 R Axis:   247  Text Interpretation: Sinus rhythm Right bundle branch block Inferior infarct, old No significant change since last tracing Confirmed by Rochele Raring [phone]) on 04/25/2023 11:14:37 PM         RADIOLOGY: My personal review and interpretation of imaging: Chest x-ray clear.  I have personally reviewed all radiology reports.   DG Chest Portable 1 View  Result Date: 04/25/2023 CLINICAL DATA:  Chest pain EXAM: PORTABLE CHEST 1 VIEW COMPARISON:  10/07/2022 FINDINGS: Prior CABG. Heart and mediastinal contours are within normal limits. No focal opacities or effusions. No acute bony abnormality. IMPRESSION: No active cardiopulmonary disease. Electronically Signed   By: Charlett Nose M.D.   On: 04/25/2023 23:16     PROCEDURES:  Critical Care performed: No     .1-3 Lead EKG Interpretation  Performed by: Adham Johnson, Layla Maw, DO Authorized by: Lonnie Rosado, Layla Maw, DO     Interpretation: normal     ECG rate:  82   ECG rate assessment: normal     Rhythm: sinus rhythm     Ectopy: none     Conduction: normal       IMPRESSION / MDM / ASSESSMENT AND PLAN / ED COURSE  I reviewed the triage vital signs and the nursing notes.    Patient here with discomfort in his throat, phlegm, chest pain.  The patient is on the cardiac monitor to evaluate for evidence of arrhythmia and/or significant heart rate changes.   DIFFERENTIAL DIAGNOSIS (includes but not limited to):   Viral URI, postnasal drip, no signs of tonsillitis, deep space neck infection, PTA, uvulitis, airway obstruction.  As for his chest pain, differential includes ACS, CHF, pneumonia, pneumothorax, less likely dissection, PE.   Patient's presentation is most consistent with acute presentation with potential threat to life or bodily  function.   PLAN: Patient  has a slight leukocytosis of 10.7.  Creatinine elevated today at 2.2 which is up from 1.2 in May 2024.  First troponin is 23.  Second pending.  Chest x-ray reviewed and interpreted by myself and the radiologist and shows no acute abnormality.  Will add on thyroid function tests.  Will check COVID, flu and strep test as well.  He has no clinical signs of deep space neck infection, PTA or uvulitis.  His airway is patent.  He seems mostly concerned about the postnasal drip.  Will give guaifenesin for symptomatic relief.  No indication for CT of the neck today.  Patient will need admission given his new AKI.  Will give IV fluids.   MEDICATIONS GIVEN IN ED: Medications  0.9 %  sodium chloride infusion ( Intravenous New Bag/Given 04/26/23 0015)  sodium chloride 0.9 % bolus 1,000 mL (0 mLs Intravenous Stopped 04/26/23 0045)  guaiFENesin (ROBITUSSIN) 100 MG/5ML liquid 5 mL (5 mLs Oral Given 04/26/23 0014)     ED COURSE: COVID, flu, RSV, strep negative.   CONSULTS:  Consulted and discussed patient's case with hospitalist, Dr. Allena Katz.  I have recommended admission and consulting physician agrees and will place admission orders.  Patient (and family if present) agree with this plan.   I reviewed all nursing notes, vitals, pertinent previous records.  All labs, EKGs, imaging ordered have been independently reviewed and interpreted by myself.    OUTSIDE RECORDS REVIEWED: Reviewed last internal medicine visit on 10/24/2022.  Reviewed last hospitalization in May 2024.       FINAL CLINICAL IMPRESSION(S) / ED DIAGNOSES   Final diagnoses:  AKI (acute kidney injury) (HCC)     Rx / DC Orders   ED Discharge Orders     None        Note:  This document was prepared using Dragon voice recognition software and may include unintentional dictation errors.   Benedetta Sundstrom, Layla Maw, DO 04/26/23 [phone]

## 2023-04-25 NOTE — ED Triage Notes (Signed)
Pt BIB EMS from home for chest pain. Pt was hypertensive in EMS, was given 1 inch paste of  on chest, pt does not speak english well, so communication barrier. It appears that the pt hasn't been taking his thyroid medication either and feels like there's something in his throat--pt clearing throat repeatedly.  Has IV established

## 2023-04-26 ENCOUNTER — Observation Stay: Payer: Self-pay

## 2023-04-26 ENCOUNTER — Encounter: Payer: Self-pay | Admitting: Internal Medicine

## 2023-04-26 DIAGNOSIS — R0989 Other specified symptoms and signs involving the circulatory and respiratory systems: Secondary | ICD-10-CM | POA: Diagnosis present

## 2023-04-26 LAB — COMPREHENSIVE METABOLIC PANEL
ALT: 18 U/L (ref 0–44)
AST: 23 U/L (ref 15–41)
Albumin: 4.1 g/dL (ref 3.5–5.0)
Alkaline Phosphatase: 96 U/L (ref 38–126)
Anion gap: 12 (ref 5–15)
BUN: 20 mg/dL (ref 6–20)
CO2: 22 mmol/L (ref 22–32)
Calcium: 8.1 mg/dL — ABNORMAL LOW (ref 8.9–10.3)
Chloride: 102 mmol/L (ref 98–111)
Creatinine, Ser: 2.03 mg/dL — ABNORMAL HIGH (ref 0.61–1.24)
GFR, Estimated: 37 mL/min — ABNORMAL LOW (ref >60)
Glucose, Bld: 80 mg/dL (ref 70–99)
Potassium: 3.3 mmol/L — ABNORMAL LOW (ref 3.5–5.1)
Sodium: 136 mmol/L (ref 135–145)
Total Bilirubin: 0.7 mg/dL (ref <1.2)
Total Protein: 7.8 g/dL (ref 6.5–8.1)

## 2023-04-26 LAB — GROUP A STREP BY PCR
Group A Strep by PCR: NOT DETECTED
Group A Strep by PCR: NOT DETECTED

## 2023-04-26 LAB — CBC
HCT: 33.9 % — ABNORMAL LOW (ref 39.0–52.0)
Hemoglobin: 11.1 g/dL — ABNORMAL LOW (ref 13.0–17.0)
MCH: 29.7 pg (ref 26.0–34.0)
MCHC: 32.7 g/dL (ref 30.0–36.0)
MCV: 90.6 fL (ref 80.0–100.0)
Platelets: 192 10*3/uL (ref 150–400)
RBC: 3.74 MIL/uL — ABNORMAL LOW (ref 4.22–5.81)
RDW: 14.7 % (ref 11.5–15.5)
WBC: 9.5 10*3/uL (ref 4.0–10.5)
nRBC: 0 % (ref 0.0–0.2)

## 2023-04-26 LAB — CBG MONITORING, ED
Glucose-Capillary: 63 mg/dL — ABNORMAL LOW (ref 70–99)
Glucose-Capillary: 75 mg/dL (ref 70–99)
Glucose-Capillary: 103 mg/dL — ABNORMAL HIGH (ref 70–99)
Glucose-Capillary: 82 mg/dL (ref 70–99)

## 2023-04-26 LAB — TSH: TSH: 80 u[IU]/mL — ABNORMAL HIGH (ref 0.350–4.500)

## 2023-04-26 LAB — MONONUCLEOSIS SCREEN: Mono Screen: NEGATIVE

## 2023-04-26 LAB — PROCALCITONIN: Procalcitonin: <0.1 ng/mL

## 2023-04-26 LAB — RESP PANEL BY RT-PCR (RSV, FLU A&B, COVID)  RVPGX2
Influenza A by PCR: NEGATIVE
Influenza B by PCR: NEGATIVE
Resp Syncytial Virus by PCR: NEGATIVE
SARS Coronavirus 2 by RT PCR: NEGATIVE

## 2023-04-26 LAB — HIV ANTIBODY (ROUTINE TESTING W REFLEX): HIV Screen 4th Generation wRfx: NONREACTIVE

## 2023-04-26 LAB — HEMOGLOBIN A1C
Hgb A1c MFr Bld: 6.7 % — ABNORMAL HIGH (ref 4.8–5.6)
Mean Plasma Glucose: 145.59 mg/dL

## 2023-04-26 LAB — TROPONIN I (HIGH SENSITIVITY): Troponin I (High Sensitivity): 28 ng/L — ABNORMAL HIGH (ref <18)

## 2023-04-26 LAB — T4, FREE: Free T4: 0.41 ng/dL — ABNORMAL LOW (ref 0.61–1.12)

## 2023-04-26 LAB — GLUCOSE, CAPILLARY: Glucose-Capillary: 93 mg/dL (ref 70–99)

## 2023-04-26 MED ORDER — LEVOTHYROXINE SODIUM 100 MCG/5ML IV SOLN: 50.0000 ug | Freq: Every day | INTRAVENOUS | Status: DC

## 2023-04-26 MED ORDER — ACETAMINOPHEN 650 MG RE SUPP: 650.0000 mg | Freq: Four times a day (QID) | RECTAL | Status: DC | PRN

## 2023-04-26 MED ORDER — LEVOTHYROXINE SODIUM 100 MCG/5ML IV SOLN
100.0000 ug | Freq: Every day | INTRAVENOUS | Status: DC
  Administered 2023-04-26: 100 ug via INTRAVENOUS
  Filled 2023-04-26: qty 5

## 2023-04-26 MED ORDER — GABAPENTIN 300 MG PO CAPS
300.0000 mg | ORAL_CAPSULE | Freq: Two times a day (BID) | ORAL | Status: DC
  Administered 2023-04-26 – 2023-04-28 (×4): 300 mg via ORAL
  Filled 2023-04-26 (×5): qty 1

## 2023-04-26 MED ORDER — LEVOTHYROXINE SODIUM 100 MCG PO TABS
300.0000 ug | ORAL_TABLET | Freq: Every day | ORAL | Status: DC
  Administered 2023-04-26 – 2023-04-28 (×3): 300 ug via ORAL
  Filled 2023-04-26 (×2): qty 3
  Filled 2023-04-26 (×2): qty 6

## 2023-04-26 MED ORDER — ACETAMINOPHEN 325 MG PO TABS
650.0000 mg | ORAL_TABLET | Freq: Four times a day (QID) | ORAL | Status: DC | PRN
  Administered 2023-04-26 (×2): 650 mg via ORAL
  Filled 2023-04-26 (×2): qty 2

## 2023-04-26 MED ORDER — SODIUM CHLORIDE 0.9% FLUSH
3.0000 mL | Freq: Two times a day (BID) | INTRAVENOUS | Status: DC
  Administered 2023-04-26 – 2023-04-28 (×5): 3 mL via INTRAVENOUS

## 2023-04-26 MED ORDER — SODIUM CHLORIDE 0.9 % IV SOLN: INTRAVENOUS | Status: AC

## 2023-04-26 MED ORDER — IOHEXOL 300 MG/ML  SOLN
60.0000 mL | Freq: Once | INTRAMUSCULAR | Status: AC | PRN
  Administered 2023-04-26: 60 mL via INTRAVENOUS

## 2023-04-26 MED ORDER — METOPROLOL TARTRATE 25 MG PO TABS
12.5000 mg | ORAL_TABLET | Freq: Two times a day (BID) | ORAL | Status: DC
Start: 2023-04-26 — End: 2023-04-28
  Administered 2023-04-26 – 2023-04-28 (×5): 12.5 mg via ORAL
  Filled 2023-04-26 (×6): qty 1

## 2023-04-26 MED ORDER — ASPIRIN 81 MG PO CHEW
81.0000 mg | CHEWABLE_TABLET | Freq: Every day | ORAL | Status: DC
  Administered 2023-04-26 – 2023-04-27 (×2): 81 mg via ORAL
  Filled 2023-04-26 (×2): qty 1

## 2023-04-26 MED ORDER — NITROGLYCERIN 0.4 MG SL SUBL
0.4000 mg | SUBLINGUAL_TABLET | SUBLINGUAL | Status: DC | PRN
  Administered 2023-04-26: 0.4 mg via SUBLINGUAL
  Filled 2023-04-26: qty 1

## 2023-04-26 MED ORDER — AMIODARONE HCL 200 MG PO TABS
200.0000 mg | ORAL_TABLET | Freq: Every day | ORAL | Status: DC
  Administered 2023-04-26 – 2023-04-28 (×3): 200 mg via ORAL
  Filled 2023-04-26 (×3): qty 1

## 2023-04-26 MED ORDER — AMIODARONE HCL 200 MG PO TABS: 100.0000 mg | ORAL_TABLET | Freq: Every day | ORAL | Status: DC

## 2023-04-26 MED ORDER — ATORVASTATIN CALCIUM 80 MG PO TABS
80.0000 mg | ORAL_TABLET | Freq: Every evening | ORAL | Status: DC
  Administered 2023-04-26 – 2023-04-27 (×2): 80 mg via ORAL
  Filled 2023-04-26: qty 4
  Filled 2023-04-26: qty 1

## 2023-04-26 MED ORDER — INSULIN ASPART 100 UNIT/ML IJ SOLN
0.0000 [IU] | Freq: Three times a day (TID) | INTRAMUSCULAR | Status: DC
  Administered 2023-04-27: 1 [IU] via SUBCUTANEOUS
  Filled 2023-04-26: qty 1

## 2023-04-26 MED ORDER — LEVOTHYROXINE SODIUM 50 MCG PO TABS: 300.0000 ug | ORAL_TABLET | Freq: Every day | ORAL | Status: DC

## 2023-04-26 MED ORDER — SODIUM CHLORIDE 0.9 % IV SOLN: Freq: Once | INTRAVENOUS | Status: DC

## 2023-04-26 MED ORDER — LORATADINE 10 MG PO TABS
10.0000 mg | ORAL_TABLET | Freq: Every day | ORAL | Status: DC
  Administered 2023-04-26 – 2023-04-28 (×3): 10 mg via ORAL
  Filled 2023-04-26 (×3): qty 1

## 2023-04-26 NOTE — Assessment & Plan Note (Signed)
Patient is status post TAVR at [facility] in May of this year.

## 2023-04-26 NOTE — Assessment & Plan Note (Signed)
CPAP per home settings.   

## 2023-04-26 NOTE — Assessment & Plan Note (Signed)
Avoid additional QT prolonging meds like Zofran and azithromycin.

## 2023-04-26 NOTE — Assessment & Plan Note (Signed)
Vitals:   04/25/23 2230 04/25/23 2300 04/25/23 2330 04/26/23 0002  BP: 120/79 112/77 137/85 118/73   04/26/23 0030 04/26/23 0100  BP: 116/72 102/67  Patient continued on metoprolol 12.5 twice daily.  Invokana held.

## 2023-04-26 NOTE — Assessment & Plan Note (Signed)
Lab Results  Component Value Date   CREATININE 2.28 (H) 04/25/2023   CREATININE 1.23 10/10/2022   CREATININE 1.18 10/09/2022   Intake/Output Summary (Last 24 hours) at 04/26/2023 0243 Last data filed at 04/26/2023 0045 Gross per 24 hour  Intake 1000 ml  Output --  Net 1000 ml  Cont with ns at 75

## 2023-04-26 NOTE — Progress Notes (Addendum)
Patient is a very poor historian. Keeps his eyes closed during my evaluation. Spanish interpreter in the room. Patient tells [place] that he came in with pain in his left side of the neck. He had trouble swallowing however he was able to swallow pills and eat breakfast this morning along with banana and drink fluids. Some nonspecific chest pain. Denies any fever. Had some initial shortness of breath however sats are stable.  Patient is not able to give much details regarding the same. He has been followed at [facility] regarding right parodic mass biopsy was done per records in February by ENT. Patient has not been consistently able to follow-up given multiple social barriers.  He also has undergone TAVR for severe aortic stenosis at [facility]. Last visit with PCP/cardiology appears to be in June 2024.  Patient was found to be dehydrated on labs. Baseline creatinine 1.2. Came in with creatinine of 2.28 receiving IV fluids. Encourage oral intake. He refused to work with speech therapist.  I have ordered CT scan of the neck. Will continue fluids for now and monitor creatinine. Check strep throat  I was able to get in touch with his niece Genesis  [phone]. Discussed with her regarding follow-up at [facility].  Will work on discharge plan pending improvement and creatinine and CT soft tissue neck.  Med rec needs to be done. Patient is on multiple meds. Will resume once completed.  No charge

## 2023-04-26 NOTE — ED Notes (Signed)
Patient was resting comfortably.

## 2023-04-26 NOTE — Hospital Course (Addendum)
Pt states : Chest pain    Pt has headache.

## 2023-04-26 NOTE — Assessment & Plan Note (Addendum)
    Latest Ref Rng & Units 04/25/2023   10:30 PM 10/10/2022    4:35 AM 10/09/2022    5:31 PM  CBC  WBC 4.0 - 10.5 K/uL 10.7  11.8    Hemoglobin 13.0 - 17.0 g/dL 52.8  8.5  8.8   Hematocrit 39.0 - 52.0 % 35.7  27.9  26.0   Platelets 150 - 400 K/uL 213  295    Type and screen, IV PPI.

## 2023-04-26 NOTE — ED Notes (Signed)
Assumed care of the patient. Received report from the previous nurse. Patient appears to be sleeping comfortably at this time.

## 2023-04-26 NOTE — Assessment & Plan Note (Signed)
Pt needs ct scan head and neck with contrast and is followed at [facility] we will get if creatinine goes down otherwise he can follow with [facility].

## 2023-04-26 NOTE — Progress Notes (Signed)
SLP Cancellation Note  Patient Details Name: Craig Reese MRN: [id number] DOB: 1961-02-28   Cancelled treatment:       Reason Eval/Treat Not Completed: Patient declined, no reason specified (Utilized videointerpeter via iPad. Despite education/encouragement, pt declined POs. Agreeable to trying evaluation tomorrow.)  Clyde Canterbury, M.S., CCC-SLP Speech-Language Pathologist [facility] [phone] Arnette Felts)  Woodroe Chen 04/26/2023, 9:21 AM

## 2023-04-26 NOTE — ED Notes (Signed)
CBG 63. Pt about to to eat breakfast tray. Will recheck CBG.

## 2023-04-26 NOTE — H&P (Signed)
History and Physical    Patient: Craig Reese QMV:[id number] DOB: January 26, 1961 DOA: 04/25/2023 DOS: the patient was seen and examined on 04/26/2023 PCP: Katrina Stack, MD  Patient coming from: Home  Chief Complaint:  Chief Complaint  Patient presents with   Chest Pain    HPI: Craig Reese is a 62 y.o. male with medical history significant for coronary artery disease with single bypass grafting,LHC 07/2022 with stable CAD no culprit lesion , paroxysmal A-fib on Xarelto and amiodarone, chronic diastolic CHF, type 2 diabetes, hypothyroidism,osa, and right parotid mass followed by [facility] with plans of surgery,OSA,CKDIII who presented ED with complaint of central chest pain.  Hypertensive and EMS was given and check Nitropaste.  Report patient has not been taking his thyroid medication. Chart review shows that patient had aortic valve replacement. See below: "62 y.o. male whose presentation is complicated by T2DM, hypothyroidism, CAD s/p 1v CABG (SVG-OM1) 2015, SAVR 2015, CHF, paroxysmal afib on Xarelto, and HFpEF that presented to [facility] with worsening chest pain on 5/21, underwent LHC on 5/23 with a bioprosthetic aortic valve gradient of 80 mm mean and 90 peak. Transferred to [facility] for further evaluation of aortic stenosis. Went into A-fib with RVR on 5/26, became bradycardic and went into asystole, required 2 rounds of CPR before achieving ROSC. Underwent TAVR on 5/30. "  >>>At bedside to me pt states he came for phlegm. No chest pain or sob.  Phlegm is stuck in his throat. 5 days , didn't want to come to hospital , he feels like he is choking, + sob ,  phlegm is normal color. He drink water over and over and has dry mouth. Sugar are unknown he has not checked. Has not taken synthroid fro few months he says 2 months but not sure.  Pt Denies any alcohol/ smoking / or drugs . I have run out synthroid  and takes other meds.  Pt has neck pain and swelling  like a ball x  +++ PND  /  +++coughing / +++chills / +++ congestion x 3 days. No nausea/ vomiting/ diarrhea. Says my thyroid removed because is was cancerous.  Pt is a limited historian.  Says neck mass has been there since 2015 and has been tender since 2015.   In emergency room vitals trend shows: Vitals:   04/26/23 0002 04/26/23 0030 04/26/23 0100 04/26/23 0224  BP: 118/73 116/72 102/67   Pulse: 87 85 82   Temp:      Resp: 16 16 17    Weight:    121.6 kg  SpO2: 99% 100% 95%   TempSrc:      EKG shows sinus rhythm with 85 QTc of 557 right bundle branch block similar to previous EKGs. Labs are notable for : Metabolic panel shows acute kidney injury with creatinine of 2.28 EGFR of 32. Leukocytosis of 10.7 anemia with a hemoglobin of 11.9 normal MCV normal platelets. Respiratory panel is pending for RSV flu and COVID. Chest x-ray was negative for any acute cardiopulmonary disease.  In the ED pt received: Medications  sodium chloride flush (NS) 0.9 % injection 3 mL (has no administration in time range)  0.9 %  sodium chloride infusion (has no administration in time range)  acetaminophen (TYLENOL) tablet 650 mg (has no administration in time range)    Or  acetaminophen (TYLENOL) suppository 650 mg (has no administration in time range)  loratadine (CLARITIN) tablet 10 mg (has no administration in time range)  nitroGLYCERIN (NITROSTAT) SL tablet  0.4 mg (has no administration in time range)  aspirin chewable tablet 81 mg (has no administration in time range)  atorvastatin (LIPITOR) tablet 80 mg (has no administration in time range)  gabapentin (NEURONTIN) capsule 300 mg (has no administration in time range)  metoprolol tartrate (LOPRESSOR) tablet 12.5 mg (has no administration in time range)  levothyroxine (SYNTHROID, LEVOTHROID) injection 100 mcg (has no administration in time range)  amiodarone (PACERONE) tablet 200 mg (has no administration in time range)  sodium chloride 0.9 %  bolus 1,000 mL (0 mLs Intravenous Stopped 04/26/23 0045)  guaiFENesin (ROBITUSSIN) 100 MG/5ML liquid 5 mL (5 mLs Oral Given 04/26/23 0014)   Review of Systems  Respiratory:  Positive for cough, sputum production and shortness of breath.        Pt has c/o PND and dry mouth and phlegm .  Cardiovascular:  Negative for chest pain.   Past Medical History:  Diagnosis Date   Aortic valve endocarditis    a. 2015 s/p bioprosthetic AVR.   Chronic chest pain    Chronic heart failure with preserved ejection fraction (HFpEF) (HCC)    a. 08/2022 Echo: Ef 55-60%, no rwma, GrII DD, nl RV fxn, sev dil LA, mod dil RA, mild MR, mod AS (mean grad 29.8mmHg, AVA 1.24cm^2).   CKD (chronic kidney disease), stage III (HCC)    Coronary artery disease    a. 2015 s/p CABG x 1 (VG->OM); b. 04/2020 PET CT: apical, apical inf, mid inf, and basal inf ischemia. EF 41%; c. 05/2020 Cath: Sev OM4 dzs, patent graft, mod diff dzs; d. 07/2022 Cath [facility]): LM nl, LAD 40-50, LCX nl, OM4 80, RCA 50-60p, VG->OM 50p->Med rx.   Diabetes mellitus type II, non insulin dependent (HCC) 11/17/2018   Elevated troponin (chronic)    Essential hypertension    Hyperlipidemia LDL goal <70    Hypothyroidism 11/17/2018   Normocytic anemia    OSA (obstructive sleep apnea)    a. not on CPAP.   Persistent atrial fibrillation (HCC)    a. CHA2DS2VASc = 4-->xarelto/amio.   RBBB    Thoracic aortic aneurysm (HCC)    a. 08/2022 CT - 4.1cm.   Past Surgical History:  Procedure Laterality Date   CORONARY ARTERY BYPASS GRAFT     LEFT HEART CATH AND CORS/GRAFTS ANGIOGRAPHY N/A 05/17/2018   Procedure: LEFT HEART CATH AND CORS/GRAFTS ANGIOGRAPHY and possible PCI and stent;  Surgeon: Alwyn Pea, MD;  Location: [facility] INVASIVE CV LAB;  Service: Cardiovascular;  Laterality: N/A;   RIGHT/LEFT HEART CATH AND CORONARY/GRAFT ANGIOGRAPHY N/A 10/09/2022   Procedure: RIGHT/LEFT HEART CATH AND CORONARY/GRAFT ANGIOGRAPHY;  Surgeon: Marykay Lex, MD;  Location:  [facility] INVASIVE CV LAB;  Service: Cardiovascular;  Laterality: N/A;    reports that he has never smoked. He has never used smokeless tobacco. He reports that he does not currently use alcohol. He reports that he does not use drugs.  No Known Allergies  Family History  Problem Relation Age of Onset   Heart disease Brother     Prior to Admission medications   Medication Sig Start Date End Date Taking? Authorizing Provider  amiodarone (PACERONE) 200 MG tablet Take 1 tablet (200 mg total) by mouth daily. Patient not taking: Reported on 10/09/2022 09/03/22   Sunnie Nielsen, DO  aspirin 81 MG chewable tablet Chew 1 tablet (81 mg total) by mouth daily. 10/11/22   Alford Highland, MD  atorvastatin (LIPITOR) 80 MG tablet Take 80 mg by mouth every evening.  [provider]  canagliflozin (INVOKANA) 300 MG TABS tablet Take 300 mg by mouth daily before breakfast.    [provider]  diclofenac Sodium (VOLTAREN) 1 % GEL Apply 2 g topically 4 (four) times daily as needed. 09/08/22   Marrion Coy, MD  furosemide (LASIX) 40 MG tablet Take 1 tablet (40 mg total) by mouth daily as needed. for up to 3 days for increased leg swelling, shortness of breath, weight gain 5+ lbs over 1-2 days. Seek medical care if these symptoms are not improving with increased dose. 08/26/22   Sunnie Nielsen, DO  gabapentin (NEURONTIN) 300 MG capsule Take 300 mg by mouth 2 (two) times daily.    [provider]  heparin 16109 UT/250ML infusion Inject 1,600 Units/hr into the vein continuous. 10/10/22   Alford Highland, MD  isosorbide mononitrate (IMDUR) 30 MG 24 hr tablet Take 1 tablet (30 mg total) by mouth daily. Patient not taking: Reported on 10/09/2022 09/08/22   Marrion Coy, MD  levothyroxine (SYNTHROID) 300 MCG tablet Take 300 mcg by mouth daily before breakfast. 05/10/22 05/10/23  [provider]  metoprolol tartrate (LOPRESSOR) 25 MG tablet Take 0.5 tablets (12.5 mg total) by mouth 2  (two) times daily. 10/10/22   Alford Highland, MD  oxyCODONE (OXY IR/ROXICODONE) 5 MG immediate release tablet Take 1 tablet (5 mg total) by mouth every 4 (four) hours as needed for moderate pain. 10/10/22   Alford Highland, MD  pantoprazole (PROTONIX) 20 MG tablet Take 1 tablet (20 mg total) by mouth 2 (two) times daily. 03/09/22 10/08/22  Tresa Moore, MD  potassium chloride SA (KLOR-CON M) 20 MEQ tablet Take 1 tablet (20 mEq total) by mouth daily. 10/11/22   Alford Highland, MD  sucralfate (CARAFATE) 1 GM/10ML suspension Take 1 g by mouth 4 (four) times daily -  with meals and at bedtime.    [provider]     Vitals:   04/26/23 0002 04/26/23 0030 04/26/23 0100 04/26/23 0224  BP: 118/73 116/72 102/67   Pulse: 87 85 82   Resp: 16 16 17    Temp:      TempSrc:      SpO2: 99% 100% 95%   Weight:    121.6 kg   Physical Exam Vitals and nursing note reviewed.  Constitutional:      General: He is not in acute distress.    Appearance: He is obese.  HENT:     Head: Normocephalic and atraumatic.     Right Ear: Hearing normal.     Left Ear: Hearing normal.     Nose: Nose normal. No nasal deformity.     Mouth/Throat:     Lips: Pink.     Mouth: Mucous membranes are dry.     Tongue: No lesions.  Eyes:     General: Lids are normal.     Extraocular Movements: Extraocular movements intact.  Neck:      Comments: Pt has BL neck mess.  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 Left side mass is Neck mass . Pt states he is being followed by [facility] , I don't not see any imaging in past or recent past for neck as he has thyroid cancer it is important that he gets followed up.  Cardiovascular:     Rate and Rhythm: Normal rate and regular rhythm.     Heart sounds: Normal heart sounds.  Pulmonary:  Effort: Pulmonary effort is normal.     Breath sounds: Normal breath sounds.  Abdominal:     General: Bowel  sounds are normal. There is no distension.     Palpations: Abdomen is soft. There is no mass.     Tenderness: There is no abdominal tenderness.  Musculoskeletal:     Right lower leg: No edema.     Left lower leg: No edema.  Skin:    General: Skin is warm.  Neurological:     General: No focal deficit present.     Mental Status: He is alert and oriented to person, place, and time.     Cranial Nerves: Cranial nerves 2-12 are intact.  Psychiatric:        Attention and Perception: Attention normal.        Mood and Affect: Mood normal.        Speech: Speech normal.        Behavior: Behavior normal. Behavior is cooperative.     Labs on Admission: I have personally reviewed following labs and imaging studies Results for orders placed or performed during the hospital encounter of 04/25/23 (from the past 24 hour(s))  CBC with Differential     Status: Abnormal   Collection Time: 04/25/23 10:30 PM  Result Value Ref Range   WBC 10.7 (H) 4.0 - 10.5 K/uL   RBC 4.02 (L) 4.22 - 5.81 MIL/uL   Hemoglobin 11.9 (L) 13.0 - 17.0 g/dL   HCT 31.5 (L) 17.6 - 16.0 %   MCV 88.8 80.0 - 100.0 fL   MCH 29.6 26.0 - 34.0 pg   MCHC 33.3 30.0 - 36.0 g/dL   RDW 73.7 10.6 - 26.9 %   Platelets 213 150 - 400 K/uL   nRBC 0.0 0.0 - 0.2 %   Neutrophils Relative % 67 %   Neutro Abs 7.2 1.7 - 7.7 K/uL   Lymphocytes Relative 19 %   Lymphs Abs 2.0 0.7 - 4.0 K/uL   Monocytes Relative 6 %   Monocytes Absolute 0.7 0.1 - 1.0 K/uL   Eosinophils Relative 7 %   Eosinophils Absolute 0.7 (H) 0.0 - 0.5 K/uL   Basophils Relative 1 %   Basophils Absolute 0.1 0.0 - 0.1 K/uL   Immature Granulocytes 0 %   Abs Immature Granulocytes 0.04 0.00 - 0.07 K/uL  Basic metabolic panel     Status: Abnormal   Collection Time: 04/25/23 10:30 PM  Result Value Ref Range   Sodium 135 135 - 145 mmol/L   Potassium 3.6 3.5 - 5.1 mmol/L   Chloride 102 98 - 111 mmol/L   CO2 22 22 - 32 mmol/L   Glucose, Bld 119 (H) 70 - 99 mg/dL   BUN 20 6 - 20  mg/dL   Creatinine, Ser 4.85 (H) 0.61 - 1.24 mg/dL   Calcium 8.3 (L) 8.9 - 10.3 mg/dL   GFR, Estimated 32 (L) >60 mL/min   Anion gap 11 5 - 15  Troponin I (High Sensitivity)     Status: Abnormal   Collection Time: 04/25/23 10:30 PM  Result Value Ref Range   Troponin I (High Sensitivity) 23 (H) <18 ng/L  TSH     Status: Abnormal   Collection Time: 04/25/23 10:30 PM  Result Value Ref Range   TSH 80.000 (H) 0.350 - 4.500 uIU/mL  T4, free     Status: Abnormal   Collection Time: 04/25/23 10:30 PM  Result Value Ref Range   Free T4 0.41 (L) 0.61 - 1.12  ng/dL  Resp panel by RT-PCR (RSV, Flu A&B, Covid) Anterior Nasal Swab     Status: None   Collection Time: 04/26/23 12:03 AM   Specimen: Anterior Nasal Swab  Result Value Ref Range   SARS Coronavirus 2 by RT PCR NEGATIVE NEGATIVE   Influenza A by PCR NEGATIVE NEGATIVE   Influenza B by PCR NEGATIVE NEGATIVE   Resp Syncytial Virus by PCR NEGATIVE NEGATIVE  Group A Strep by PCR ([facility] Only)     Status: None   Collection Time: 04/26/23 12:03 AM   Specimen: Anterior Nasal Swab; Sterile Swab  Result Value Ref Range   Group A Strep by PCR NOT DETECTED NOT DETECTED  Troponin I (High Sensitivity)     Status: Abnormal   Collection Time: 04/26/23  1:44 AM  Result Value Ref Range   Troponin I (High Sensitivity) 28 (H) <18 ng/L    CBC: Recent Labs  Lab 04/25/23 2230  WBC 10.7*  NEUTROABS 7.2  HGB 11.9*  HCT 35.7*  MCV 88.8  PLT 213   Basic Metabolic Panel: Recent Labs  Lab 04/25/23 2230  NA 135  K 3.6  CL 102  CO2 22  GLUCOSE 119*  BUN 20  CREATININE 2.28*  CALCIUM 8.3*   GFR: CrCl cannot be calculated (Unknown ideal weight.). Liver Function Tests: No results for input(s): "AST", "ALT", "ALKPHOS", "BILITOT", "PROT", "ALBUMIN" in the last 168 hours. No results for input(s): "LIPASE", "AMYLASE" in the last 168 hours. No results for input(s): "AMMONIA" in the last 168 hours. Coagulation Profile: No results for input(s): "INR",  "PROTIME" in the last 168 hours. Cardiac Enzymes: No results for input(s): "CKTOTAL", "CKMB", "CKMBINDEX", "TROPONINI" in the last 168 hours. BNP (last 3 results) No results for input(s): "PROBNP" in the last 8760 hours. HbA1C: No results for input(s): "HGBA1C" in the last 72 hours. CBG: No results for input(s): "GLUCAP" in the last 168 hours. Lipid Profile: No results for input(s): "CHOL", "HDL", "LDLCALC", "TRIG", "CHOLHDL", "LDLDIRECT" in the last 72 hours. Thyroid Function Tests: Recent Labs    04/25/23 2230  TSH 80.000*  FREET4 0.41*   Anemia Panel: No results for input(s): "VITAMINB12", "FOLATE", "FERRITIN", "TIBC", "IRON", "RETICCTPCT" in the last 72 hours. Urinalysis    Component Value Date/Time   COLORURINE YELLOW (A) 09/15/2022 0336   APPEARANCEUR HAZY (A) 09/15/2022 0336   APPEARANCEUR Clear 09/22/2013 2248   LABSPEC 1.027 09/15/2022 0336   LABSPEC >1.060 09/22/2013 2248   PHURINE 6.0 09/15/2022 0336   GLUCOSEU >=500 (A) 09/15/2022 0336   GLUCOSEU Negative 09/22/2013 2248   HGBUR NEGATIVE 09/15/2022 0336   BILIRUBINUR NEGATIVE 09/15/2022 0336   BILIRUBINUR Negative 09/22/2013 2248   KETONESUR NEGATIVE 09/15/2022 0336   PROTEINUR 100 (A) 09/15/2022 0336   NITRITE NEGATIVE 09/15/2022 0336   LEUKOCYTESUR NEGATIVE 09/15/2022 0336   LEUKOCYTESUR Negative 09/22/2013 2248    Unresulted Labs (From admission, onward)     Start     Ordered   04/26/23 0500  Comprehensive metabolic panel  Tomorrow morning,   R        04/26/23 0147   04/26/23 0500  CBC  Tomorrow morning,   R        04/26/23 0147   04/26/23 0500  Occult blood card to lab, stool RN will collect  Daily,   R     Question:  Specimen to be collected by:  Answer:  RN will collect   04/26/23 0148   04/26/23 0147  Procalcitonin  Add-on,   AD  References:    Procalcitonin Lower Respiratory Tract Infection AND Sepsis Procalcitonin Algorithm   04/26/23 0147   04/26/23 0138  HIV Antibody (routine testing w  rflx)  (HIV Antibody (Routine testing w reflex) panel)  Once,   R        04/26/23 0147            Medications  sodium chloride flush (NS) 0.9 % injection 3 mL (has no administration in time range)  0.9 %  sodium chloride infusion (has no administration in time range)  acetaminophen (TYLENOL) tablet 650 mg (has no administration in time range)    Or  acetaminophen (TYLENOL) suppository 650 mg (has no administration in time range)  loratadine (CLARITIN) tablet 10 mg (has no administration in time range)  nitroGLYCERIN (NITROSTAT) SL tablet 0.4 mg (has no administration in time range)  aspirin chewable tablet 81 mg (has no administration in time range)  atorvastatin (LIPITOR) tablet 80 mg (has no administration in time range)  gabapentin (NEURONTIN) capsule 300 mg (has no administration in time range)  metoprolol tartrate (LOPRESSOR) tablet 12.5 mg (has no administration in time range)  levothyroxine (SYNTHROID, LEVOTHROID) injection 100 mcg (has no administration in time range)  amiodarone (PACERONE) tablet 200 mg (has no administration in time range)  sodium chloride 0.9 % bolus 1,000 mL (0 mLs Intravenous Stopped 04/26/23 0045)  guaiFENesin (ROBITUSSIN) 100 MG/5ML liquid 5 mL (5 mLs Oral Given 04/26/23 0014)    Radiological Exams on Admission: DG Chest Portable 1 View  Result Date: 04/25/2023 CLINICAL DATA:  Chest pain EXAM: PORTABLE CHEST 1 VIEW COMPARISON:  10/07/2022 FINDINGS: Prior CABG. Heart and mediastinal contours are within normal limits. No focal opacities or effusions. No acute bony abnormality. IMPRESSION: No active cardiopulmonary disease. Electronically Signed   By: Charlett Nose M.D.   On: 04/25/2023 23:16     Data Reviewed: Relevant notes from primary care and specialist visits, past discharge summaries as available in EHR, including Care Everywhere. Prior diagnostic testing as pertinent to current admission diagnoses Updated medications and problem lists for  reconciliation ED course, including vitals, labs, imaging, treatment and response to treatment Triage notes, nursing and pharmacy notes and ED provider's notes Notable results as noted in HPI  Assessment and Plan: * Phlegm in throat Claritin trial.  Aspiration precaution. Fall precaution.  Pt needs head ct .  Iron deficiency anemia    Latest Ref Rng & Units 04/25/2023   10:30 PM 10/10/2022    4:35 AM 10/09/2022    5:31 PM  CBC  WBC 4.0 - 10.5 K/uL 10.7  11.8    Hemoglobin 13.0 - 17.0 g/dL 27.2  8.5  8.8   Hematocrit 39.0 - 52.0 % 35.7  27.9  26.0   Platelets 150 - 400 K/uL 213  295    Type and screen, IV PPI.  Paroxysmal atrial fibrillation (HCC) Currently in sinus rhythm.  Patient to continue 69 aspirin metoprolol Lopressor  amiodarone 100 and Xarelto.  CAD (coronary artery disease) Currently patient is chest pain-free, denied any chest pain to me.  Will continue patient on aspirin 81 metoprolol 12.5 atorvastatin 80.  Nitroglycerin as needed.  Will follow second troponin if the rise is significant will get cardiology on board.   Uncontrolled type 2 diabetes mellitus with hyperglycemia, without long-term current use of insulin (HCC) Home regimen includes Invokana 300 mg, last A1c was 9.3. Will start patient on glycemic protocol but currently patient NPO.  HTN (hypertension) Vitals:   04/25/23 2230 04/25/23  2300 04/25/23 2330 04/26/23 0002  BP: 120/79 112/77 137/85 118/73   04/26/23 0030 04/26/23 0100  BP: 116/72 102/67  Patient continued on metoprolol 12.5 twice daily.  Invokana held.   Hypothyroidism Patient has not taken his in about 2 to 3 months.  Discussed with pharmacy to start with IV dose as TSH is significantly abnormal and to transition to p.o. in a few days.  Severe obstructive sleep apnea CPAP per home settings.  History of aortic valve replacement Patient is status post [facility]AVR at [facility] in May of this year.  GERD (gastroesophageal reflux disease) IV  ppi.   Parotid mass Pt needs ct scan head and neck with contrast and is followed at [facility] we will get if creatinine goes down otherwise he can follow with [facility].   AKI (acute kidney injury) [facility]) Lab Results  Component Value Date   CREATININE 2.28 (H) 04/25/2023   CREATININE 1.23 10/10/2022   CREATININE 1.18 10/09/2022   Intake/Output Summary (Last 24 hours) at 04/26/2023 0243 Last data filed at 04/26/2023 0045 Gross per 24 hour  Intake 1000 ml  Output --  Net 1000 ml  Cont with ns at 75     Prolonged QT interval Avoid additional QT prolonging meds like Zofran and azithromycin.  Chronic diastolic CHF (congestive heart failure) (HCC) Stable, euvolemic, continue patient on metoprolol 12.5 aspirin 81 and amiodarone.     DVT prophylaxis:  Xarelto   Consults:  None   Advance Care Planning:    Code Status: Full Code   Family Communication:  none  Disposition Plan:  Home.   Severity of Illness: The appropriate patient status for this patient is OBSERVATION. Observation status is judged to be reasonable and necessary in order to provide the required intensity of service to ensure the patient's safety. The patient's presenting symptoms, physical exam findings, and initial radiographic and laboratory data in the context of their medical condition is felt to place them at decreased risk for further clinical deterioration. Furthermore, it is anticipated that the patient will be medically stable for discharge from the hospital within 2 midnights of admission.   Author: Gertha Calkin, MD 04/26/2023 2:50 AM  For on call review [email or web address].

## 2023-04-26 NOTE — Assessment & Plan Note (Signed)
Stable, euvolemic, continue patient on metoprolol 12.5 aspirin 81 and amiodarone.

## 2023-04-26 NOTE — Assessment & Plan Note (Signed)
Home regimen includes Invokana 300 mg, last A1c was 9.3. Will start patient on glycemic protocol but currently patient NPO.

## 2023-04-26 NOTE — Assessment & Plan Note (Signed)
IV ppi.   

## 2023-04-26 NOTE — Assessment & Plan Note (Addendum)
Currently patient is chest pain-free, denied any chest pain to me.  Will continue patient on aspirin 81 metoprolol 12.5 atorvastatin 80.  Nitroglycerin as needed.  Will follow second troponin if the rise is significant will get cardiology on board.

## 2023-04-26 NOTE — Assessment & Plan Note (Signed)
Currently in sinus rhythm.  Patient to continue 62 aspirin metoprolol Lopressor  amiodarone 100 and Xarelto.

## 2023-04-26 NOTE — Assessment & Plan Note (Addendum)
Claritin trial.  Aspiration precaution. Fall precaution.  Pt needs head ct .

## 2023-04-26 NOTE — Assessment & Plan Note (Signed)
Patient has not taken his in about 2 to 3 months.  Discussed with pharmacy to start with IV dose as TSH is significantly abnormal and to transition to p.o. in a few days.

## 2023-04-27 DIAGNOSIS — E039 Hypothyroidism, unspecified: Secondary | ICD-10-CM

## 2023-04-27 DIAGNOSIS — K118 Other diseases of salivary glands: Secondary | ICD-10-CM

## 2023-04-27 LAB — BASIC METABOLIC PANEL
Anion gap: 11 (ref 5–15)
BUN: 16 mg/dL (ref 6–20)
CO2: 22 mmol/L (ref 22–32)
Calcium: 8.1 mg/dL — ABNORMAL LOW (ref 8.9–10.3)
Chloride: 101 mmol/L (ref 98–111)
Creatinine, Ser: 1.64 mg/dL — ABNORMAL HIGH (ref 0.61–1.24)
GFR, Estimated: 48 mL/min — ABNORMAL LOW (ref >60)
Glucose, Bld: 200 mg/dL — ABNORMAL HIGH (ref 70–99)
Potassium: 3.5 mmol/L (ref 3.5–5.1)
Sodium: 134 mmol/L — ABNORMAL LOW (ref 135–145)

## 2023-04-27 LAB — GLUCOSE, CAPILLARY
Glucose-Capillary: 161 mg/dL — ABNORMAL HIGH (ref 70–99)
Glucose-Capillary: 143 mg/dL — ABNORMAL HIGH (ref 70–99)
Glucose-Capillary: 152 mg/dL — ABNORMAL HIGH (ref 70–99)
Glucose-Capillary: 153 mg/dL — ABNORMAL HIGH (ref 70–99)
Glucose-Capillary: 159 mg/dL — ABNORMAL HIGH (ref 70–99)

## 2023-04-27 MED ORDER — ASPIRIN 81 MG PO TBEC
81.0000 mg | DELAYED_RELEASE_TABLET | Freq: Every day | ORAL | Status: DC
  Administered 2023-04-28: 81 mg via ORAL
  Filled 2023-04-27: qty 1

## 2023-04-27 MED ORDER — RIVAROXABAN 20 MG PO TABS
20.0000 mg | ORAL_TABLET | Freq: Every day | ORAL | Status: DC
  Administered 2023-04-27: 20 mg via ORAL
  Filled 2023-04-27: qty 1

## 2023-04-27 MED ORDER — SPIRONOLACTONE 25 MG PO TABS
25.0000 mg | ORAL_TABLET | Freq: Every day | ORAL | Status: DC
  Administered 2023-04-27 – 2023-04-28 (×2): 25 mg via ORAL
  Filled 2023-04-27 (×2): qty 1

## 2023-04-27 NOTE — TOC Initial Note (Signed)
Transition of Care [facility]) - Initial/Assessment Note    Patient Details  Name: Craig Reese MRN: [id number] Date of Birth: Apr 12, 1961  Transition of Care [facility]) CM/SW Contact:    Truddie Hidden, RN Phone Number: 04/27/2023, 3:19 PM  Clinical Narrative:                 Patient is uninsured.        Patient Goals and CMS Choice            Expected Discharge Plan and Services                                              Prior Living Arrangements/Services                       Activities of Daily Living   ADL Screening (condition at time of admission) Independently performs ADLs?: Yes (appropriate for developmental age) Is the patient deaf or have difficulty hearing?: No Does the patient have difficulty seeing, even when wearing glasses/contacts?: No Does the patient have difficulty concentrating, remembering, or making decisions?: No  Permission Sought/Granted                  Emotional Assessment              Admission diagnosis:  Phlegm in throat [R09.89] AKI (acute kidney injury) (HCC) [N17.9] Patient Active Problem List   Diagnosis Date Noted   Phlegm in throat 04/26/2023   Iron deficiency anemia 10/09/2022   GERD (gastroesophageal reflux disease) 10/09/2022   History of aortic valve replacement 10/09/2022   Thoracic aortic aneurysm (HCC) 10/08/2022   History of aortic valve replacement with bioprosthetic valve 09/15/2022   Hx of CABG 09/15/2022   Parotid mass 09/15/2022   Severe aortic valve stenosis 09/15/2022   Dyslipidemia 09/07/2022   Paroxysmal atrial fibrillation (HCC) 09/07/2022   Persistent atrial fibrillation (HCC) 09/07/2022   Acute on chronic diastolic congestive heart failure (HCC) 08/24/2022   Acute on chronic diastolic CHF (congestive heart failure) (HCC) 08/24/2022   History of aortic valve stenosis 08/20/2022   Dyspnea 08/20/2022   Obesity (BMI 30-39.9) 08/20/2022   Severe obstructive sleep  apnea 08/20/2022   Hypotension 08/20/2022   AKI (acute kidney injury) (HCC) 03/07/2022   NSTEMI (non-ST elevated myocardial infarction) (HCC) 03/06/2022   AF (paroxysmal atrial fibrillation) (HCC)    Acquired thrombophilia (HCC)    HLD (hyperlipidemia) 04/07/2020   Elevated troponin 04/07/2020   Abnormal LFTs 04/07/2020   Leukocytosis 04/07/2020   HTN (hypertension) 04/07/2020   Nonspecific chest pain 11/17/2018   Uncontrolled type 2 diabetes mellitus with hyperglycemia, without long-term current use of insulin (HCC) 11/17/2018   Hypothyroidism 11/17/2018   Hypokalemia 11/17/2018   Acute respiratory failure with hypoxia (HCC) 11/17/2018   COVID-19 virus infection 11/17/2018   Hypertensive urgency 11/17/2018   Chronic diastolic CHF (congestive heart failure) (HCC) 11/17/2018   Prolonged QT interval 11/17/2018   Atrial fibrillation with RVR (HCC) 06/06/2018   CAD (coronary artery disease) 05/13/2018   Hx of papillary adenocarcinoma of thyroid 06/10/2017   PCP:  Katrina Stack, MD Pharmacy:   [facility] [place], [place] - [address] [place] [address] Phone: [phone] Fax: [phone]  [facility] - [place], [place] - [address] [place] [address] Phone: [phone] Fax: [phone]  [facility] [address] [place] [address] Phone: [phone] Fax: [phone]     Social Determinants of Health (SDOH) Social History: SDOH Screenings   Food Insecurity: Patient Declined (04/26/2023)  Housing: Patient Declined (04/26/2023)  Transportation Needs: Patient Declined (04/26/2023)  Utilities: Patient Declined (04/26/2023)  Financial Resource Strain: Medium Risk (05/13/2018)  Physical Activity: Unknown (05/13/2018)  Stress: Stress Concern Present (05/13/2018)  Tobacco Use: Low Risk  (04/25/2023)  Health Literacy: High  Risk (04/23/2020)   Received from [facility]   SDOH Interventions:     Readmission Risk Interventions     No data to display

## 2023-04-27 NOTE — Progress Notes (Signed)
SLP Cancellation Note  Patient Details Name: Eliecer Mancine MRN: [id number] DOB: [birth year]   Cancelled treatment:       Reason Eval/Treat Not Completed: Patient at procedure or test/unavailable  Pt currently off unit. Will continue efforts to complete swallow evaluation.  Margrette Wynia B. Murvin Natal, Dr [facility], CCC-SLP Speech Language Pathologist  Leigh Aurora 04/27/2023, 2:46 PM

## 2023-04-27 NOTE — Progress Notes (Signed)
PROGRESS NOTE  Craig Reese    DOB: 02/23/1961, 62 y.o.  ZDG:[id number]    Code Status: Full Code   DOA: 04/25/2023   LOS: 0   Brief hospital course  Craig Reese is a 62 y.o. male with a PMH significant for coronary artery disease with single bypass grafting,LHC 07/2022 with stable CAD no culprit lesion , paroxysmal A-fib on Xarelto and amiodarone, chronic diastolic CHF, type 2 diabetes, hypothyroidism,osa, and right parotid mass followed by [facility] with plans of surgery,OSA,CKDIII.  They presented from home to the ED on 04/25/2023 with phlegm x several days. Has known salivary gland mass and follows with [facility]NT. Also endorses a generalized feeling of unwell  In the ED, it was found that they had stable vitals on room air.  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 Although indeterminate this has indistinct margins, and there is asymmetric regional right cervical lymphadenopathy, both elevating the likelihood of malignancy. 2. Larynx, pharynx and retropharyngeal space remain within normal limits; there is a conspicuous but small left retropharyngeal lymph node. 3. Probable previous thyroidectomy.  Assessment & Plan  Principal Problem:   Phlegm in throat Active Problems:   CAD (coronary artery disease)   Paroxysmal atrial fibrillation (HCC)   Iron deficiency anemia   Uncontrolled type 2 diabetes mellitus with hyperglycemia, without long-term current use of insulin (HCC)   HTN (hypertension)   Hypothyroidism   Severe obstructive sleep apnea   Chronic diastolic CHF (congestive heart failure) (HCC)   Prolonged QT interval   AKI (acute kidney injury) (HCC)   Parotid mass   GERD (gastroesophageal reflux disease)   History of aortic valve replacement  Hypothyroidism s/p thyroidectomy- patient overall feels unwell and sluggish and I think the biggest contribution is the clear  undertreatment of his thyroid disease. He is s/p thyroidectomy from remote thyroid cancer. He endorses taking his thyroid medication daily but admits he did not know he shouldn't take it with his other medications and food so it has been ineffective this whole time. Spent an extensive time discussing this with him with an interpretor and with his niece on the proper way to take his thyroid replacement hormone.  - continue his high dose of thyroid medication as I assume with proper administration he will soon become better controlled and feel much improved at baseline.   Parotid/salivary gland mass- follows with [facility], oncology. Discussed that his symptoms related to this are unlikely to get better until treats the source. His niece is well informed and able to bring him to his follow up appointment scheduled 12/23. He has no difficulty with swallowing his medications or breathing. Provided with signs and symptoms to be aware of for emergent return for evaluation should the mass impede his airway but no evidence of this on imaging either.  - continue to stay well hydrated  - f/u with [facility] as directed  AKI on CKD II - improving today.  - avoid nephrotoxic agents - BMP am - holding losartan  PAF  HTN  h/o aortic valve replacement  CHF  CAD  h/o QT prolongation - continue home medications  - amiodarone  - aspirin  - metoprolol  - xarelto  - spironolactone   Well controlled type II DM- not on insulin. Hgb A1c 6.7 - sSSI  Body mass index is 38.45 kg/m.  VTE ppx: xarelto  Diet:     Diet   Diet  Carb Modified Fluid consistency: Thin; Room service appropriate? Yes   Consultants: None   Subjective 04/27/23    Pt reports feeling generally unwell. Does not describe in detail. States his saliva is thick. Denies difficulty with swallowing it or his food or medications.   Objective   Vitals:   04/26/23 2122 04/26/23 2122 04/27/23 0006 04/27/23 0541  BP:  (!) 151/96 137/69  138/74  Pulse:  67 61 64  Resp:  20 20 18   Temp:  98.5 F (36.9 C) (!) 97.5 F (36.4 C) 97.9 F (36.6 C)  TempSrc:  Oral Oral Oral  SpO2:  97% 96% 97%  Weight:      Height: 5\' 10"  (1.778 m)       Intake/Output Summary (Last 24 hours) at 04/27/2023 0749 Last data filed at 04/26/2023 2128 Gross per 24 hour  Intake 1453.58 ml  Output --  Net 1453.58 ml   Filed Weights   04/26/23 0224  Weight: 121.6 kg    Physical Exam:  General: awake, alert, NAD HEENT: mass near right ear Respiratory: normal respiratory effort. Cardiovascular: quick capillary refill, normal S1/S2, RRR, no JVD, murmurs Gastrointestinal: soft, NT, ND Nervous: A&O x3. no gross focal neurologic deficits, normal speech Extremities: moves all equally, no edema, normal tone Skin: dry, intact, normal temperature, normal color. No rashes, lesions or ulcers on exposed skin Psychiatry: normal mood, congruent affect  Labs   I have personally reviewed the following labs and imaging studies CBC    Component Value Date/Time   WBC 9.5 04/26/2023 0553   RBC 3.74 (L) 04/26/2023 0553   HGB 11.1 (L) 04/26/2023 0553   HGB 11.4 (L) 11/17/2018 0838   HCT 33.9 (L) 04/26/2023 0553   HCT 36.8 (L) 09/22/2013 1657   PLT 192 04/26/2023 0553   PLT 361 09/22/2013 1657   MCV 90.6 04/26/2023 0553   MCV 85 09/22/2013 1657   MCH 29.7 04/26/2023 0553   MCHC 32.7 04/26/2023 0553   RDW 14.7 04/26/2023 0553   RDW 13.8 09/22/2013 1657   LYMPHSABS 2.0 04/25/2023 2230   LYMPHSABS 1.8 08/19/2013 0827   MONOABS 0.7 04/25/2023 2230   MONOABS 0.7 08/19/2013 0827   EOSABS 0.7 (H) 04/25/2023 2230   EOSABS 0.5 08/19/2013 0827   BASOSABS 0.1 04/25/2023 2230   BASOSABS 0.1 08/19/2013 0827      Latest Ref Rng & Units 04/26/2023    5:53 AM 04/25/2023   10:30 PM 10/10/2022    4:35 AM  BMP  Glucose 70 - 99 mg/dL 80  409  811   BUN 6 - 20 mg/dL 20  20  22    Creatinine 0.61 - 1.24 mg/dL 9.14  7.82  9.56   Sodium 135 - 145 mmol/L 136  135   138   Potassium 3.5 - 5.1 mmol/L 3.3  3.6  3.8   Chloride 98 - 111 mmol/L 102  102  104   CO2 22 - 32 mmol/L 22  22  26    Calcium 8.9 - 10.3 mg/dL 8.1  8.3  8.1     CT SOFT TISSUE NECK W CONTRAST  Result Date: 04/26/2023 CLINICAL DATA:  62 year old male with hypertension, chest pain, thyroid disease. Globus sensation. EXAM: CT NECK WITH CONTRAST TECHNIQUE: Multidetector CT imaging of the neck was performed using the standard protocol following the bolus administration of intravenous contrast. RADIATION DOSE REDUCTION: This exam was performed according to the departmental dose-optimization program which includes automated exposure control, adjustment of the mA and/or kV according  to patient size and/or use of iterative reconstruction technique. CONTRAST:  60mL OMNIPAQUE IOHEXOL 300 MG/ML  SOLN COMPARISON:  Chest CT 10/08/2022.  Head CT 09/15/2022. FINDINGS: Pharynx and larynx: Postinflammatory calcifications of the tonsils. Small retropharyngeal vessels and 7 mm left side retropharyngeal lymph node on series 2, image 43. But no masslike laryngeal or pharyngeal soft tissue, no hyperenhancement. Parapharyngeal spaces appear normal. Salivary glands: Negative sublingual space. Negative submandibular spaces. Round, lobulated but also indistinct right parotid gland mass, about 33 mm diameter in the superficial lobe. This was partially visible in April, was not apparent on a 2014 head CT. Right stylomastoid foramen soft tissues appear to remain normal. No surrounding inflammation. Contralateral left parotid gland is negative. Thyroid: Thyroid region surgical clips, suspect previous thyroidectomy. Lymph nodes: Asymmetric cervical lymphadenopathy on the right. Right level 2A lymph nodes are up to 14 mm short axis on series 2, image 46. Increased size and number of left level 2 and level 3 nodes. No cystic or necrotic nodes. Small left retropharyngeal lymph node on series 2, image 43 as stated above. Left neck nodal  stations, bilateral level 4 lymph nodes are within normal limits. Vascular: Calcified carotid atherosclerosis at the bifurcations. Dominant left vertebral artery. Major vascular structures in the neck and at the skull base remain patent. Limited intracranial: Negative. Visualized orbits: Negative. Mastoids and visualized paranasal sinuses: Tympanic cavities and mastoids are clear. Mild paranasal sinus mucosal thickening. No visible sinus fluid level. Skeleton: Occasional dental caries, especially left maxillary posterior molar. Bulky cervical spine facet and endplate degenerative spurring. Previous sternotomy. No acute or suspicious osseous lesion. Upper chest: Calcified aortic atherosclerosis. No superior mediastinal lymphadenopathy. Upper lung atelectasis. IMPRESSION: 1. 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 Although indeterminate this has indistinct margins, and there is asymmetric regional right cervical lymphadenopathy, both elevating the likelihood of malignancy. 2. Larynx, pharynx and retropharyngeal space remain within normal limits; there is a conspicuous but small left retropharyngeal lymph node. 3. Probable previous thyroidectomy. 4. Aortic Atherosclerosis (ICD10-I70.0).  Carotid atherosclerosis. Electronically Signed   By: Odessa Fleming M.D.   On: 04/26/2023 12:50   DG Chest Portable 1 View  Result Date: 04/25/2023 CLINICAL DATA:  Chest pain EXAM: PORTABLE CHEST 1 VIEW COMPARISON:  10/07/2022 FINDINGS: Prior CABG. Heart and mediastinal contours are within normal limits. No focal opacities or effusions. No acute bony abnormality. IMPRESSION: No active cardiopulmonary disease. Electronically Signed   By: Charlett Nose M.D.   On: 04/25/2023 23:16    Disposition Plan & Communication  Patient status: Observation  Admitted From: Home Planned disposition location: Home Anticipated discharge date: 12/10 pending AKI resolution   Family Communication: niece on phone    Author: Leeroy Bock, DO [facility] 04/27/2023, 7:49 AM   Available by Epic secure chat 7AM-7PM. If 7PM-7AM, please contact night-coverage.  [facility] contact information found on [email or web address].

## 2023-04-27 NOTE — Plan of Care (Signed)
  Problem: Education: Goal: Ability to describe self-care measures that may prevent or decrease complications (Diabetes Survival Skills Education) will improve Outcome: Progressing Goal: Individualized Educational Video(s) Outcome: Progressing   Problem: Coping: Goal: Ability to adjust to condition or change in health will improve Outcome: Progressing   

## 2023-04-28 ENCOUNTER — Other Ambulatory Visit: Payer: Self-pay

## 2023-04-28 DIAGNOSIS — D490 Neoplasm of unspecified behavior of digestive system: Secondary | ICD-10-CM

## 2023-04-28 DIAGNOSIS — N179 Acute kidney failure, unspecified: Secondary | ICD-10-CM

## 2023-04-28 LAB — BASIC METABOLIC PANEL
Anion gap: 7 (ref 5–15)
BUN: 19 mg/dL (ref 6–20)
CO2: 26 mmol/L (ref 22–32)
Calcium: 8.3 mg/dL — ABNORMAL LOW (ref 8.9–10.3)
Chloride: 102 mmol/L (ref 98–111)
Creatinine, Ser: 1.45 mg/dL — ABNORMAL HIGH (ref 0.61–1.24)
GFR, Estimated: 55 mL/min — ABNORMAL LOW (ref >60)
Glucose, Bld: 120 mg/dL — ABNORMAL HIGH (ref 70–99)
Potassium: 3.7 mmol/L (ref 3.5–5.1)
Sodium: 135 mmol/L (ref 135–145)

## 2023-04-28 LAB — GLUCOSE, CAPILLARY: Glucose-Capillary: 192 mg/dL — ABNORMAL HIGH (ref 70–99)

## 2023-04-28 MED ORDER — LEVOTHYROXINE SODIUM 150 MCG PO TABS
300.0000 ug | ORAL_TABLET | Freq: Every day | ORAL | 0 refills | Status: AC
  Filled 2023-04-28 (×2): qty 60, 30d supply, fill #0

## 2023-04-28 MED ORDER — METOPROLOL TARTRATE 25 MG PO TABS
12.5000 mg | ORAL_TABLET | Freq: Two times a day (BID) | ORAL | 0 refills | Status: AC
  Filled 2023-04-28: qty 30, 30d supply, fill #0

## 2023-04-28 MED ORDER — ASPIRIN 81 MG PO TBEC
81.0000 mg | DELAYED_RELEASE_TABLET | Freq: Every day | ORAL | 12 refills | Status: AC
  Filled 2023-04-28: qty 30, 30d supply, fill #0

## 2023-04-28 NOTE — Discharge Instructions (Signed)
Toma sus medicamentos que su puede mirar en la lista aqui.  Para su tiroides, su medicamento es Industrial/product designer. Va a una cita en una mes con su medico primario para un examen de sangre. Ello puede cambiar la dosis en el futuro.  [place] calor para su dolor de cuello y acetaminophen (tylenol).

## 2023-04-28 NOTE — Evaluation (Signed)
Clinical/Bedside Swallow Evaluation Patient Details  Name: Craig Reese MRN: [id number] Date of Birth: 03/30/1961  Today's Date: 04/28/2023 Time: SLP Start Time (ACUTE ONLY): 0850 SLP Stop Time (ACUTE ONLY): 1000 SLP Time Calculation (min) (ACUTE ONLY): 70 min  Past Medical History:  Past Medical History:  Diagnosis Date   Aortic valve endocarditis    a. 2015 s/p bioprosthetic AVR.   Chronic chest pain    Chronic heart failure with preserved ejection fraction (HFpEF) (HCC)    a. 08/2022 Echo: Ef 55-60%, no rwma, GrII DD, nl RV fxn, sev dil LA, mod dil RA, mild MR, mod AS (mean grad 29.58mmHg, AVA 1.24cm^2).   CKD (chronic kidney disease), stage III (HCC)    Coronary artery disease    a. 2015 s/p CABG x 1 (VG->OM); b. 04/2020 PET CT: apical, apical inf, mid inf, and basal inf ischemia. EF 41%; c. 05/2020 Cath: Sev OM4 dzs, patent graft, mod diff dzs; d. 07/2022 Cath [facility]): LM nl, LAD 40-50, LCX nl, OM4 80, RCA 50-60p, VG->OM 50p->Med rx.   Diabetes mellitus type II, non insulin dependent (HCC) 11/17/2018   Elevated troponin (chronic)    Essential hypertension    Hyperlipidemia LDL goal <70    Hypothyroidism 11/17/2018   Normocytic anemia    OSA (obstructive sleep apnea)    a. not on CPAP.   Persistent atrial fibrillation (HCC)    a. CHA2DS2VASc = 4-->xarelto/amio.   RBBB    Thoracic aortic aneurysm (HCC)    a. 08/2022 CT - 4.1cm.   Past Surgical History:  Past Surgical History:  Procedure Laterality Date   CORONARY ARTERY BYPASS GRAFT     LEFT HEART CATH AND CORS/GRAFTS ANGIOGRAPHY N/A 05/17/2018   Procedure: LEFT HEART CATH AND CORS/GRAFTS ANGIOGRAPHY and possible PCI and stent;  Surgeon: Alwyn Pea, MD;  Location: [facility] INVASIVE CV LAB;  Service: Cardiovascular;  Laterality: N/A;   RIGHT/LEFT HEART CATH AND CORONARY/GRAFT ANGIOGRAPHY N/A 10/09/2022   Procedure: RIGHT/LEFT HEART CATH AND CORONARY/GRAFT ANGIOGRAPHY;  Surgeon: Marykay Lex, MD;  Location:  [facility] INVASIVE CV LAB;  Service: Cardiovascular;  Laterality: N/A;   HPI:  Pt is a 62 y.o. male with medical history significant for Obesity(BMI 38.45), coronary artery disease with single bypass grafting, OSA, LHC 07/2022 with stable CAD no culprit lesion, paroxysmal A-fib on Xarelto and amiodarone, chronic diastolic CHF, type 2 diabetes, hypothyroidism,osa, and Right parotid Mass followed by [facility] with plans of surgery this month, OSA, CKDIII, Left Neck Mass w/ surgery and XRT in 2025 per pt, who presented ED with complaint of central chest pain and Phlegm.  He was hypertensive and EMS was given and check Nitropaste.  Report patient has Not been taking his thyroid medication. Chart review shows that patient had aortic valve replacement.   CXR: no active disease.    Assessment / Plan / Recommendation  Clinical Impression   Pt seen for BSE this morning. pt awake, verbal and followed all instructoins. Pt spoke in full conversation re: his medical issues and concerns. Speech was slightly muttered; 100% intelligible to the Interpreter. A Spanish Interpreter was utilized: [place], [id number].  Pt's main c/o are of phlegm, dryness in his throat impacting his swallowing "sometimes, but not every day". He continues to eat full meals and has Not had weight loss. Unsure if pt is experiencing late effects from Baseline H&N Ca w/ XRT in 2015. On RA, afebrile. WBC WNL.    Pt appears to present w/ functional oropharyngeal phase swallowing w/  No oropharyngeal phase dysphagia noted, No sensorimotor deficits noted during trials. Pt consumed po trials w/ No overt, clinical s/s of aspiration during po trials.  Pt appears at reduced risk for aspiration following general aspiration precautions. However, pt does have challenging factors that could impact oropharyngeal swallowing to include dryness/discomfort when swallowing ("sometimes"), tightness, and Phlegm. These factors can increase risk for dysphagia as well as decreased  oral intake overall.  OF NOTE: Pt denied any REFLUX issues but noted Belching/Hiccup and dry/hack cough x2 post meal. REFLUX issues can increase Esophageal Phlegm.  During po trials, pt consumed all consistencies w/ no overt coughing, decline in vocal quality, or change in respiratory presentation during/post trials. O2 sats 99%. Oral phase appeared [facility] w/ timely bolus management, mastication, and control of bolus propulsion for A-P transfer for swallowing. Oral clearing achieved w/ all trial consistencies -- moistened, soft foods given. Encouraged Smaller bites for ease of swallowing(vs large ones).  OM Exam appeared [facility] w/ no unilateral weakness noted. Speech Clear. Pt fed self w/ setup support.   Recommend continue a fairly Regular consistency diet w/ well-Cut meats, moistened foods; Thin liquids -- alternate foods/liquids to aid clearing, moisture when swallowing. Recommend general aspiration precautions, REFLUX precautions. Drink more water for hydration and to combat Phlegm issues. Pills 1-2 at a time w/ Water.  Education given on Pills in Puree; food consistencies and easy to eat options; general aspiration and REFLUX precautions to pt. Also discussed late effects of XRT to the neck, possibly his swallowing. Encouraged him to f/u w/ his Oncologist, ENT for further discussion and needs moving forward.  No further ST services indicated currently; possibly recommendation of f/u post any H&N surgery for the R parotid mass(?). MD/NSG to reconsult if any new needs arise while admitted. NSG updated, agreed. MD updated. Recommend Dietician f/u for support. SLP Visit Diagnosis: Dysphagia, unspecified (R13.10) (unsure if any Baseline REFLUX activity)    Aspiration Risk   (reduced followig general precs.)    Diet Recommendation   Thin;Age appropriate regular (moist, cut foods) = a fairly Regular consistency diet w/ well-Cut meats, moistened foods; Thin liquids -- alternate foods/liquids to aid clearing,  moisture when swallowing. Recommend general aspiration precautions, REFLUX precautions. Drink more water for hydration and to combat Phlegm issues.  Medication Administration: Whole meds with liquid (1-2 at a time)    Other  Recommendations Recommended Consults: Consider ENT evaluation (f/u ongoing) Oral Care Recommendations: Oral care BID;Patient independent with oral care    Recommendations for follow up therapy are one component of a multi-disciplinary discharge planning process, led by the attending physician.  Recommendations may be updated based on patient status, additional functional criteria and insurance authorization.  Follow up Recommendations No SLP follow up      Assistance Recommended at Discharge  PRN  Functional Status Assessment Patient has not had a recent decline in their functional status  Frequency and Duration  (n/a)   (n/a)       Prognosis Prognosis for improved oropharyngeal function: Good Barriers to Reach Goals: Time post onset;Severity of deficits Barriers/Prognosis Comment: baseline H&N Ca w/ XRT 2015 - late effects?      Swallow Study   General Date of Onset: 04/26/23 HPI: Pt is a 62 y.o. male with medical history significant for Obesity(BMI 38.45), coronary artery disease with single bypass grafting, OSA, LHC 07/2022 with stable CAD no culprit lesion, paroxysmal A-fib on Xarelto and amiodarone, chronic diastolic CHF, type 2 diabetes, hypothyroidism,osa, and Right parotid Mass followed by  [facility] ENT with plans of surgery this month, OSA, CKDIII, Left Neck Mass w/ surgery and XRT in 2025 per pt, who presented ED with complaint of central chest pain and Phlegm.  He was hypertensive and EMS was given and check Nitropaste.  Report patient has Not been taking his thyroid medication. Chart review shows that patient had aortic valve replacement.   CXR: no active disease. Type of Study: Bedside Swallow Evaluation Previous Swallow Assessment: at [facility] in 2015 per pt -  eating a regular diet Diet Prior to this Study: Regular;Thin liquids (Level 0) Temperature Spikes Noted: No (wbc 9.5) Respiratory Status: Room air History of Recent Intubation: No Behavior/Cognition: Alert;Cooperative;Pleasant mood Oral Cavity Assessment: Within Functional Limits Oral Care Completed by SLP: Yes Oral Cavity - Dentition: Adequate natural dentition Vision: Functional for self-feeding Self-Feeding Abilities: Able to feed self Patient Positioning: Upright in bed (needed encouragement for more upright sitting fpr speech and swallowing) Baseline Vocal Quality: Normal Volitional Cough: Strong Volitional Swallow: Able to elicit    Oral/Motor/Sensory Function Overall Oral Motor/Sensory Function: Within functional limits   Ice Chips Ice chips: Within functional limits Presentation: Spoon (fed; 2 trials)   Thin Liquid Thin Liquid: Within functional limits Presentation: Straw;Self Fed (~6-7 ozs total)    Nectar Thick Nectar Thick Liquid: Not tested   Honey Thick Honey Thick Liquid: Not tested   Puree Puree: Within functional limits Presentation: Self Fed;Spoon (8+ trials)   Solid     Solid: Within functional limits Presentation: Self Fed (8+ trials)         Jerilynn Som, MS, CCC-SLP Speech Language Pathologist Rehab Services; [facility] [phone] (ascom) Jerni Selmer 04/28/2023,3:05 PM

## 2023-05-01 NOTE — Discharge Summary (Signed)
Physician Discharge Summary  Patient: Craig Reese ZOX:[id number] DOB: September 04, 1960   Code Status: Prior Admit date: 04/25/2023 Discharge date: 04/19/2023 Disposition: Home, No home health services recommended PCP: Katrina Stack, MD  Recommendations for Outpatient Follow-up:  Follow up with PCP within 1-2 weeks Regarding general hospital follow up and preventative care Recommend rechecking TSH and renal function Follow up as scheduled with [facility] ENT for salivary.parotid gland tumor.   Discharge Diagnoses:  Principal Problem:   Phlegm in throat Active Problems:   CAD (coronary artery disease)   Paroxysmal atrial fibrillation (HCC)   Iron deficiency anemia   Uncontrolled type 2 diabetes mellitus with hyperglycemia, without long-term current use of insulin (HCC)   HTN (hypertension)   Hypothyroidism   Severe obstructive sleep apnea   Chronic diastolic CHF (congestive heart failure) (HCC)   Prolonged QT interval   AKI (acute kidney injury) (HCC)   Parotid mass   GERD (gastroesophageal reflux disease)   History of aortic valve replacement  Brief Hospital Course Summary: Craig Reese is a 62 y.o. male with a PMH significant for coronary artery disease with single bypass grafting,LHC 07/2022 with stable CAD no culprit lesion , paroxysmal A-fib on Xarelto and amiodarone, chronic diastolic CHF, type 2 diabetes, hypothyroidism,osa, and right parotid mass followed by [facility] with plans of surgery,OSA,CKDIII.   They presented from home to the ED on 04/25/2023 with phlegm x several days. Has known salivary gland mass and follows with [facility] ENT. Also endorses a generalized feeling of unwell   In the ED, it was found that they had stable vitals on room air.  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 Although indeterminate this has indistinct margins, and there is asymmetric regional  right cervical lymphadenopathy, both elevating the likelihood of malignancy. 2. Larynx, pharynx and retropharyngeal space remain within normal limits; there is a conspicuous but small left retropharyngeal lymph node.  He was admitted for his AKI which improved with routine care. Likely caused by poor PO intake related to the salivary gland and excessive mucous produced. SLP evaluated and he had no difficulty with swallowing. Tolerated a normal diet throughout his stay. Encouraged to increase his hydration and discharged with a scheduled follow up with [facility]NT on Dec 23rd.   Of note, his TSH was greatly elevated despite being on a high dose of levothyroxine post-op from remote thyroidectomy. After further interview, patient was not properly taking the medication. The recommended timing of taking his thyroid medication was described to him and his niece who assists with his care and both were able to teach back the proper method.  He was discharged with his home levothyroxine dose and instructed to have his TSH rechecked in at PCP in a few weeks since his dose will likely need to be decreased after her has been stabilized on it.   Discharge Condition: Good, improved Recommended discharge diet: Regular healthy diet  Consultations: None   Procedures/Studies: None   Allergies as of 04/28/2023   No Known Allergies      Medication List     STOP taking these medications    aspirin 81 MG chewable tablet Replaced by: aspirin EC 81 MG tablet   heparin 81191 UT/250ML infusion   Invokana 300 MG Tabs tablet Generic drug: canagliflozin       TAKE these medications    amiodarone 200 MG tablet Commonly known as: Pacerone Take  1 tablet (200 mg total) by mouth daily.   aspirin EC 81 MG tablet Tome 1 tableta (81 mg en total) por va oral al da. [place] entero. (Take 1 tablet (81 mg total) by mouth daily. Swallow whole.) Replaces: aspirin 81 MG chewable tablet   atorvastatin 80  MG tablet Commonly known as: LIPITOR Take 80 mg by mouth every evening.   diclofenac Sodium 1 % Gel Commonly known as: VOLTAREN Apply 2 g topically 4 (four) times daily as needed.   furosemide 40 MG tablet Commonly known as: LASIX Take 1 tablet (40 mg total) by mouth daily as needed. for up to 3 days for increased leg swelling, shortness of breath, weight gain 5+ lbs over 1-2 days. Seek medical care if these symptoms are not improving with increased dose.   gabapentin 300 MG capsule Commonly known as: NEURONTIN Take 300 mg by mouth 2 (two) times daily.   glimepiride 2 MG tablet Commonly known as: AMARYL Take 1 tablet by mouth daily.   levothyroxine 150 MCG tablet Commonly known as: SYNTHROID Tome 2 tabletas (300 mcg en total) por va oral diariamente antes de desayunar. (Take 2 tablets (300 mcg total) by mouth daily before breakfast.) What changed:  medication strength how much to take   metFORMIN 1000 MG tablet Commonly known as: GLUCOPHAGE Take 1,000 mg by mouth 2 (two) times daily with a meal.   metoprolol tartrate 25 MG tablet Commonly known as: LOPRESSOR Tome 0,5 comprimidos (12,5 mg en total) por va oral 2 (dos) veces al [employer]. (Take 0.5 tablets (12.5 mg total) by mouth 2 (two) times daily.)   nitroGLYCERIN 0.4 MG SL tablet Commonly known as: NITROSTAT Place 0.4 mg under the tongue every 5 (five) minutes as needed for chest pain.   pantoprazole 40 MG tablet Commonly known as: PROTONIX Take 40 mg by mouth 2 (two) times daily.   potassium chloride SA 20 MEQ tablet Commonly known as: KLOR-CON M Take 1 tablet (20 mEq total) by mouth daily.   rivaroxaban 20 MG Tabs tablet Commonly known as: XARELTO Take 1 tablet by mouth at bedtime.   spironolactone 25 MG tablet Commonly known as: ALDACTONE Take 1 tablet by mouth daily.   sucralfate 1 GM/10ML suspension Commonly known as: CARAFATE Take 1 g by mouth 4 (four) times daily -  with meals and at bedtime.    valsartan 160 MG tablet Commonly known as: DIOVAN Take 1 tablet by mouth daily.        Follow-up Information     Katrina Stack, MD. Schedule an appointment as soon as possible for a visit in 1 month(s).   Specialty: Internal Medicine Contact information: [address] [employer] [facility] at [facility] [place] [address] [phone]         ENT. Go to.   Why: december 23rd appointment with ENT               Subjective   Craig Reese reports feeling improved. He still has excessive phlegm but denies any difficulty with swallowing or breathing. He has some back pain which is improved with heat  All questions and concerns were addressed at time of discharge.  Objective  Blood pressure (!) 150/94, pulse 73, temperature 98.2 F (36.8 C), resp. rate 17, height 5\' 10"  (1.778 m), weight 121.6 kg, SpO2 98%.   General: Craig Reese is alert, awake, not in acute distress HEENT: positive soft, tender mass on left cheek Cardiovascular: RRR, S1/S2 +, no rubs, no gallops  Respiratory: CTA bilaterally, no wheezing, no rhonchi Abdominal: Soft, NT, ND, bowel sounds + Extremities: no edema, no cyanosis  The results of significant diagnostics from this hospitalization (including imaging, microbiology, ancillary and laboratory) are listed below for reference.   Imaging studies: CT SOFT TISSUE NECK W CONTRAST Result Date: 04/26/2023 CLINICAL DATA:  62 year old male with hypertension, chest pain, thyroid disease. Globus sensation. EXAM: CT NECK WITH CONTRAST TECHNIQUE: Multidetector CT imaging of the neck was performed using the standard protocol following the bolus administration of intravenous contrast. RADIATION DOSE REDUCTION: This exam was performed according to the departmental dose-optimization program which includes automated exposure control, adjustment of the mA and/or kV according to patient size and/or use of iterative reconstruction technique. CONTRAST:  60mL OMNIPAQUE  IOHEXOL 300 MG/ML  SOLN COMPARISON:  Chest CT 10/08/2022.  Head CT 09/15/2022. FINDINGS: Pharynx and larynx: Postinflammatory calcifications of the tonsils. Small retropharyngeal vessels and 7 mm left side retropharyngeal lymph node on series 2, image 43. But no masslike laryngeal or pharyngeal soft tissue, no hyperenhancement. Parapharyngeal spaces appear normal. Salivary glands: Negative sublingual space. Negative submandibular spaces. Round, lobulated but also indistinct right parotid gland mass, about 33 mm diameter in the superficial lobe. This was partially visible in April, was not apparent on a 2014 head CT. Right stylomastoid foramen soft tissues appear to remain normal. No surrounding inflammation. Contralateral left parotid gland is negative. Thyroid: Thyroid region surgical clips, suspect previous thyroidectomy. Lymph nodes: Asymmetric cervical lymphadenopathy on the right. Right level 2A lymph nodes are up to 14 mm short axis on series 2, image 46. Increased size and number of left level 2 and level 3 nodes. No cystic or necrotic nodes. Small left retropharyngeal lymph node on series 2, image 43 as stated above. Left neck nodal stations, bilateral level 4 lymph nodes are within normal limits. Vascular: Calcified carotid atherosclerosis at the bifurcations. Dominant left vertebral artery. Major vascular structures in the neck and at the skull base remain patent. Limited intracranial: Negative. Visualized orbits: Negative. Mastoids and visualized paranasal sinuses: Tympanic cavities and mastoids are clear. Mild paranasal sinus mucosal thickening. No visible sinus fluid level. Skeleton: Occasional dental caries, especially left maxillary posterior molar. Bulky cervical spine facet and endplate degenerative spurring. Previous sternotomy. No acute or suspicious osseous lesion. Upper chest: Calcified aortic atherosclerosis. No superior mediastinal lymphadenopathy. Upper lung atelectasis. IMPRESSION: 1.  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 Although indeterminate this has indistinct margins, and there is asymmetric regional right cervical lymphadenopathy, both elevating the likelihood of malignancy. 2. Larynx, pharynx and retropharyngeal space remain within normal limits; there is a conspicuous but small left retropharyngeal lymph node. 3. Probable previous thyroidectomy. 4. Aortic Atherosclerosis (ICD10-I70.0).  Carotid atherosclerosis. Electronically Signed   By: Odessa Fleming M.D.   On: 04/26/2023 12:50   DG Chest Portable 1 View Result Date: 04/25/2023 CLINICAL DATA:  Chest pain EXAM: PORTABLE CHEST 1 VIEW COMPARISON:  10/07/2022 FINDINGS: Prior CABG. Heart and mediastinal contours are within normal limits. No focal opacities or effusions. No acute bony abnormality. IMPRESSION: No active cardiopulmonary disease. Electronically Signed   By: Charlett Nose M.D.   On: 04/25/2023 23:16    Labs: Basic Metabolic Panel: Recent Labs  Lab 04/25/23 2230 04/26/23 0553 04/27/23 1051 04/28/23 0445  NA 135 136 134* 135  K 3.6 3.3* 3.5 3.7  CL 102 102 101 102  CO2 22 22 22 26   GLUCOSE 119* 80 200* 120*  BUN 20 20 16 19   CREATININE 2.28*  2.03* 1.64* 1.45*  CALCIUM 8.3* 8.1* 8.1* 8.3*   CBC: Recent Labs  Lab 04/25/23 2230 04/26/23 0553  WBC 10.7* 9.5  NEUTROABS 7.2  --   HGB 11.9* 11.1*  HCT 35.7* 33.9*  MCV 88.8 90.6  PLT 213 192   Microbiology: Results for orders placed or performed during the hospital encounter of 04/25/23  Resp panel by RT-PCR (RSV, Flu A&B, Covid) Anterior Nasal Swab     Status: None   Collection Time: 04/26/23 12:03 AM   Specimen: Anterior Nasal Swab  Result Value Ref Range Status   SARS Coronavirus 2 by RT PCR NEGATIVE NEGATIVE Final    Comment: (NOTE) SARS-CoV-2 target nucleic acids are NOT DETECTED.  The SARS-CoV-2 RNA is generally detectable in upper respiratory specimens during the acute phase of infection. The lowest concentration of SARS-CoV-2  viral copies this assay can detect is 138 copies/mL. A negative result does not preclude SARS-Cov-2 infection and should not be used as the sole basis for treatment or other patient management decisions. A negative result may occur with  improper specimen collection/handling, submission of specimen other than nasopharyngeal swab, presence of viral mutation(s) within the areas targeted by this assay, and inadequate number of viral copies(<138 copies/mL). A negative result must be combined with clinical observations, patient history, and epidemiological information. The expected result is Negative.  Fact Sheet for Patients:  BloggerCourse.com  Fact Sheet for Healthcare Providers:  SeriousBroker.it  This test is no t yet approved or cleared by the [place] FDA and  has been authorized for detection and/or diagnosis of SARS-CoV-2 by FDA under an Emergency Use Authorization (EUA). This EUA will remain  in effect (meaning this test can be used) for the duration of the COVID-19 declaration under Section 564(b)(1) of the Act, 21 U.S.C.section 360bbb-3(b)(1), unless the authorization is terminated  or revoked sooner.       Influenza A by PCR NEGATIVE NEGATIVE Final   Influenza B by PCR NEGATIVE NEGATIVE Final    Comment: (NOTE) The Xpert Xpress SARS-CoV-2/FLU/RSV plus assay is intended as an aid in the diagnosis of influenza from Nasopharyngeal swab specimens and should not be used as a sole basis for treatment. Nasal washings and aspirates are unacceptable for Xpert Xpress SARS-CoV-2/FLU/RSV testing.  Fact Sheet for Patients: BloggerCourse.com  Fact Sheet for Healthcare Providers: SeriousBroker.it  This test is not yet approved or cleared by the [place] FDA and has been authorized for detection and/or diagnosis of SARS-CoV-2 by FDA under an Emergency Use Authorization  (EUA). This EUA will remain in effect (meaning this test can be used) for the duration of the COVID-19 declaration under Section 564(b)(1) of the Act, 21 U.S.C. section 360bbb-3(b)(1), unless the authorization is terminated or revoked.     Resp Syncytial Virus by PCR NEGATIVE NEGATIVE Final    Comment: (NOTE) Fact Sheet for Patients: BloggerCourse.com  Fact Sheet for Healthcare Providers: SeriousBroker.it  This test is not yet approved or cleared by the [place] FDA and has been authorized for detection and/or diagnosis of SARS-CoV-2 by FDA under an Emergency Use Authorization (EUA). This EUA will remain in effect (meaning this test can be used) for the duration of the COVID-19 declaration under Section 564(b)(1) of the Act, 21 U.S.C. section 360bbb-3(b)(1), unless the authorization is terminated or revoked.  Performed at [facility], [address], [place], [place] [address]   Group A Strep by PCR [facility] Only)     Status: None   Collection Time: 04/26/23 12:03  AM   Specimen: Anterior Nasal Swab; Sterile Swab  Result Value Ref Range Status   Group A Strep by PCR NOT DETECTED NOT DETECTED Final    Comment: Performed at [facility] [address] [place], [place] [address]  Group A Strep by PCR     Status: None   Collection Time: 04/26/23 11:30 AM  Result Value Ref Range Status   Group A Strep by PCR NOT DETECTED NOT DETECTED Final    Comment: Performed at [facility], [address], [place], [place] [address]   Time coordinating discharge: Over 30 minutes  Leeroy Bock, MD  [facility] 05/01/2023, 5:53 PM

## 2023-05-04 ENCOUNTER — Other Ambulatory Visit (HOSPITAL_COMMUNITY): Payer: Self-pay

## 2023-07-04 ENCOUNTER — Emergency Department: Payer: Self-pay

## 2023-07-04 ENCOUNTER — Inpatient Hospital Stay
Admission: EM | Admit: 2023-07-04 | Discharge: 2023-07-06 | DRG: 313 | Disposition: A | Discharge status: Home / Self Care | Payer: Self-pay | Attending: Internal Medicine | Admitting: Internal Medicine

## 2023-07-04 ENCOUNTER — Encounter: Payer: Self-pay | Admitting: Emergency Medicine

## 2023-07-04 ENCOUNTER — Other Ambulatory Visit: Payer: Self-pay

## 2023-07-04 DIAGNOSIS — I4510 Unspecified right bundle-branch block: Secondary | ICD-10-CM | POA: Diagnosis present

## 2023-07-04 DIAGNOSIS — I472 Ventricular tachycardia, unspecified: Secondary | ICD-10-CM | POA: Diagnosis present

## 2023-07-04 DIAGNOSIS — E663 Overweight: Secondary | ICD-10-CM | POA: Diagnosis present

## 2023-07-04 DIAGNOSIS — R079 Chest pain, unspecified: Principal | ICD-10-CM | POA: Diagnosis present

## 2023-07-04 DIAGNOSIS — I2510 Atherosclerotic heart disease of native coronary artery without angina pectoris: Secondary | ICD-10-CM | POA: Diagnosis present

## 2023-07-04 DIAGNOSIS — R590 Localized enlarged lymph nodes: Secondary | ICD-10-CM | POA: Diagnosis present

## 2023-07-04 DIAGNOSIS — Z951 Presence of aortocoronary bypass graft: Secondary | ICD-10-CM

## 2023-07-04 DIAGNOSIS — G4733 Obstructive sleep apnea (adult) (pediatric): Secondary | ICD-10-CM | POA: Diagnosis present

## 2023-07-04 DIAGNOSIS — Z8249 Family history of ischemic heart disease and other diseases of the circulatory system: Secondary | ICD-10-CM

## 2023-07-04 DIAGNOSIS — R072 Precordial pain: Principal | ICD-10-CM | POA: Diagnosis present

## 2023-07-04 DIAGNOSIS — Z7982 Long term (current) use of aspirin: Secondary | ICD-10-CM

## 2023-07-04 DIAGNOSIS — Z79890 Hormone replacement therapy (postmenopausal): Secondary | ICD-10-CM

## 2023-07-04 DIAGNOSIS — Z7901 Long term (current) use of anticoagulants: Secondary | ICD-10-CM

## 2023-07-04 DIAGNOSIS — I130 Hypertensive heart and chronic kidney disease with heart failure and stage 1 through stage 4 chronic kidney disease, or unspecified chronic kidney disease: Secondary | ICD-10-CM | POA: Diagnosis present

## 2023-07-04 DIAGNOSIS — R0789 Other chest pain: Secondary | ICD-10-CM | POA: Diagnosis present

## 2023-07-04 DIAGNOSIS — I5032 Chronic diastolic (congestive) heart failure: Secondary | ICD-10-CM | POA: Diagnosis present

## 2023-07-04 DIAGNOSIS — E039 Hypothyroidism, unspecified: Secondary | ICD-10-CM | POA: Diagnosis present

## 2023-07-04 DIAGNOSIS — E1122 Type 2 diabetes mellitus with diabetic chronic kidney disease: Secondary | ICD-10-CM | POA: Diagnosis present

## 2023-07-04 DIAGNOSIS — K219 Gastro-esophageal reflux disease without esophagitis: Secondary | ICD-10-CM | POA: Diagnosis present

## 2023-07-04 DIAGNOSIS — E1165 Type 2 diabetes mellitus with hyperglycemia: Secondary | ICD-10-CM | POA: Diagnosis present

## 2023-07-04 DIAGNOSIS — I10 Essential (primary) hypertension: Secondary | ICD-10-CM | POA: Diagnosis present

## 2023-07-04 DIAGNOSIS — Z7984 Long term (current) use of oral hypoglycemic drugs: Secondary | ICD-10-CM

## 2023-07-04 DIAGNOSIS — I452 Bifascicular block: Secondary | ICD-10-CM | POA: Diagnosis present

## 2023-07-04 DIAGNOSIS — E785 Hyperlipidemia, unspecified: Secondary | ICD-10-CM | POA: Diagnosis present

## 2023-07-04 DIAGNOSIS — N183 Chronic kidney disease, stage 3 unspecified: Secondary | ICD-10-CM | POA: Diagnosis present

## 2023-07-04 DIAGNOSIS — Z953 Presence of xenogenic heart valve: Secondary | ICD-10-CM

## 2023-07-04 DIAGNOSIS — I4819 Other persistent atrial fibrillation: Secondary | ICD-10-CM | POA: Diagnosis present

## 2023-07-04 DIAGNOSIS — Z79899 Other long term (current) drug therapy: Secondary | ICD-10-CM

## 2023-07-04 LAB — BASIC METABOLIC PANEL
Anion gap: 15 (ref 5–15)
BUN: 16 mg/dL (ref 6–20)
CO2: 23 mmol/L (ref 22–32)
Calcium: 9.1 mg/dL (ref 8.9–10.3)
Chloride: 98 mmol/L (ref 98–111)
Creatinine, Ser: 1.35 mg/dL — ABNORMAL HIGH (ref 0.61–1.24)
GFR, Estimated: >60 mL/min (ref >60)
Glucose, Bld: 257 mg/dL — ABNORMAL HIGH (ref 70–99)
Potassium: 4 mmol/L (ref 3.5–5.1)
Sodium: 136 mmol/L (ref 135–145)

## 2023-07-04 LAB — CBC
HCT: 39.6 % (ref 39.0–52.0)
Hemoglobin: 13.2 g/dL (ref 13.0–17.0)
MCH: 29 pg (ref 26.0–34.0)
MCHC: 33.3 g/dL (ref 30.0–36.0)
MCV: 87 fL (ref 80.0–100.0)
Platelets: 282 10*3/uL (ref 150–400)
RBC: 4.55 MIL/uL (ref 4.22–5.81)
RDW: 13.2 % (ref 11.5–15.5)
WBC: 11.6 10*3/uL — ABNORMAL HIGH (ref 4.0–10.5)
nRBC: 0 % (ref 0.0–0.2)

## 2023-07-04 LAB — PROTIME-INR
INR: 1.1 (ref 0.8–1.2)
Prothrombin Time: 14 s (ref 11.4–15.2)

## 2023-07-04 LAB — APTT: aPTT: 27 s (ref 24–36)

## 2023-07-04 LAB — TROPONIN I (HIGH SENSITIVITY)
Troponin I (High Sensitivity): 21 ng/L — ABNORMAL HIGH (ref <18)
Troponin I (High Sensitivity): 22 ng/L — ABNORMAL HIGH (ref <18)
Troponin I (High Sensitivity): 23 ng/L — ABNORMAL HIGH (ref <18)

## 2023-07-04 LAB — CBG MONITORING, ED: Glucose-Capillary: 142 mg/dL — ABNORMAL HIGH (ref 70–99)

## 2023-07-04 LAB — TSH: TSH: 0.643 u[IU]/mL (ref 0.350–4.500)

## 2023-07-04 MED ORDER — SODIUM CHLORIDE 0.9% FLUSH: 3.0000 mL | INTRAVENOUS | Status: DC | PRN

## 2023-07-04 MED ORDER — NITROGLYCERIN 0.4 MG SL SUBL
0.4000 mg | SUBLINGUAL_TABLET | SUBLINGUAL | Status: DC | PRN
  Administered 2023-07-05 (×2): 0.4 mg via SUBLINGUAL
  Filled 2023-07-04: qty 1

## 2023-07-04 MED ORDER — METFORMIN HCL 500 MG PO TABS: 1000.0000 mg | ORAL_TABLET | Freq: Two times a day (BID) | ORAL | Status: DC

## 2023-07-04 MED ORDER — ONDANSETRON HCL 4 MG/2ML IJ SOLN: 4.0000 mg | Freq: Four times a day (QID) | INTRAMUSCULAR | Status: DC | PRN

## 2023-07-04 MED ORDER — SODIUM CHLORIDE 0.9% FLUSH
3.0000 mL | Freq: Two times a day (BID) | INTRAVENOUS | Status: DC
  Administered 2023-07-04: 10 mL via INTRAVENOUS
  Administered 2023-07-05: 5 mL via INTRAVENOUS
  Administered 2023-07-05 – 2023-07-06 (×2): 10 mL via INTRAVENOUS

## 2023-07-04 MED ORDER — IOHEXOL 350 MG/ML SOLN
100.0000 mL | Freq: Once | INTRAVENOUS | Status: AC | PRN
  Administered 2023-07-04: 100 mL via INTRAVENOUS

## 2023-07-04 MED ORDER — AMIODARONE HCL 200 MG PO TABS
200.0000 mg | ORAL_TABLET | Freq: Every day | ORAL | Status: DC
  Administered 2023-07-04 – 2023-07-05 (×2): 200 mg via ORAL
  Filled 2023-07-04 (×2): qty 1

## 2023-07-04 MED ORDER — LIDOCAINE VISCOUS HCL 2 % MT SOLN
15.0000 mL | Freq: Once | OROMUCOSAL | Status: AC
  Administered 2023-07-04: 15 mL via ORAL
  Filled 2023-07-04: qty 15

## 2023-07-04 MED ORDER — PANTOPRAZOLE SODIUM 40 MG PO TBEC
40.0000 mg | DELAYED_RELEASE_TABLET | Freq: Two times a day (BID) | ORAL | Status: DC
  Administered 2023-07-04 – 2023-07-06 (×4): 40 mg via ORAL
  Filled 2023-07-04 (×4): qty 1

## 2023-07-04 MED ORDER — ATORVASTATIN CALCIUM 20 MG PO TABS
80.0000 mg | ORAL_TABLET | Freq: Every evening | ORAL | Status: DC
  Administered 2023-07-04 – 2023-07-05 (×2): 80 mg via ORAL
  Filled 2023-07-04 (×2): qty 4

## 2023-07-04 MED ORDER — SUCRALFATE 1 GM/10ML PO SUSP
1.0000 g | Freq: Three times a day (TID) | ORAL | Status: DC
  Administered 2023-07-04 – 2023-07-06 (×7): 1 g via ORAL
  Filled 2023-07-04 (×9): qty 10

## 2023-07-04 MED ORDER — SPIRONOLACTONE 25 MG PO TABS
25.0000 mg | ORAL_TABLET | Freq: Every day | ORAL | Status: DC
  Administered 2023-07-05 – 2023-07-06 (×2): 25 mg via ORAL
  Filled 2023-07-04 (×2): qty 1

## 2023-07-04 MED ORDER — TRAMADOL HCL 50 MG PO TABS
50.0000 mg | ORAL_TABLET | Freq: Four times a day (QID) | ORAL | Status: DC | PRN
  Administered 2023-07-04 – 2023-07-05 (×2): 50 mg via ORAL
  Filled 2023-07-04 (×2): qty 1

## 2023-07-04 MED ORDER — ALUM & MAG HYDROXIDE-SIMETH 200-200-20 MG/5ML PO SUSP
30.0000 mL | Freq: Once | ORAL | Status: AC
  Administered 2023-07-04: 30 mL via ORAL
  Filled 2023-07-04: qty 30

## 2023-07-04 MED ORDER — METOPROLOL TARTRATE 25 MG PO TABS
12.5000 mg | ORAL_TABLET | Freq: Two times a day (BID) | ORAL | Status: DC
  Administered 2023-07-04 – 2023-07-06 (×4): 12.5 mg via ORAL
  Filled 2023-07-04 (×4): qty 1

## 2023-07-04 MED ORDER — FUROSEMIDE 40 MG PO TABS: 40.0000 mg | ORAL_TABLET | Freq: Every day | ORAL | Status: DC | PRN

## 2023-07-04 MED ORDER — GABAPENTIN 300 MG PO CAPS
300.0000 mg | ORAL_CAPSULE | Freq: Two times a day (BID) | ORAL | Status: DC
  Administered 2023-07-04 – 2023-07-06 (×4): 300 mg via ORAL
  Filled 2023-07-04 (×4): qty 1

## 2023-07-04 MED ORDER — ASPIRIN 81 MG PO CHEW
324.0000 mg | CHEWABLE_TABLET | Freq: Once | ORAL | Status: AC
  Administered 2023-07-04: 324 mg via ORAL
  Filled 2023-07-04: qty 4

## 2023-07-04 MED ORDER — ASPIRIN 81 MG PO TBEC
81.0000 mg | DELAYED_RELEASE_TABLET | Freq: Every day | ORAL | Status: DC
  Administered 2023-07-05 – 2023-07-06 (×2): 81 mg via ORAL
  Filled 2023-07-04 (×2): qty 1

## 2023-07-04 MED ORDER — GLIMEPIRIDE 2 MG PO TABS
2.0000 mg | ORAL_TABLET | Freq: Every day | ORAL | Status: DC
Start: 2023-07-05 — End: 2023-07-06
  Administered 2023-07-05 – 2023-07-06 (×2): 2 mg via ORAL
  Filled 2023-07-04 (×2): qty 1

## 2023-07-04 MED ORDER — IRBESARTAN 150 MG PO TABS
150.0000 mg | ORAL_TABLET | Freq: Every day | ORAL | Status: DC
  Administered 2023-07-05 – 2023-07-06 (×2): 150 mg via ORAL
  Filled 2023-07-04 (×2): qty 1

## 2023-07-04 MED ORDER — LEVOTHYROXINE SODIUM 50 MCG PO TABS
300.0000 ug | ORAL_TABLET | Freq: Every day | ORAL | Status: DC
  Administered 2023-07-05 – 2023-07-06 (×2): 300 ug via ORAL
  Filled 2023-07-04 (×2): qty 6

## 2023-07-04 MED ORDER — INSULIN ASPART 100 UNIT/ML IJ SOLN
0.0000 [IU] | Freq: Three times a day (TID) | INTRAMUSCULAR | Status: DC
  Administered 2023-07-05 – 2023-07-06 (×3): 4 [IU] via SUBCUTANEOUS
  Filled 2023-07-04 (×3): qty 1

## 2023-07-04 MED ORDER — LACTATED RINGERS IV BOLUS
1000.0000 mL | Freq: Once | INTRAVENOUS | Status: AC
  Administered 2023-07-04: 1000 mL via INTRAVENOUS

## 2023-07-04 MED ORDER — RIVAROXABAN 20 MG PO TABS
20.0000 mg | ORAL_TABLET | Freq: Every day | ORAL | Status: DC
  Administered 2023-07-04 – 2023-07-05 (×2): 20 mg via ORAL
  Filled 2023-07-04 (×2): qty 1

## 2023-07-04 MED ORDER — POTASSIUM CHLORIDE CRYS ER 20 MEQ PO TBCR
20.0000 meq | EXTENDED_RELEASE_TABLET | Freq: Every day | ORAL | Status: DC
  Administered 2023-07-04 – 2023-07-06 (×3): 20 meq via ORAL
  Filled 2023-07-04 (×3): qty 1

## 2023-07-04 NOTE — H&P (Signed)
History and Physical    Patient: Craig Reese ZOX:[id number] DOB: 09-26-1960 DOA: 07/04/2023 DOS: the patient was seen and examined on 07/04/2023 PCP: Katrina Stack, MD  Patient coming from:  History and Physical    Craig Reese WJX:[id number] DOB: 1960-06-16 DOA: 07/04/2023  DOS: the patient was seen and examined on 07/04/2023  PCP: Katrina Stack, MD   Patient coming from: Home  I have personally briefly reviewed patient's old medical records in [facility]  Craig Reese, a 58 spanish speaking man has a h/o CAD s/p Single vessel CABG SVG to OM, h/o A0VR 2015 with TAVR Maay /24, PAF on Xeralto, DM, HTN, HLD. He saw his interventional cardiology at [facility] 06/11/33 for preop clearance for radical neck dissection 07/08/23 and was deemed stable for surgery. Per note he could walk several blocks and climb two sets of stairs w/o symptoms.  This AM Craig Reese reports the onset of substernal chest pain, which by his report via translator seems to eminate from his left neck. There was no radiation of pain, no diaphoresis, no acute SOB. EMS activated and by EMT report to EDP he had a run of V-tach that resolved spontaneously - no EKG strip was run. He was transported to [facility] for further evaluation.    ED Course: T 98.2  159/92  HR 76  RR 21. Overweight man able to give a good history with the use of a translator, who at interview had continued chest pain which he felt came from his neck to his chest. Lab: Glucose 257, Cr 1.35 (chronid) Troponins 21 to 22. CTA chest - no evidence of aneuryzm or dissection. EKG w/op acute changes. TRH called to admit for observation evaluation for atypical chest pain.   Review of Systems:  Review of Systems  Constitutional:  Negative for chills, malaise/fatigue and weight loss.  HENT: Negative.    Eyes: Negative.   Respiratory:  Positive for shortness of breath.   Cardiovascular:  Positive for chest pain.  Negative for palpitations, orthopnea, leg swelling and PND.  Gastrointestinal: Negative.   Genitourinary: Negative.   Musculoskeletal: Negative.   Skin: Negative.   Neurological: Negative.        Felt presyncopal when chest pain started.   Endo/Heme/Allergies: Negative.   Psychiatric/Behavioral: Negative.      Past Medical History:  Diagnosis Date   Aortic valve endocarditis    a. 2015 s/p bioprosthetic AVR.   Chronic chest pain    Chronic heart failure with preserved ejection fraction (HFpEF) (HCC)    a. 08/2022 Echo: Ef 55-60%, no rwma, GrII DD, nl RV fxn, sev dil LA, mod dil RA, mild MR, mod AS (mean grad 29.45mmHg, AVA 1.24cm^2).   CKD (chronic kidney disease), stage III (HCC)    Coronary artery disease    a. 2015 s/p CABG x 1 (VG->OM); b. 04/2020 PET CT: apical, apical inf, mid inf, and basal inf ischemia. EF 41%; c. 05/2020 Cath: Sev OM4 dzs, patent graft, mod diff dzs; d. 07/2022 Cath [facility]): LM nl, LAD 40-50, LCX nl, OM4 80, RCA 50-60p, VG->OM 50p->Med rx.   Diabetes mellitus type II, non insulin dependent (HCC) 11/17/2018   Elevated troponin (chronic)    Essential hypertension    Hyperlipidemia LDL goal <70    Hypothyroidism 11/17/2018   Normocytic anemia    OSA (obstructive sleep apnea)    a. not on CPAP.   Persistent atrial fibrillation (HCC)    a. CHA2DS2VASc = 4-->xarelto/amio.   RBBB  Thoracic aortic aneurysm (HCC)    a. 08/2022 CT - 4.1cm.    Past Surgical History:  Procedure Laterality Date   AORTIC VALVE REPLACEMENT     done at [facility] (?)2024, 2nd replacement   CORONARY ARTERY BYPASS GRAFT     LEFT HEART CATH AND CORS/GRAFTS ANGIOGRAPHY N/A 05/17/2018   Procedure: LEFT HEART CATH AND CORS/GRAFTS ANGIOGRAPHY and possible PCI and stent;  Surgeon: Alwyn Pea, MD;  Location: [facility] INVASIVE CV LAB;  Service: Cardiovascular;  Laterality: N/A;   RIGHT/LEFT HEART CATH AND CORONARY/GRAFT ANGIOGRAPHY N/A 10/09/2022   Procedure: RIGHT/LEFT HEART CATH AND  CORONARY/GRAFT ANGIOGRAPHY;  Surgeon: Marykay Lex, MD;  Location: [facility] INVASIVE CV LAB;  Service: Cardiovascular;  Laterality: N/A;    Soc hx - single, he has two children.    reports that he has never smoked. He has never used smokeless tobacco. He reports that he does not currently use alcohol. He reports that he does not use drugs.  No Known Allergies  Family History  Problem Relation Age of Onset   Heart disease Brother     Prior to Admission medications   Medication Sig Start Date End Date Taking? Authorizing Provider  amiodarone (PACERONE) 200 MG tablet Take 1 tablet (200 mg total) by mouth daily. 09/03/22   Sunnie Nielsen, DO  aspirin EC 81 MG tablet Take 1 tablet (81 mg total) by mouth daily. Swallow whole. 04/29/23   Leeroy Bock, MD  atorvastatin (LIPITOR) 80 MG tablet Take 80 mg by mouth every evening.    [provider]  diclofenac Sodium (VOLTAREN) 1 % GEL Apply 2 g topically 4 (four) times daily as needed. 09/08/22   Marrion Coy, MD  furosemide (LASIX) 40 MG tablet Take 1 tablet (40 mg total) by mouth daily as needed. for up to 3 days for increased leg swelling, shortness of breath, weight gain 5+ lbs over 1-2 days. Seek medical care if these symptoms are not improving with increased dose. 08/26/22   Sunnie Nielsen, DO  gabapentin (NEURONTIN) 300 MG capsule Take 300 mg by mouth 2 (two) times daily.    [provider]  glimepiride (AMARYL) 2 MG tablet Take 1 tablet by mouth daily. 02/11/23   [provider]  levothyroxine (SYNTHROID) 150 MCG tablet Take 2 tablets (300 mcg total) by mouth daily before breakfast. 04/29/23 05/29/23  Leeroy Bock, MD  metFORMIN (GLUCOPHAGE) 1000 MG tablet Take 1,000 mg by mouth 2 (two) times daily with a meal. 02/11/23   [provider]  metoprolol tartrate (LOPRESSOR) 25 MG tablet Take 0.5 tablets (12.5 mg total) by mouth 2 (two) times daily. 04/28/23 05/28/23  Leeroy Bock, MD   nitroGLYCERIN (NITROSTAT) 0.4 MG SL tablet Place 0.4 mg under the tongue every 5 (five) minutes as needed for chest pain. 02/11/23   [provider]  pantoprazole (PROTONIX) 40 MG tablet Take 40 mg by mouth 2 (two) times daily. 02/11/23   [provider]  potassium chloride SA (KLOR-CON M) 20 MEQ tablet Take 1 tablet (20 mEq total) by mouth daily. 10/11/22   Alford Highland, MD  rivaroxaban (XARELTO) 20 MG TABS tablet Take 1 tablet by mouth at bedtime. 02/11/23   [provider]  spironolactone (ALDACTONE) 25 MG tablet Take 1 tablet by mouth daily. 02/11/23   [provider]  sucralfate (CARAFATE) 1 GM/10ML suspension Take 1 g by mouth 4 (four) times daily -  with meals and at bedtime.    [provider]  valsartan (DIOVAN) 160 MG tablet Take 1 tablet by mouth daily. 02/11/23   [provider]    Physical Exam: Vitals:   07/04/23 1630 07/04/23 1900 07/04/23 1924 07/04/23 1932  BP: 106/66 (!) 159/92    Pulse: 89 76    Resp: 15 (!) 21    Temp:    98.2 F (36.8 C)  TempSrc:    Oral  SpO2: 98% 98% 98%   Weight:      Height:        Physical Exam Vitals and nursing note reviewed.  Constitutional:      General: He is not in acute distress.    Appearance: He is obese. He is not ill-appearing.  HENT:     Head: Normocephalic and atraumatic.  Eyes:     Extraocular Movements: Extraocular movements intact.     Pupils: Pupils are equal, round, and reactive to light.  Neck:     Vascular: No JVD.     Trachea: No tracheal deviation.     Comments: Tender posterior chain lymphnodes left neck. Cardiovascular:     Rate and Rhythm: Normal rate and regular rhythm.     Pulses:          Carotid pulses are 2+ on the right side and 2+ on the left side.      Radial pulses are 2+ on the right side and 2+ on the left side.       Dorsalis pedis pulses are 2+ on the right side and 2+ on the left side.       Posterior tibial pulses are 2+ on the right side  and 2+ on the left side.     Heart sounds: Heart sounds not distant. Murmur heard.     Systolic murmur is present with a grade of 3/6.     No diastolic murmur is present.  Pulmonary:     Effort: Pulmonary effort is normal. No tachypnea, accessory muscle usage or respiratory distress.     Breath sounds: Normal breath sounds.  Abdominal:     General: Bowel sounds are normal.     Palpations: Abdomen is soft.  Musculoskeletal:        General: Normal range of motion.     Cervical back: Normal range of motion and neck supple.     Right lower leg: No tenderness. No edema.     Left lower leg: No tenderness. No edema.  Lymphadenopathy:     Cervical: Cervical adenopathy present.  Skin:    General: Skin is warm and dry.  Neurological:     General: No focal deficit present.     Mental Status: He is alert and oriented to person, place, and time.     Cranial Nerves: No cranial nerve deficit.  Psychiatric:        Mood and Affect: Mood normal.      Labs on Admission: I have personally reviewed following labs and imaging studies  CBC: Recent Labs  Lab 07/04/23 1541  WBC 11.6*  HGB 13.2  HCT 39.6  MCV 87.0  PLT 282   Basic Metabolic Panel: Recent Labs  Lab 07/04/23 1541  NA 136  K 4.0  CL 98  CO2 23  GLUCOSE 257*  BUN 16  CREATININE 1.35*  CALCIUM 9.1   GFR: Estimated Creatinine Clearance: 77.4 mL/min (A) (by C-G formula based on SCr of 1.35 mg/dL (H)). Liver Function Tests: No results for input(s): "AST", "ALT", "ALKPHOS", "BILITOT", "PROT", "ALBUMIN" in the last 168 hours.  No results for input(s): "LIPASE", "AMYLASE" in the last 168 hours. No results for input(s): "AMMONIA" in the last 168 hours. Coagulation Profile: Recent Labs  Lab 07/04/23 1541  INR 1.1   Cardiac Enzymes: No results for input(s): "CKTOTAL", "CKMB", "CKMBINDEX", "TROPONINI" in the last 168 hours. BNP (last 3 results) No results for input(s): "PROBNP" in the last 8760 hours. HbA1C: No results  for input(s): "HGBA1C" in the last 72 hours. CBG: No results for input(s): "GLUCAP" in the last 168 hours. Lipid Profile: No results for input(s): "CHOL", "HDL", "LDLCALC", "TRIG", "CHOLHDL", "LDLDIRECT" in the last 72 hours. Thyroid Function Tests: No results for input(s): "TSH", "T4TOTAL", "FREET4", "T3FREE", "THYROIDAB" in the last 72 hours. Anemia Panel: No results for input(s): "VITAMINB12", "FOLATE", "FERRITIN", "TIBC", "IRON", "RETICCTPCT" in the last 72 hours. Urine analysis:    Component Value Date/Time   COLORURINE YELLOW (A) 09/15/2022 0336   APPEARANCEUR HAZY (A) 09/15/2022 0336   APPEARANCEUR Clear 09/22/2013 2248   LABSPEC 1.027 09/15/2022 0336   LABSPEC >1.060 09/22/2013 2248   PHURINE 6.0 09/15/2022 0336   GLUCOSEU >=500 (A) 09/15/2022 0336   GLUCOSEU Negative 09/22/2013 2248   HGBUR NEGATIVE 09/15/2022 0336   BILIRUBINUR NEGATIVE 09/15/2022 0336   BILIRUBINUR Negative 09/22/2013 2248   KETONESUR NEGATIVE 09/15/2022 0336   PROTEINUR 100 (A) 09/15/2022 0336   NITRITE NEGATIVE 09/15/2022 0336   LEUKOCYTESUR NEGATIVE 09/15/2022 0336   LEUKOCYTESUR Negative 09/22/2013 2248    Radiological Exams on Admission: I have personally reviewed images CT Angio Chest/Abd/Pel for Dissection W and/or Wo Contrast Result Date: 07/04/2023 CLINICAL DATA:  Chest pain EXAM: CT ANGIOGRAPHY CHEST, ABDOMEN AND PELVIS TECHNIQUE: Non-contrast CT of the chest was initially obtained. Multidetector CT imaging through the chest, abdomen and pelvis was performed using the standard protocol during bolus administration of intravenous contrast. Multiplanar reconstructed images and MIPs were obtained and reviewed to evaluate the vascular anatomy. RADIATION DOSE REDUCTION: This exam was performed according to the departmental dose-optimization program which includes automated exposure control, adjustment of the mA and/or kV according to patient size and/or use of iterative reconstruction technique.  CONTRAST:  OMNIPAQUE IOHEXOL 350 MG/ML SOLN COMPARISON:  CT chest 10/08/2022 CT AP from 08/11/2013 FINDINGS: CTA CHEST FINDINGS Cardiovascular: Preferential opacification of the thoracic aorta. No evidence of thoracic aortic aneurysm or dissection. Normal heart size. No pericardial effusion. Aortic atherosclerosis. Previous median sternotomy and CABG procedure. Calcifications of the native coronary arteries. Mediastinum/Nodes: No enlarged mediastinal, hilar, or axillary lymph nodes. Thyroid gland, trachea, and esophagus demonstrate no significant findings. Lungs/Pleura: No pleural effusion, airspace consolidation, atelectasis or pneumothorax. No signs of interstitial edema. Musculoskeletal: Previous median sternotomy. No acute or suspicious osseous findings. Review of the MIP images confirms the above findings. CTA ABDOMEN AND PELVIS FINDINGS VASCULAR Aorta: Normal caliber aorta without aneurysm, dissection, vasculitis or significant stenosis. Aortic atherosclerotic calcifications. Celiac: Patent without evidence of aneurysm, dissection, vasculitis or significant stenosis. SMA: Patent without evidence of aneurysm, dissection, vasculitis or significant stenosis. Renals: Both renal arteries are patent without evidence of aneurysm, dissection, vasculitis, fibromuscular dysplasia or significant stenosis. IMA: Patent without evidence of aneurysm, dissection, vasculitis or significant stenosis. Inflow: Patent without evidence of aneurysm, dissection, vasculitis or significant stenosis. Veins: No obvious venous abnormality within the limitations of this arterial phase study. Review of the MIP images confirms the above findings. NON-VASCULAR Hepatobiliary: No focal liver abnormality is seen. No gallstones, gallbladder wall thickening, or biliary dilatation. Pancreas: Unremarkable. No pancreatic ductal dilatation or surrounding inflammatory changes. Spleen: Normal in size  without focal abnormality. Adrenals/Urinary  Tract: Normal adrenal glands. 4 mm stone within the inferior pole of the left kidney, image 178/5. No mass or obstructive uropathy identified. Urinary bladder is unremarkable. Stomach/Bowel: Stomach appears normal. No dilated loops of large or small bowel. No bowel wall thickening or inflammation. Lymphatic: Prominent retroperitoneal lymph nodes are identified. These measure up to 9 mm. Index left periaortic node measures 0.9 cm, image 193/5. Aortocaval lymph node measures 0.8 cm, image 215/5. Reproductive: No mass identified. Other: No free fluid or fluid collections. Musculoskeletal: No acute or significant osseous findings. Review of the MIP images confirms the above findings. IMPRESSION: 1. No evidence for aortic aneurysm or dissection. 2. Previous median sternotomy and CABG procedure. 3. Nonobstructing left renal calculus. 4. Prominent retroperitoneal lymph nodes are identified. These measure up to 9 mm. These are nonspecific and may be reactive in etiology. 5.  Aortic Atherosclerosis (ICD10-I70.0). Electronically Signed   By: Signa Kell M.D.   On: 07/04/2023 18:06   DG Chest Portable 1 View Result Date: 07/04/2023 CLINICAL DATA:  Chest pain EXAM: PORTABLE CHEST 1 VIEW COMPARISON:  04/25/2023. FINDINGS: Cardiac silhouette appears prominent. No pneumonia or pulmonary edema. No pneumothorax or pleural effusion. Calcified aorta. Status post median sternotomy. Osseous structures appear grossly intact. IMPRESSION: Enlarged cardiac silhouette.  Lungs are clear. Electronically Signed   By: Layla Maw M.D.   On: 07/04/2023 16:18    EKG: I have personally reviewed EKG: Sinus rhythm, RBBB, LAFB, no acute changes  Assessment/Plan Principal Problem:   Atypical chest pain Active Problems:   Nonspecific chest pain   Uncontrolled type 2 diabetes mellitus with hyperglycemia, without long-term current use of insulin (HCC)   GERD (gastroesophageal reflux disease)   Lymphadenopathy of left cervical  region   Hypothyroidism   Chronic diastolic CHF (congestive heart failure) (HCC)   HLD (hyperlipidemia)   HTN (hypertension)   Severe obstructive sleep apnea    Assessment and Plan: Nonspecific chest pain Patient with onset of chest pain this AM. He was last seen by his interventional cardiology 06/12/23 and was cleared for surgery on 07/08/23 w/o need for further cardiac evaluation. Last cardiac cath June '24 showed no change from previous cath. Patient has good exercise tolerance. His pain seems to eminate from the enlarged tender lymph node chain left posterior neck, for which he is schedule to have modified radical dissection. Troponins flat at 21 and 22. EKG with sinus rhythm, RBBB, LAFB with no acute changes.  Plan Observation tele admit for ruling out ACS or significant arrythmia (by EMT report had a run of V-tach in transit to hospital)  Continue home medical regimen  Lymphadenopathy of left cervical region Patient with enlarged lymphnodes left posterior cervical chain vs mass. Followed in [facility] and he is scheduled for modified radical neck dissection 07/08/23. This are is tender.  Plan APAP 500 mg q6  GERD (gastroesophageal reflux disease) May be contributing to atypical chest pain. Nl abdominal exam  Plan Continue home regimen of PPI and carafate  Uncontrolled type 2 diabetes mellitus with hyperglycemia, without long-term current use of insulin (HCC) Admission serum glucose 257. Last A1C 6.7% Dec '24.  Plan Continue home regimen, including metformin  SS coverage AC/HS  Severe obstructive sleep apnea By chart. Will allow home CPAP to be brought in tomorrow.  HTN (hypertension) BP mildly elevated, possible secondary to pain.  Plan Continue home medications  HLD (hyperlipidemia)  Chronic problem followed by cardiology and PCP in [place].   Plan  Continue home meds  Chronic diastolic CHF (congestive heart failure) (HCC) Last ECHO 09/10/22 with EF 55-60%, LVH,  Grade II DD. Appears well compensated. Not SOB.  Plan Continue home meds  Hypothyroidism Last TSH 80 04/25/23.  Plan Add TSH to lab  Continue home dose of levothyroxine       DVT prophylaxis: Xarelto Code Status: Full Code Family Communication: no english speaking contact available  Disposition Plan: home when stable  Consults called: none  Admission status: Observation, Telemetry bed   Illene Regulus, MD [facility] 07/04/2023, 9:30 PM     Chief Complaint:  Chief Complaint  Patient presents with   Chest Pain   HPI: Laurent Cargile is a 63 y.o. male with medical history significant of see HPI above  Review of Systems: As mentioned in the history of present illness. All other systems reviewed and are negative. Past Medical History:  Diagnosis Date   Aortic valve endocarditis    a. 2015 s/p bioprosthetic AVR.   Chronic chest pain    Chronic heart failure with preserved ejection fraction (HFpEF) (HCC)    a. 08/2022 Echo: Ef 55-60%, no rwma, GrII DD, nl RV fxn, sev dil LA, mod dil RA, mild MR, mod AS (mean grad 29.46mmHg, AVA 1.24cm^2).   CKD (chronic kidney disease), stage III (HCC)    Coronary artery disease    a. 2015 s/p CABG x 1 (VG->OM); b. 04/2020 PET CT: apical, apical inf, mid inf, and basal inf ischemia. EF 41%; c. 05/2020 Cath: Sev OM4 dzs, patent graft, mod diff dzs; d. 07/2022 Cath [facility] LM nl, LAD 40-50, LCX nl, OM4 80, RCA 50-60p, VG->OM 50p->Med rx.   Diabetes mellitus type II, non insulin dependent (HCC) 11/17/2018   Elevated troponin (chronic)    Essential hypertension    Hyperlipidemia LDL goal <70    Hypothyroidism 11/17/2018   Normocytic anemia    OSA (obstructive sleep apnea)    a. not on CPAP.   Persistent atrial fibrillation (HCC)    a. CHA2DS2VASc = 4-->xarelto/amio.   RBBB    Thoracic aortic aneurysm (HCC)    a. 08/2022 CT - 4.1cm.   Past Surgical History:  Procedure Laterality Date   AORTIC VALVE REPLACEMENT     done  at [facility] (?)2024, 2nd replacement   CORONARY ARTERY BYPASS GRAFT     LEFT HEART CATH AND CORS/GRAFTS ANGIOGRAPHY N/A 05/17/2018   Procedure: LEFT HEART CATH AND CORS/GRAFTS ANGIOGRAPHY and possible PCI and stent;  Surgeon: Alwyn Pea, MD;  Location: [facility] INVASIVE CV LAB;  Service: Cardiovascular;  Laterality: N/A;   RIGHT/LEFT HEART CATH AND CORONARY/GRAFT ANGIOGRAPHY N/A 10/09/2022   Procedure: RIGHT/LEFT HEART CATH AND CORONARY/GRAFT ANGIOGRAPHY;  Surgeon: Marykay Lex, MD;  Location: [facility] INVASIVE CV LAB;  Service: Cardiovascular;  Laterality: N/A;   Social History:  reports that he has never smoked. He has never used smokeless tobacco. He reports that he does not currently use alcohol. He reports that he does not use drugs.  No Known Allergies  Family History  Problem Relation Age of Onset   Heart disease Brother     Prior to Admission medications   Medication Sig Start Date End Date Taking? Authorizing Provider  amiodarone (PACERONE) 200 MG tablet Take 1 tablet (200 mg total) by mouth daily. 09/03/22   Sunnie Nielsen, DO  aspirin EC 81 MG tablet Take 1 tablet (81 mg total) by mouth daily. Swallow whole. 04/29/23   Leeroy Bock, MD  atorvastatin (LIPITOR) 80  MG tablet Take 80 mg by mouth every evening.    [provider]  diclofenac Sodium (VOLTAREN) 1 % GEL Apply 2 g topically 4 (four) times daily as needed. 09/08/22   Marrion Coy, MD  furosemide (LASIX) 40 MG tablet Take 1 tablet (40 mg total) by mouth daily as needed. for up to 3 days for increased leg swelling, shortness of breath, weight gain 5+ lbs over 1-2 days. Seek medical care if these symptoms are not improving with increased dose. 08/26/22   Sunnie Nielsen, DO  gabapentin (NEURONTIN) 300 MG capsule Take 300 mg by mouth 2 (two) times daily.    [provider]  glimepiride (AMARYL) 2 MG tablet Take 1 tablet by mouth daily. 02/11/23   [provider]  levothyroxine  (SYNTHROID) 150 MCG tablet Take 2 tablets (300 mcg total) by mouth daily before breakfast. 04/29/23 05/29/23  Leeroy Bock, MD  metFORMIN (GLUCOPHAGE) 1000 MG tablet Take 1,000 mg by mouth 2 (two) times daily with a meal. 02/11/23   [provider]  metoprolol tartrate (LOPRESSOR) 25 MG tablet Take 0.5 tablets (12.5 mg total) by mouth 2 (two) times daily. 04/28/23 05/28/23  Leeroy Bock, MD  nitroGLYCERIN (NITROSTAT) 0.4 MG SL tablet Place 0.4 mg under the tongue every 5 (five) minutes as needed for chest pain. 02/11/23   [provider]  pantoprazole (PROTONIX) 40 MG tablet Take 40 mg by mouth 2 (two) times daily. 02/11/23   [provider]  potassium chloride SA (KLOR-CON M) 20 MEQ tablet Take 1 tablet (20 mEq total) by mouth daily. 10/11/22   Alford Highland, MD  rivaroxaban (XARELTO) 20 MG TABS tablet Take 1 tablet by mouth at bedtime. 02/11/23   [provider]  spironolactone (ALDACTONE) 25 MG tablet Take 1 tablet by mouth daily. 02/11/23   [provider]  sucralfate (CARAFATE) 1 GM/10ML suspension Take 1 g by mouth 4 (four) times daily -  with meals and at bedtime.    [provider]  valsartan (DIOVAN) 160 MG tablet Take 1 tablet by mouth daily. 02/11/23   [provider]    Physical Exam: Vitals:   07/04/23 1630 07/04/23 1900 07/04/23 1924 07/04/23 1932  BP: 106/66 (!) 159/92    Pulse: 89 76    Resp: 15 (!) 21    Temp:    98.2 F (36.8 C)  TempSrc:    Oral  SpO2: 98% 98% 98%   Weight:      Height:       No new results to review Data Reviewed:    Assessment and Plan: Nonspecific chest pain Patient with onset of chest pain this AM. He was last seen by his interventional cardiology 06/12/23 and was cleared for surgery on 07/08/23 w/o need for further cardiac evaluation. Last cardiac cath June '24 showed no change from previous cath. Patient has good exercise tolerance. His pain seems to eminate from the enlarged  tender lymph node chain left posterior neck, for which he is schedule to have modified radical dissection. Troponins flat at 21 and 22. EKG with sinus rhythm, RBBB, LAFB with no acute changes.  Plan Observation tele admit for ruling out ACS or significant arrythmia (by EMT report had a run of V-tach in transit to hospital)  Continue home medical regimen  Lymphadenopathy of left cervical region Patient with enlarged lymphnodes left posterior cervical chain vs mass. Followed in [facility] [place] and he is scheduled for modified radical neck dissection 07/08/23. This are is  tender.  Plan APAP 500 mg q6  GERD (gastroesophageal reflux disease) May be contributing to atypical chest pain. Nl abdominal exam  Plan Continue home regimen of PPI and carafate  Uncontrolled type 2 diabetes mellitus with hyperglycemia, without long-term current use of insulin (HCC) Admission serum glucose 257. Last A1C 6.7% Dec '24.  Plan Continue home regimen, including metformin  SS coverage AC/HS  Severe obstructive sleep apnea By chart. Will allow home CPAP to be brought in tomorrow.  HTN (hypertension) BP mildly elevated, possible secondary to pain.  Plan Continue home medications  HLD (hyperlipidemia)  Chronic problem followed by cardiology and PCP in [place] [facility].   Plan Continue home meds  Chronic diastolic CHF (congestive heart failure) (HCC) Last ECHO 09/10/22 with EF 55-60%, LVH, Grade II DD. Appears well compensated. Not SOB.  Plan Continue home meds  Hypothyroidism Last TSH 80 04/25/23.  Plan Add TSH to lab  Continue home dose of levothyroxine      Advance Care Planning:   Code Status: Full Code full code  Consults: none  Family Communication: no english speaking contacts  Severity of Illness: The appropriate patient status for this patient is OBSERVATION. Observation status is judged to be reasonable and necessary in order to provide the required intensity of service to ensure the  patient's safety. The patient's presenting symptoms, physical exam findings, and initial radiographic and laboratory data in the context of their medical condition is felt to place them at decreased risk for further clinical deterioration. Furthermore, it is anticipated that the patient will be medically stable for discharge from the hospital within 2 midnights of admission.   Author: Illene Regulus, MD 07/04/2023 9:30 PM  For on call review [email or web address].

## 2023-07-04 NOTE — ED Provider Notes (Signed)
[facility] Provider Note    Event Date/Time   First MD Initiated Contact with Patient 07/04/23 1541     (approximate)   History   Chest Pain   HPI  Craig Reese is a 63 y.o. male  who presents to the emergency department today because of concern for chest pain. Started this morning around 6 am. Got worse slightly before the patient called 911. Apparently the patient had a short lived run of vtach for EMS during transport. Per chart review the patient has history of CAD, aortic valve replacement.        Physical Exam   Triage Vital Signs: ED Triage Vitals  Encounter Vitals Group     BP 07/04/23 1539 131/79     Systolic BP Percentile --      Diastolic BP Percentile --      Pulse Rate 07/04/23 1539 96     Resp 07/04/23 1539 19     Temp 07/04/23 1539 98.8 F (37.1 C)     Temp Source 07/04/23 1539 Oral     SpO2 07/04/23 1539 98 %     Weight 07/04/23 1536 277 lb 4.8 oz (125.8 kg)     Height 07/04/23 1532 5\' 10"  (1.778 m)     Head Circumference --      Peak Flow --      Pain Score --      Pain Loc --      Pain Education --      Exclude from Growth Chart --     Most recent vital signs: Vitals:   07/04/23 1539  BP: 131/79  Pulse: 96  Resp: 19  Temp: 98.8 F (37.1 C)  SpO2: 98%   General: Awake, alert, oriented. CV:  Good peripheral perfusion. Regular rate and rhythm. Resp:  Normal effort. Lungs clear. Abd:  No distention.    ED Results / Procedures / Treatments   Labs (all labs ordered are listed, but only abnormal results are displayed) Labs Reviewed  BASIC METABOLIC PANEL  CBC  APTT  PROTIME-INR  TROPONIN I (HIGH SENSITIVITY)     EKG  I, Phineas Semen, attending physician, personally viewed and interpreted this EKG  EKG Time: 1537 Rate: 94 Rhythm: sinus rhythm Axis: left axis deviation Intervals: qtc 521 QRS: RBBB,LAFB ST changes: no st elevation Impression: abnormal ekg   RADIOLOGY I  independently interpreted and visualized the CXR. My interpretation: No pneumonia Radiology interpretation:  IMPRESSION:  Enlarged cardiac silhouette.  Lungs are clear.   I independently interpreted and visualized the CT angio dissection. My interpretation: No dissection Radiology interpretation:  IMPRESSION:  1. No evidence for aortic aneurysm or dissection.  2. Previous median sternotomy and CABG procedure.  3. Nonobstructing left renal calculus.  4. Prominent retroperitoneal lymph nodes are identified. These  measure up to 9 mm. These are nonspecific and may be reactive in  etiology.  5.  Aortic Atherosclerosis (ICD10-I70.0).      PROCEDURES:  Critical Care performed: Yes  CRITICAL CARE Performed by: Phineas Semen   Total critical care time: 35 minutes  Critical care time was exclusive of separately billable procedures and treating other patients.  Critical care was necessary to treat or prevent imminent or life-threatening deterioration.  Critical care was time spent personally by me on the following activities: development of treatment plan with patient and/or surrogate as well as nursing, discussions with consultants, evaluation of patient's response to treatment, examination of patient, obtaining history from patient or  surrogate, ordering and performing treatments and interventions, ordering and review of laboratory studies, ordering and review of radiographic studies, pulse oximetry and re-evaluation of patient's condition.   Procedures    MEDICATIONS ORDERED IN ED: Medications  aspirin chewable tablet 324 mg (has no administration in time range)     IMPRESSION / MDM / ASSESSMENT AND PLAN / ED COURSE  I reviewed the triage vital signs and the nursing notes.                              Differential diagnosis includes, but is not limited to, ACS, pneumonia, dissection, PE, esophagitis  Patient's presentation is most consistent with acute presentation with  potential threat to life or bodily function.   The patient is on the cardiac monitor to evaluate for evidence of arrhythmia and/or significant heart rate changes.  Patient presented to the emergency department today because of concern for chest pain. Had a run of Vtach for EMS. Upon arrival EKG consistent with normal sinus rhythm. Will check blood work, CXR. Will give aspirin.  Initial troponin was very minimally elevated.  Chest x-ray without any concerning abnormalities.  I did obtain a CT angio looking for dissection given diagnosis of thoracic aortic aneurysm on chart.  This did not show any dissection nor did it show aneurysm.  Did not show any clear etiology of the patient's pain.  Repeat troponin was stable.  Patient however continued to have 5 out of 10 pain.  Given cardiac history did discuss with Dr. Debby Bud with the hospitalist service who will evaluate for admission.      FINAL CLINICAL IMPRESSION(S) / ED DIAGNOSES   Final diagnoses:  Chest pain, unspecified type     Note:  This document was prepared using Dragon voice recognition software and may include unintentional dictation errors.    Phineas Semen, MD 07/04/23 2118

## 2023-07-04 NOTE — Assessment & Plan Note (Signed)
Last ECHO 09/10/22 with EF 55-60%, LVH, Grade II DD. Appears well compensated. Not SOB.  Plan Continue home meds

## 2023-07-04 NOTE — Assessment & Plan Note (Signed)
Admission serum glucose 257. Last A1C 6.7% Dec '24.  Plan Continue home regimen, including metformin  SS coverage AC/HS

## 2023-07-04 NOTE — Assessment & Plan Note (Signed)
BP mildly elevated, possible secondary to pain.  Plan Continue home medications

## 2023-07-04 NOTE — Assessment & Plan Note (Signed)
May be contributing to atypical chest pain. Nl abdominal exam  Plan Continue home regimen of PPI and carafate

## 2023-07-04 NOTE — Assessment & Plan Note (Signed)
Last TSH 80 04/25/23.  Plan Add TSH to lab  Continue home dose of levothyroxine

## 2023-07-04 NOTE — Assessment & Plan Note (Signed)
Patient with enlarged lymphnodes left posterior cervical chain vs mass. Followed in [facility] and he is scheduled for modified radical neck dissection 07/08/23. This are is tender.  Plan APAP 500 mg q6

## 2023-07-04 NOTE — Assessment & Plan Note (Signed)
By chart. Will allow home CPAP to be brought in tomorrow.

## 2023-07-04 NOTE — Assessment & Plan Note (Signed)
Patient with onset of chest pain this AM. He was last seen by his interventional cardiology 06/12/23 and was cleared for surgery on 07/08/23 w/o need for further cardiac evaluation. Last cardiac cath June '24 showed no change from previous cath. Patient has good exercise tolerance. His pain seems to eminate from the enlarged tender lymph node chain left posterior neck, for which he is schedule to have modified radical dissection. Troponins flat at 21 and 22. EKG with sinus rhythm, RBBB, LAFB with no acute changes.  Plan Observation tele admit for ruling out ACS or significant arrythmia (by EMT report had a run of V-tach in transit to hospital)  Continue home medical regimen

## 2023-07-04 NOTE — ED Triage Notes (Signed)
Pt via [facility] from home. Pt c/o CP that start 0600 this AM but reports got worse right before he called EMS. Reports 2 Nitro prior to EMS arrival. pt has a hx of CABG. EMS reports that he was unable to chew ASA. Pt had a run of VTach with EMS, reports on arrival to ED pt is NSR with HR of 90. Pt has an extensive cardiac hx. Pt is alert but lethargic on arrival.  320 CBG   18 G R AC  125/70 BP  98% on RA

## 2023-07-04 NOTE — Assessment & Plan Note (Signed)
Chronic problem followed by cardiology and PCP in [place].   Plan Continue home meds

## 2023-07-04 NOTE — Subjective & Objective (Signed)
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 He saw his interventional cardiology at [facility] 06/11/33 for preop clearance for radical neck dissection 07/08/23 and was deemed stable for surgery. Per note he could walk several blocks and climb two sets of stairs w/o symptoms.  This AM Craig Reese reports the onset of substernal chest pain, which by his report via translator seems to eminate from his left neck. There was no radiation of pain, no diaphoresis, no acute SOB. EMS activated and by EMT report to EDP he had a run of V-tach that resolved spontaneously - no EKG strip was run. He was transported to [facility] for further evaluation.

## 2023-07-05 LAB — LIPID PANEL
Cholesterol: 169 mg/dL (ref 0–200)
HDL: 26 mg/dL — ABNORMAL LOW (ref >40)
LDL Cholesterol: 102 mg/dL — ABNORMAL HIGH (ref 0–99)
Total CHOL/HDL Ratio: 6.5 {ratio}
Triglycerides: 203 mg/dL — ABNORMAL HIGH (ref <150)
VLDL: 41 mg/dL — ABNORMAL HIGH (ref 0–40)

## 2023-07-05 LAB — CBG MONITORING, ED
Glucose-Capillary: 157 mg/dL — ABNORMAL HIGH (ref 70–99)
Glucose-Capillary: 168 mg/dL — ABNORMAL HIGH (ref 70–99)
Glucose-Capillary: 108 mg/dL — ABNORMAL HIGH (ref 70–99)
Glucose-Capillary: 211 mg/dL — ABNORMAL HIGH (ref 70–99)

## 2023-07-05 LAB — HEMOGLOBIN A1C
Hgb A1c MFr Bld: 7 % — ABNORMAL HIGH (ref 4.8–5.6)
Mean Plasma Glucose: 154.2 mg/dL

## 2023-07-05 LAB — MAGNESIUM: Magnesium: 1.9 mg/dL (ref 1.7–2.4)

## 2023-07-05 LAB — TROPONIN I (HIGH SENSITIVITY): Troponin I (High Sensitivity): 28 ng/L — ABNORMAL HIGH (ref <18)

## 2023-07-05 MED ORDER — MAGNESIUM SULFATE IN D5W 1-5 GM/100ML-% IV SOLN
1.0000 g | Freq: Once | INTRAVENOUS | Status: AC
Start: 2023-07-05 — End: 2023-07-05
  Administered 2023-07-05: 1 g via INTRAVENOUS
  Filled 2023-07-05: qty 100

## 2023-07-05 NOTE — Progress Notes (Signed)
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 He saw his interventional cardiology at [facility] 06/11/33 for preop clearance for radical neck dissection 07/08/23 and was deemed stable for surgery. Per note he could walk several blocks and climb two sets of stairs w/o symptoms.   This AM Mr. Orlich reports the onset of substernal chest pain, which by his report via translator seems to eminate from his left neck. There was no radiation of pain, no diaphoresis, no acute SOB. EMS activated and by EMT report to EDP he had a run of V-tach that resolved spontaneously - no EKG strip was run. He was transported to [facility] for further evaluation.     ED Course: T 98.2  159/92  HR 76  RR 21. Overweight man able to give a good history with the use of a translator, who at interview had continued chest pain which he felt came from his neck to his chest. Lab: Glucose 257, Cr 1.35 (chronid) Troponins 21 to 22. CTA chest - no evidence of aneuryzm or dissection. EKG w/op acute changes. TRH called to admit for observation evaluation for atypical chest pain.   Assessment and Plan:  Nonspecific chest pain Patient with onset of chest pain this AM. He was last seen by his interventional cardiology 06/12/23 and was cleared for surgery on 07/08/23 w/o need for further cardiac evaluation. Last cardiac cath June '24 showed no change from previous cath. Patient has good exercise tolerance. His pain seems to eminate from the enlarged tender lymph node chain left posterior neck, for which he is schedule to have modified radical dissection. Troponins flat at 21 and 22. EKG with sinus rhythm, RBBB, LAFB with no acute changes.  -Observation tele admit for ruling  out ACS or significant arrythmia (by EMT report had a run of V-tach in transit to hospital) -Continue home medical regimen -Continue with aspirin 81 mg daily.  Nitroglycerin as needed for chest pain. -Replete electrolyte keep potassium above 4 and magnesium above 2.  Lymphadenopathy of left cervical region Patient with enlarged lymphnodes left posterior cervical chain vs mass. Followed in [facility] and he is scheduled for modified radical neck dissection 07/08/23. This are is tender.  APAP 500 mg q6  GERD (gastroesophageal reflux disease) May be contributing to atypical chest pain. Nl abdominal exam  Plan Continue home regimen of PPI and carafate  Uncontrolled type 2 diabetes mellitus with hyperglycemia, without long-term current use of insulin (HCC) Admission serum glucose 257. Last A1C 6.7% Dec '24.  Continue home regimen, including metformin SS coverage AC/HS  Severe obstructive sleep apnea Will allow home CPAP to be brought in tomorrow.  HTN (hypertension) BP mildly elevated, possible secondary to pain.  Continue home medications  HLD (hyperlipidemia)  Chronic problem followed by cardiology and PCP in [place].   Continue home meds  Chronic diastolic CHF (congestive heart failure) (HCC) Last ECHO 09/10/22 with EF 55-60%, LVH, Grade II DD. Appears well compensated. Not SOB.  Continue home meds  Hypothyroidism Last TSH 80 04/25/23.  Paroxysmal A-fib : Rate controlled on amiodarone and rivaroxaban for stroke prevention will continue with both.  Add TSH to lab Continue home dose of levothyroxine  Subjective: Patient seen and examined this morning.  As per nursing patient has not been having pain however reported mild pain at the time of my interview.  Was given nitroglycerin.  Otherwise no active issues.  Physical Exam: Vitals:   07/05/23 0800 07/05/23 0939 07/05/23 0944 07/05/23 1109  BP: (!) 168/88 132/73 132/73   Pulse: 71 77 77   Resp: 20  18   Temp:     98.6 F (37 C)  TempSrc:      SpO2: 95%  94%   Weight:      Height:       Physical Exam Constitutional:      Appearance: He is well-developed. He is obese.  Eyes:     Pupils: Pupils are equal, round, and reactive to light.  Cardiovascular:     Rate and Rhythm: Normal rate. Rhythm irregular.     Pulses: Normal pulses.  Pulmonary:     Effort: Pulmonary effort is normal.  Abdominal:     Palpations: Abdomen is soft.  Musculoskeletal:        General: Normal range of motion.  Skin:    General: Skin is warm.  Neurological:     General: No focal deficit present.     Mental Status: He is alert and oriented to person, place, and time.  Psychiatric:        Mood and Affect: Mood normal.      Data Reviewed:  Results are pending, will review when available.  Family Communication: None by bedside  Disposition: Status is: Observation The patient remains OBS appropriate and will d/c before 2 midnights.  Planned Discharge Destination: Home    Time spent: 31 minutes   Author: Kirstie Peri, MD 07/05/2023 12:25 PM  For on call review [email or web address].

## 2023-07-06 ENCOUNTER — Encounter: Payer: Self-pay | Admitting: Internal Medicine

## 2023-07-06 LAB — COMPREHENSIVE METABOLIC PANEL
ALT: 28 U/L (ref 0–44)
AST: 20 U/L (ref 15–41)
Albumin: 3.5 g/dL (ref 3.5–5.0)
Alkaline Phosphatase: 73 U/L (ref 38–126)
Anion gap: 7 (ref 5–15)
BUN: 22 mg/dL — ABNORMAL HIGH (ref 6–20)
CO2: 26 mmol/L (ref 22–32)
Calcium: 8.7 mg/dL — ABNORMAL LOW (ref 8.9–10.3)
Chloride: 103 mmol/L (ref 98–111)
Creatinine, Ser: 1.35 mg/dL — ABNORMAL HIGH (ref 0.61–1.24)
GFR, Estimated: >60 mL/min (ref >60)
Glucose, Bld: 177 mg/dL — ABNORMAL HIGH (ref 70–99)
Potassium: 4.5 mmol/L (ref 3.5–5.1)
Sodium: 136 mmol/L (ref 135–145)
Total Bilirubin: 0.7 mg/dL (ref 0.0–1.2)
Total Protein: 7.3 g/dL (ref 6.5–8.1)

## 2023-07-06 LAB — CBC
HCT: 34.7 % — ABNORMAL LOW (ref 39.0–52.0)
Hemoglobin: 11.5 g/dL — ABNORMAL LOW (ref 13.0–17.0)
MCH: 29.6 pg (ref 26.0–34.0)
MCHC: 33.1 g/dL (ref 30.0–36.0)
MCV: 89.4 fL (ref 80.0–100.0)
Platelets: 232 10*3/uL (ref 150–400)
RBC: 3.88 MIL/uL — ABNORMAL LOW (ref 4.22–5.81)
RDW: 13 % (ref 11.5–15.5)
WBC: 9 10*3/uL (ref 4.0–10.5)
nRBC: 0 % (ref 0.0–0.2)

## 2023-07-06 LAB — MAGNESIUM: Magnesium: 2.6 mg/dL — ABNORMAL HIGH (ref 1.7–2.4)

## 2023-07-06 LAB — CBG MONITORING, ED
Glucose-Capillary: 171 mg/dL — ABNORMAL HIGH (ref 70–99)
Glucose-Capillary: 224 mg/dL — ABNORMAL HIGH (ref 70–99)

## 2023-07-06 NOTE — Discharge Summary (Signed)
Physician Discharge Summary   Patient: Craig Reese MRN: [id number] DOB: May 30, 1960  Admit date:     07/04/2023  Discharge date: 07/06/23  Discharge Physician: Kirstie Peri   PCP: Katrina Stack, MD   Recommendations at discharge:    Follow up with PCP in 1 week  Follow up with cardiology in 1-2 weeks   Discharge Diagnoses: Principal Problem:   Atypical chest pain Active Problems:   Nonspecific chest pain   Uncontrolled type 2 diabetes mellitus with hyperglycemia, without long-term current use of insulin    GERD (gastroesophageal reflux disease)   Lymphadenopathy of left cervical region   Hypothyroidism   Chronic diastolic CHF (congestive heart failure)    HLD (hyperlipidemia)   HTN (hypertension)   Severe obstructive sleep apnea  Resolved Problems:   * No resolved hospital problems. 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 He saw his interventional cardiology at [facility] 06/11/33 for preop clearance for radical neck dissection 07/08/23 and was deemed stable for surgery. Per note he could walk several blocks and climb two sets of stairs w/o symptoms.   This AM [name] reports the onset of substernal chest pain, which by his report via translator seems to eminate from his left neck. There was no radiation of pain, no diaphoresis, no acute SOB. EMS activated and by EMT report to EDP he had a run of V-tach that resolved spontaneously - no EKG strip was run. He was transported to [facility] for further evaluation.     ED Course: T 98.2  159/92  HR 76  RR 21. Overweight man able to give a good history with the use of a translator, who at interview had continued chest pain which he felt came from his neck to his chest. Lab: Glucose 257, Cr 1.35 (chronid) Troponins 21 to 22. CTA chest - no evidence of aneuryzm or dissection. EKG w/op acute changes. TRH  called to admit for observation evaluation for atypical chest pain.   2/17 : Patient was admitted under observation status.  Throughout the hospital course patient remained asymptomatic with no complaint of substernal chest pain or shortness of breath troponins with no acute elevations.  Remained plateaued at 21-22.  Was vitally stable.  Chest pain was attributed to the left-sided tender lymph node.  Patient is scheduled for surgery for that.  On the day of discharge patient was vitally stable with no active complaint.  Plan of discharge was discussed with patient and Nursing.  Advised to seek emergent help if worsening or status changes.  Assessment and Plan:   Nonspecific chest pain Patient with onset of chest pain this AM. He was last seen by his interventional cardiology 06/12/23 and was cleared for surgery on 07/08/23 w/o need for further cardiac evaluation. Last cardiac cath June '24 showed no change from previous cath. Patient has good exercise tolerance. His pain seems to eminate from the enlarged tender lymph node chain left posterior neck, for which he is schedule to have modified radical dissection. Troponins flat at 21 and 22. EKG with sinus rhythm, RBBB, LAFB with no acute changes.   -Observation tele admit for ruling out ACS or significant arrythmia (by EMT report had a run of V-tach in transit to hospital) -Continue home medical regimen -Continue with aspirin 81 mg daily.  Nitroglycerin as needed for chest pain. -Replete electrolyte keep  potassium above 4 and magnesium above 2.   Lymphadenopathy of left cervical region Patient with enlarged lymphnodes left posterior cervical chain vs mass. Followed in [facility] and he is scheduled for modified radical neck dissection 07/08/23. This are is tender.   APAP 500 mg q6   GERD (gastroesophageal reflux disease) May be contributing to atypical chest pain. Nl abdominal exam   PlanContinue home regimen of PPI and carafate   Uncontrolled  type 2 diabetes mellitus with hyperglycemia, without long-term current use of insulin  Admission serum glucose 257. Last A1C 6.7% Dec '24.   Continue home regimen, including metformin SS coverage AC/HS   Severe obstructive sleep apnea Will allow home CPAP to be brought in tomorrow.   HTN (hypertension) BP mildly elevated, possible secondary to pain.   Continue home medications   HLD (hyperlipidemia)  Chronic problem followed by cardiology and PCP in [place].    Continue home meds   Chronic diastolic CHF (congestive heart failure)  Last ECHO 09/10/22 with EF 55-60%, LVH, Grade II DD. Appears well compensated. Not SOB.   Continue home meds   Hypothyroidism Last TSH 80 04/25/23.   Paroxysmal A-fib : Rate controlled on amiodarone and rivaroxaban for stroke prevention will continue with both.   Add TSH to lab Continue home dose of levothyroxine       Consultants: None  Procedures performed: None   Disposition: Home Diet recommendation:  Discharge Diet Orders (From admission, onward)     Start     Ordered   07/06/23 0000  Diet - low sodium heart healthy        07/06/23 1132           Carb modified diet DISCHARGE MEDICATION: Allergies as of 07/06/2023   No Known Allergies      Medication List     TAKE these medications    amiodarone 200 MG tablet Commonly known as: Pacerone Take 1 tablet (200 mg total) by mouth daily.   aspirin EC 81 MG tablet Tome 1 tableta (81 mg en total) por va oral al da. Trague entero. (Take 1 tablet (81 mg total) by mouth daily. Swallow whole.)   atorvastatin 80 MG tablet Commonly known as: LIPITOR Take 80 mg by mouth every evening.   diclofenac Sodium 1 % Gel Commonly known as: VOLTAREN Apply 2 g topically 4 (four) times daily as needed.   empagliflozin 25 MG Tabs tablet Commonly known as: JARDIANCE Take 1 tablet by mouth daily.   furosemide 40 MG tablet Commonly known as: LASIX Take 1 tablet (40 mg total) by  mouth daily as needed. for up to 3 days for increased leg swelling, shortness of breath, weight gain 5+ lbs over 1-2 days. Seek medical care if these symptoms are not improving with increased dose.   gabapentin 300 MG capsule Commonly known as: NEURONTIN Take 300 mg by mouth 2 (two) times daily.   glimepiride 2 MG tablet Commonly known as: AMARYL Take 1 tablet by mouth daily.   ketoconazole 2 % cream Commonly known as: NIZORAL Apply topically daily.   levothyroxine 150 MCG tablet Commonly known as: SYNTHROID Tome 2 tabletas (300 mcg en total) por va oral diariamente antes de desayunar. (Take 2 tablets (300 mcg total) by mouth daily before breakfast.)   metFORMIN 1000 MG tablet Commonly known as: GLUCOPHAGE Take 1,000 mg by mouth 2 (two) times daily with a meal.   metoprolol tartrate 25 MG tablet Commonly known as: LOPRESSOR Tome 0,5 comprimidos (12,5 mg  en total) por va oral 2 (dos) veces al [employer]. (Take 0.5 tablets (12.5 mg total) by mouth 2 (two) times daily.)   nitroGLYCERIN 0.4 MG SL tablet Commonly known as: NITROSTAT Place 0.4 mg under the tongue every 5 (five) minutes as needed for chest pain.   pantoprazole 40 MG tablet Commonly known as: PROTONIX Take 40 mg by mouth 2 (two) times daily.   potassium chloride SA 20 MEQ tablet Commonly known as: KLOR-CON M Take 1 tablet (20 mEq total) by mouth daily.   rivaroxaban 20 MG Tabs tablet Commonly known as: XARELTO Take 1 tablet by mouth at bedtime.   spironolactone 25 MG tablet Commonly known as: ALDACTONE Take 1 tablet by mouth daily.   sucralfate 1 GM/10ML suspension Commonly known as: CARAFATE Take 1 g by mouth 4 (four) times daily -  with meals and at bedtime.   valsartan 160 MG tablet Commonly known as: DIOVAN Take 1 tablet by mouth daily.        Discharge Exam: Filed Weights   07/04/23 1536  Weight: 125.8 kg   Physical Exam Constitutional:      Appearance: He is well-developed.  Eyes:      Extraocular Movements: Extraocular movements intact.     Pupils: Pupils are equal, round, and reactive to light.  Cardiovascular:     Rate and Rhythm: Normal rate.  Pulmonary:     Effort: Pulmonary effort is normal.  Abdominal:     Palpations: Abdomen is soft.  Musculoskeletal:        General: Normal range of motion.     Cervical back: Normal range of motion.  Skin:    General: Skin is warm.  Neurological:     General: No focal deficit present.     Mental Status: He is alert and oriented to person, place, and time.  Psychiatric:        Mood and Affect: Mood normal.        Behavior: Behavior normal.      Condition at discharge: good  The results of significant diagnostics from this hospitalization (including imaging, microbiology, ancillary and laboratory) are listed below for reference.   Imaging Studies: CT Angio Chest/Abd/Pel for Dissection W and/or Wo Contrast Result Date: 07/04/2023 CLINICAL DATA:  Chest pain EXAM: CT ANGIOGRAPHY CHEST, ABDOMEN AND PELVIS TECHNIQUE: Non-contrast CT of the chest was initially obtained. Multidetector CT imaging through the chest, abdomen and pelvis was performed using the standard protocol during bolus administration of intravenous contrast. Multiplanar reconstructed images and MIPs were obtained and reviewed to evaluate the vascular anatomy. RADIATION DOSE REDUCTION: This exam was performed according to the departmental dose-optimization program which includes automated exposure control, adjustment of the mA and/or kV according to patient size and/or use of iterative reconstruction technique. CONTRAST:  OMNIPAQUE IOHEXOL 350 MG/ML SOLN COMPARISON:  CT chest 10/08/2022 CT AP from 08/11/2013 FINDINGS: CTA CHEST FINDINGS Cardiovascular: Preferential opacification of the thoracic aorta. No evidence of thoracic aortic aneurysm or dissection. Normal heart size. No pericardial effusion. Aortic atherosclerosis. Previous median sternotomy and CABG  procedure. Calcifications of the native coronary arteries. Mediastinum/Nodes: No enlarged mediastinal, hilar, or axillary lymph nodes. Thyroid gland, trachea, and esophagus demonstrate no significant findings. Lungs/Pleura: No pleural effusion, airspace consolidation, atelectasis or pneumothorax. No signs of interstitial edema. Musculoskeletal: Previous median sternotomy. No acute or suspicious osseous findings. Review of the MIP images confirms the above findings. CTA ABDOMEN AND PELVIS FINDINGS VASCULAR Aorta: Normal caliber aorta without aneurysm, dissection, vasculitis or significant stenosis. Aortic  atherosclerotic calcifications. Celiac: Patent without evidence of aneurysm, dissection, vasculitis or significant stenosis. SMA: Patent without evidence of aneurysm, dissection, vasculitis or significant stenosis. Renals: Both renal arteries are patent without evidence of aneurysm, dissection, vasculitis, fibromuscular dysplasia or significant stenosis. IMA: Patent without evidence of aneurysm, dissection, vasculitis or significant stenosis. Inflow: Patent without evidence of aneurysm, dissection, vasculitis or significant stenosis. Veins: No obvious venous abnormality within the limitations of this arterial phase study. Review of the MIP images confirms the above findings. NON-VASCULAR Hepatobiliary: No focal liver abnormality is seen. No gallstones, gallbladder wall thickening, or biliary dilatation. Pancreas: Unremarkable. No pancreatic ductal dilatation or surrounding inflammatory changes. Spleen: Normal in size without focal abnormality. Adrenals/Urinary Tract: Normal adrenal glands. 4 mm stone within the inferior pole of the left kidney, image 178/5. No mass or obstructive uropathy identified. Urinary bladder is unremarkable. Stomach/Bowel: Stomach appears normal. No dilated loops of large or small bowel. No bowel wall thickening or inflammation. Lymphatic: Prominent retroperitoneal lymph nodes are  identified. These measure up to 9 mm. Index left periaortic node measures 0.9 cm, image 193/5. Aortocaval lymph node measures 0.8 cm, image 215/5. Reproductive: No mass identified. Other: No free fluid or fluid collections. Musculoskeletal: No acute or significant osseous findings. Review of the MIP images confirms the above findings. IMPRESSION: 1. No evidence for aortic aneurysm or dissection. 2. Previous median sternotomy and CABG procedure. 3. Nonobstructing left renal calculus. 4. Prominent retroperitoneal lymph nodes are identified. These measure up to 9 mm. These are nonspecific and may be reactive in etiology. 5.  Aortic Atherosclerosis (ICD10-I70.0). Electronically Signed   By: Signa Kell M.D.   On: 07/04/2023 18:06   DG Chest Portable 1 View Result Date: 07/04/2023 CLINICAL DATA:  Chest pain EXAM: PORTABLE CHEST 1 VIEW COMPARISON:  04/25/2023. FINDINGS: Cardiac silhouette appears prominent. No pneumonia or pulmonary edema. No pneumothorax or pleural effusion. Calcified aorta. Status post median sternotomy. Osseous structures appear grossly intact. IMPRESSION: Enlarged cardiac silhouette.  Lungs are clear. Electronically Signed   By: Layla Maw M.D.   On: 07/04/2023 16:18    Microbiology: Results for orders placed or performed during the hospital encounter of 04/25/23  Resp panel by RT-PCR (RSV, Flu A&B, Covid) Anterior Nasal Swab     Status: None   Collection Time: 04/26/23 12:03 AM   Specimen: Anterior Nasal Swab  Result Value Ref Range Status   SARS Coronavirus 2 by RT PCR NEGATIVE NEGATIVE Final    Comment: (NOTE) SARS-CoV-2 target nucleic acids are NOT DETECTED.  The SARS-CoV-2 RNA is generally detectable in upper respiratory specimens during the acute phase of infection. The lowest concentration of SARS-CoV-2 viral copies this assay can detect is 138 copies/mL. A negative result does not preclude SARS-Cov-2 infection and should not be used as the sole basis for treatment  or other patient management decisions. A negative result may occur with  improper specimen collection/handling, submission of specimen other than nasopharyngeal swab, presence of viral mutation(s) within the areas targeted by this assay, and inadequate number of viral copies(<138 copies/mL). A negative result must be combined with clinical observations, patient history, and epidemiological information. The expected result is Negative.  Fact Sheet for Patients:  BloggerCourse.com  Fact Sheet for Healthcare Providers:  SeriousBroker.it  This test is no t yet approved or cleared by the [place] FDA and  has been authorized for detection and/or diagnosis of SARS-CoV-2 by FDA under an Emergency Use Authorization (EUA). This EUA will remain  in effect (meaning this test  can be used) for the duration of the COVID-19 declaration under Section 564(b)(1) of the Act, 21 U.S.C.section 360bbb-3(b)(1), unless the authorization is terminated  or revoked sooner.       Influenza A by PCR NEGATIVE NEGATIVE Final   Influenza B by PCR NEGATIVE NEGATIVE Final    Comment: (NOTE) The Xpert Xpress SARS-CoV-2/FLU/RSV plus assay is intended as an aid in the diagnosis of influenza from Nasopharyngeal swab specimens and should not be used as a sole basis for treatment. Nasal washings and aspirates are unacceptable for Xpert Xpress SARS-CoV-2/FLU/RSV testing.  Fact Sheet for Patients: BloggerCourse.com  Fact Sheet for Healthcare Providers: SeriousBroker.it  This test is not yet approved or cleared by the [place] FDA and has been authorized for detection and/or diagnosis of SARS-CoV-2 by FDA under an Emergency Use Authorization (EUA). This EUA will remain in effect (meaning this test can be used) for the duration of the COVID-19 declaration under Section 564(b)(1) of the Act, 21 U.S.C. section  360bbb-3(b)(1), unless the authorization is terminated or revoked.     Resp Syncytial Virus by PCR NEGATIVE NEGATIVE Final    Comment: (NOTE) Fact Sheet for Patients: BloggerCourse.com  Fact Sheet for Healthcare Providers: SeriousBroker.it  This test is not yet approved or cleared by the [place] FDA and has been authorized for detection and/or diagnosis of SARS-CoV-2 by FDA under an Emergency Use Authorization (EUA). This EUA will remain in effect (meaning this test can be used) for the duration of the COVID-19 declaration under Section 564(b)(1) of the Act, 21 U.S.C. section 360bbb-3(b)(1), unless the authorization is terminated or revoked.  Performed at [facility], [address] [place], [place] [address]   Group A Strep by PCR [facility] Only)     Status: None   Collection Time: 04/26/23 12:03 AM   Specimen: Anterior Nasal Swab; Sterile Swab  Result Value Ref Range Status   Group A Strep by PCR NOT DETECTED NOT DETECTED Final    Comment: Performed at [facility], [address] [place], [place] [address]  Group A Strep by PCR     Status: None   Collection Time: 04/26/23 11:30 AM  Result Value Ref Range Status   Group A Strep by PCR NOT DETECTED NOT DETECTED Final    Comment: Performed at [facility], [address] [place], [place] [address]    Labs: CBC: Recent Labs  Lab 07/04/23 1541 07/06/23 0601  WBC 11.6* 9.0  HGB 13.2 11.5*  HCT 39.6 34.7*  MCV 87.0 89.4  PLT 282 232   Basic Metabolic Panel: Recent Labs  Lab 07/04/23 1541 07/05/23 0116 07/06/23 0601  NA 136  --  136  K 4.0  --  4.5  CL 98  --  103  CO2 23  --  26  GLUCOSE 257*  --  177*  BUN 16  --  22*  CREATININE 1.35*  --  1.35*  CALCIUM 9.1  --  8.7*  MG  --  1.9 2.6*   Liver Function Tests: Recent Labs  Lab 07/06/23 0601  AST 20  ALT 28  ALKPHOS 73  BILITOT 0.7  PROT 7.3  ALBUMIN 3.5    CBG: Recent Labs  Lab 07/05/23 1159 07/05/23 1653 07/05/23 2244 07/06/23 0729 07/06/23 1100  GLUCAP 168* 108* 211* 171* 224*    Discharge time spent: greater than 30 minutes.  Signed: Kirstie Peri, MD [facility] 07/06/2023
